# Patient Record
Sex: Male | Born: 1935 | Race: White | Hispanic: No | Marital: Married | Smoking: Former smoker
Health system: Southern US, Community
[De-identification: ages and names within clinical notes are randomized; demographics above are authoritative.]

## PROBLEM LIST (undated history)

## (undated) DIAGNOSIS — I499 Cardiac arrhythmia, unspecified: Secondary | ICD-10-CM

## (undated) DIAGNOSIS — H3530 Unspecified macular degeneration: Secondary | ICD-10-CM

## (undated) DIAGNOSIS — E1140 Type 2 diabetes mellitus with diabetic neuropathy, unspecified: Secondary | ICD-10-CM

## (undated) DIAGNOSIS — G250 Essential tremor: Secondary | ICD-10-CM

## (undated) DIAGNOSIS — M5136 Other intervertebral disc degeneration, lumbar region: Secondary | ICD-10-CM

## (undated) DIAGNOSIS — I639 Cerebral infarction, unspecified: Secondary | ICD-10-CM

## (undated) DIAGNOSIS — K5790 Diverticulosis of intestine, part unspecified, without perforation or abscess without bleeding: Secondary | ICD-10-CM

## (undated) DIAGNOSIS — K5909 Other constipation: Secondary | ICD-10-CM

## (undated) DIAGNOSIS — G464 Cerebellar stroke syndrome: Secondary | ICD-10-CM

## (undated) DIAGNOSIS — E782 Mixed hyperlipidemia: Secondary | ICD-10-CM

## (undated) DIAGNOSIS — E119 Type 2 diabetes mellitus without complications: Secondary | ICD-10-CM

## (undated) DIAGNOSIS — N400 Enlarged prostate without lower urinary tract symptoms: Secondary | ICD-10-CM

## (undated) DIAGNOSIS — I872 Venous insufficiency (chronic) (peripheral): Secondary | ICD-10-CM

## (undated) DIAGNOSIS — I509 Heart failure, unspecified: Secondary | ICD-10-CM

## (undated) DIAGNOSIS — A0472 Enterocolitis due to Clostridium difficile, not specified as recurrent: Secondary | ICD-10-CM

## (undated) DIAGNOSIS — I10 Essential (primary) hypertension: Secondary | ICD-10-CM

## (undated) HISTORY — DX: Other constipation: K59.09

## (undated) HISTORY — DX: Essential (primary) hypertension: I10

## (undated) HISTORY — DX: Venous insufficiency (chronic) (peripheral): I87.2

## (undated) HISTORY — DX: Mixed hyperlipidemia: E78.2

## (undated) HISTORY — DX: Type 2 diabetes mellitus without complications: E11.9

## (undated) HISTORY — DX: Diverticulosis of intestine, part unspecified, without perforation or abscess without bleeding: K57.90

## (undated) HISTORY — PX: APPENDECTOMY: SHX54

## (undated) HISTORY — PX: HEMORRHOID SURGERY: SHX153

## (undated) HISTORY — PX: COLONOSCOPY: SHX174

## (undated) HISTORY — DX: Unspecified macular degeneration: H35.30

## (undated) HISTORY — DX: Enterocolitis due to Clostridium difficile, not specified as recurrent: A04.72

## (undated) HISTORY — DX: Type 2 diabetes mellitus with diabetic neuropathy, unspecified: E11.40

## (undated) HISTORY — DX: Essential tremor: G25.0

## (undated) HISTORY — PX: HERNIA REPAIR: SHX51

## (undated) HISTORY — PX: SIGMOIDOSCOPY: SUR1295

---

## 2002-09-20 ENCOUNTER — Other Ambulatory Visit: Admission: RE | Admit: 2002-09-20 | Discharge: 2002-09-20 | Payer: Self-pay | Admitting: Dermatology

## 2002-12-26 ENCOUNTER — Other Ambulatory Visit: Admission: RE | Admit: 2002-12-26 | Discharge: 2002-12-26 | Payer: Self-pay | Admitting: Dermatology

## 2003-10-02 ENCOUNTER — Other Ambulatory Visit: Admission: RE | Admit: 2003-10-02 | Discharge: 2003-10-02 | Payer: Self-pay | Admitting: Dermatology

## 2005-01-07 ENCOUNTER — Other Ambulatory Visit: Admission: RE | Admit: 2005-01-07 | Discharge: 2005-01-07 | Payer: Self-pay | Admitting: Dermatology

## 2007-10-11 ENCOUNTER — Ambulatory Visit (HOSPITAL_COMMUNITY): Admission: RE | Admit: 2007-10-11 | Discharge: 2007-10-11 | Payer: Self-pay | Admitting: Family Medicine

## 2007-11-21 ENCOUNTER — Ambulatory Visit: Payer: Self-pay | Admitting: Gastroenterology

## 2007-11-23 ENCOUNTER — Ambulatory Visit (HOSPITAL_COMMUNITY): Admission: RE | Admit: 2007-11-23 | Discharge: 2007-11-23 | Payer: Self-pay | Admitting: Gastroenterology

## 2007-12-21 ENCOUNTER — Ambulatory Visit: Payer: Self-pay | Admitting: Gastroenterology

## 2007-12-28 ENCOUNTER — Ambulatory Visit (HOSPITAL_COMMUNITY): Admission: RE | Admit: 2007-12-28 | Discharge: 2007-12-28 | Payer: Self-pay | Admitting: Family Medicine

## 2008-01-19 ENCOUNTER — Ambulatory Visit: Payer: Self-pay | Admitting: Gastroenterology

## 2008-12-16 DIAGNOSIS — A0472 Enterocolitis due to Clostridium difficile, not specified as recurrent: Secondary | ICD-10-CM

## 2008-12-16 HISTORY — DX: Enterocolitis due to Clostridium difficile, not specified as recurrent: A04.72

## 2008-12-26 ENCOUNTER — Ambulatory Visit: Payer: Self-pay | Admitting: Gastroenterology

## 2009-01-02 ENCOUNTER — Ambulatory Visit: Payer: Self-pay | Admitting: Gastroenterology

## 2009-01-02 ENCOUNTER — Encounter: Payer: Self-pay | Admitting: Gastroenterology

## 2009-01-02 ENCOUNTER — Ambulatory Visit (HOSPITAL_COMMUNITY): Admission: RE | Admit: 2009-01-02 | Discharge: 2009-01-02 | Payer: Self-pay | Admitting: Gastroenterology

## 2009-01-03 ENCOUNTER — Encounter: Payer: Self-pay | Admitting: Gastroenterology

## 2009-01-03 ENCOUNTER — Telehealth (INDEPENDENT_AMBULATORY_CARE_PROVIDER_SITE_OTHER): Payer: Self-pay

## 2009-01-17 ENCOUNTER — Encounter: Payer: Self-pay | Admitting: Gastroenterology

## 2009-02-10 ENCOUNTER — Encounter: Payer: Self-pay | Admitting: Gastroenterology

## 2009-03-18 ENCOUNTER — Ambulatory Visit: Payer: Self-pay | Admitting: Gastroenterology

## 2009-08-27 ENCOUNTER — Ambulatory Visit: Payer: Self-pay | Admitting: Gastroenterology

## 2010-01-13 ENCOUNTER — Ambulatory Visit: Payer: Self-pay | Admitting: Gastroenterology

## 2010-01-14 ENCOUNTER — Encounter: Payer: Self-pay | Admitting: Gastroenterology

## 2010-01-21 ENCOUNTER — Encounter (HOSPITAL_COMMUNITY): Admission: RE | Admit: 2010-01-21 | Discharge: 2010-02-20 | Payer: Self-pay | Admitting: Gastroenterology

## 2010-01-27 ENCOUNTER — Ambulatory Visit (HOSPITAL_COMMUNITY): Admission: RE | Admit: 2010-01-27 | Discharge: 2010-01-27 | Payer: Self-pay | Admitting: Gastroenterology

## 2010-01-27 ENCOUNTER — Ambulatory Visit: Payer: Self-pay | Admitting: Gastroenterology

## 2010-02-24 ENCOUNTER — Ambulatory Visit: Payer: Self-pay | Admitting: Gastroenterology

## 2010-02-24 ENCOUNTER — Encounter (INDEPENDENT_AMBULATORY_CARE_PROVIDER_SITE_OTHER): Payer: Self-pay | Admitting: *Deleted

## 2010-03-03 ENCOUNTER — Encounter: Payer: Self-pay | Admitting: Gastroenterology

## 2010-10-26 ENCOUNTER — Encounter: Payer: Self-pay | Admitting: Gastroenterology

## 2010-11-04 ENCOUNTER — Ambulatory Visit: Payer: Self-pay | Admitting: Internal Medicine

## 2010-12-17 NOTE — Assessment & Plan Note (Signed)
Summary: DIFFICULTY PASSING STOOL, RECTAL BLEEDING   Visit Type:  Follow-up Visit Primary Care Provider:  Simone Curia  Chief Complaint:  abd pain and blood in stool.  History of Present Illness: Pt initially seen JAN 2009 for long standing constipation, which was more difficult to manage with Metamucil, prunes, and laxatives. CMP/CBC NL in 2008. CTA/P w/ivc-DIVERTICULOSIS, nl stool volume. TSH NL 2009. Single contrast BE 2009-SIGMOID REDUNDANCY, AND DIVERTICULOSIS. Pt declined TCS. Added Benefiber and miralax, no change. Tried Amitiza 8 two times a day, no change. Increased to 24 two times a day, no change. 2010: Pt agreed to flex sig/? TCS-diverticulosis/?colitis. Biopsies showed colitis. CDIFF pos AFTER BEING ON CEFTIN, routine stool Cx neg. Rx for CDIFF-FLAG x 10 days.   Today pain in belly and strains and has bleeding in stool with BMs. Does feel hemorrhoids coming out. No pain in belly right now. Can have pains intermittently on the left or right. Stool soft but it just won't drop down. Causes him to feel nauseous and to have indigestion. Taking Miralax and benefiber three times a day.  Current Medications (verified): 1)  Polyethylene Glycol 3350  Powd (Polyethylene Glycol 3350) .Marland KitchenMarland KitchenMarland Kitchen 17 Grams By Mouth Tid 2)  Lovastatin 40 Mg Tabs (Lovastatin) .... Once Daily 3)  Allopurinol 300 Mg Tabs (Allopurinol) .... Once Daily 4)  Furosemide 40 Mg Tabs (Furosemide) .... Once Daily 5)  Atenolol 50 Mg Tabs (Atenolol) .... Once Daily 6)  Plavix 75 Mg Tabs (Clopidogrel Bisulfate) .... Once Daily 7)  Etodolac 300 Mg Caps (Etodolac) .... Once Daily 8)  Benefiber  Powd (Wheat Dextrin) .... 2 Tsp Po Tid 9)  Prilosec 20 Mg Cpdr (Omeprazole) .Marland Kitchen.. 1 By Mouth Every Morning 10)  Asa 81 Mg .... Take 1 Tablet By Mouth Once A Day 11)  Terazosin Hcl 5 Mg Caps (Terazosin Hcl) .... Take 1 Tablet By Mouth Once A Day 12)  I-Caps .... Two Tablets Daily  Allergies (verified): No Known Drug Allergies  Past  History:  Past Medical History: Last updated: 03/18/2009 Diverticulosis Hyperlipidemia Gout Constipation  Past Surgical History: Last updated: 08/26/2009 Hernia repair Hemorrhoidectomy Appendectomy  Vital Signs:  Patient profile:   77 year old male Height:      75 inches Weight:      199 pounds Temp:     97.7 degrees F oral Pulse rate:   72 / minute BP sitting:   166 / 78  (left arm) Cuff size:   regular  Vitals Entered By: Cloria Spring LPN (January 14, 6432 4:00 PM)  Physical Exam  General:  Well developed, well nourished, no acute distress. Head:  Normocephalic and atraumatic. Eyes:  PERRLA, no icterus. Mouth:  No deformity or lesions. Neck:  Supple; no masses. Lungs:  Clear throughout to auscultation. Heart:  Regular rate and rhythm; no murmurs, rubs,  or bruits.  Impression & Recommendations:  Problem # 1:  CONSTIPATION (ICD-564.00)  Differential diagnosis includes pelvic floor dysfunction, or rectal prolapse, less likely sigmoid volvulus. Flex SIG MAR 15. WILL CHECK FOR RECTAL PROLAPSE AT THAT TIME. Need SITZ MARKER study and ARM/BET. May need biofeedback. Continue Benefiber and Miralax three times a day. Needs flex sig for rectal bleeding. OPV in 6 weeks.  cc: pcp  Orders: Est. Patient Level V (29518)  Patient Instructions: 1)  Continue Miralax and Benefiber three times a day. 2)  Lower endoscopy at [facility] on MAR 15. 3)  Will obtain Sitz Marker Study at [facility]. 4)  Obtain Anorectal manometry to measure  anal relaxation at [facility]. 5)  Return visit in 6 weeks. 6)  The medication list was reviewed and reconciled.  All changed / newly prescribed medications were explained.  A complete medication list was provided to the patient / caregiver.

## 2010-12-17 NOTE — Assessment & Plan Note (Signed)
Summary: FU/HAVING TROUBLE GOING TO THE BATHROOM/SS   Visit Type:  Follow-up Visit Primary Care Provider:  Lubertha South  CC:  F/U constipaton.  History of Present Illness: [name] presents today in f/u for chronic constipation. He has had a thorough work-up including recent colonoscopy in March 2011, sitz marker study showing normal colonic transit, and anorectal manometry at [facility] with recommendations to pursue biofeedback. Pt is not interested in this at this time. He reports that he occasionally feels bloated, has 4-5 very small BMs daily, about a "spoonful", has to strain. Uses newspaper to relax which helps. He is using fiber and miralax three times/day. He was on Amitiza in the past, but this has not been filled since February. He is unsure why he stopped this. He denies any other issues at this time. Still not happy with BMs.  Current Medications (verified): 1)  Polyethylene Glycol 3350  Powd (Polyethylene Glycol 3350) .Marland KitchenMarland KitchenMarland Kitchen 17 Grams By Mouth Tid 2)  Lovastatin 40 Mg Tabs (Lovastatin) .... Once Daily 3)  Allopurinol 300 Mg Tabs (Allopurinol) .... Once Daily 4)  Furosemide 40 Mg Tabs (Furosemide) .... Once Daily 5)  Atenolol 50 Mg Tabs (Atenolol) .... Once Daily 6)  Plavix 75 Mg Tabs (Clopidogrel Bisulfate) .... Once Daily 7)  Etodolac 300 Mg Caps (Etodolac) .... Twice Daily 8)  Benefiber  Powd (Wheat Dextrin) .... 2 Tsp Po Tid 9)  Asa 81 Mg .... Take 1 Tablet By Mouth Once A Day 10)  Terazosin Hcl 5 Mg Caps (Terazosin Hcl) .... Take 1 Tablet By Mouth Once A Day 11)  I-Caps .... Two Tablets Daily 12)  Amitiza 24 Mcg Caps (Lubiprostone) .... Take 1 Tablet By Mouth Once A Day  Allergies (verified): No Known Drug Allergies  Past History:  Past Medical History: Last updated: 03/18/2009 Diverticulosis Hyperlipidemia Gout Constipation  Past Surgical History: Last updated: 08/26/2009 Hernia repair Hemorrhoidectomy Appendectomy  Review of Systems General:  Denies fever,  chills, and anorexia. Eyes:  Denies blurring, irritation, and discharge. ENT:  Denies sore throat, hoarseness, and difficulty swallowing. CV:  Denies chest pains and syncope. Resp:  Denies dyspnea at rest and wheezing. GI:  Complains of constipation; denies difficulty swallowing, pain on swallowing, nausea, indigestion/heartburn, abdominal pain, change in bowel habits, bloody BM's, and black BMs. GU:  Denies urinary burning and urinary frequency. MS:  Denies joint pain / LOM, joint swelling, and joint stiffness. Derm:  Denies rash, itching, and dry skin. Neuro:  Denies weakness and syncope. Psych:  Denies depression and anxiety.  Vital Signs:  Patient profile:   77 year old male Height:      75 inches Weight:      195 pounds BMI:     24.46 Temp:     98.7 degrees F oral Pulse rate:   68 / minute BP sitting:   170 / 78  (left arm) Cuff size:   large  Vitals Entered By: Cloria Spring LPN (November 04, 2010 1:56 PM)  Physical Exam  General:  Well developed, well nourished, no acute distress. Head:  Normocephalic and atraumatic. Eyes:  sclera without icterus Mouth:  No deformity or lesions, dentition normal. Lungs:  Clear throughout to auscultation. Heart:  Regular rate and rhythm; no murmurs, rubs,  or bruits. Abdomen:  soft, +BS, NT, ND, no HSM Msk:  Symmetrical with no gross deformities. Normal posture. Neurologic:  Alert and  oriented x4;  grossly normal neurologically.  Impression & Recommendations:  Problem # 1:  CONSTIPATION (ICD-72.25)  77 year old pleasant  male with chronic constipation, has undergone thorough work-up including referral to [facility] for anorectal manometry. Recommendations were for biofeedback, which pt is not interested in at this time. Was on Amitiza in past. Willing to try again.    Amitiza by mouth two times a day, instructed to take with food, #14 samples given. Informed caregiver on instructions. Will give progress report in a few weeks. Will  call in rx if this seems to help Return in 3 mos for reassessment.  Orders: Est. Patient Level II ([address])  Appended Document: FU/HAVING TROUBLE GOING TO THE BATHROOM/SS can we have a progress report on pt regarding bowel movements. Gave samples of amitiza twice/day.   Appended Document: FU/HAVING TROUBLE GOING TO THE BATHROOM/SS Called pt's neice, [name]. (Pt is hard of hearing). She is going to check with him and let me kow.  Appended Document: FU/HAVING TROUBLE GOING TO THE BATHROOM/SS Per neice, [name], pt said the Amitiza helped alot. He even had to back off some, but it definitely helped. He would like to get Rx sent to [facility] and have it delivered.   Appended Document: FU/HAVING TROUBLE GOING TO THE BATHROOM/SS    Prescriptions: AMITIZA 8 MCG CAPS (LUBIPROSTONE) take 1 by mouth two times a day with food  #60 x 3   Entered and Authorized by:   Gerrit Halls NP   Signed by:   Gerrit Halls NP on 11/30/2010   Method used:   Faxed to ...       [facility]* (retail)       [address]       [place], [place]  [address]       Ph: [phone]       Fax: [phone]   RxID:   [id number]

## 2010-12-17 NOTE — Assessment & Plan Note (Signed)
Summary: CONSTIPATION,RECTAL BLEEDING./GU   Visit Type:  Follow-up Visit Primary Care Provider:  Lubertha South  Chief Complaint:  constipation/rectal bleeding.  History of Present Illness: Patient is here for f/u. He had anorectal manometry last week at [facility] but we do not have the results yet. He is currently taking Miralax and Benefiber three times a day. He has several small soft BMs per day. He states he continues to have to strain. Denies brbpr. He is still dissatisfied with his bowel function. Denies abd pain, anorexia, heartburn, n/v, melena, diarrhea.  Recent Flex sig in 3/11. No rectal prolapse. Weak anal sphincter. Retained stool. Frequent sigmoid colon and descending colon diverticula seen. Unable to appreciate any masses.  Polyps less than 1 cm may have been missed.  No inflammatory changes. Small-to-moderate internal hemorrhoids.  Otherwise, normal retroflex view of the rectum.  He had normal sitz marker study showing normal colon transit.  Current Medications (verified): 1)  Polyethylene Glycol 3350  Powd (Polyethylene Glycol 3350) .Marland KitchenMarland KitchenMarland Kitchen 17 Grams By Mouth Tid 2)  Lovastatin 40 Mg Tabs (Lovastatin) .... Once Daily 3)  Allopurinol 300 Mg Tabs (Allopurinol) .... Once Daily 4)  Furosemide 40 Mg Tabs (Furosemide) .... Once Daily 5)  Atenolol 50 Mg Tabs (Atenolol) .... Once Daily 6)  Plavix 75 Mg Tabs (Clopidogrel Bisulfate) .... Once Daily 7)  Etodolac 300 Mg Caps (Etodolac) .... Twice Daily 8)  Benefiber  Powd (Wheat Dextrin) .... 2 Tsp Po Tid 9)  Asa 81 Mg .... Take 1 Tablet By Mouth Once A Day 10)  Terazosin Hcl 5 Mg Caps (Terazosin Hcl) .... Take 1 Tablet By Mouth Once A Day 11)  I-Caps .... Two Tablets Daily  Allergies (verified): No Known Drug Allergies  Review of Systems      See HPI  Vital Signs:  Patient profile:   77 year old male Weight:      198 pounds Temp:     97.7 degrees F oral Pulse rate:   64 / minute BP sitting:   122 / 60  Vitals Entered By: Leanna Battles. Nyair Depaulo PA-C (February 24, 2010 11:29 AM)  Physical Exam  General:  Well developed, well nourished, no acute distress. Head:  Normocephalic and atraumatic. Eyes:  Sclera nonicteric. Mouth:  OP moist. Lungs:  Clear throughout to auscultation. Heart:  Regular rate and rhythm; no murmurs, rubs,  or bruits. Abdomen:  Bowel sounds normal.  Abdomen is soft, nontender, nondistended.  No rebound or guarding.  No hepatosplenomegaly, masses or hernias.  No abdominal bruits.  Extremities:  No clubbing, cyanosis, edema or deformities noted. Neurologic:  Alert and  oriented x4;  grossly normal neurologically. Skin:  Intact without significant lesions or rashes. Psych:  Alert and cooperative. Normal mood and affect.  Impression & Recommendations:  Problem # 1:  CONSTIPATION (ICD-564.00)  W/U as outlined above. Await anorectal manometry study results. Continue Benefiber and Miralax three times a day.  OV with Dr. Darrick Penna in three months. Further recommendations to follow after pending results reviewed.  Patient asked that we call his niece, Ander Slade, with results given he is hard of hearing. Phone number listed under patient demographics.  Orders: Est. Patient Level II ([address]38)    Appended Document: CONSTIPATION,RECTAL BLEEDING./GU Spoke with pt and his niece: [phone]. Pt would like to hold off on biofeedback for now. OPV JUL 15. Will readdress BMs at that time.

## 2010-12-17 NOTE — Letter (Signed)
Summary: SITZ MARKER STUDY ORDER  SITZ MARKER STUDY ORDER   Imported By: Ave Filter 01/13/2010 17:00:15  _____________________________________________________________________  External Attachment:    Type:   Image     Comment:   External Document

## 2010-12-17 NOTE — Letter (Signed)
Summary: FLEX SIG ORDER  FLEX SIG ORDER   Imported By: Ave Filter 01/13/2010 16:59:24  _____________________________________________________________________  External Attachment:    Type:   Image     Comment:   External Document

## 2010-12-17 NOTE — Letter (Signed)
Summary: [facility] REFERRAL  [facility] REFERRAL   Imported By: Ave Filter 01/14/2010 10:39:32  _____________________________________________________________________  External Attachment:    Type:   Image     Comment:   External Document

## 2010-12-17 NOTE — Letter (Signed)
Summary: ANORECTAL MANOMETRY-[facility]  ANORECTAL MANOMETRY-[facility]   Imported By: Ave Filter 03/03/2010 14:48:10  _____________________________________________________________________  External Attachment:    Type:   Image     Comment:   External Document

## 2010-12-17 NOTE — Medication Information (Signed)
Summary: POLYETHYLENE GLYCOL  POLYETHYLENE GLYCOL   Imported By: Rexene Alberts 12020/05/1410 09:57:51  _____________________________________________________________________  External Attachment:    Type:   Image     Comment:   External Document  Appended Document: POLYETHYLENE GLYCOL    Prescriptions: POLYETHYLENE GLYCOL 3350  POWD (POLYETHYLENE GLYCOL 3350) 17 grams by mouth tid  #1 month x 1   Entered and Authorized by:   Gerrit Halls NP   Signed by:   Gerrit Halls NP on 12020/05/1410   Method used:   Faxed to ...       [facility] (retail)       [address]       [place], [place]  [address]       Ph: [phone]       Fax: [phone]   RxID:   [id number]    Please have pt follow-up in office. Was supposed to be seen in July. Gave him one refill for now, needs office visit.   Appended Document: POLYETHYLENE GLYCOL Called and LMOM for pt's neice, [name], to call and schedule an appt.

## 2010-12-17 NOTE — Letter (Signed)
Summary: Scheduled Appointment  [facility]  [address]   [place], [place] [address]   Phone: [phone]  Fax: [phone]    February 24, 2010   Dear: Shella Maxim            DOB: [birth year]    I have been instructed to schedule you an appointment in our office.  Your appointment is as follows:   Date:  May 27, 2010   Time:   9:30AM    Please be here 15 minutes early.   Provider:   DR FIELDS    Please contact the office if you need to reschedule this appointment for a more convenient time.   Thank you,    Diana Eves       [facility] Ph: [phone]   Fax: [phone]

## 2011-01-21 ENCOUNTER — Encounter (INDEPENDENT_AMBULATORY_CARE_PROVIDER_SITE_OTHER): Payer: Self-pay | Admitting: *Deleted

## 2011-01-26 NOTE — Letter (Signed)
Summary: Recall Office Visit  [facility]  [address]   [name], [place] [address]   Phone: [phone]  Fax: [phone]      January 21, 2011   Christian Thomas [address] [place], [place]  [address] 10/04/1915   Dear Christian Thomas,   According to our records, it is time for you to schedule a follow-up office visit with [place].   At your convenience, please call [phone] to schedule an office visit. If you have any questions, concerns, or feel that this letter is in error, we would appreciate your call.   Sincerely,    Diana Eves  [facility] Ph: [phone]   Fax: [phone]

## 2011-03-02 LAB — CLOSTRIDIUM DIFFICILE EIA

## 2011-03-02 LAB — STOOL CULTURE

## 2011-03-11 ENCOUNTER — Encounter: Payer: Self-pay | Admitting: Gastroenterology

## 2011-03-11 ENCOUNTER — Ambulatory Visit: Payer: Self-pay | Admitting: Gastroenterology

## 2011-03-11 ENCOUNTER — Ambulatory Visit (INDEPENDENT_AMBULATORY_CARE_PROVIDER_SITE_OTHER): Payer: Medicare Other | Admitting: Gastroenterology

## 2011-03-11 DIAGNOSIS — K5900 Constipation, unspecified: Secondary | ICD-10-CM

## 2011-03-11 MED ORDER — LUBIPROSTONE 8 MCG PO CAPS: 8.0000 ug | ORAL_CAPSULE | Freq: Two times a day (BID) | ORAL | Status: DC

## 2011-03-11 NOTE — Progress Notes (Signed)
  Subjective:    Patient ID: Christian Thomas, male    DOB: 25-May-1915, 77 y.o.   MRN: [id number]  PCP: Gerda Diss  HPI  Bowels been doing fair. Wants to know if he can take it with his coffee. Puts 2 of medicines in his coffee. Amitiza does a better if he takes it with coffee.   Past Medical History  Diagnosis Date  . Diverticulosis   . Hyperlipemia   . Gout   . C. difficile colitis FEB 2010  . Chronic constipation DECLINED BIOFEEDBACK    SITZ MARKER NL;  PF DYSFUNCTION- ARM/BET   Past Surgical History  Procedure Date  . Hernia repair   . Hemorrhoid surgery   . Appendectomy   . Colonoscopy FEB 2010    NL TI, pan-TICS, sml IH, CDIFF COLITIS  . Sigmoidoscopy MAR 2011 BRBPR    IH       Review of Systems APR 2011 198 LBS    Objective:   Physical Exam  Constitutional: He appears well-developed and well-nourished. No distress.  Cardiovascular: Normal rate and regular rhythm.   Pulmonary/Chest: Effort normal and breath sounds normal.  Abdominal: Soft. Bowel sounds are normal. There is no tenderness.          Assessment & Plan:

## 2011-03-17 ENCOUNTER — Encounter: Payer: Self-pay | Admitting: Gastroenterology

## 2011-03-17 NOTE — Assessment & Plan Note (Signed)
IMPROVED.  CONTINUE AMITIZA. OPV IN 6 MOS.

## 2011-03-18 NOTE — Progress Notes (Signed)
Pt is aware of his OV on 10/25 at 0845 with [facility]

## 2011-03-18 NOTE — Progress Notes (Signed)
Cc to PCP 

## 2011-03-19 ENCOUNTER — Other Ambulatory Visit: Payer: Self-pay | Admitting: Gastroenterology

## 2011-03-30 NOTE — Consult Note (Signed)
Christian Thomas, Christian Thomas               ACCOUNT NO.:  [id number]   MEDICAL RECORD NO.:  [id number]         PATIENT TYPE:  PAMB   LOCATION:  DAY                           FACILITY:  [facility]   PHYSICIAN:  Kassie Mends, M.D.           DATE OF BIRTH:   DATE OF CONSULTATION:  12/26/2008  DATE OF DISCHARGE:                                 CONSULTATION   REFERRING Zoe Goonan:  Lorin Picket A. Gerda Diss, MD   PROBLEM LIST:  1. Constipation with rectal urgency causing frequent solid stools and      altered bowel habits.  2. Diverticulosis, severe.  3. Gout.  4. Hyperlipidemia.  5. Tremors.  6. Hernia.   SUBJECTIVE:  Christian Thomas is a 77 year old male who was last seen in March  2009.  He was taking MiraLax and Benefiber twice daily.  He declined a  sigmoidoscopy at that time.  He was seen in followup and still having  difficulty with his bowel movements.  He denies any abdominal pain,  bright red blood per rectum, and his appetite is good.  He has gained 7  pounds since March 2007.   MEDICATIONS:  1. Terazosin.  2. Lovastatin.  3. Allopurinol 300 mg daily.  4. Lasix  5. Atenolol.  6. MiraLax twice a day.  7. Tylenol and Aleve as needed.  8. Plavix 75 mg daily.  9. Etodolac 300 mg daily.  10.Keflex 500 mg twice a day.  11.ICaps twice daily or 2 daily.   OBJECTIVE:  VITAL SIGNS:  Weight 201 pounds, height 6 feet 3 inches, BMI  25.1 (slightly overweight), temperature 97.6, blood pressure 120/78, and  pulse 16.  GENERAL:  He is in no apparent distress, alert, and oriented x4.HEENT:  Atraumatic and normocephalic.  Mouth, no oral lesions.  Posterior  pharynx without erythema or exudate.NECK:  Full range of motion.  No  lymphadenopathy.LUNGS:  Clear to auscultation  bilaterally.CARDIOVASCULAR:  Regular rhythm.  No murmur.  Normal S1 and  S2.ABDOMEN:  Bowel sounds are present.  Soft, nontender, and  nondistended.  No rebound or guarding.NEUROLOGIC:  No gross neurologic  deficits.   ASSESSMENT:  Mr.  Thomas is a 77 year old male who had a barium enema in  January 2009, that showed severe diverticular changes and sigmoid  redundancy with overlap of opacified bowel limiting the evaluation of  the sigmoid colon.  He declined a sigmoidoscopy at that time, but he was  willing to have the procedure done at this time.  Thank you for allowing  me to see Christian Thomas in consultation.  My recommendations follow.   RECOMMENDATIONS:  1. Will schedule a sigmoidoscopy next week with clear liquids      beginning with lunch and Fleet enema prep.  2. Will add Amitiza 24 mcg b.i.d.  3. He was cautioned that the addition of Amitiza could cause loose      stools.  If that occurs he should decrease his MiraLax.  4. He has a followup appointment to see me in 2 months.  5. If adenomatous polyps are seen on Christian Thomas sigmoidoscopy, then  he      will need a complete colonoscopy.  The sigmoidoscopy will be      performed with the pediatric colonoscope.      Kassie Mends, M.D.  Electronically Signed     SM/MEDQ  D:  12/26/2008  T:  12/26/2008  Job:  [id number]   cc:   Lorin Picket A. Gerda Diss, MD  Fax: [phone]

## 2011-03-30 NOTE — Assessment & Plan Note (Signed)
Christian Thomas, Christian Thomas                  CHART#:  [id number]   DATE:  01/19/2008                       DOB:  02-25-1915   REFERRING Savior Himebaugh:  Lorin Picket A. Gerda Diss, M.D.   PROBLEM LIST:  1. Long-standing history of constipation with rectal urgency causing      frequent solid stools.  2. Diverticulosis likely contributing to number one.  3. Gout.  4. Hyperlipidemia.  5. Tremors.  6. Hernia.   SUBJECTIVE:  Christian Thomas is a 77 year old who presents as a return  patient visit.  Christian Thomas is still not moving his bowels right.  Christian Thomas is using  MiraLax twice a day and Benefiber twice a day.  Christian Thomas is still having to  strain to get the stool out several times a day.  Christian Thomas does not have a lot  of stool that comes out each time.  The stools are soft.  Christian Thomas has not  seen any blood.  Christian Thomas has a bowel movement three times a day.  Two to  three more time Christian Thomas will have the urge to go to the bathroom but nothing  happens.  Christian Thomas denies any abdominal pain.  Christian Thomas had a light stroke since I  last saw him.   OBJECTIVE:  VITAL SIGNS:  Weight 194 pounds (down 6 pounds since  February 2009), height 6 feet 3 inches, temperature 97.8, blood pressure  142/80, pulse 90.  GENERAL:  Christian Thomas is in no apparent distress.  Alert and oriented x4.  LUNGS:  Clear to auscultation bilaterally.CARDIOVASCULAR:  Regular  rhythm.ABDOMEN:  Bowel sounds are present.  Soft, nontender,  nondistended.   ASSESSMENT:  Christian Thomas is a 77 year old male who has a long-standing  history of constipation and now has rectal urgency which is likely  secondary to a redundant sigmoid colon and multiple diverticula. Thank  you for allowing me to see Christian Thomas in consultation.  My  recommendations follow.   RECOMMENDATIONS:  1. We will add Amitiza 8 mcg twice a day.  Christian Thomas was given #6 samples and      asked to call me if Christian Thomas feels like the Amitiza is helping with his      symptoms.  2. Christian Thomas should continue the MiraLax and the Benefiber twice a day.  3. I discussed the  benefits versus the risks of flexible sigmoidoscopy      with Christian Thomas to investigate his sigmoid colon.  Christian Thomas has declined      for right now.  4. Christian Thomas has a follow up appointment to see me in 6 months or sooner if      needed.  5. Christian Thomas is given his discharge instructions in writing.       Kassie Mends, M.D.  Electronically Signed     SM/MEDQ  D:  01/22/2008  T:  01/22/2008  Job:  [id number]   cc:   Lorin Picket A. Gerda Diss, MD

## 2011-03-30 NOTE — Op Note (Signed)
NAMEBURNETTE, SAUTTER               ACCOUNT NO.:  [id number]   MEDICAL RECORD NO.:  [id number]          PATIENT TYPE:  AMB   LOCATION:  DAY                           FACILITY:  [facility]   PHYSICIAN:  Kassie Mends, M.D.      DATE OF BIRTH:  1914/11/26   DATE OF PROCEDURE:  01/02/2009  DATE OF DISCHARGE:                               OPERATIVE REPORT   REFERRING PHYSICIAN:  Scott A. Gerda Diss, MD   PROCEDURE:  Ileocolonoscopy with cold forceps biopsies.   INDICATION FOR EXAM:  [name] is a 77 year old male who presented in  January 2009 complaining of constipation.  He was having small caliber  stools despite of taking laxatives and prunes.  He denied any rectal  bleeding.  He complained of increased abdominal discomfort if he is  unable to have a bowel movement.  He declined a colonoscopy, but did  have a double-contrast barium enema which showed redundant sigmoid colon  and no evidence of polyps or mass.  His thyroid was normal.  He was seen  in followup and was complaining of rectal urgency and frequent solid  stools.  He was complaining of some occasional pain around his navel in  the left lower quadrant.  After the addition of MiraLax and Benefiber,  he complained of still having a strain to get his stools out.  He was  denying abdominal pain.  He was placed on Amitiza.  He was seen a year  later and again he was complaining of difficulty having bowel movements.  He denied abdominal pain or bright red blood per rectum.  Amitiza was  added and he was scheduled for a colonoscopy on today to evaluate the  left colon for any evidence of mass, stricture, or obstruction.   FINDINGS:  1. Normal rectum, the first 10 cm of the rectum are normal.  The colon      began to have patchy erythema with frequent ulcerations with      overlying exudate seen beginning 10 cm from the anal verge and      extending to the cecum.  The ulcerations are more pronounced in the      left colon.  The views of  the cecum were somewhat obscured by      retained stool.  He did appear to have some evidence of      pseudomembranes in the cecum.  Biopsies were obtained of these      ulcerated areas to evaluate for C. diff colitis, inflammatory bowel      disease, or infectious gastroenteritis, and the differential      diagnosis also includes ischemic colitis.  2. Frequent diverticula were seen throughout the colon, but most      pronounced and most numerous in the left colon.  3. Small internal hemorrhoids.  Otherwise, normal retroflexed view of      the rectum.  4. No polyps, masses, or AVMs seen in the colon.  5. Normal terminal ileum, approximately 10 cm visualized.   DIAGNOSES:  1. Initially, I thought the findings in the rectum were prep artifact,  but since they extend to the cecum, doubt that is the etiology for      the changes seen on endoscopy.  2. Moderate diverticulosis.  3. Small internal hemorrhoids.  4. No source for chronic abdominal discomfort or small caliber stools      identified.   DIFFERENTIAL DIAGNOSIS:  Nonsteroidal antiinflammatory drug-induced  colitis v. CDIFF colitis.   RECOMMENDATIONS:  1. Will await the stool studies.  Stool sent for routine stool culture      and C. diff.  2. Will call Mr. Grisby with the results of his biopsies.  3. No aspirin, NSAIDs, or anti-inflammatory drugs for 3 weeks.  4. He should follow a high-fiber diet.  He was given a handout on high-      fiber diet, diverticulosis, and hemorrhoids.  5. He already has a follow up appointment to see me in April 2010.  6. He should continue the Amitiza and MiraLax.  7. He was specifically asked to avoid his etodolac and Aleve.   MEDICATIONS:  1. Demerol 50 mg IV.  2. Versed 2 mg IV.   PROCEDURE TECHNIQUE:  Physical exam was performed.  Informed consent was  obtained from the patient after explaining the benefits, risks, and  alternatives to the procedure.  The patient was connected to the  monitor  and placed in the left lateral position.  Continuous oxygen was provided  by nasal cannula and IV medicine administered through an indwelling  cannula.  After administration of sedation and rectal exam, the  patient's rectum was intubated and the scope was advanced under direct  visualization to the distal terminal ileum.  Scope was removed slowly by  carefully examining the color, texture, anatomy, and integrity of the  mucosa on the way out.  The patient was recovered in endoscopy and  discharged home in satisfactory condition.   PATH:  c DIFF COLITIS. ZO:XWRUEA 500 mg tid for 10 days. Restart  etodolac and Aleve in 3 weeks.      Kassie Mends, M.D.  Electronically Signed     SM/MEDQ  D:  01/02/2009  T:  01/03/2009  Job:  [id number]   cc:   Lorin Picket A. Gerda Diss, MD  Fax: [phone]

## 2011-03-30 NOTE — Assessment & Plan Note (Signed)
Christian Thomas, Christian Thomas                  CHART#:  [id number]   DATE:  11/21/2007                       DOB:  01/21/1915   REFERRING PHYSICIAN:  Donna Bernard, M.D.   REASON FOR VISIT:  Constipation.   HISTORY OF PRESENT ILLNESS:  Christian Thomas is a 77 year old male who has  had a longstanding history of constipation.  His constipation used to be  easily managed with Metamucil.  Over the past six months his  constipation has become more significant. He has a frequent urge to have  a bowel movement.  He may have the urge to have a bowel movement 5-6  times a day and perhaps 2 times a day a little amount of small caliber  stool comes out.  He has been taking laxatives and eating prunes and  still having to strain to have a bowel movement.  He also complains of  night sweats.  His night sweats have been worse since last summer.  He  denies any blood in his stool.  He has not had any weight loss.  He has  no cold intolerance or palpitations.  He reports having colonoscopy 15  years ago at [facility] and said he had polyps.  He does has no  rectal bleeding.  He denies any true abdominal pain but can become  uncomfortable in his abdomen if he is unable to have a bowel movement.  He denies any nausea, vomiting or heartburn.  He treats his burning  sensation in his chest, which he describes as indigestion, with Tums  which make sit better.  He has problems swallowing less than 1 time per  week.  Usually if he has problems swallowing, has problems swallowing  solids.  He felt a small bulge in his rectum after bowel movements but  does not feel like his rectum is collapsing on itself when he has a  bowel movement.  He has no history of pelvic injury.  He does not know  that his thyroid has been checked.  He did have a CT of his abdomen and  pelvis performed in 11/08 which revealed multiple diverticular without  evidence of inflammation predominantly in his sigmoid colon.  His  prostate was  also enlarged at 6 cm.   PAST MEDICAL HISTORY:  1. Central tremor.  2. Gout.  3. Venous insufficiency.  4. Diastolic dysfunction.  5. Hypertension.  6. Hyperlipidemia.   PAST SURGICAL HISTORY:  1. Hernia repair.  2. Hemorrhoidectomy.  3. Appendectomy.   ALLERGIES:  No known drug allergies.   MEDICATIONS:  1. Terazosin 5 mg daily.  2. Lovastatin.  3. Allopurinol.  4. Lasix.  5. Enalapril.  6. Atenolol.  7. Polyethylene glycol.  8. Half of Bufferin aspirin daily.   FAMILY HISTORY:  He has no family history of colon cancer or colon  polyps.   SOCIAL HISTORY:  He is married.  His wife is 26 years old.  He is  retired from [employer].  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  He quit smoking 15 years ago and when he did smoke he smoked less than a  half pack per day.  He occasionally drinks alcohol.   REVIEW OF SYSTEMS:  His notes document that he has type 2 diabetes and  diabetic neuropathy but he is not  on any medication.  His review of  systems per the HPI was all symptoms are negative.   PHYSICAL EXAMINATION:  VITALS:  Weight 198 pounds.  Height 6 feet,  3  inches, BMI 24.7 (healthy), temperature 98.3, blood pressure 132/62.  pulse 64.  GENERAL: He is in no apparent distress.  Alert and oriented x4.  HEENT:  Atraumatic, normocephalic, pupils equal and reactive to light.  Mouth normal oral lesions.  Posterior pharynx without erythema or  exudate.  His neck has full range of motion and no lymphadenopathy.  CARDIOVASCULAR:  Regular rhythm, no murmur, normal S1/S2.  ABDOMEN:  Bowel sounds are present, soft, nontender, nondistended, no  rebound or guarding.  No hepatosplenomegaly.  EXTREMITIES:  1+ pitting edema, no cyanosis.  NEURO:  He has no focal neurologic deficits.  RECTAL:  He has no palpable mass, his prostate is nontender and  enlarged.  No palpable nodules.  He has soft stool in his rectal vault  which is heme-negative.   LABORATORY DATA:  White count  8.6, hemoglobin 14.7, platelets 208,  potassium 4.2, BUN 20, creatinine 1.21, glucose 152 (70-99), calcium 90  with an albumin of 3.8 (labs from 09/2007).   ASSESSMENT:  Christian Thomas is a 77 year old male who complains of  constipation but has a CT scan which shows no evidence of acute  intraabdominal pathology. The differential diagnosis for his symptoms  include colonic motility disorder secondary to severe sigmoid  diverticulosis, previously undiagnosed irritable bowel syndrome with  constipation, hyperthyroidism, or stricture secondary to prior  diverticulitis or anti-inflammatory drugs.  He has a low likelihood of  having peritoneal carcinomatosis as an etiology for his symptoms.  Thank  you for allowing me to see Christian Thomas in consultation.  My  recommendations follows.   RECOMMENDATIONS:  1. Will schedule Christian Thomas for a double contrast barium enema.  After      the results are known, then will decide whether or not he needs a      colonoscopy or flexible sigmoidoscopy.  2. Will also check his TSH due to his constipation and his night      sweats to rule out thyroid disturbance.  3. He should continue his MiraLax.  4. He has a follow-up appointment to see me in 6 weeks.       Kassie Mends, M.D.  Electronically Signed     SM/MEDQ  D:  11/21/2007  T:  11/22/2007  Job:  [id number]   cc:   Lorin Picket A. Gerda Diss, MD

## 2011-03-30 NOTE — Assessment & Plan Note (Signed)
NAMERONIE, BARNHART                  CHART#:  [id number]   DATE:  12/21/2007                       DOB:  07-20-1915   PROBLEM LIST:  1. Rectal urgency causing frequent solid stools.  2. Diverticulosis.  3. Gout.  4. Long-standing history of constipation.  5. Hyperlipidemia.  6. Tremors.  7. Hernia.   SUBJECTIVE:  The patient is a 77 year old male who was initially seen in  January of 2009 because he was having to keep going to the bathroom a  lot.  He has the constant feeling of having a bowel movement, and when  he goes only a small amount of formed stool comes out.  His stools are  soft.  He has been taking MiraLax twice a day but did not take the  Benefiber twice a day.  His bowel movements have been about the same.  He does not have to get up in the middle of the night to have a bowel  movement.  He has some occasional pain around his navel in the left  upper quadrant and the left lower quadrant.   OBJECTIVE:  Weight 201 pounds (up 2 pounds since January of 2009),  height 6 feet 3 inches, temperature 97.3, blood pressure 168/78, pulse  56.GENERAL:  He is in no apparent distress, alert and oriented x4.LUNGS:  Clear to auscultation bilaterally.  CARDIOVASCULAR EXAM:  Regular rhythm, no murmur, normal S1 and  S2.ABDOMEN:  Bowel sounds are present, soft, nontender, nondistended.   ASSESSMENT:  The patient is a 77 year old male with rectal urgency, and  he failed to follow my instructions even though I gave them to him in  writing.  Thank you for allowing me to see the patient in consultation.  My recommendations follow.   RECOMMENDATIONS:  1. He is to continue the MiraLax twice a day and start the Benefiber      twice a day.  2. He has a follow up appointment to see me in one month.  If his      symptoms are still not improved, would consider flexible      sigmoidoscopy, although I believe its ability to add to his      management really will not be that significant.  I  believe most of      his problems are from a redundant diseased colon.       Kassie Mends, M.D.  Electronically Signed     SM/MEDQ  D:  12/21/2007  T:  12/22/2007  Job:  [id number]   cc:   Lorin Picket A. Gerda Diss, MD

## 2011-06-11 ENCOUNTER — Encounter: Payer: Self-pay | Admitting: Gastroenterology

## 2011-07-01 ENCOUNTER — Encounter: Payer: Self-pay | Admitting: Gastroenterology

## 2011-07-07 ENCOUNTER — Ambulatory Visit (INDEPENDENT_AMBULATORY_CARE_PROVIDER_SITE_OTHER): Payer: Medicare Other | Admitting: Cardiology

## 2011-07-07 ENCOUNTER — Encounter: Payer: Self-pay | Admitting: Cardiology

## 2011-07-07 VITALS — BP 160/71 | HR 67 | Resp 18 | Ht 73.0 in | Wt 193.4 lb

## 2011-07-07 DIAGNOSIS — R55 Syncope and collapse: Secondary | ICD-10-CM

## 2011-07-07 DIAGNOSIS — R9431 Abnormal electrocardiogram [ECG] [EKG]: Secondary | ICD-10-CM

## 2011-07-07 DIAGNOSIS — I10 Essential (primary) hypertension: Secondary | ICD-10-CM | POA: Insufficient documentation

## 2011-07-07 MED ORDER — ATENOLOL 25 MG PO TABS: 25.0000 mg | ORAL_TABLET | Freq: Every day | ORAL | Status: DC

## 2011-07-07 NOTE — Progress Notes (Signed)
Clinical Summary [name] is a 77 y.o.male referred for cardiology consultation by Dr. Gerda Diss. He is here with his niece. He reports a history of intermittent dizziness resulting in falls, and presumably brief syncope based on description. These have been occurring as far back as 2009 according to the patient's niece, however have been more frequent within the last 6 months.  In describing the events, he states that he suddenly gets "dizzy" as if he needs to steady himself, and will usually fall briefly, fairly quickly aware of his surroundings thereafter. He does not endorse any chest pain or palpitations with these events. Does not describe them occurring in any consistent pattern or with known precipitant.  Faxed copy of ECG from Dr. Fletcher Anon office shows sinus bradycardia at 58 beats per minute with a right bundle branch block and left anterior fascicular block. PR Interval is normal. Followup tracing today is similar.  He reports no new medications, has been on antihypertensives for quite some time.   No Known Allergies  Medication list reviewed.  Past Medical History  Diagnosis Date  . Diverticulosis   . Mixed hyperlipidemia   . Gout   . Clostridium difficile colitis   . Chronic constipation   . Essential hypertension, benign   . Type 2 diabetes mellitus   . Venous insufficiency   . Benign essential tremor   . Macular degeneration   . Diabetic neuropathy     Past Surgical History  Procedure Date  . Hernia repair   . Hemorrhoid surgery   . Appendectomy   . Colonoscopy     NL TI, pan-TICS, sml IH, CDIFF COLITIS  . Sigmoidoscopy     Internal hemorrhoids    Family History  Problem Relation Age of Onset  . Cancer Mother     Breast    Social History [name] reports that he has quit smoking. His smoking use included Cigarettes. He has never used smokeless tobacco. [name] reports that he drinks alcohol.  Review of Systems Hard of hearing, stable appetite, no  orthopnea or PND, no significant lower extremity edema. No reproducible exertional chest pain. Otherwise negative.  Physical Examination Filed Vitals:   07/07/11 1434  BP: 160/71  Pulse: 67  Resp: 18  Elderly male in no acute distress. HEENT: Conjunctiva and lids are normal, hearing aids in place, oropharynx with moist mucosa. Neck: Supple, no elevated JVP or carotid bruits, no thyromegaly. Lungs: Clear to auscultation, nonlabored. Cardiac: Regular rate and rhythm with 2/6 systolic murmur at the base, preserved second heart sound, no rub or gallop. Abdomen: Soft, nontender, bowel sounds present. Skin: Warm and dry. Musculoskeletal: Kyphosis is noted. Neuropsychiatric: Alert and oriented x3, hard of hearing, affect appropriate.   ECG Sinus bradycardia at 59 beats per minute with left anterior fascicular block and right bundle branch block, PR interval 172 ms.   Problem List and Plan

## 2011-07-07 NOTE — Assessment & Plan Note (Signed)
Right bundle branch block and left anterior fascicular block, suggestive of underlying conduction system disease.

## 2011-07-07 NOTE — Assessment & Plan Note (Signed)
Description is not consistent with orthostasis. He is hypertensive today. As noted above, atenolol is being reduced in light of concerns about symptomatic bradycardia.

## 2011-07-07 NOTE — Assessment & Plan Note (Addendum)
I think the most likely explanation for these episodes is underlying conduction system disease with symptomatic bradycardia or pauses. Baseline ECG is abnormal and suggestive of conduction system disease. He is also on a beta blocker with sinus bradycardia at rest. Arrhythmia is suggested based on the sudden and unpredictable nature of the events. We discussed this today, and plan will be to proceed with a CardioNet monitor for further objective evaluation. Echocardiogram will also be obtained to assess cardiac structure and function, particularly with heart murmur. We will also reduce atenolol to 25 mg daily, possibly discontinue it altogether if necessary. Followup is arranged.

## 2011-07-07 NOTE — Patient Instructions (Signed)
**Note De-Identified [name] Obfuscation** Your physician has recommended you make the following change in your medication: decrease Atenolol to 25 mg daily  Your physician has recommended that you wear an event monitor. Event monitors are medical devices that record the heart's electrical activity. Doctors most often [place] these monitors to diagnose arrhythmias. Arrhythmias are problems with the speed or rhythm of the heartbeat. The monitor is a small, portable device. You can wear one while you do your normal daily activities. This is usually used to diagnose what is causing palpitations/syncope (passing out).  Your physician has requested that you have an echocardiogram. Echocardiography is a painless test that uses sound waves to create images of your heart. It provides your doctor with information about the size and shape of your heart and how well your heart's chambers and valves are working. This procedure takes approximately one hour. There are no restrictions for this procedure.  Your physician recommends that you schedule a follow-up appointment in: 1 month

## 2011-07-08 DIAGNOSIS — R55 Syncope and collapse: Secondary | ICD-10-CM

## 2011-07-15 ENCOUNTER — Ambulatory Visit (HOSPITAL_COMMUNITY)
Admission: RE | Admit: 2011-07-15 | Discharge: 2011-07-15 | Disposition: A | Discharge status: Home / Self Care | Payer: Medicare Other | Source: Ambulatory Visit | Attending: Cardiology | Admitting: Cardiology

## 2011-07-15 DIAGNOSIS — R55 Syncope and collapse: Secondary | ICD-10-CM | POA: Insufficient documentation

## 2011-07-15 DIAGNOSIS — I517 Cardiomegaly: Secondary | ICD-10-CM

## 2011-07-15 NOTE — Progress Notes (Signed)
*  PRELIMINARY RESULTS* Echocardiogram 2D Echocardiogram has been performed.  Conrad Parrott 07/15/2011, 8:17 AM

## 2011-08-10 ENCOUNTER — Encounter: Payer: Self-pay | Admitting: Cardiology

## 2011-08-12 ENCOUNTER — Ambulatory Visit: Payer: PRIVATE HEALTH INSURANCE | Admitting: Cardiology

## 2011-08-12 ENCOUNTER — Encounter: Payer: Self-pay | Admitting: Cardiology

## 2011-08-12 ENCOUNTER — Ambulatory Visit (INDEPENDENT_AMBULATORY_CARE_PROVIDER_SITE_OTHER): Payer: Medicare Other | Admitting: Cardiology

## 2011-08-12 DIAGNOSIS — R55 Syncope and collapse: Secondary | ICD-10-CM

## 2011-08-12 DIAGNOSIS — I10 Essential (primary) hypertension: Secondary | ICD-10-CM

## 2011-08-12 NOTE — Assessment & Plan Note (Signed)
No changes to remaining medical therapy.

## 2011-08-12 NOTE — Patient Instructions (Signed)
**Note De-identified Imre Vecchione Obfuscation** Your physician recommends that you continue on your current medications as directed. Please refer to the Current Medication list given to you today.  Your physician recommends that you schedule a follow-up appointment in: 3 months  

## 2011-08-12 NOTE — Assessment & Plan Note (Signed)
No recurrent event since July based on discussion with the patient and family member today. He does have episodes of intermittent dizziness, and I still suspect that conduction system disease is playing a role. Plan is to clarify whether or not he has been taking any atenolol, and if in fact he has been on 25 mg daily, this will be further reduced to 12.5 mg daily. Continue observation for now with office followup arranged.

## 2011-08-12 NOTE — Progress Notes (Signed)
Clinical Summary Christian Thomas is a 77 y.o.male presenting for followup. He was seen back in August. He reports no further episodes of syncope since he was last evaluated, including when he was wearing a CardioNet monitor. Monitoring did not demonstrate any significant pauses or markedly prolonged bradycardia that would require pacemaker placement at this time. He did have both atrial and ventricular ectopy, some change in bundle branch block pattern with apparently prolonged PR interval, the rhythm being sinus. His echocardiogram was also fairly reassuring. We reviewed these results today.  Christian Thomas states that he may not have been taking any atenolol even the last time I saw him, although I cannot absolutely confirm this today in speaking with his family member. Our recommendation was to decrease him from atenolol 50 mg a day down to 25 mg a day at last visit.   No Known Allergies  Medication list reviewed.  Past Medical History  Diagnosis Date  . Diverticulosis   . Mixed hyperlipidemia   . Gout   . Clostridium difficile colitis   . Chronic constipation   . Essential hypertension, benign   . Type 2 diabetes mellitus   . Venous insufficiency   . Benign essential tremor   . Macular degeneration   . Diabetic neuropathy     Past Surgical History  Procedure Date  . Hernia repair   . Hemorrhoid surgery   . Appendectomy   . Colonoscopy     NL TI, pan-TICS, sml IH, CDIFF COLITIS  . Sigmoidoscopy     Internal hemorrhoids    Family History  Problem Relation Age of Onset  . Cancer Mother     Breast    Social History Christian Thomas reports that he has quit smoking. His smoking use included Cigarettes. He has never used smokeless tobacco. Christian Thomas reports that he drinks alcohol.  Review of Systems Hard of hearing, stable appetite. Otherwise negative except as outlined.  Physical Examination Filed Vitals:   08/12/11 1044  BP: 167/77  Pulse: 66  Resp: 18  Elderly male in no  acute distress.  HEENT: Conjunctiva and lids are normal, hearing aids in place, oropharynx with moist mucosa.  Neck: Supple, no elevated JVP or carotid bruits, no thyromegaly.  Lungs: Clear to auscultation, nonlabored.  Cardiac: Regular rate and rhythm with 2/6 systolic murmur at the base, preserved second heart sound, no rub or gallop.  Abdomen: Soft, nontender, bowel sounds present.  Skin: Warm and dry.  Musculoskeletal: Kyphosis is noted.  Neuropsychiatric: Alert and oriented x3, hard of hearing, affect appropriate.    Studies Echocardiogram 07/15/2011: - Left ventricle: The cavity size was normal. There was moderate concentric hypertrophy. Systolic function was vigorous. The estimated ejection fraction was in the range of 65% to 70%. Wall motion was normal; there were no regional wall motion abnormalities. - Aortic valve: Mildly calcified annulus. Trileaflet; mildly thickened leaflets. - Mitral valve: Calcified annulus. - Left atrium: The atrium was mildly dilated. - Right atrium: The atrium was mildly dilated. - Atrial septum: No defect or patent foramen ovale was identified. - Pulmonary arteries: PA peak pressure: 31mm Hg (S).       Problem List and Plan

## 2011-08-24 ENCOUNTER — Encounter: Payer: Self-pay | Admitting: Cardiology

## 2011-08-31 ENCOUNTER — Ambulatory Visit: Payer: PRIVATE HEALTH INSURANCE | Admitting: Gastroenterology

## 2011-08-31 ENCOUNTER — Telehealth: Payer: Self-pay | Admitting: Gastroenterology

## 2011-08-31 NOTE — Telephone Encounter (Signed)
Pt was a no show

## 2011-08-31 NOTE — Telephone Encounter (Signed)
PLEASE CALL PT TO RSC-E:30 SLOT

## 2011-09-08 ENCOUNTER — Encounter: Payer: Self-pay | Admitting: Gastroenterology

## 2011-09-08 NOTE — Telephone Encounter (Signed)
I tried to reach pt by phone, but there is no answer or voice mail.  I mailed appt card to patient for OV on 11/29 @ 2pm E30 spot with SF

## 2011-09-09 ENCOUNTER — Ambulatory Visit: Payer: PRIVATE HEALTH INSURANCE | Admitting: Gastroenterology

## 2011-09-15 ENCOUNTER — Ambulatory Visit: Payer: PRIVATE HEALTH INSURANCE | Admitting: Gastroenterology

## 2011-09-27 ENCOUNTER — Other Ambulatory Visit: Payer: Self-pay | Admitting: Family Medicine

## 2011-09-27 LAB — HEPATIC FUNCTION PANEL
ALT: 9 U/L (ref 0–53)
Bilirubin, Direct: 0.2 mg/dL (ref 0.0–0.3)
Indirect Bilirubin: 0.6 mg/dL (ref 0.0–0.9)
Total Protein: 5.8 g/dL — ABNORMAL LOW (ref 6.0–8.3)

## 2011-09-27 LAB — LIPID PANEL
Cholesterol: 138 mg/dL (ref 0–200)
Triglycerides: 98 mg/dL (ref <150)
VLDL: 20 mg/dL (ref 0–40)

## 2011-10-13 DIAGNOSIS — H543 Unqualified visual loss, both eyes: Secondary | ICD-10-CM | POA: Diagnosis not present

## 2011-10-13 DIAGNOSIS — I10 Essential (primary) hypertension: Secondary | ICD-10-CM | POA: Diagnosis not present

## 2011-10-13 DIAGNOSIS — E1149 Type 2 diabetes mellitus with other diabetic neurological complication: Secondary | ICD-10-CM | POA: Diagnosis not present

## 2011-10-13 DIAGNOSIS — M109 Gout, unspecified: Secondary | ICD-10-CM | POA: Diagnosis not present

## 2011-10-13 DIAGNOSIS — M159 Polyosteoarthritis, unspecified: Secondary | ICD-10-CM | POA: Diagnosis not present

## 2011-10-13 DIAGNOSIS — H3530 Unspecified macular degeneration: Secondary | ICD-10-CM | POA: Diagnosis not present

## 2011-10-13 DIAGNOSIS — I509 Heart failure, unspecified: Secondary | ICD-10-CM | POA: Diagnosis not present

## 2011-10-13 DIAGNOSIS — I872 Venous insufficiency (chronic) (peripheral): Secondary | ICD-10-CM | POA: Diagnosis not present

## 2011-10-13 DIAGNOSIS — E1142 Type 2 diabetes mellitus with diabetic polyneuropathy: Secondary | ICD-10-CM | POA: Diagnosis not present

## 2011-10-13 DIAGNOSIS — R32 Unspecified urinary incontinence: Secondary | ICD-10-CM | POA: Diagnosis not present

## 2011-10-14 ENCOUNTER — Ambulatory Visit: Payer: Medicare Other | Admitting: Gastroenterology

## 2011-10-14 ENCOUNTER — Telehealth: Payer: Self-pay | Admitting: Gastroenterology

## 2011-10-14 DIAGNOSIS — E1142 Type 2 diabetes mellitus with diabetic polyneuropathy: Secondary | ICD-10-CM | POA: Diagnosis not present

## 2011-10-14 DIAGNOSIS — I10 Essential (primary) hypertension: Secondary | ICD-10-CM | POA: Diagnosis not present

## 2011-10-14 DIAGNOSIS — I509 Heart failure, unspecified: Secondary | ICD-10-CM | POA: Diagnosis not present

## 2011-10-14 DIAGNOSIS — E1149 Type 2 diabetes mellitus with other diabetic neurological complication: Secondary | ICD-10-CM | POA: Diagnosis not present

## 2011-10-14 DIAGNOSIS — H3530 Unspecified macular degeneration: Secondary | ICD-10-CM | POA: Diagnosis not present

## 2011-10-14 DIAGNOSIS — M109 Gout, unspecified: Secondary | ICD-10-CM | POA: Diagnosis not present

## 2011-10-14 NOTE — Telephone Encounter (Signed)
Pt was a no show

## 2011-10-14 NOTE — Telephone Encounter (Signed)
CALLED PT. WIFE HAS BEEN ILL. PT WILL CALL us WHEN HE NEEDS AN APPT.

## 2011-10-15 DIAGNOSIS — E1149 Type 2 diabetes mellitus with other diabetic neurological complication: Secondary | ICD-10-CM | POA: Diagnosis not present

## 2011-10-15 DIAGNOSIS — I509 Heart failure, unspecified: Secondary | ICD-10-CM | POA: Diagnosis not present

## 2011-10-15 DIAGNOSIS — I10 Essential (primary) hypertension: Secondary | ICD-10-CM | POA: Diagnosis not present

## 2011-10-15 DIAGNOSIS — H3530 Unspecified macular degeneration: Secondary | ICD-10-CM | POA: Diagnosis not present

## 2011-10-15 DIAGNOSIS — M109 Gout, unspecified: Secondary | ICD-10-CM | POA: Diagnosis not present

## 2011-10-15 DIAGNOSIS — E1142 Type 2 diabetes mellitus with diabetic polyneuropathy: Secondary | ICD-10-CM | POA: Diagnosis not present

## 2011-10-18 DIAGNOSIS — E1142 Type 2 diabetes mellitus with diabetic polyneuropathy: Secondary | ICD-10-CM | POA: Diagnosis not present

## 2011-10-18 DIAGNOSIS — M109 Gout, unspecified: Secondary | ICD-10-CM | POA: Diagnosis not present

## 2011-10-18 DIAGNOSIS — I509 Heart failure, unspecified: Secondary | ICD-10-CM | POA: Diagnosis not present

## 2011-10-18 DIAGNOSIS — H3530 Unspecified macular degeneration: Secondary | ICD-10-CM | POA: Diagnosis not present

## 2011-10-18 DIAGNOSIS — E1149 Type 2 diabetes mellitus with other diabetic neurological complication: Secondary | ICD-10-CM | POA: Diagnosis not present

## 2011-10-18 DIAGNOSIS — I10 Essential (primary) hypertension: Secondary | ICD-10-CM | POA: Diagnosis not present

## 2011-10-20 DIAGNOSIS — E1149 Type 2 diabetes mellitus with other diabetic neurological complication: Secondary | ICD-10-CM | POA: Diagnosis not present

## 2011-10-20 DIAGNOSIS — I10 Essential (primary) hypertension: Secondary | ICD-10-CM | POA: Diagnosis not present

## 2011-10-20 DIAGNOSIS — I509 Heart failure, unspecified: Secondary | ICD-10-CM | POA: Diagnosis not present

## 2011-10-20 DIAGNOSIS — M109 Gout, unspecified: Secondary | ICD-10-CM | POA: Diagnosis not present

## 2011-10-20 DIAGNOSIS — H3530 Unspecified macular degeneration: Secondary | ICD-10-CM | POA: Diagnosis not present

## 2011-10-20 DIAGNOSIS — E1142 Type 2 diabetes mellitus with diabetic polyneuropathy: Secondary | ICD-10-CM | POA: Diagnosis not present

## 2011-10-21 ENCOUNTER — Encounter: Payer: Self-pay | Admitting: Cardiology

## 2011-10-22 DIAGNOSIS — E1142 Type 2 diabetes mellitus with diabetic polyneuropathy: Secondary | ICD-10-CM | POA: Diagnosis not present

## 2011-10-22 DIAGNOSIS — I509 Heart failure, unspecified: Secondary | ICD-10-CM | POA: Diagnosis not present

## 2011-10-22 DIAGNOSIS — M109 Gout, unspecified: Secondary | ICD-10-CM | POA: Diagnosis not present

## 2011-10-22 DIAGNOSIS — H3530 Unspecified macular degeneration: Secondary | ICD-10-CM | POA: Diagnosis not present

## 2011-10-22 DIAGNOSIS — I10 Essential (primary) hypertension: Secondary | ICD-10-CM | POA: Diagnosis not present

## 2011-10-22 DIAGNOSIS — E1149 Type 2 diabetes mellitus with other diabetic neurological complication: Secondary | ICD-10-CM | POA: Diagnosis not present

## 2011-10-25 DIAGNOSIS — I509 Heart failure, unspecified: Secondary | ICD-10-CM | POA: Diagnosis not present

## 2011-10-25 DIAGNOSIS — E1142 Type 2 diabetes mellitus with diabetic polyneuropathy: Secondary | ICD-10-CM | POA: Diagnosis not present

## 2011-10-25 DIAGNOSIS — E1149 Type 2 diabetes mellitus with other diabetic neurological complication: Secondary | ICD-10-CM | POA: Diagnosis not present

## 2011-10-25 DIAGNOSIS — H3530 Unspecified macular degeneration: Secondary | ICD-10-CM | POA: Diagnosis not present

## 2011-10-25 DIAGNOSIS — I10 Essential (primary) hypertension: Secondary | ICD-10-CM | POA: Diagnosis not present

## 2011-10-25 DIAGNOSIS — M109 Gout, unspecified: Secondary | ICD-10-CM | POA: Diagnosis not present

## 2011-10-26 DIAGNOSIS — E1142 Type 2 diabetes mellitus with diabetic polyneuropathy: Secondary | ICD-10-CM | POA: Diagnosis not present

## 2011-10-26 DIAGNOSIS — I10 Essential (primary) hypertension: Secondary | ICD-10-CM | POA: Diagnosis not present

## 2011-10-26 DIAGNOSIS — M109 Gout, unspecified: Secondary | ICD-10-CM | POA: Diagnosis not present

## 2011-10-26 DIAGNOSIS — H3530 Unspecified macular degeneration: Secondary | ICD-10-CM | POA: Diagnosis not present

## 2011-10-26 DIAGNOSIS — I509 Heart failure, unspecified: Secondary | ICD-10-CM | POA: Diagnosis not present

## 2011-10-26 DIAGNOSIS — E1149 Type 2 diabetes mellitus with other diabetic neurological complication: Secondary | ICD-10-CM | POA: Diagnosis not present

## 2011-10-27 DIAGNOSIS — E1149 Type 2 diabetes mellitus with other diabetic neurological complication: Secondary | ICD-10-CM | POA: Diagnosis not present

## 2011-10-27 DIAGNOSIS — I509 Heart failure, unspecified: Secondary | ICD-10-CM | POA: Diagnosis not present

## 2011-10-27 DIAGNOSIS — H3530 Unspecified macular degeneration: Secondary | ICD-10-CM | POA: Diagnosis not present

## 2011-10-27 DIAGNOSIS — I10 Essential (primary) hypertension: Secondary | ICD-10-CM | POA: Diagnosis not present

## 2011-10-27 DIAGNOSIS — E1142 Type 2 diabetes mellitus with diabetic polyneuropathy: Secondary | ICD-10-CM | POA: Diagnosis not present

## 2011-10-27 DIAGNOSIS — M109 Gout, unspecified: Secondary | ICD-10-CM | POA: Diagnosis not present

## 2011-10-28 DIAGNOSIS — I10 Essential (primary) hypertension: Secondary | ICD-10-CM | POA: Diagnosis not present

## 2011-10-28 DIAGNOSIS — E1149 Type 2 diabetes mellitus with other diabetic neurological complication: Secondary | ICD-10-CM | POA: Diagnosis not present

## 2011-10-28 DIAGNOSIS — H3530 Unspecified macular degeneration: Secondary | ICD-10-CM | POA: Diagnosis not present

## 2011-10-28 DIAGNOSIS — M109 Gout, unspecified: Secondary | ICD-10-CM | POA: Diagnosis not present

## 2011-10-28 DIAGNOSIS — E1142 Type 2 diabetes mellitus with diabetic polyneuropathy: Secondary | ICD-10-CM | POA: Diagnosis not present

## 2011-10-28 DIAGNOSIS — I509 Heart failure, unspecified: Secondary | ICD-10-CM | POA: Diagnosis not present

## 2011-11-01 ENCOUNTER — Ambulatory Visit (INDEPENDENT_AMBULATORY_CARE_PROVIDER_SITE_OTHER): Payer: Medicare Other | Admitting: Cardiology

## 2011-11-01 ENCOUNTER — Encounter: Payer: Self-pay | Admitting: Cardiology

## 2011-11-01 DIAGNOSIS — R9431 Abnormal electrocardiogram [ECG] [EKG]: Secondary | ICD-10-CM

## 2011-11-01 DIAGNOSIS — H3530 Unspecified macular degeneration: Secondary | ICD-10-CM | POA: Diagnosis not present

## 2011-11-01 DIAGNOSIS — E1149 Type 2 diabetes mellitus with other diabetic neurological complication: Secondary | ICD-10-CM | POA: Diagnosis not present

## 2011-11-01 DIAGNOSIS — E1142 Type 2 diabetes mellitus with diabetic polyneuropathy: Secondary | ICD-10-CM | POA: Diagnosis not present

## 2011-11-01 DIAGNOSIS — I509 Heart failure, unspecified: Secondary | ICD-10-CM | POA: Diagnosis not present

## 2011-11-01 DIAGNOSIS — M109 Gout, unspecified: Secondary | ICD-10-CM | POA: Diagnosis not present

## 2011-11-01 DIAGNOSIS — I10 Essential (primary) hypertension: Secondary | ICD-10-CM | POA: Diagnosis not present

## 2011-11-01 DIAGNOSIS — R55 Syncope and collapse: Secondary | ICD-10-CM

## 2011-11-01 NOTE — Patient Instructions (Signed)
Your physician recommends that you schedule a follow-up appointment in: 6 months  

## 2011-11-01 NOTE — Progress Notes (Signed)
Clinical Summary [name] is a 77 y.o.male presenting for followup. He was seen in September. He now resides at [facility]. Reports no dizziness or syncope since last visit. He is completely off Atenolol.  Ambulates with a cane. Denies any falls.   No Known Allergies  Medication list reviewed.  Past Medical History  Diagnosis Date  . Diverticulosis   . Mixed hyperlipidemia   . Gout   . Clostridium difficile colitis   . Chronic constipation   . Essential hypertension, benign   . Type 2 diabetes mellitus   . Venous insufficiency   . Benign essential tremor   . Macular degeneration   . Diabetic neuropathy     Social History [name] reports that he has quit smoking. His smoking use included Cigarettes. He has never used smokeless tobacco. [name] reports that he drinks alcohol.  Review of Systems No chest pain. No palpitations. Hard of hearing. Appetite stable. Otherwise negative.  Physical Examination Filed Vitals:   11/01/11 0834  BP: 147/87  Pulse: 65   Elderly male in no acute distress.  HEENT: Conjunctiva and lids are normal, hearing aids in place, oropharynx with moist mucosa.  Neck: Supple, no elevated JVP or carotid bruits, no thyromegaly.  Lungs: Clear to auscultation, nonlabored.  Cardiac: Regular rate and rhythm with 2/6 systolic murmur at the base, preserved second heart sound, no rub or gallop.  Abdomen: Soft, nontender, bowel sounds present.  Skin: Warm and dry.  Musculoskeletal: Kyphosis is noted.  Neuropsychiatric: Alert and oriented x3, hard of hearing, affect appropriate.     Problem List and Plan

## 2011-11-01 NOTE — Assessment & Plan Note (Signed)
Conduction system disease with bradycardia - heart rate better now.

## 2011-11-01 NOTE — Assessment & Plan Note (Signed)
No recurrence. Keep off rate lowering medications and continue observation.

## 2011-11-03 DIAGNOSIS — E1149 Type 2 diabetes mellitus with other diabetic neurological complication: Secondary | ICD-10-CM | POA: Diagnosis not present

## 2011-11-03 DIAGNOSIS — E1142 Type 2 diabetes mellitus with diabetic polyneuropathy: Secondary | ICD-10-CM | POA: Diagnosis not present

## 2011-11-03 DIAGNOSIS — I10 Essential (primary) hypertension: Secondary | ICD-10-CM | POA: Diagnosis not present

## 2011-11-03 DIAGNOSIS — M109 Gout, unspecified: Secondary | ICD-10-CM | POA: Diagnosis not present

## 2011-11-03 DIAGNOSIS — I509 Heart failure, unspecified: Secondary | ICD-10-CM | POA: Diagnosis not present

## 2011-11-03 DIAGNOSIS — H3530 Unspecified macular degeneration: Secondary | ICD-10-CM | POA: Diagnosis not present

## 2011-11-04 DIAGNOSIS — M109 Gout, unspecified: Secondary | ICD-10-CM | POA: Diagnosis not present

## 2011-11-04 DIAGNOSIS — E1142 Type 2 diabetes mellitus with diabetic polyneuropathy: Secondary | ICD-10-CM | POA: Diagnosis not present

## 2011-11-04 DIAGNOSIS — E1149 Type 2 diabetes mellitus with other diabetic neurological complication: Secondary | ICD-10-CM | POA: Diagnosis not present

## 2011-11-04 DIAGNOSIS — I10 Essential (primary) hypertension: Secondary | ICD-10-CM | POA: Diagnosis not present

## 2011-11-04 DIAGNOSIS — I509 Heart failure, unspecified: Secondary | ICD-10-CM | POA: Diagnosis not present

## 2011-11-04 DIAGNOSIS — H3530 Unspecified macular degeneration: Secondary | ICD-10-CM | POA: Diagnosis not present

## 2011-11-08 DIAGNOSIS — H3530 Unspecified macular degeneration: Secondary | ICD-10-CM | POA: Diagnosis not present

## 2011-11-08 DIAGNOSIS — E1142 Type 2 diabetes mellitus with diabetic polyneuropathy: Secondary | ICD-10-CM | POA: Diagnosis not present

## 2011-11-08 DIAGNOSIS — I509 Heart failure, unspecified: Secondary | ICD-10-CM | POA: Diagnosis not present

## 2011-11-08 DIAGNOSIS — M109 Gout, unspecified: Secondary | ICD-10-CM | POA: Diagnosis not present

## 2011-11-08 DIAGNOSIS — E1149 Type 2 diabetes mellitus with other diabetic neurological complication: Secondary | ICD-10-CM | POA: Diagnosis not present

## 2011-11-08 DIAGNOSIS — I10 Essential (primary) hypertension: Secondary | ICD-10-CM | POA: Diagnosis not present

## 2011-11-10 DIAGNOSIS — M109 Gout, unspecified: Secondary | ICD-10-CM | POA: Diagnosis not present

## 2011-11-10 DIAGNOSIS — I10 Essential (primary) hypertension: Secondary | ICD-10-CM | POA: Diagnosis not present

## 2011-11-10 DIAGNOSIS — E1142 Type 2 diabetes mellitus with diabetic polyneuropathy: Secondary | ICD-10-CM | POA: Diagnosis not present

## 2011-11-10 DIAGNOSIS — E1149 Type 2 diabetes mellitus with other diabetic neurological complication: Secondary | ICD-10-CM | POA: Diagnosis not present

## 2011-11-10 DIAGNOSIS — I509 Heart failure, unspecified: Secondary | ICD-10-CM | POA: Diagnosis not present

## 2011-11-10 DIAGNOSIS — H3530 Unspecified macular degeneration: Secondary | ICD-10-CM | POA: Diagnosis not present

## 2011-11-15 DIAGNOSIS — I10 Essential (primary) hypertension: Secondary | ICD-10-CM | POA: Diagnosis not present

## 2011-11-15 DIAGNOSIS — E1149 Type 2 diabetes mellitus with other diabetic neurological complication: Secondary | ICD-10-CM | POA: Diagnosis not present

## 2011-11-15 DIAGNOSIS — M109 Gout, unspecified: Secondary | ICD-10-CM | POA: Diagnosis not present

## 2011-11-15 DIAGNOSIS — I509 Heart failure, unspecified: Secondary | ICD-10-CM | POA: Diagnosis not present

## 2011-11-15 DIAGNOSIS — E1142 Type 2 diabetes mellitus with diabetic polyneuropathy: Secondary | ICD-10-CM | POA: Diagnosis not present

## 2011-11-15 DIAGNOSIS — H3530 Unspecified macular degeneration: Secondary | ICD-10-CM | POA: Diagnosis not present

## 2011-11-17 DIAGNOSIS — E1149 Type 2 diabetes mellitus with other diabetic neurological complication: Secondary | ICD-10-CM | POA: Diagnosis not present

## 2011-11-17 DIAGNOSIS — H3530 Unspecified macular degeneration: Secondary | ICD-10-CM | POA: Diagnosis not present

## 2011-11-17 DIAGNOSIS — M109 Gout, unspecified: Secondary | ICD-10-CM | POA: Diagnosis not present

## 2011-11-17 DIAGNOSIS — E1142 Type 2 diabetes mellitus with diabetic polyneuropathy: Secondary | ICD-10-CM | POA: Diagnosis not present

## 2011-11-17 DIAGNOSIS — I509 Heart failure, unspecified: Secondary | ICD-10-CM | POA: Diagnosis not present

## 2011-11-17 DIAGNOSIS — I10 Essential (primary) hypertension: Secondary | ICD-10-CM | POA: Diagnosis not present

## 2011-11-19 DIAGNOSIS — E1149 Type 2 diabetes mellitus with other diabetic neurological complication: Secondary | ICD-10-CM | POA: Diagnosis not present

## 2011-11-19 DIAGNOSIS — I10 Essential (primary) hypertension: Secondary | ICD-10-CM | POA: Diagnosis not present

## 2011-11-19 DIAGNOSIS — M109 Gout, unspecified: Secondary | ICD-10-CM | POA: Diagnosis not present

## 2011-11-19 DIAGNOSIS — H3530 Unspecified macular degeneration: Secondary | ICD-10-CM | POA: Diagnosis not present

## 2011-11-19 DIAGNOSIS — E1142 Type 2 diabetes mellitus with diabetic polyneuropathy: Secondary | ICD-10-CM | POA: Diagnosis not present

## 2011-11-19 DIAGNOSIS — I509 Heart failure, unspecified: Secondary | ICD-10-CM | POA: Diagnosis not present

## 2011-11-23 DIAGNOSIS — I10 Essential (primary) hypertension: Secondary | ICD-10-CM | POA: Diagnosis not present

## 2011-11-23 DIAGNOSIS — E119 Type 2 diabetes mellitus without complications: Secondary | ICD-10-CM | POA: Diagnosis not present

## 2011-11-23 DIAGNOSIS — J449 Chronic obstructive pulmonary disease, unspecified: Secondary | ICD-10-CM | POA: Diagnosis not present

## 2011-11-29 DIAGNOSIS — R262 Difficulty in walking, not elsewhere classified: Secondary | ICD-10-CM | POA: Diagnosis not present

## 2011-11-29 DIAGNOSIS — M6281 Muscle weakness (generalized): Secondary | ICD-10-CM | POA: Diagnosis not present

## 2011-11-29 DIAGNOSIS — E119 Type 2 diabetes mellitus without complications: Secondary | ICD-10-CM | POA: Diagnosis not present

## 2011-11-29 DIAGNOSIS — M159 Polyosteoarthritis, unspecified: Secondary | ICD-10-CM | POA: Diagnosis not present

## 2011-11-29 DIAGNOSIS — M25569 Pain in unspecified knee: Secondary | ICD-10-CM | POA: Diagnosis not present

## 2011-11-29 DIAGNOSIS — I509 Heart failure, unspecified: Secondary | ICD-10-CM | POA: Diagnosis not present

## 2011-12-01 DIAGNOSIS — I509 Heart failure, unspecified: Secondary | ICD-10-CM | POA: Diagnosis not present

## 2011-12-01 DIAGNOSIS — R262 Difficulty in walking, not elsewhere classified: Secondary | ICD-10-CM | POA: Diagnosis not present

## 2011-12-01 DIAGNOSIS — M25569 Pain in unspecified knee: Secondary | ICD-10-CM | POA: Diagnosis not present

## 2011-12-01 DIAGNOSIS — E119 Type 2 diabetes mellitus without complications: Secondary | ICD-10-CM | POA: Diagnosis not present

## 2011-12-01 DIAGNOSIS — M6281 Muscle weakness (generalized): Secondary | ICD-10-CM | POA: Diagnosis not present

## 2011-12-01 DIAGNOSIS — M159 Polyosteoarthritis, unspecified: Secondary | ICD-10-CM | POA: Diagnosis not present

## 2011-12-02 ENCOUNTER — Encounter: Payer: Self-pay | Admitting: Cardiology

## 2011-12-02 DIAGNOSIS — I509 Heart failure, unspecified: Secondary | ICD-10-CM | POA: Diagnosis not present

## 2011-12-02 DIAGNOSIS — R262 Difficulty in walking, not elsewhere classified: Secondary | ICD-10-CM | POA: Diagnosis not present

## 2011-12-02 DIAGNOSIS — M6281 Muscle weakness (generalized): Secondary | ICD-10-CM | POA: Diagnosis not present

## 2011-12-02 DIAGNOSIS — M159 Polyosteoarthritis, unspecified: Secondary | ICD-10-CM | POA: Diagnosis not present

## 2011-12-02 DIAGNOSIS — M25569 Pain in unspecified knee: Secondary | ICD-10-CM | POA: Diagnosis not present

## 2011-12-02 DIAGNOSIS — E119 Type 2 diabetes mellitus without complications: Secondary | ICD-10-CM | POA: Diagnosis not present

## 2011-12-03 DIAGNOSIS — M6281 Muscle weakness (generalized): Secondary | ICD-10-CM | POA: Diagnosis not present

## 2011-12-03 DIAGNOSIS — M25569 Pain in unspecified knee: Secondary | ICD-10-CM | POA: Diagnosis not present

## 2011-12-03 DIAGNOSIS — M159 Polyosteoarthritis, unspecified: Secondary | ICD-10-CM | POA: Diagnosis not present

## 2011-12-03 DIAGNOSIS — I509 Heart failure, unspecified: Secondary | ICD-10-CM | POA: Diagnosis not present

## 2011-12-03 DIAGNOSIS — E119 Type 2 diabetes mellitus without complications: Secondary | ICD-10-CM | POA: Diagnosis not present

## 2011-12-03 DIAGNOSIS — R262 Difficulty in walking, not elsewhere classified: Secondary | ICD-10-CM | POA: Diagnosis not present

## 2011-12-06 DIAGNOSIS — M6281 Muscle weakness (generalized): Secondary | ICD-10-CM | POA: Diagnosis not present

## 2011-12-06 DIAGNOSIS — I509 Heart failure, unspecified: Secondary | ICD-10-CM | POA: Diagnosis not present

## 2011-12-06 DIAGNOSIS — M25569 Pain in unspecified knee: Secondary | ICD-10-CM | POA: Diagnosis not present

## 2011-12-06 DIAGNOSIS — E119 Type 2 diabetes mellitus without complications: Secondary | ICD-10-CM | POA: Diagnosis not present

## 2011-12-06 DIAGNOSIS — M159 Polyosteoarthritis, unspecified: Secondary | ICD-10-CM | POA: Diagnosis not present

## 2011-12-06 DIAGNOSIS — R262 Difficulty in walking, not elsewhere classified: Secondary | ICD-10-CM | POA: Diagnosis not present

## 2011-12-07 DIAGNOSIS — M25569 Pain in unspecified knee: Secondary | ICD-10-CM | POA: Diagnosis not present

## 2011-12-07 DIAGNOSIS — R262 Difficulty in walking, not elsewhere classified: Secondary | ICD-10-CM | POA: Diagnosis not present

## 2011-12-07 DIAGNOSIS — E119 Type 2 diabetes mellitus without complications: Secondary | ICD-10-CM | POA: Diagnosis not present

## 2011-12-07 DIAGNOSIS — I509 Heart failure, unspecified: Secondary | ICD-10-CM | POA: Diagnosis not present

## 2011-12-07 DIAGNOSIS — M6281 Muscle weakness (generalized): Secondary | ICD-10-CM | POA: Diagnosis not present

## 2011-12-07 DIAGNOSIS — M159 Polyosteoarthritis, unspecified: Secondary | ICD-10-CM | POA: Diagnosis not present

## 2011-12-08 DIAGNOSIS — M79609 Pain in unspecified limb: Secondary | ICD-10-CM | POA: Diagnosis not present

## 2011-12-08 DIAGNOSIS — M159 Polyosteoarthritis, unspecified: Secondary | ICD-10-CM | POA: Diagnosis not present

## 2011-12-08 DIAGNOSIS — M25569 Pain in unspecified knee: Secondary | ICD-10-CM | POA: Diagnosis not present

## 2011-12-08 DIAGNOSIS — E119 Type 2 diabetes mellitus without complications: Secondary | ICD-10-CM | POA: Diagnosis not present

## 2011-12-08 DIAGNOSIS — I509 Heart failure, unspecified: Secondary | ICD-10-CM | POA: Diagnosis not present

## 2011-12-08 DIAGNOSIS — R262 Difficulty in walking, not elsewhere classified: Secondary | ICD-10-CM | POA: Diagnosis not present

## 2011-12-08 DIAGNOSIS — M6281 Muscle weakness (generalized): Secondary | ICD-10-CM | POA: Diagnosis not present

## 2011-12-08 DIAGNOSIS — B351 Tinea unguium: Secondary | ICD-10-CM | POA: Diagnosis not present

## 2011-12-10 DIAGNOSIS — M6281 Muscle weakness (generalized): Secondary | ICD-10-CM | POA: Diagnosis not present

## 2011-12-10 DIAGNOSIS — E119 Type 2 diabetes mellitus without complications: Secondary | ICD-10-CM | POA: Diagnosis not present

## 2011-12-10 DIAGNOSIS — M25569 Pain in unspecified knee: Secondary | ICD-10-CM | POA: Diagnosis not present

## 2011-12-10 DIAGNOSIS — I509 Heart failure, unspecified: Secondary | ICD-10-CM | POA: Diagnosis not present

## 2011-12-10 DIAGNOSIS — M159 Polyosteoarthritis, unspecified: Secondary | ICD-10-CM | POA: Diagnosis not present

## 2011-12-10 DIAGNOSIS — R262 Difficulty in walking, not elsewhere classified: Secondary | ICD-10-CM | POA: Diagnosis not present

## 2011-12-13 DIAGNOSIS — M159 Polyosteoarthritis, unspecified: Secondary | ICD-10-CM | POA: Diagnosis not present

## 2011-12-13 DIAGNOSIS — M25569 Pain in unspecified knee: Secondary | ICD-10-CM | POA: Diagnosis not present

## 2011-12-13 DIAGNOSIS — I509 Heart failure, unspecified: Secondary | ICD-10-CM | POA: Diagnosis not present

## 2011-12-13 DIAGNOSIS — R262 Difficulty in walking, not elsewhere classified: Secondary | ICD-10-CM | POA: Diagnosis not present

## 2011-12-13 DIAGNOSIS — M6281 Muscle weakness (generalized): Secondary | ICD-10-CM | POA: Diagnosis not present

## 2011-12-13 DIAGNOSIS — E119 Type 2 diabetes mellitus without complications: Secondary | ICD-10-CM | POA: Diagnosis not present

## 2011-12-14 DIAGNOSIS — I509 Heart failure, unspecified: Secondary | ICD-10-CM | POA: Diagnosis not present

## 2011-12-14 DIAGNOSIS — E119 Type 2 diabetes mellitus without complications: Secondary | ICD-10-CM | POA: Diagnosis not present

## 2011-12-14 DIAGNOSIS — M159 Polyosteoarthritis, unspecified: Secondary | ICD-10-CM | POA: Diagnosis not present

## 2011-12-14 DIAGNOSIS — M25569 Pain in unspecified knee: Secondary | ICD-10-CM | POA: Diagnosis not present

## 2011-12-14 DIAGNOSIS — M6281 Muscle weakness (generalized): Secondary | ICD-10-CM | POA: Diagnosis not present

## 2011-12-14 DIAGNOSIS — R262 Difficulty in walking, not elsewhere classified: Secondary | ICD-10-CM | POA: Diagnosis not present

## 2011-12-17 DIAGNOSIS — I509 Heart failure, unspecified: Secondary | ICD-10-CM | POA: Diagnosis not present

## 2011-12-17 DIAGNOSIS — R262 Difficulty in walking, not elsewhere classified: Secondary | ICD-10-CM | POA: Diagnosis not present

## 2011-12-17 DIAGNOSIS — M159 Polyosteoarthritis, unspecified: Secondary | ICD-10-CM | POA: Diagnosis not present

## 2011-12-17 DIAGNOSIS — E119 Type 2 diabetes mellitus without complications: Secondary | ICD-10-CM | POA: Diagnosis not present

## 2011-12-17 DIAGNOSIS — M25569 Pain in unspecified knee: Secondary | ICD-10-CM | POA: Diagnosis not present

## 2011-12-17 DIAGNOSIS — M6281 Muscle weakness (generalized): Secondary | ICD-10-CM | POA: Diagnosis not present

## 2011-12-21 DIAGNOSIS — M6281 Muscle weakness (generalized): Secondary | ICD-10-CM | POA: Diagnosis not present

## 2011-12-21 DIAGNOSIS — E119 Type 2 diabetes mellitus without complications: Secondary | ICD-10-CM | POA: Diagnosis not present

## 2011-12-21 DIAGNOSIS — M159 Polyosteoarthritis, unspecified: Secondary | ICD-10-CM | POA: Diagnosis not present

## 2011-12-21 DIAGNOSIS — M25569 Pain in unspecified knee: Secondary | ICD-10-CM | POA: Diagnosis not present

## 2011-12-21 DIAGNOSIS — R262 Difficulty in walking, not elsewhere classified: Secondary | ICD-10-CM | POA: Diagnosis not present

## 2011-12-21 DIAGNOSIS — I509 Heart failure, unspecified: Secondary | ICD-10-CM | POA: Diagnosis not present

## 2011-12-22 DIAGNOSIS — M6281 Muscle weakness (generalized): Secondary | ICD-10-CM | POA: Diagnosis not present

## 2011-12-22 DIAGNOSIS — M159 Polyosteoarthritis, unspecified: Secondary | ICD-10-CM | POA: Diagnosis not present

## 2011-12-22 DIAGNOSIS — M25569 Pain in unspecified knee: Secondary | ICD-10-CM | POA: Diagnosis not present

## 2011-12-22 DIAGNOSIS — I509 Heart failure, unspecified: Secondary | ICD-10-CM | POA: Diagnosis not present

## 2011-12-22 DIAGNOSIS — E119 Type 2 diabetes mellitus without complications: Secondary | ICD-10-CM | POA: Diagnosis not present

## 2011-12-22 DIAGNOSIS — R262 Difficulty in walking, not elsewhere classified: Secondary | ICD-10-CM | POA: Diagnosis not present

## 2011-12-24 DIAGNOSIS — M25569 Pain in unspecified knee: Secondary | ICD-10-CM | POA: Diagnosis not present

## 2011-12-24 DIAGNOSIS — R262 Difficulty in walking, not elsewhere classified: Secondary | ICD-10-CM | POA: Diagnosis not present

## 2011-12-24 DIAGNOSIS — M159 Polyosteoarthritis, unspecified: Secondary | ICD-10-CM | POA: Diagnosis not present

## 2011-12-24 DIAGNOSIS — I509 Heart failure, unspecified: Secondary | ICD-10-CM | POA: Diagnosis not present

## 2011-12-24 DIAGNOSIS — M6281 Muscle weakness (generalized): Secondary | ICD-10-CM | POA: Diagnosis not present

## 2011-12-24 DIAGNOSIS — E119 Type 2 diabetes mellitus without complications: Secondary | ICD-10-CM | POA: Diagnosis not present

## 2011-12-29 DIAGNOSIS — I509 Heart failure, unspecified: Secondary | ICD-10-CM | POA: Diagnosis not present

## 2011-12-29 DIAGNOSIS — M25569 Pain in unspecified knee: Secondary | ICD-10-CM | POA: Diagnosis not present

## 2011-12-29 DIAGNOSIS — R262 Difficulty in walking, not elsewhere classified: Secondary | ICD-10-CM | POA: Diagnosis not present

## 2011-12-29 DIAGNOSIS — M6281 Muscle weakness (generalized): Secondary | ICD-10-CM | POA: Diagnosis not present

## 2011-12-29 DIAGNOSIS — M159 Polyosteoarthritis, unspecified: Secondary | ICD-10-CM | POA: Diagnosis not present

## 2011-12-29 DIAGNOSIS — E119 Type 2 diabetes mellitus without complications: Secondary | ICD-10-CM | POA: Diagnosis not present

## 2011-12-30 DIAGNOSIS — M159 Polyosteoarthritis, unspecified: Secondary | ICD-10-CM | POA: Diagnosis not present

## 2011-12-30 DIAGNOSIS — I509 Heart failure, unspecified: Secondary | ICD-10-CM | POA: Diagnosis not present

## 2011-12-30 DIAGNOSIS — M6281 Muscle weakness (generalized): Secondary | ICD-10-CM | POA: Diagnosis not present

## 2011-12-30 DIAGNOSIS — R262 Difficulty in walking, not elsewhere classified: Secondary | ICD-10-CM | POA: Diagnosis not present

## 2011-12-30 DIAGNOSIS — E119 Type 2 diabetes mellitus without complications: Secondary | ICD-10-CM | POA: Diagnosis not present

## 2011-12-30 DIAGNOSIS — M25569 Pain in unspecified knee: Secondary | ICD-10-CM | POA: Diagnosis not present

## 2011-12-31 DIAGNOSIS — E119 Type 2 diabetes mellitus without complications: Secondary | ICD-10-CM | POA: Diagnosis not present

## 2011-12-31 DIAGNOSIS — I509 Heart failure, unspecified: Secondary | ICD-10-CM | POA: Diagnosis not present

## 2011-12-31 DIAGNOSIS — R262 Difficulty in walking, not elsewhere classified: Secondary | ICD-10-CM | POA: Diagnosis not present

## 2011-12-31 DIAGNOSIS — M159 Polyosteoarthritis, unspecified: Secondary | ICD-10-CM | POA: Diagnosis not present

## 2011-12-31 DIAGNOSIS — M6281 Muscle weakness (generalized): Secondary | ICD-10-CM | POA: Diagnosis not present

## 2011-12-31 DIAGNOSIS — M25569 Pain in unspecified knee: Secondary | ICD-10-CM | POA: Diagnosis not present

## 2012-01-03 DIAGNOSIS — M6281 Muscle weakness (generalized): Secondary | ICD-10-CM | POA: Diagnosis not present

## 2012-01-03 DIAGNOSIS — M159 Polyosteoarthritis, unspecified: Secondary | ICD-10-CM | POA: Diagnosis not present

## 2012-01-03 DIAGNOSIS — R262 Difficulty in walking, not elsewhere classified: Secondary | ICD-10-CM | POA: Diagnosis not present

## 2012-01-03 DIAGNOSIS — E119 Type 2 diabetes mellitus without complications: Secondary | ICD-10-CM | POA: Diagnosis not present

## 2012-01-03 DIAGNOSIS — I509 Heart failure, unspecified: Secondary | ICD-10-CM | POA: Diagnosis not present

## 2012-01-03 DIAGNOSIS — M25569 Pain in unspecified knee: Secondary | ICD-10-CM | POA: Diagnosis not present

## 2012-01-05 DIAGNOSIS — I509 Heart failure, unspecified: Secondary | ICD-10-CM | POA: Diagnosis not present

## 2012-01-05 DIAGNOSIS — E119 Type 2 diabetes mellitus without complications: Secondary | ICD-10-CM | POA: Diagnosis not present

## 2012-01-05 DIAGNOSIS — M25569 Pain in unspecified knee: Secondary | ICD-10-CM | POA: Diagnosis not present

## 2012-01-05 DIAGNOSIS — M159 Polyosteoarthritis, unspecified: Secondary | ICD-10-CM | POA: Diagnosis not present

## 2012-01-05 DIAGNOSIS — R262 Difficulty in walking, not elsewhere classified: Secondary | ICD-10-CM | POA: Diagnosis not present

## 2012-01-05 DIAGNOSIS — M6281 Muscle weakness (generalized): Secondary | ICD-10-CM | POA: Diagnosis not present

## 2012-01-06 DIAGNOSIS — I509 Heart failure, unspecified: Secondary | ICD-10-CM | POA: Diagnosis not present

## 2012-01-06 DIAGNOSIS — E119 Type 2 diabetes mellitus without complications: Secondary | ICD-10-CM | POA: Diagnosis not present

## 2012-01-06 DIAGNOSIS — M6281 Muscle weakness (generalized): Secondary | ICD-10-CM | POA: Diagnosis not present

## 2012-01-06 DIAGNOSIS — M159 Polyosteoarthritis, unspecified: Secondary | ICD-10-CM | POA: Diagnosis not present

## 2012-01-06 DIAGNOSIS — M25569 Pain in unspecified knee: Secondary | ICD-10-CM | POA: Diagnosis not present

## 2012-01-06 DIAGNOSIS — R262 Difficulty in walking, not elsewhere classified: Secondary | ICD-10-CM | POA: Diagnosis not present

## 2012-01-10 DIAGNOSIS — M159 Polyosteoarthritis, unspecified: Secondary | ICD-10-CM | POA: Diagnosis not present

## 2012-01-10 DIAGNOSIS — R262 Difficulty in walking, not elsewhere classified: Secondary | ICD-10-CM | POA: Diagnosis not present

## 2012-01-10 DIAGNOSIS — M25569 Pain in unspecified knee: Secondary | ICD-10-CM | POA: Diagnosis not present

## 2012-01-10 DIAGNOSIS — M6281 Muscle weakness (generalized): Secondary | ICD-10-CM | POA: Diagnosis not present

## 2012-01-10 DIAGNOSIS — I509 Heart failure, unspecified: Secondary | ICD-10-CM | POA: Diagnosis not present

## 2012-01-10 DIAGNOSIS — E119 Type 2 diabetes mellitus without complications: Secondary | ICD-10-CM | POA: Diagnosis not present

## 2012-02-15 DIAGNOSIS — E119 Type 2 diabetes mellitus without complications: Secondary | ICD-10-CM | POA: Diagnosis not present

## 2012-02-15 DIAGNOSIS — G589 Mononeuropathy, unspecified: Secondary | ICD-10-CM | POA: Diagnosis not present

## 2012-02-16 DIAGNOSIS — B351 Tinea unguium: Secondary | ICD-10-CM | POA: Diagnosis not present

## 2012-02-16 DIAGNOSIS — M79609 Pain in unspecified limb: Secondary | ICD-10-CM | POA: Diagnosis not present

## 2012-03-02 DIAGNOSIS — Z85820 Personal history of malignant melanoma of skin: Secondary | ICD-10-CM | POA: Diagnosis not present

## 2012-03-02 DIAGNOSIS — D235 Other benign neoplasm of skin of trunk: Secondary | ICD-10-CM | POA: Diagnosis not present

## 2012-03-02 DIAGNOSIS — L820 Inflamed seborrheic keratosis: Secondary | ICD-10-CM | POA: Diagnosis not present

## 2012-03-07 DIAGNOSIS — I10 Essential (primary) hypertension: Secondary | ICD-10-CM | POA: Diagnosis not present

## 2012-03-07 DIAGNOSIS — Z79899 Other long term (current) drug therapy: Secondary | ICD-10-CM | POA: Diagnosis not present

## 2012-03-31 DIAGNOSIS — R259 Unspecified abnormal involuntary movements: Secondary | ICD-10-CM | POA: Diagnosis not present

## 2012-03-31 DIAGNOSIS — M129 Arthropathy, unspecified: Secondary | ICD-10-CM | POA: Diagnosis not present

## 2012-03-31 DIAGNOSIS — E119 Type 2 diabetes mellitus without complications: Secondary | ICD-10-CM | POA: Diagnosis not present

## 2012-03-31 DIAGNOSIS — R42 Dizziness and giddiness: Secondary | ICD-10-CM | POA: Diagnosis not present

## 2012-03-31 DIAGNOSIS — I10 Essential (primary) hypertension: Secondary | ICD-10-CM | POA: Diagnosis not present

## 2012-04-05 DIAGNOSIS — H6120 Impacted cerumen, unspecified ear: Secondary | ICD-10-CM | POA: Diagnosis not present

## 2012-04-26 DIAGNOSIS — B351 Tinea unguium: Secondary | ICD-10-CM | POA: Diagnosis not present

## 2012-04-26 DIAGNOSIS — M79609 Pain in unspecified limb: Secondary | ICD-10-CM | POA: Diagnosis not present

## 2012-04-27 DIAGNOSIS — L820 Inflamed seborrheic keratosis: Secondary | ICD-10-CM | POA: Diagnosis not present

## 2012-04-27 DIAGNOSIS — L821 Other seborrheic keratosis: Secondary | ICD-10-CM | POA: Diagnosis not present

## 2012-05-08 DIAGNOSIS — L259 Unspecified contact dermatitis, unspecified cause: Secondary | ICD-10-CM | POA: Diagnosis not present

## 2012-05-23 DIAGNOSIS — R259 Unspecified abnormal involuntary movements: Secondary | ICD-10-CM | POA: Diagnosis not present

## 2012-05-23 DIAGNOSIS — G5790 Unspecified mononeuropathy of unspecified lower limb: Secondary | ICD-10-CM | POA: Diagnosis not present

## 2012-05-23 DIAGNOSIS — I10 Essential (primary) hypertension: Secondary | ICD-10-CM | POA: Diagnosis not present

## 2012-05-23 DIAGNOSIS — E119 Type 2 diabetes mellitus without complications: Secondary | ICD-10-CM | POA: Diagnosis not present

## 2012-07-03 DIAGNOSIS — M129 Arthropathy, unspecified: Secondary | ICD-10-CM | POA: Diagnosis not present

## 2012-07-03 DIAGNOSIS — E119 Type 2 diabetes mellitus without complications: Secondary | ICD-10-CM | POA: Diagnosis not present

## 2012-07-03 DIAGNOSIS — I10 Essential (primary) hypertension: Secondary | ICD-10-CM | POA: Diagnosis not present

## 2012-07-03 DIAGNOSIS — I509 Heart failure, unspecified: Secondary | ICD-10-CM | POA: Diagnosis not present

## 2012-07-12 DIAGNOSIS — B351 Tinea unguium: Secondary | ICD-10-CM | POA: Diagnosis not present

## 2012-07-12 DIAGNOSIS — M79609 Pain in unspecified limb: Secondary | ICD-10-CM | POA: Diagnosis not present

## 2012-08-10 DIAGNOSIS — D235 Other benign neoplasm of skin of trunk: Secondary | ICD-10-CM | POA: Diagnosis not present

## 2012-08-10 DIAGNOSIS — Z85820 Personal history of malignant melanoma of skin: Secondary | ICD-10-CM | POA: Diagnosis not present

## 2012-08-10 DIAGNOSIS — L820 Inflamed seborrheic keratosis: Secondary | ICD-10-CM | POA: Diagnosis not present

## 2012-08-22 DIAGNOSIS — E119 Type 2 diabetes mellitus without complications: Secondary | ICD-10-CM | POA: Diagnosis not present

## 2012-08-22 DIAGNOSIS — G589 Mononeuropathy, unspecified: Secondary | ICD-10-CM | POA: Diagnosis not present

## 2012-08-22 DIAGNOSIS — I10 Essential (primary) hypertension: Secondary | ICD-10-CM | POA: Diagnosis not present

## 2012-08-29 DIAGNOSIS — Z23 Encounter for immunization: Secondary | ICD-10-CM | POA: Diagnosis not present

## 2012-09-27 DIAGNOSIS — M79609 Pain in unspecified limb: Secondary | ICD-10-CM | POA: Diagnosis not present

## 2012-09-27 DIAGNOSIS — B351 Tinea unguium: Secondary | ICD-10-CM | POA: Diagnosis not present

## 2012-10-02 DIAGNOSIS — I10 Essential (primary) hypertension: Secondary | ICD-10-CM | POA: Diagnosis not present

## 2012-10-02 DIAGNOSIS — I509 Heart failure, unspecified: Secondary | ICD-10-CM | POA: Diagnosis not present

## 2012-10-02 DIAGNOSIS — M25569 Pain in unspecified knee: Secondary | ICD-10-CM | POA: Diagnosis not present

## 2012-10-02 DIAGNOSIS — E119 Type 2 diabetes mellitus without complications: Secondary | ICD-10-CM | POA: Diagnosis not present

## 2012-11-05 ENCOUNTER — Encounter (HOSPITAL_COMMUNITY): Payer: Self-pay

## 2012-11-05 ENCOUNTER — Emergency Department (HOSPITAL_COMMUNITY): Payer: Medicare Other

## 2012-11-05 ENCOUNTER — Inpatient Hospital Stay (HOSPITAL_COMMUNITY)
Admission: EM | Admit: 2012-11-05 | Discharge: 2012-11-09 | DRG: 470 | Disposition: A | Discharge status: Skilled Nursing Facility | Payer: Medicare Other | Attending: Internal Medicine | Admitting: Internal Medicine

## 2012-11-05 ENCOUNTER — Inpatient Hospital Stay (HOSPITAL_COMMUNITY): Payer: Medicare Other

## 2012-11-05 DIAGNOSIS — S72043A Displaced fracture of base of neck of unspecified femur, initial encounter for closed fracture: Secondary | ICD-10-CM | POA: Diagnosis not present

## 2012-11-05 DIAGNOSIS — R42 Dizziness and giddiness: Secondary | ICD-10-CM | POA: Diagnosis present

## 2012-11-05 DIAGNOSIS — Z8739 Personal history of other diseases of the musculoskeletal system and connective tissue: Secondary | ICD-10-CM | POA: Diagnosis not present

## 2012-11-05 DIAGNOSIS — Z96649 Presence of unspecified artificial hip joint: Secondary | ICD-10-CM | POA: Diagnosis not present

## 2012-11-05 DIAGNOSIS — I498 Other specified cardiac arrhythmias: Secondary | ICD-10-CM | POA: Diagnosis not present

## 2012-11-05 DIAGNOSIS — K219 Gastro-esophageal reflux disease without esophagitis: Secondary | ICD-10-CM | POA: Diagnosis not present

## 2012-11-05 DIAGNOSIS — K5900 Constipation, unspecified: Secondary | ICD-10-CM | POA: Diagnosis not present

## 2012-11-05 DIAGNOSIS — Z471 Aftercare following joint replacement surgery: Secondary | ICD-10-CM | POA: Diagnosis not present

## 2012-11-05 DIAGNOSIS — E1142 Type 2 diabetes mellitus with diabetic polyneuropathy: Secondary | ICD-10-CM | POA: Diagnosis not present

## 2012-11-05 DIAGNOSIS — R9431 Abnormal electrocardiogram [ECG] [EKG]: Secondary | ICD-10-CM

## 2012-11-05 DIAGNOSIS — Z01818 Encounter for other preprocedural examination: Secondary | ICD-10-CM | POA: Diagnosis not present

## 2012-11-05 DIAGNOSIS — E1149 Type 2 diabetes mellitus with other diabetic neurological complication: Secondary | ICD-10-CM | POA: Diagnosis present

## 2012-11-05 DIAGNOSIS — M6281 Muscle weakness (generalized): Secondary | ICD-10-CM | POA: Diagnosis not present

## 2012-11-05 DIAGNOSIS — S298XXA Other specified injuries of thorax, initial encounter: Secondary | ICD-10-CM | POA: Diagnosis not present

## 2012-11-05 DIAGNOSIS — M25569 Pain in unspecified knee: Secondary | ICD-10-CM | POA: Diagnosis not present

## 2012-11-05 DIAGNOSIS — S72009A Fracture of unspecified part of neck of unspecified femur, initial encounter for closed fracture: Secondary | ICD-10-CM | POA: Diagnosis present

## 2012-11-05 DIAGNOSIS — S72033A Displaced midcervical fracture of unspecified femur, initial encounter for closed fracture: Secondary | ICD-10-CM | POA: Diagnosis not present

## 2012-11-05 DIAGNOSIS — E119 Type 2 diabetes mellitus without complications: Secondary | ICD-10-CM | POA: Diagnosis present

## 2012-11-05 DIAGNOSIS — Y9210 Unspecified residential institution as the place of occurrence of the external cause: Secondary | ICD-10-CM | POA: Diagnosis present

## 2012-11-05 DIAGNOSIS — I10 Essential (primary) hypertension: Secondary | ICD-10-CM | POA: Diagnosis not present

## 2012-11-05 DIAGNOSIS — S8990XA Unspecified injury of unspecified lower leg, initial encounter: Secondary | ICD-10-CM | POA: Diagnosis not present

## 2012-11-05 DIAGNOSIS — N400 Enlarged prostate without lower urinary tract symptoms: Secondary | ICD-10-CM | POA: Diagnosis not present

## 2012-11-05 DIAGNOSIS — R52 Pain, unspecified: Secondary | ICD-10-CM | POA: Diagnosis not present

## 2012-11-05 DIAGNOSIS — S7290XA Unspecified fracture of unspecified femur, initial encounter for closed fracture: Secondary | ICD-10-CM | POA: Diagnosis not present

## 2012-11-05 DIAGNOSIS — W010XXA Fall on same level from slipping, tripping and stumbling without subsequent striking against object, initial encounter: Secondary | ICD-10-CM | POA: Diagnosis present

## 2012-11-05 DIAGNOSIS — M79609 Pain in unspecified limb: Secondary | ICD-10-CM | POA: Diagnosis not present

## 2012-11-05 DIAGNOSIS — F411 Generalized anxiety disorder: Secondary | ICD-10-CM | POA: Diagnosis not present

## 2012-11-05 DIAGNOSIS — G909 Disorder of the autonomic nervous system, unspecified: Secondary | ICD-10-CM | POA: Diagnosis not present

## 2012-11-05 HISTORY — DX: Cardiac arrhythmia, unspecified: I49.9

## 2012-11-05 LAB — CBC
HCT: 35.3 % — ABNORMAL LOW (ref 39.0–52.0)
MCHC: 33.7 g/dL (ref 30.0–36.0)
RDW: 13.7 % (ref 11.5–15.5)

## 2012-11-05 LAB — URINALYSIS, ROUTINE W REFLEX MICROSCOPIC
Hgb urine dipstick: NEGATIVE
Protein, ur: NEGATIVE mg/dL
Urobilinogen, UA: 0.2 mg/dL (ref 0.0–1.0)

## 2012-11-05 LAB — DIFFERENTIAL
Basophils Absolute: 0 10*3/uL (ref 0.0–0.1)
Eosinophils Relative: 2 % (ref 0–5)
Lymphocytes Relative: 17 % (ref 12–46)
Lymphs Abs: 1.7 10*3/uL (ref 0.7–4.0)
Monocytes Absolute: 0.7 10*3/uL (ref 0.1–1.0)
Monocytes Relative: 7 % (ref 3–12)

## 2012-11-05 LAB — APTT: aPTT: 31 seconds (ref 24–37)

## 2012-11-05 LAB — GLUCOSE, CAPILLARY: Glucose-Capillary: 126 mg/dL — ABNORMAL HIGH (ref 70–99)

## 2012-11-05 LAB — PROTIME-INR: Prothrombin Time: 13 seconds (ref 11.6–15.2)

## 2012-11-05 MED ORDER — POLYETHYLENE GLYCOL 3350 17 G PO PACK
17.0000 g | PACK | Freq: Three times a day (TID) | ORAL | Status: DC
  Administered 2012-11-06 – 2012-11-09 (×6): 17 g via ORAL
  Filled 2012-11-05 (×13): qty 1

## 2012-11-05 MED ORDER — SIMVASTATIN 20 MG PO TABS
20.0000 mg | ORAL_TABLET | Freq: Every day | ORAL | Status: DC
  Administered 2012-11-06 – 2012-11-08 (×3): 20 mg via ORAL
  Filled 2012-11-05 (×5): qty 1

## 2012-11-05 MED ORDER — SENNA 8.6 MG PO TABS
2.0000 | ORAL_TABLET | Freq: Every day | ORAL | Status: DC
  Administered 2012-11-06 – 2012-11-08 (×3): 17.2 mg via ORAL
  Filled 2012-11-05 (×4): qty 1

## 2012-11-05 MED ORDER — ASPIRIN 81 MG PO CHEW
81.0000 mg | CHEWABLE_TABLET | Freq: Every day | ORAL | Status: DC
  Administered 2012-11-06 – 2012-11-09 (×3): 81 mg via ORAL
  Filled 2012-11-05 (×4): qty 1

## 2012-11-05 MED ORDER — SODIUM CHLORIDE 0.9 % IV SOLN
INTRAVENOUS | Status: AC
  Administered 2012-11-06: 150 mL/h via INTRAVENOUS

## 2012-11-05 MED ORDER — ALLOPURINOL 300 MG PO TABS
300.0000 mg | ORAL_TABLET | Freq: Every day | ORAL | Status: DC
  Administered 2012-11-06 – 2012-11-09 (×4): 300 mg via ORAL
  Filled 2012-11-05 (×4): qty 1

## 2012-11-05 MED ORDER — INSULIN ASPART 100 UNIT/ML ~~LOC~~ SOLN: 0.0000 [IU] | Freq: Three times a day (TID) | SUBCUTANEOUS | Status: DC

## 2012-11-05 MED ORDER — TERAZOSIN HCL 5 MG PO CAPS
5.0000 mg | ORAL_CAPSULE | Freq: Every day | ORAL | Status: DC
  Administered 2012-11-06 – 2012-11-09 (×4): 5 mg via ORAL
  Filled 2012-11-05 (×4): qty 1

## 2012-11-05 MED ORDER — PANTOPRAZOLE SODIUM 40 MG PO TBEC
40.0000 mg | DELAYED_RELEASE_TABLET | Freq: Every day | ORAL | Status: DC
  Administered 2012-11-06 – 2012-11-09 (×4): 40 mg via ORAL
  Filled 2012-11-05 (×4): qty 1

## 2012-11-05 MED ORDER — HYDROCODONE-ACETAMINOPHEN 5-325 MG PO TABS
1.0000 | ORAL_TABLET | Freq: Once | ORAL | Status: AC
  Administered 2012-11-05: 1 via ORAL
  Filled 2012-11-05: qty 1

## 2012-11-05 MED ORDER — HYDROMORPHONE HCL PF 1 MG/ML IJ SOLN
1.0000 mg | INTRAMUSCULAR | Status: AC | PRN
  Administered 2012-11-05 – 2012-11-06 (×3): 1 mg via INTRAVENOUS
  Filled 2012-11-05 (×3): qty 1

## 2012-11-05 MED ORDER — INSULIN ASPART 100 UNIT/ML ~~LOC~~ SOLN: 0.0000 [IU] | Freq: Every day | SUBCUTANEOUS | Status: DC

## 2012-11-05 MED ORDER — ENALAPRIL MALEATE 5 MG PO TABS
5.0000 mg | ORAL_TABLET | Freq: Every day | ORAL | Status: DC
  Administered 2012-11-06: 5 mg via ORAL
  Filled 2012-11-05: qty 1

## 2012-11-05 MED ORDER — LACTULOSE 10 GM/15ML PO SOLN
15.0000 g | Freq: Every day | ORAL | Status: DC | PRN
  Administered 2012-11-07: 15 g via ORAL
  Filled 2012-11-05 (×2): qty 30

## 2012-11-05 MED ORDER — HYDROCODONE-ACETAMINOPHEN 5-325 MG PO TABS
1.0000 | ORAL_TABLET | Freq: Four times a day (QID) | ORAL | Status: DC | PRN
  Administered 2012-11-05 – 2012-11-07 (×7): 1 via ORAL
  Administered 2012-11-08 – 2012-11-09 (×3): 2 via ORAL
  Administered 2012-11-09: 1 via ORAL
  Filled 2012-11-05 (×2): qty 2
  Filled 2012-11-05 (×4): qty 1
  Filled 2012-11-05: qty 2
  Filled 2012-11-05 (×4): qty 1

## 2012-11-05 MED ORDER — MORPHINE SULFATE 2 MG/ML IJ SOLN
0.5000 mg | INTRAMUSCULAR | Status: DC | PRN
  Administered 2012-11-07 – 2012-11-08 (×2): 0.5 mg via INTRAVENOUS
  Filled 2012-11-05 (×2): qty 1

## 2012-11-05 MED ORDER — ALPRAZOLAM 0.25 MG PO TABS
0.2500 mg | ORAL_TABLET | Freq: Two times a day (BID) | ORAL | Status: DC | PRN
  Administered 2012-11-06 – 2012-11-08 (×2): 0.25 mg via ORAL
  Filled 2012-11-05 (×2): qty 1

## 2012-11-05 MED ORDER — MECLIZINE HCL 25 MG PO TABS
25.0000 mg | ORAL_TABLET | Freq: Four times a day (QID) | ORAL | Status: DC | PRN
  Filled 2012-11-05: qty 1

## 2012-11-05 MED ORDER — ENOXAPARIN SODIUM 40 MG/0.4ML ~~LOC~~ SOLN
40.0000 mg | SUBCUTANEOUS | Status: DC
  Administered 2012-11-06 – 2012-11-07 (×2): 40 mg via SUBCUTANEOUS
  Filled 2012-11-05 (×3): qty 0.4

## 2012-11-05 MED ORDER — ONDANSETRON HCL 4 MG/2ML IJ SOLN: 4.0000 mg | INTRAMUSCULAR | Status: AC | PRN

## 2012-11-05 MED ORDER — POLYETHYLENE GLYCOL 3350 17 GM/SCOOP PO POWD
17.0000 g | Freq: Three times a day (TID) | ORAL | Status: DC
  Filled 2012-11-05: qty 255

## 2012-11-05 NOTE — ED Provider Notes (Signed)
History   This chart was scribed for Carleene Cooper III, MD by Leone Payor, ED Scribe. This patient was seen in room APA09/APA09 and the patient's care was started at 1543.   CSN: [id number]  Arrival date & time 11/05/12  1541   First MD Initiated Contact with Patient 11/05/12 1543      Chief Complaint  Patient presents with  . Fall     The history is provided by the patient. No language interpreter was used.    Christian Thomas is a 78 y.o. male brought in by ambulance, who presents to the Emergency Department complaining of a new, unchanged, moderate right knee pain that radiates to the right hip and right foot pain after a fall that happened about 7 hours ago. Pt states he fell this morning, was helped into a wheel chair and taken back to his room at [facility]. Pt reports that the pain in the right leg is aggravated with movement. He reports that he has had pain in his knees before in the past. Pt takes medications at home for DM and HTN daily. Pt denies rib pain, chest pain, neck pain.   Pt has h/o hernia repair, appendectomy, diabetic neuropathy, diverticulosis, venous insufficieny.  Pt is a former smoker and rare alcohol user.  Past Medical History  Diagnosis Date  . Diverticulosis   . Mixed hyperlipidemia   . Gout   . Clostridium difficile colitis   . Chronic constipation   . Essential hypertension, benign   . Type 2 diabetes mellitus   . Venous insufficiency   . Benign essential tremor   . Macular degeneration   . Diabetic neuropathy     Past Surgical History  Procedure Date  . Hernia repair   . Hemorrhoid surgery   . Appendectomy   . Colonoscopy     NL TI, pan-TICS, sml IH, CDIFF COLITIS  . Sigmoidoscopy     Internal hemorrhoids    Family History  Problem Relation Age of Onset  . Cancer Mother     Breast    History  Substance Use Topics  . Smoking status: Former Smoker    Types: Cigarettes  . Smokeless tobacco: Never Used  . Alcohol Use: Yes      Comment: Rare      Review of Systems  Constitutional: Negative.   HENT: Negative.  Negative for neck pain.   Respiratory: Negative.   Cardiovascular: Negative.  Negative for chest pain.  Gastrointestinal: Negative.   Musculoskeletal: Positive for arthralgias (right knee, right foot, right hip. ).       Denies rib pain.   Skin: Negative.  Negative for wound.  Neurological: Negative.   Hematological: Negative.   Psychiatric/Behavioral: Negative.   All other systems reviewed and are negative.    Allergies  Review of patient's allergies indicates no known allergies.  Home Medications   Current Outpatient Rx  Name  Route  Sig  Dispense  Refill  . ALLOPURINOL 300 MG PO TABS   Oral   Take 300 mg by mouth daily.           . ASPIRIN 81 MG PO TABS   Oral   Take 81 mg by mouth daily.           . ATENOLOL 25 MG PO TABS   Oral   Take 1 tablet (25 mg total) by mouth daily.   30 tablet   3     Dose decrease   . CLOPIDOGREL BISULFATE  75 MG PO TABS   Oral   Take 75 mg by mouth daily.           . ENALAPRIL MALEATE 10 MG PO TABS   Oral   Take 10 mg by mouth daily.           . ETODOLAC 300 MG PO CAPS   Oral   Take 300 mg by mouth 2 (two) times daily with a meal.           . FUROSEMIDE 40 MG PO TABS   Oral   Take 40 mg by mouth daily.           Marland Kitchen LACTULOSE 10 GM/15ML PO SOLN   Oral   Take 20 g by mouth at bedtime.           Marland Kitchen LOVASTATIN 40 MG PO TABS   Oral   Take 40 mg by mouth at bedtime.           . LUBIPROSTONE 8 MCG PO CAPS   Oral   Take 1 capsule (8 mcg total) by mouth 2 (two) times daily with a meal.   60 capsule   11   . ICAPS PO CAPS   Oral   Take by mouth. Take 1 tablet twice a day.          Marland Kitchen OMEPRAZOLE 20 MG PO CPDR   Oral   Take 20 mg by mouth daily.           Marland Kitchen POLYETHYLENE GLYCOL 3350 PO PACK   Oral   Take 17 g by mouth daily.           Marland Kitchen TERAZOSIN HCL 5 MG PO CAPS   Oral   Take 5 mg by mouth at bedtime.            . BENEFIBER PO POWD   Oral   Take by mouth.             BP 155/73  Pulse 70  Temp 98.1 F (36.7 C) (Oral)  Resp 18  SpO2 97%  Physical Exam  Nursing note and vitals reviewed. Constitutional: He appears well-developed and well-nourished.       Quite deaf  HENT:  Head: Normocephalic and atraumatic.  Right Ear: External ear normal.  Left Ear: External ear normal.  Nose: Nose normal.  Mouth/Throat: Oropharynx is clear and moist.       Ears and throat are normal.   Eyes: Conjunctivae normal are normal. Pupils are equal, round, and reactive to light.  Neck: Neck supple. No tracheal deviation present. No thyromegaly present.  Cardiovascular: Normal rate, regular rhythm and normal heart sounds.   No murmur heard. Pulmonary/Chest: Effort normal and breath sounds normal.       Lungs are clear.   Abdominal: Soft. Bowel sounds are normal. He exhibits no distension. There is no tenderness.  Musculoskeletal: Normal range of motion. He exhibits no edema and no tenderness.       Leg is externally rotated and has pain with movement. No deformity to the knee or foot.  Neurological: He is alert. Coordination normal.       Sensory and motor function seem intact.   Skin: Skin is warm and dry. No rash noted.  Psychiatric: He has a normal mood and affect.    ED Course  Procedures (including critical care time)  DIAGNOSTIC STUDIES: Oxygen Saturation is 97% on room air, adequate by my interpretation.    COORDINATION OF CARE: 3:53  PM Discussed treatment plan which includes x-rays and pain medication with pt at bedside and pt agreed to plan.  5:07 PM Advised pt of radiology and lab work results. Pt will be transferred to [facility] for the hip surgery.     Labs Reviewed - No data to display Dg Chest 2 View  11/05/2012  *RADIOLOGY REPORT*  Clinical Data: Larey Seat today with right hip and leg pain  CHEST - 2 VIEW  Comparison: None.  Findings: No active infiltrate or effusion is seen.   The heart is mildly enlarged.  No acute bony abnormality is noted with the bones being osteopenic.  IMPRESSION: Cardiomegaly.  No active lung disease.   Original Report Authenticated By: Dwyane Dee, M.D.    Dg Hip Complete Right  11/05/2012  *RADIOLOGY REPORT*  Clinical Data: Larey Seat today with right hip and knee pain  RIGHT HIP - COMPLETE 2+ VIEW  Comparison: None.  Findings: There is an acute right femoral neck fracture with varus deformity.  There are mild degenerative changes of both hips for age.  The pelvic rami are intact.  IMPRESSION: Acute right femoral neck fracture with varus deformity.   Original Report Authenticated By: Dwyane Dee, M.D.    Dg Knee 2 Views Right  11/05/2012  *RADIOLOGY REPORT*  Clinical Data: Larey Seat today with right hip and knee pain  RIGHT KNEE - 1-2 VIEW  Comparison: None.  Findings: There is tricompartmental degenerative joint disease of the right knee with loss of joint space, sclerosis and spurring. No fracture is seen.  No joint effusion is noted.  IMPRESSION: No acute abnormality.  Tricompartmental degenerative joint disease.   Original Report Authenticated By: Dwyane Dee, M.D.    Dg Foot Complete Right  11/05/2012  *RADIOLOGY REPORT*  Clinical Data: Larey Seat today with right hip and knee pain  RIGHT FOOT COMPLETE - 3+ VIEW  Comparison: None.  Findings: Tarsal - metatarsal alignment is normal.  No fracture is seen.  There is what appears to be a erosion in the distal right first metatarsal just proximal to the first MTP joint.  This could represent arthritis, such as gout.  Clinical correlation is recommended.  There is degenerative change at the right first MTP joint as well.  IMPRESSION:  1.  No fracture. 2.  Erosion involving the distal right first metatarsal.  Question gout.   Original Report Authenticated By: Dwyane Dee, M.D.    5:40 PM Called Durene Romans, M.D., who accepted pt for admission in transfer to [facility].  Call Dr. Izola Price, [facility], who accepts pt for admission to a telemetry bed.  1. Femoral neck fracture     I personally performed the services described in this documentation, which was scribed in my presence. The recorded information has been reviewed and is accurate.  Osvaldo Human, MD      Carleene Cooper III, MD 11/05/12 [id number]

## 2012-11-05 NOTE — ED Notes (Signed)
Per ems, pt from [place] and experienced a fall at 0900 today.  Per ems, pt c/o rt knee pain that radiates to rt hip.

## 2012-11-05 NOTE — ED Notes (Signed)
Pt transferred via EMS. Pt stable at time of transfer.

## 2012-11-05 NOTE — H&P (Signed)
[facility] History and Physical  Christian Thomas ZOX:[id number] DOB: January 29, 1915 DOA: 11/05/2012   PCP: Harlow Asa, MD   Chief Complaint: fall, right hip/knee pain  HPI:  78 year old male with a history of diabetes type 2, hyperlipidemia, hypertension, and constipation presented to [facility] ED after a fall.  Workup revealed a right femoral neck fracture. The patient was transferred to [facility] for surgical intervention.  The patient denies any syncope, but complains of some dizziness. He has had dizziness on and off for many years. The patient has had previous workup for syncope in the past including a Holter monitor in 2012 which was negative for any dysrhythmia. However the patient adamantly denies any syncope during this episode. Interestingly, his nursing facility, [facility], recently placed him on Xanax on Saturday. The patient related a history of 24 hours of dizziness. After breakfast today, the patient felt somewhat dizzy and upon walking back to his room, the patient fell onto his right side. He initially complained of right knee pain, but this was chronic. However when the patient tried to go to the bathroom later in the day, he had excruciating pain in his right hip area which prompted transfer to the emergency department. The patient denies any chest discomfort, shortness of breath, palpitations, nausea, vomiting, diarrhea, abdominal pain, dysuria, hematuria, dysarthria, visual changes, focal extremity weakness. The patient has had a history of falls and syncope in the past, , but he has not had an episode for nearly one year. Assessment/Plan: Right femoral neck fracture -Ortho, Dr. Durene Romans, has already been consulted -The patient does not have any labs at the time of my interview -Will order preoperative labs including CBC, CMP, EKG, chest x-ray, urinalysis, INR, PTT -Pain control -PT/OT evaluation after the surgery -N.p.o. after midnight in anticipation  for surgery Dizziness -Has been a chronic issue, but may have recent exacerbation due to Xanax which was just started Saturday for was perceived to be anxiety due to patient's neurotic scratching -Check orthostatics laying and sitting possible Diabetes mellitus type 2 -Discontinue metformin -NovoLog sliding scale Hypertension -Continue enalapril and terazosin Constipation -Continue lactulose, MiraLax -Add Senokot History of syncope -No syncope for the current episode -No syncope since September 2012 -Place on telemetry -No history of dysrhythmia or CAD -Unclear why patient is on both aspirin and Plavix--hold Plavix for surgery       Past Medical History  Diagnosis Date  . Diverticulosis   . Mixed hyperlipidemia   . Gout   . Clostridium difficile colitis   . Chronic constipation   . Essential hypertension, benign   . Type 2 diabetes mellitus   . Venous insufficiency   . Benign essential tremor   . Macular degeneration   . Diabetic neuropathy   . Dysrhythmia    Past Surgical History  Procedure Date  . Hernia repair   . Hemorrhoid surgery   . Appendectomy   . Colonoscopy     NL TI, pan-TICS, sml IH, CDIFF COLITIS  . Sigmoidoscopy     Internal hemorrhoids   Social History:  reports that he has quit smoking. His smoking use included Cigarettes. He has never used smokeless tobacco. He reports that he drinks alcohol. He reports that he does not use illicit drugs.   Family History  Problem Relation Age of Onset  . Cancer Mother     Breast     Allergies  Allergen Reactions  . Morphine And Related   . Other  Opioids       Prior to Admission medications   Medication Sig Start Date End Date Taking? Authorizing Provider  acetaminophen (TYLENOL) 500 MG tablet Take 1,000 mg by mouth 2 (two) times daily.   Yes Historical Provider, MD  allopurinol (ZYLOPRIM) 300 MG tablet Take 300 mg by mouth daily.   Yes Historical Provider, MD  ALPRAZolam (XANAX) 0.25 MG  tablet Take 0.25 mg by mouth 2 (two) times daily.   Yes Historical Provider, MD  clopidogrel (PLAVIX) 75 MG tablet Take 75 mg by mouth daily.   Yes Historical Provider, MD  enalapril (VASOTEC) 5 MG tablet Take 5 mg by mouth daily.   Yes Historical Provider, MD  etodolac (LODINE) 300 MG capsule Take 300 mg by mouth 2 (two) times daily with a meal.   Yes Historical Provider, MD  furosemide (LASIX) 20 MG tablet Take 20 mg by mouth daily.   Yes Historical Provider, MD  lactulose (CHRONULAC) 10 GM/15ML solution Take 15 g by mouth daily as needed. constipation   Yes Historical Provider, MD  lovastatin (MEVACOR) 40 MG tablet Take 40 mg by mouth at bedtime.   Yes Historical Provider, MD  meclizine (ANTIVERT) 25 MG tablet Take 25 mg by mouth every 6 (six) hours as needed. dizziness   Yes Historical Provider, MD  metFORMIN (GLUCOPHAGE) 500 MG tablet Take 500 mg by mouth 2 (two) times daily.   Yes Historical Provider, MD  omeprazole (PRILOSEC) 20 MG capsule Take 20 mg by mouth daily.   Yes Historical Provider, MD  polyethylene glycol powder (GLYCOLAX/MIRALAX) powder Take 17 g by mouth 3 (three) times daily.   Yes Historical Provider, MD  Specialty Vitamins Products (ICAPS LUTEIN-ZEAXANTHIN PO) Take 2 tablets by mouth daily.   Yes Historical Provider, MD  terazosin (HYTRIN) 5 MG capsule Take 5 mg by mouth daily.   Yes Historical Provider, MD  traMADol (ULTRAM) 50 MG tablet Take 50 mg by mouth 2 (two) times daily as needed. pain   Yes Historical Provider, MD    Review of Systems:  Constitutional:  No weight loss, night sweats, Fevers, chills, fatigue.  Head&Eyes: No headache.  No vision loss.  No eye pain or scotoma ENT:  No Difficulty swallowing,Tooth/dental problems,Sore throat,  No ear ache, post nasal drip,  Cardio-vascular:  No chest pain, Orthopnea, PND, swelling in lower extremities,  dizziness, palpitations  GI:  No  abdominal pain, nausea, vomiting, diarrhea, loss of appetite, hematochezia,  melena, heartburn, indigestion, Resp:  No shortness of breath with exertion or at rest. No cough. No coughing up of blood .  Skin:  no rash or lesions.  GU:  no dysuria, change in color of urine, no urgency or frequency. No flank pain.  Musculoskeletal:  No joint pain.  No back pain. Complains of right knee and right hip pain. Psych:  No change in mood or affect.  Neurologic: No headache, no dysesthesia, no focal weakness, no vision loss. No syncope  Physical Exam: Filed Vitals:   11/05/12 1540 11/05/12 1747 11/05/12 2015  BP: 155/73 192/83 163/86  Pulse: 70 92 75  Temp: 98.1 F (36.7 C)  98.6 F (37 C)  TempSrc: Oral  Oral  Resp: 18  18  SpO2: 97% 91% 96%   General:  A&O x 3, NAD, nontoxic, pleasant/cooperative Head/Eye: No conjunctival hemorrhage, no icterus, [place]/AT, No nystagmus ENT:  No icterus,  No thrush,no exudates Neck:  No masses, no lymphadenpathy, no bruits CV:  RRR, no rub, no gallop, no S3  Lung:  CTAB, good air movement, no wheeze, no rhonchi Abdomen: soft/NT, +BS, nondistended, no peritoneal signs Ext: No cyanosis, No rashes, No petechiae, No lymphangitis, 1+ edema bilateral lower extremities Neuro: CNII-XII intact, strength 4/5 in bilateral upper extremities, no dysmetria  Labs on Admission:  Basic Metabolic Panel: No results found for this basename: NA:5,K:2,CL:5,CO2:5,GLUCOSE:5,BUN:5,CREATININE:5,CALCIUM:5,MG:5,PHOS:5 in the last 168 hours Liver Function Tests: No results found for this basename: AST:5,ALT:5,ALKPHOS:5,BILITOT:5,PROT:5,ALBUMIN:5 in the last 168 hours No results found for this basename: LIPASE:5,AMYLASE:5 in the last 168 hours No results found for this basename: AMMONIA:5 in the last 168 hours CBC: No results found for this basename: WBC:5,NEUTROABS:5,HGB:5,HCT:5,MCV:5,PLT:5 in the last 168 hours Cardiac Enzymes: No results found for this basename: CKTOTAL:5,CKMB:5,CKMBINDEX:5,TROPONINI:5 in the last 168 hours BNP: No components found  with this basename: POCBNP:5 CBG:  Lab 11/05/12 1958  GLUCAP 126*    Radiological Exams on Admission: Dg Chest 2 View  11/05/2012  *RADIOLOGY REPORT*  Clinical Data: Larey Seat today with right hip and leg pain  CHEST - 2 VIEW  Comparison: None.  Findings: No active infiltrate or effusion is seen.  The heart is mildly enlarged.  No acute bony abnormality is noted with the bones being osteopenic.  IMPRESSION: Cardiomegaly.  No active lung disease.   Original Report Authenticated By: Dwyane Dee, M.D.    Dg Hip Complete Right  11/05/2012  *RADIOLOGY REPORT*  Clinical Data: Larey Seat today with right hip and knee pain  RIGHT HIP - COMPLETE 2+ VIEW  Comparison: None.  Findings: There is an acute right femoral neck fracture with varus deformity.  There are mild degenerative changes of both hips for age.  The pelvic rami are intact.  IMPRESSION: Acute right femoral neck fracture with varus deformity.   Original Report Authenticated By: Dwyane Dee, M.D.    Dg Knee 2 Views Right  11/05/2012  *RADIOLOGY REPORT*  Clinical Data: Larey Seat today with right hip and knee pain  RIGHT KNEE - 1-2 VIEW  Comparison: None.  Findings: There is tricompartmental degenerative joint disease of the right knee with loss of joint space, sclerosis and spurring. No fracture is seen.  No joint effusion is noted.  IMPRESSION: No acute abnormality.  Tricompartmental degenerative joint disease.   Original Report Authenticated By: Dwyane Dee, M.D.    Dg Foot Complete Right  11/05/2012  *RADIOLOGY REPORT*  Clinical Data: Larey Seat today with right hip and knee pain  RIGHT FOOT COMPLETE - 3+ VIEW  Comparison: None.  Findings: Tarsal - metatarsal alignment is normal.  No fracture is seen.  There is what appears to be a erosion in the distal right first metatarsal just proximal to the first MTP joint.  This could represent arthritis, such as gout.  Clinical correlation is recommended.  There is degenerative change at the right first MTP joint as well.   IMPRESSION:  1.  No fracture. 2.  Erosion involving the distal right first metatarsal.  Question gout.   Original Report Authenticated By: Dwyane Dee, M.D.     EKG: Independently reviewed. pending    Time spent:70 minutes Code Status:   DNR Family Communication:   Family at bedside   Deija Buhrman, DO  [facility] Pager [phone]  If 7PM-7AM, please contact night-coverage [email or web address] Password [facility] 11/05/2012, 9:09 PM

## 2012-11-06 DIAGNOSIS — R9431 Abnormal electrocardiogram [ECG] [EKG]: Secondary | ICD-10-CM

## 2012-11-06 LAB — HEMOGLOBIN A1C
Hgb A1c MFr Bld: 6.4 % — ABNORMAL HIGH (ref <5.7)
Mean Plasma Glucose: 137 mg/dL — ABNORMAL HIGH (ref <117)

## 2012-11-06 LAB — COMPREHENSIVE METABOLIC PANEL
BUN: 27 mg/dL — ABNORMAL HIGH (ref 6–23)
CO2: 25 mEq/L (ref 19–32)
Calcium: 8.6 mg/dL (ref 8.4–10.5)
Creatinine, Ser: 1.1 mg/dL (ref 0.50–1.35)
GFR calc Af Amer: 64 mL/min — ABNORMAL LOW (ref >90)
GFR calc non Af Amer: 55 mL/min — ABNORMAL LOW (ref >90)
Glucose, Bld: 220 mg/dL — ABNORMAL HIGH (ref 70–99)

## 2012-11-06 LAB — GLUCOSE, CAPILLARY
Glucose-Capillary: 103 mg/dL — ABNORMAL HIGH (ref 70–99)
Glucose-Capillary: 99 mg/dL (ref 70–99)
Glucose-Capillary: 105 mg/dL — ABNORMAL HIGH (ref 70–99)
Glucose-Capillary: 194 mg/dL — ABNORMAL HIGH (ref 70–99)

## 2012-11-06 MED ORDER — PHENOL 1.4 % MT LIQD
1.0000 | OROMUCOSAL | Status: DC | PRN
  Administered 2012-11-06: 1 via OROMUCOSAL
  Filled 2012-11-06: qty 177

## 2012-11-06 MED ORDER — ENALAPRIL MALEATE 10 MG PO TABS
10.0000 mg | ORAL_TABLET | Freq: Every day | ORAL | Status: DC
  Administered 2012-11-07 – 2012-11-09 (×3): 10 mg via ORAL
  Filled 2012-11-06 (×4): qty 1

## 2012-11-06 MED ORDER — INSULIN ASPART 100 UNIT/ML ~~LOC~~ SOLN: 0.0000 [IU] | Freq: Three times a day (TID) | SUBCUTANEOUS | Status: DC

## 2012-11-06 NOTE — Progress Notes (Signed)
INITIAL NUTRITION ASSESSMENT  DOCUMENTATION CODES Per approved criteria  -Not Applicable   INTERVENTION: Will follow for diet advancement.   NUTRITION DIAGNOSIS: Inadequate oral food intake related to NPO as evidenced by upcoming sx.   Goal: 1) Pt will maintain current wt of 188# 2) Pt will be advanced to PO diet as medically appropriate 3) Pt will meet >75% of estimated energy and protein needs   Monitor:  Diet advancement, PO intake, wt changes, labs, changes in status.  Reason for Assessment: Hip Fx Protocol  78 y.o. male  Admitting Dx: <principal problem not specified>  ASSESSMENT: Pt is a resident of [address] in [place]. Most of the hx is provided by pt nephew, who is also present, due to pt being severely HOH. He follows a regular diet at [facility]. He reports good appetite PTA. Denies dysphagia. Pt reports he "lost a few pounds, but not many". Wt hx reveals some wt loss, but not significant (-1.6% x 3 months). UBW: 190-195#.  Pt currently NPO. Nephew suspects sx will be performed today. Awaiting orthopedic consult. Pt very anxious for sx.   Height: Ht Readings from Last 1 Encounters:  11/05/12 6\' 2"  (1.88 m)    Weight: Wt Readings from Last 1 Encounters:  11/05/12 188 lb 7.9 oz (85.5 kg)    Ideal Body Weight: 190#  % Ideal Body Weight: 99%  Wt Readings from Last 10 Encounters:  11/05/12 188 lb 7.9 oz (85.5 kg)  11/01/11 194 lb (87.998 kg)  08/12/11 191 lb 1.9 oz (86.691 kg)  07/07/11 193 lb 6.4 oz (87.726 kg)  03/11/11 193 lb 12.8 oz (87.907 kg)  11/04/10 195 lb (88.451 kg)  02/24/10 198 lb (89.812 kg)  01/13/10 199 lb (90.266 kg)  08/27/09 198 lb (89.812 kg)  03/18/09 197 lb (89.359 kg)    Usual Body Weight: 190#  % Usual Body Weight: 99%  BMI:  Body mass index is 24.20 kg/(m^2). Classified as normal weight.   Estimated Nutritional Needs: Kcal: 1500-1600 kcals daily Protein: 68-86 grams protein daily Fluid: 1.5-1.6 L fluid  daily  Skin: Bruising on upper arms and skin tear on rt arm   Diet Order: NPO  EDUCATION NEEDS: -No education needs identified at this time   Intake/Output Summary (Last 24 hours) at 11/06/12 1030 Last data filed at 11/06/12 0547  Gross per 24 hour  Intake      0 ml  Output    900 ml  Net   -900 ml    Last BM: 11/05/12  Labs:   Lab 11/05/12 2325  NA 136  K 4.5  CL 102  CO2 25  BUN 27*  CREATININE 1.10  CALCIUM 8.6  MG --  PHOS --  GLUCOSE 220*    CBG (last 3)   Basename 11/06/12 0801 11/05/12 1958  GLUCAP 103* 126*    Scheduled Meds:   . sodium chloride   Intravenous STAT  . allopurinol  300 mg Oral Daily  . aspirin  81 mg Oral Daily  . enalapril  5 mg Oral Daily  . enoxaparin (LOVENOX) injection  40 mg Subcutaneous Q24H  . insulin aspart  0-5 Units Subcutaneous QHS  . insulin aspart  0-9 Units Subcutaneous TID WC  . pantoprazole  40 mg Oral Daily  . polyethylene glycol  17 g Oral TID  . senna  2 tablet Oral QHS  . simvastatin  20 mg Oral q1800  . terazosin  5 mg Oral Daily    Continuous Infusions:  Past Medical History  Diagnosis Date  . Diverticulosis   . Mixed hyperlipidemia   . Gout   . Clostridium difficile colitis   . Chronic constipation   . Essential hypertension, benign   . Type 2 diabetes mellitus   . Venous insufficiency   . Benign essential tremor   . Macular degeneration   . Diabetic neuropathy   . Dysrhythmia     Past Surgical History  Procedure Date  . Hernia repair   . Hemorrhoid surgery   . Appendectomy   . Colonoscopy     NL TI, pan-TICS, sml IH, CDIFF COLITIS  . Sigmoidoscopy     Internal hemorrhoids    Melody Haver, RD, LDN Pager: [phone] After hours Pager: [phone]

## 2012-11-06 NOTE — Consult Note (Signed)
Reason for Consult: Right hip fracture Referring Physician: Medicine  Christian KLUTTZ is an 78 y.o. male.  HPI:    After breakfast prior to admission, the patient felt somewhat dizzy and upon walking back to his room, the patient fell onto his right side. He initially complained of right knee pain, but this was chronic. However when the patient tried to go to the bathroom later in the day, he had excruciating pain in his right hip area which prompted transfer to the emergency department.  The patient has had a history of falls and syncope in the past, which has been worked up, but he has not had an episode for nearly one year. Risks, benefits and expectations were discussed with the patient and patient's POA. Patient's POA understand the risks, benefits and expectations and wishes to proceed with surgery.    Past Medical History  Diagnosis Date  . Diverticulosis   . Mixed hyperlipidemia   . Gout   . Clostridium difficile colitis   . Chronic constipation   . Essential hypertension, benign   . Type 2 diabetes mellitus   . Venous insufficiency   . Benign essential tremor   . Macular degeneration   . Diabetic neuropathy   . Dysrhythmia     Past Surgical History  Procedure Date  . Hernia repair   . Hemorrhoid surgery   . Appendectomy   . Colonoscopy     NL TI, pan-TICS, sml IH, CDIFF COLITIS  . Sigmoidoscopy     Internal hemorrhoids    Family History  Problem Relation Age of Onset  . Cancer Mother     Breast    Social History:  reports that he has quit smoking. His smoking use included Cigarettes. He has never used smokeless tobacco. He reports that he drinks alcohol. He reports that he does not use illicit drugs.  Allergies:  Allergies  Allergen Reactions  . Morphine And Related   . Other     Opioids      Results for orders placed during the hospital encounter of 11/05/12 (from the past 48 hour(s))  GLUCOSE, CAPILLARY     Status: Abnormal   Collection Time    11/05/12  7:58 PM      Component Value Range Comment   Glucose-Capillary 126 (*) 70 - 99 mg/dL   URINALYSIS, ROUTINE W REFLEX MICROSCOPIC     Status: Abnormal   Collection Time   11/05/12  9:43 PM      Component Value Range Comment   Color, Urine YELLOW  YELLOW    APPearance CLEAR  CLEAR    Specific Gravity, Urine 1.020  1.005 - 1.030    pH 5.5  5.0 - 8.0    Glucose, UA NEGATIVE  NEGATIVE mg/dL    Hgb urine dipstick NEGATIVE  NEGATIVE    Bilirubin Urine SMALL (*) NEGATIVE    Ketones, ur TRACE (*) NEGATIVE mg/dL    Protein, ur NEGATIVE  NEGATIVE mg/dL    Urobilinogen, UA 0.2  0.0 - 1.0 mg/dL    Nitrite NEGATIVE  NEGATIVE    Leukocytes, UA NEGATIVE  NEGATIVE MICROSCOPIC NOT DONE ON URINES WITH NEGATIVE PROTEIN, BLOOD, LEUKOCYTES, NITRITE, OR GLUCOSE <1000 mg/dL.  CBC     Status: Abnormal   Collection Time   11/05/12 11:25 PM      Component Value Range Comment   WBC 10.0  4.0 - 10.5 K/uL    RBC 3.65 (*) 4.22 - 5.81 MIL/uL    Hemoglobin 11.9 (*)  13.0 - 17.0 g/dL    HCT 16.1 (*) 09.6 - 52.0 %    MCV 96.7  78.0 - 100.0 fL    MCH 32.6  26.0 - 34.0 pg    MCHC 33.7  30.0 - 36.0 g/dL    RDW 04.5  40.9 - 81.1 %    Platelets 168  150 - 400 K/uL   PROTIME-INR     Status: Normal   Collection Time   11/05/12 11:25 PM      Component Value Range Comment   Prothrombin Time 13.0  11.6 - 15.2 seconds    INR 0.99  0.00 - 1.49   DIFFERENTIAL     Status: Normal   Collection Time   11/05/12 11:25 PM      Component Value Range Comment   Neutrophils Relative 73  43 - 77 %    Neutro Abs 7.3  1.7 - 7.7 K/uL    Lymphocytes Relative 17  12 - 46 %    Lymphs Abs 1.7  0.7 - 4.0 K/uL    Monocytes Relative 7  3 - 12 %    Monocytes Absolute 0.7  0.1 - 1.0 K/uL    Eosinophils Relative 2  0 - 5 %    Eosinophils Absolute 0.2  0.0 - 0.7 K/uL    Basophils Relative 0  0 - 1 %    Basophils Absolute 0.0  0.0 - 0.1 K/uL   COMPREHENSIVE METABOLIC PANEL     Status: Abnormal   Collection Time   11/05/12 11:25  PM      Component Value Range Comment   Sodium 136  135 - 145 mEq/L    Potassium 4.5  3.5 - 5.1 mEq/L    Chloride 102  96 - 112 mEq/L    CO2 25  19 - 32 mEq/L    Glucose, Bld 220 (*) 70 - 99 mg/dL    BUN 27 (*) 6 - 23 mg/dL    Creatinine, Ser 9.14  0.50 - 1.35 mg/dL    Calcium 8.6  8.4 - 78.2 mg/dL    Total Protein 5.7 (*) 6.0 - 8.3 g/dL    Albumin 3.0 (*) 3.5 - 5.2 g/dL    AST 15  0 - 37 U/L    ALT 15  0 - 53 U/L    Alkaline Phosphatase 57  39 - 117 U/L    Total Bilirubin 0.3  0.3 - 1.2 mg/dL    GFR calc non Af Amer 55 (*) >90 mL/min    GFR calc Af Amer 64 (*) >90 mL/min   APTT     Status: Normal   Collection Time   11/05/12 11:25 PM      Component Value Range Comment   aPTT 31  24 - 37 seconds   GLUCOSE, CAPILLARY     Status: Abnormal   Collection Time   11/06/12  8:01 AM      Component Value Range Comment   Glucose-Capillary 103 (*) 70 - 99 mg/dL   GLUCOSE, CAPILLARY     Status: Normal   Collection Time   11/06/12 11:51 AM      Component Value Range Comment   Glucose-Capillary 99  70 - 99 mg/dL     Dg Chest 2 View  95/62/1308  *RADIOLOGY REPORT*  Clinical Data: Larey Seat today with right hip and leg pain  CHEST - 2 VIEW  Comparison: None.  Findings: No active infiltrate or effusion is seen.  The heart is mildly  enlarged.  No acute bony abnormality is noted with the bones being osteopenic.  IMPRESSION: Cardiomegaly.  No active lung disease.   Original Report Authenticated By: Dwyane Dee, M.D.    Dg Hip Complete Right  11/05/2012  *RADIOLOGY REPORT*  Clinical Data: Larey Seat today with right hip and knee pain  RIGHT HIP - COMPLETE 2+ VIEW  Comparison: None.  Findings: There is an acute right femoral neck fracture with varus deformity.  There are mild degenerative changes of both hips for age.  The pelvic rami are intact.  IMPRESSION: Acute right femoral neck fracture with varus deformity.   Original Report Authenticated By: Dwyane Dee, M.D.    Dg Knee 2 Views Right  11/05/2012   *RADIOLOGY REPORT*  Clinical Data: Larey Seat today with right hip and knee pain  RIGHT KNEE - 1-2 VIEW  Comparison: None.  Findings: There is tricompartmental degenerative joint disease of the right knee with loss of joint space, sclerosis and spurring. No fracture is seen.  No joint effusion is noted.  IMPRESSION: No acute abnormality.  Tricompartmental degenerative joint disease.   Original Report Authenticated By: Dwyane Dee, M.D.    Dg Chest Portable 1 View  11/05/2012  *RADIOLOGY REPORT*  Clinical Data: Preoperative films for patient with hip fracture.  PORTABLE CHEST - 1 VIEW  Comparison: Plain film chest 11/05/2012 at 16:03 hours.  Findings: Cardiomegaly is again seen.  Mild basilar atelectasis noted. No pneumothorax or pleural effusion.  IMPRESSION: No acute finding.   Original Report Authenticated By: Holley Dexter, M.D.    Dg Foot Complete Right  11/05/2012  *RADIOLOGY REPORT*  Clinical Data: Larey Seat today with right hip and knee pain  RIGHT FOOT COMPLETE - 3+ VIEW  Comparison: None.  Findings: Tarsal - metatarsal alignment is normal.  No fracture is seen.  There is what appears to be a erosion in the distal right first metatarsal just proximal to the first MTP joint.  This could represent arthritis, such as gout.  Clinical correlation is recommended.  There is degenerative change at the right first MTP joint as well.  IMPRESSION:  1.  No fracture. 2.  Erosion involving the distal right first metatarsal.  Question gout.   Original Report Authenticated By: Dwyane Dee, M.D.     Review of Systems  Constitutional: Negative.   HENT: Positive for hearing loss.   Eyes: Negative.   Respiratory: Negative.   Cardiovascular: Negative.   Gastrointestinal: Negative.   Genitourinary: Negative.   Musculoskeletal: Positive for falls.  Skin: Negative.   Neurological: Positive for dizziness and sensory change (Known neuropathy from DM).  Endo/Heme/Allergies: Negative.   Psychiatric/Behavioral: Negative.     Blood pressure 179/76, pulse 63, temperature 98.3 F (36.8 C), temperature source Oral, resp. rate 18, height 6\' 2"  (1.88 m), weight 85.5 kg (188 lb 7.9 oz), SpO2 96.00%. Physical Exam  Constitutional: He appears well-developed and well-nourished.  HENT:  Head: Normocephalic and atraumatic.  Mouth/Throat: Oropharynx is clear and moist.  Neck: Neck supple. No JVD present. No tracheal deviation present. No thyromegaly present.  Cardiovascular: Normal rate, regular rhythm and intact distal pulses.   Respiratory: Effort normal and breath sounds normal. No respiratory distress. He has no wheezes.  GI: Soft. There is no tenderness. There is no guarding.  Musculoskeletal:       Right hip: He exhibits decreased range of motion, decreased strength, tenderness and bony tenderness. He exhibits no swelling, no deformity and no laceration.  Neurological: He is alert.  Skin: Skin is warm  and dry.    Assessment/Plan: Right hip fracture  NPO now Plan is for a right hip hemi arthroplasty today Have discussed with patient and POA. Patient's POA understands the risks, benefits and expectations and wishes to proceed with surgery.   Gerrit Halls 11/06/2012, 1:48 PM

## 2012-11-06 NOTE — Progress Notes (Addendum)
Patient ID: Christian Thomas, male   DOB: 15-Sep-1915, 78 y.o.   MRN: [id number]  [facility] PROGRESS NOTE  PRESCOTT TRUEX IEP:[id number] DOB: 29-Aug-1915 DOA: 11/05/2012 PCP: Harlow Asa, MD  Brief narrative: 78 year old male with a history of diabetes type 2, hyperlipidemia, hypertension, and constipation presented to [facility] ED after a fall. Workup revealed a right femoral neck fracture. The patient was transferred to [facility] for surgical intervention. The patient denied any syncope, but complained of some dizziness, explained that it is mostly chronic in nature. The patient has had previous workup for syncope in the past including a Holter monitor in 2012 which was negative for any dysrhythmia. However the patient adamantly denied any syncope during this episode. Interestingly, his nursing facility, [facility], recently placed him on Xanax on Saturday. After breakfast the day of the admission, the patient felt somewhat dizzy and upon walking back to his room, the patient fell onto his right side.   Assessment/Plan:  Principal Problem:  *Femoral neck fracture - in the setting of what appears to be likely a mechanical fall - ortho following and plan for ORIF today - will continue supportive care and monitor on telemetry - will provide analgesia as needed for symptom control Active Problems:  DM2 (diabetes mellitus, type 2) - continue Insulin for now and readjust the regimen as indicated  - will check A1C  Essential hypertension, benign - accelerated - will continue Lisinopril as per home medication regimen but will increase the dose to 10 mg PO QD - will readjust the regimen as needed  Constipation - start bowel regimen, scheduled and as needed   Consultants:  Ortho  Procedures/Studies: Dg Chest 2 View 11/05/2012  --> Cardiomegaly.  No active lung disease.   Dg Hip Complete Right 11/05/2012  --> Acute right femoral neck fracture with varus deformity.   Dg  Knee 2 Views Right 11/05/2012 --> No acute abnormality.  Tricompartmental degenerative joint disease.   Dg Chest Portable 1 View 11/05/2012 --> No acute finding.  Dg Foot Complete Right 11/05/2012  --> No fracture. Erosion involving the distal right first metatarsal.  Question gout.    Antibiotics:  None  Code Status: DNR Family Communication: Pt and son at bedside Disposition Plan: Plan for ORIF today   HPI/Subjective: No events overnight.   Objective: Filed Vitals:   11/06/12 0540 11/06/12 0546 11/06/12 0800 11/06/12 1200  BP: 191/80 179/76    Pulse: 66 63    Temp: 98.3 F (36.8 C)     TempSrc: Oral     Resp: 20  18 18   Height:      Weight:      SpO2: 99%  96%     Intake/Output Summary (Last 24 hours) at 11/06/12 1348 Last data filed at 11/06/12 1300  Gross per 24 hour  Intake      0 ml  Output    900 ml  Net   -900 ml    Exam:   General:  Pt is alert but confused this AM, follows commands appropriately, not in acute distress  Cardiovascular: Regular rate and rhythm, S1/S2, no murmurs, no rubs, no gallops  Respiratory: Clear to auscultation bilaterally, no wheezing, no crackles, no rhonchi  Abdomen: Soft, non tender, non distended, bowel sounds present, no guarding  Extremities: No edema, pulses DP and PT palpable bilaterally  Neuro: Grossly nonfocal  Data Reviewed: Basic Metabolic Panel:  Lab 11/05/12 6606  NA 136  K 4.5  CL 102  CO2 25  GLUCOSE 220*  BUN 27*  CREATININE 1.10  CALCIUM 8.6  MG --  PHOS --   Liver Function Tests:  Lab 11/05/12 2325  AST 15  ALT 15  ALKPHOS 57  BILITOT 0.3  PROT 5.7*  ALBUMIN 3.0*   CBC:  Lab 11/05/12 2325  WBC 10.0  NEUTROABS 7.3  HGB 11.9*  HCT 35.3*  MCV 96.7  PLT 168   CBG:  Lab 11/06/12 1151 11/06/12 0801 11/05/12 1958  GLUCAP 99 103* 126*    Scheduled Meds:   . allopurinol  300 mg Oral Daily  . aspirin  81 mg Oral Daily  . enalapril  5 mg Oral Daily  . enoxaparin  injection  40 mg  Subcutaneous Q24H  . insulin aspart  0-5 Units Subcutaneous QHS  . insulin aspart  0-9 Units Subcutaneous TID WC  . pantoprazole  40 mg Oral Daily  . polyethylene glycol  17 g Oral TID  . senna  2 tablet Oral QHS  . simvastatin  20 mg Oral q1800  . terazosin  5 mg Oral Daily   Continuous Infusions:    Debbora Presto, MD  [facility] Pager [phone]  If 7PM-7AM, please contact night-coverage [email or web address] Password [email or web address] 11/06/2012, 1:48 PM   LOS: 1 day

## 2012-11-06 NOTE — Progress Notes (Signed)
   CARE MANAGEMENT NOTE 11/06/2012  Patient:  Christian Thomas, Christian Thomas   Account Number:  [id number]  Date Initiated:  11/06/2012  Documentation initiated by:  Jiles Crocker  Subjective/Objective Assessment:   ADMITTED WITH HIP FRACTURE     Action/Plan:   PATIENT RESIDES IN AN ASSISTED LIVING FACILITY; SOC WORKER REFERRAL PLACED/ POSSIBLY NEED SNF AT DISCHARGE   Anticipated DC Date:  11/13/2012   Anticipated DC Plan:  SKILLED NURSING FACILITY  In-house referral  Clinical Social Worker      DC Planning Services  CM consult          Status of service:  In process, will continue to follow Medicare Important Message given?  NA - LOS <3 / Initial given by admissions (If response is "NO", the following Medicare IM given date fields will be blank)  Per UR Regulation:  Reviewed for med. necessity/level of care/duration of stay  Comments:  11/06/2012- B Neleh Muldoon RN,BSN,MHA

## 2012-11-07 ENCOUNTER — Encounter (HOSPITAL_COMMUNITY): Payer: Self-pay | Admitting: Anesthesiology

## 2012-11-07 ENCOUNTER — Encounter (HOSPITAL_COMMUNITY)
Admission: EM | Disposition: A | Discharge status: Skilled Nursing Facility | Payer: Self-pay | Source: Home / Self Care | Attending: Internal Medicine

## 2012-11-07 ENCOUNTER — Inpatient Hospital Stay (HOSPITAL_COMMUNITY): Payer: Medicare Other

## 2012-11-07 ENCOUNTER — Inpatient Hospital Stay (HOSPITAL_COMMUNITY): Payer: Medicare Other | Admitting: Anesthesiology

## 2012-11-07 HISTORY — PX: HIP ARTHROPLASTY: SHX981

## 2012-11-07 LAB — GLUCOSE, CAPILLARY
Glucose-Capillary: 116 mg/dL — ABNORMAL HIGH (ref 70–99)
Glucose-Capillary: 109 mg/dL — ABNORMAL HIGH (ref 70–99)
Glucose-Capillary: 116 mg/dL — ABNORMAL HIGH (ref 70–99)
Glucose-Capillary: 131 mg/dL — ABNORMAL HIGH (ref 70–99)
Glucose-Capillary: 178 mg/dL — ABNORMAL HIGH (ref 70–99)

## 2012-11-07 LAB — BASIC METABOLIC PANEL
Chloride: 109 mEq/L (ref 96–112)
Creatinine, Ser: 0.86 mg/dL (ref 0.50–1.35)
GFR calc Af Amer: 83 mL/min — ABNORMAL LOW (ref >90)
Potassium: 3.9 mEq/L (ref 3.5–5.1)
Sodium: 139 mEq/L (ref 135–145)

## 2012-11-07 LAB — CBC
Platelets: 173 10*3/uL (ref 150–400)
RDW: 13.7 % (ref 11.5–15.5)
WBC: 7.6 10*3/uL (ref 4.0–10.5)

## 2012-11-07 LAB — TYPE AND SCREEN

## 2012-11-07 LAB — ABO/RH: ABO/RH(D): A NEG

## 2012-11-07 SURGERY — HEMIARTHROPLASTY, HIP, DIRECT ANTERIOR APPROACH, FOR FRACTURE
Anesthesia: General | Site: Hip | Laterality: Right | Wound class: Clean

## 2012-11-07 MED ORDER — 0.9 % SODIUM CHLORIDE (POUR BTL) OPTIME
TOPICAL | Status: DC | PRN
  Administered 2012-11-07: 1000 mL

## 2012-11-07 MED ORDER — ONDANSETRON HCL 4 MG/2ML IJ SOLN: 4.0000 mg | Freq: Four times a day (QID) | INTRAMUSCULAR | Status: DC | PRN

## 2012-11-07 MED ORDER — HYDRALAZINE HCL 20 MG/ML IJ SOLN
5.0000 mg | Freq: Once | INTRAMUSCULAR | Status: AC
  Administered 2012-11-07: 5 mg via INTRAVENOUS
  Filled 2012-11-07: qty 1

## 2012-11-07 MED ORDER — CEFAZOLIN SODIUM-DEXTROSE 2-3 GM-% IV SOLR
2.0000 g | Freq: Four times a day (QID) | INTRAVENOUS | Status: AC
  Administered 2012-11-07 – 2012-11-08 (×2): 2 g via INTRAVENOUS
  Filled 2012-11-07 (×2): qty 50

## 2012-11-07 MED ORDER — LACTATED RINGERS IV SOLN
INTRAVENOUS | Status: DC | PRN
  Administered 2012-11-07: 14:00:00 via INTRAVENOUS
  Administered 2012-11-07: 16:00:00

## 2012-11-07 MED ORDER — GLYCOPYRROLATE 0.2 MG/ML IJ SOLN
INTRAMUSCULAR | Status: DC | PRN
  Administered 2012-11-07: .6 mg via INTRAVENOUS

## 2012-11-07 MED ORDER — HYDRALAZINE HCL 20 MG/ML IJ SOLN
10.0000 mg | Freq: Once | INTRAMUSCULAR | Status: AC
  Administered 2012-11-07: 10 mg via INTRAVENOUS
  Filled 2012-11-07: qty 1

## 2012-11-07 MED ORDER — PHENYLEPHRINE HCL 10 MG/ML IJ SOLN
INTRAMUSCULAR | Status: DC | PRN
  Administered 2012-11-07 (×3): 40 ug via INTRAVENOUS

## 2012-11-07 MED ORDER — ACETAMINOPHEN 10 MG/ML IV SOLN
INTRAVENOUS | Status: DC | PRN
  Administered 2012-11-07: 1000 mg via INTRAVENOUS

## 2012-11-07 MED ORDER — SUCCINYLCHOLINE CHLORIDE 20 MG/ML IJ SOLN
INTRAMUSCULAR | Status: DC | PRN
  Administered 2012-11-07: 100 mg via INTRAVENOUS
  Administered 2012-11-07: 50 mg via INTRAVENOUS

## 2012-11-07 MED ORDER — METOCLOPRAMIDE HCL 5 MG PO TABS
5.0000 mg | ORAL_TABLET | Freq: Three times a day (TID) | ORAL | Status: DC | PRN
  Filled 2012-11-07: qty 2

## 2012-11-07 MED ORDER — ONDANSETRON HCL 4 MG PO TABS
4.0000 mg | ORAL_TABLET | Freq: Four times a day (QID) | ORAL | Status: DC | PRN
  Filled 2012-11-07: qty 1

## 2012-11-07 MED ORDER — MENTHOL 3 MG MT LOZG: 1.0000 | LOZENGE | OROMUCOSAL | Status: DC | PRN

## 2012-11-07 MED ORDER — CEFAZOLIN SODIUM-DEXTROSE 2-3 GM-% IV SOLR
2.0000 g | INTRAVENOUS | Status: AC
  Administered 2012-11-07: 2 g via INTRAVENOUS

## 2012-11-07 MED ORDER — FENTANYL CITRATE 0.05 MG/ML IJ SOLN
INTRAMUSCULAR | Status: DC | PRN
  Administered 2012-11-07: 100 ug via INTRAVENOUS

## 2012-11-07 MED ORDER — METOCLOPRAMIDE HCL 5 MG/ML IJ SOLN: 5.0000 mg | Freq: Three times a day (TID) | INTRAMUSCULAR | Status: DC | PRN

## 2012-11-07 MED ORDER — FENTANYL CITRATE 0.05 MG/ML IJ SOLN
25.0000 ug | INTRAMUSCULAR | Status: DC | PRN
  Administered 2012-11-07 (×2): 50 ug via INTRAVENOUS

## 2012-11-07 MED ORDER — ONDANSETRON HCL 4 MG/2ML IJ SOLN
INTRAMUSCULAR | Status: DC | PRN
  Administered 2012-11-07: 4 mg via INTRAVENOUS

## 2012-11-07 MED ORDER — NEOSTIGMINE METHYLSULFATE 1 MG/ML IJ SOLN
INTRAMUSCULAR | Status: DC | PRN
  Administered 2012-11-07: 4 mg via INTRAVENOUS

## 2012-11-07 MED ORDER — ROCURONIUM BROMIDE 100 MG/10ML IV SOLN
INTRAVENOUS | Status: DC | PRN
  Administered 2012-11-07: 20 mg via INTRAVENOUS

## 2012-11-07 MED ORDER — PROMETHAZINE HCL 25 MG/ML IJ SOLN: 6.2500 mg | INTRAMUSCULAR | Status: DC | PRN

## 2012-11-07 MED ORDER — ETOMIDATE 2 MG/ML IV SOLN
INTRAVENOUS | Status: DC | PRN
  Administered 2012-11-07: 10 mg via INTRAVENOUS

## 2012-11-07 MED ORDER — FERROUS SULFATE 325 (65 FE) MG PO TABS
325.0000 mg | ORAL_TABLET | Freq: Three times a day (TID) | ORAL | Status: DC
  Administered 2012-11-07 – 2012-11-09 (×6): 325 mg via ORAL
  Filled 2012-11-07 (×10): qty 1

## 2012-11-07 MED ORDER — ENOXAPARIN SODIUM 40 MG/0.4ML ~~LOC~~ SOLN
40.0000 mg | SUBCUTANEOUS | Status: DC
  Administered 2012-11-08 – 2012-11-09 (×2): 40 mg via SUBCUTANEOUS
  Filled 2012-11-07 (×3): qty 0.4

## 2012-11-07 MED ORDER — HYDRALAZINE HCL 20 MG/ML IJ SOLN
10.0000 mg | Freq: Four times a day (QID) | INTRAMUSCULAR | Status: DC | PRN
  Administered 2012-11-07: 10 mg via INTRAVENOUS
  Filled 2012-11-07: qty 1

## 2012-11-07 MED ORDER — HYDROMORPHONE HCL PF 1 MG/ML IJ SOLN
INTRAMUSCULAR | Status: AC
  Administered 2012-11-07: 1 mg
  Filled 2012-11-07: qty 1

## 2012-11-07 SURGICAL SUPPLY — 41 items
ADH SKN CLS APL DERMABOND .7 (GAUZE/BANDAGES/DRESSINGS) ×1
BAG SPEC THK2 15X12 ZIP CLS (MISCELLANEOUS) ×1
BAG ZIPLOCK 12X15 (MISCELLANEOUS) ×2 IMPLANT
BLADE SAW SGTL 18X1.27X75 (BLADE) ×2 IMPLANT
CLOTH BEACON ORANGE TIMEOUT ST (SAFETY) ×2 IMPLANT
DERMABOND ADVANCED (GAUZE/BANDAGES/DRESSINGS) ×1
DERMABOND ADVANCED .7 DNX12 (GAUZE/BANDAGES/DRESSINGS) IMPLANT
DRAPE INCISE IOBAN 85X60 (DRAPES) ×2 IMPLANT
DRAPE ORTHO SPLIT 77X108 STRL (DRAPES) ×4
DRAPE POUCH INSTRU U-SHP 10X18 (DRAPES) ×2 IMPLANT
DRAPE SURG 17X11 SM STRL (DRAPES) ×2 IMPLANT
DRAPE SURG ORHT 6 SPLT 77X108 (DRAPES) ×2 IMPLANT
DRAPE U-SHAPE 47X51 STRL (DRAPES) ×2 IMPLANT
DRSG AQUACEL AG ADV 3.5X10 (GAUZE/BANDAGES/DRESSINGS) ×1 IMPLANT
DRSG TEGADERM 4X4.75 (GAUZE/BANDAGES/DRESSINGS) ×1 IMPLANT
DURAPREP 26ML APPLICATOR (WOUND CARE) ×2 IMPLANT
ELECT BLADE TIP CTD 4 INCH (ELECTRODE) ×2 IMPLANT
ELECT REM PT RETURN 9FT ADLT (ELECTROSURGICAL) ×2
ELECTRODE REM PT RTRN 9FT ADLT (ELECTROSURGICAL) ×1 IMPLANT
EVACUATOR 1/8 PVC DRAIN (DRAIN) ×2 IMPLANT
FACESHIELD LNG OPTICON STERILE (SAFETY) ×7 IMPLANT
GAUZE SPONGE 2X2 8PLY STRL LF (GAUZE/BANDAGES/DRESSINGS) IMPLANT
GLOVE BIOGEL PI IND STRL 7.5 (GLOVE) ×1 IMPLANT
GLOVE BIOGEL PI IND STRL 8 (GLOVE) ×1 IMPLANT
GLOVE BIOGEL PI INDICATOR 7.5 (GLOVE) ×1
GLOVE BIOGEL PI INDICATOR 8 (GLOVE) ×1
GLOVE ECLIPSE 8.0 STRL XLNG CF (GLOVE) ×1 IMPLANT
GLOVE ORTHO TXT STRL SZ7.5 (GLOVE) ×4 IMPLANT
GOWN BRE IMP PREV XXLGXLNG (GOWN DISPOSABLE) ×4 IMPLANT
GOWN STRL NON-REIN LRG LVL3 (GOWN DISPOSABLE) ×2 IMPLANT
KIT BASIN OR (CUSTOM PROCEDURE TRAY) ×2 IMPLANT
MANIFOLD NEPTUNE II (INSTRUMENTS) ×2 IMPLANT
PACK TOTAL JOINT (CUSTOM PROCEDURE TRAY) ×2 IMPLANT
POSITIONER SURGICAL ARM (MISCELLANEOUS) ×2 IMPLANT
SPONGE GAUZE 2X2 STER 10/PKG (GAUZE/BANDAGES/DRESSINGS) ×1
SUT MNCRL AB 4-0 PS2 18 (SUTURE) ×2 IMPLANT
SUT VIC AB 1 CT1 36 (SUTURE) ×3 IMPLANT
SUT VIC AB 2-0 CT1 27 (SUTURE) ×4
SUT VIC AB 2-0 CT1 TAPERPNT 27 (SUTURE) ×2 IMPLANT
SUT VLOC 180 0 24IN GS25 (SUTURE) ×1 IMPLANT
TOWEL OR 17X26 10 PK STRL BLUE (TOWEL DISPOSABLE) ×4 IMPLANT

## 2012-11-07 NOTE — Anesthesia Postprocedure Evaluation (Signed)
  Anesthesia Post-op Note  Patient: Christian Thomas  Procedure(s) Performed: Procedure(s) (LRB): ARTHROPLASTY BIPOLAR HIP (Right)  Patient Location: PACU  Anesthesia Type: General  Level of Consciousness: awake and alert   Airway and Oxygen Therapy: Patient Spontanous Breathing  Post-op Pain: mild  Post-op Assessment: Post-op Vital signs reviewed, Patient's Cardiovascular Status Stable, Respiratory Function Stable, Patent Airway and No signs of Nausea or vomiting  Last Vitals:  Filed Vitals:   11/07/12 1600  BP: 153/57  Pulse: 59  Temp: 36.8 C  Resp: 14    Post-op Vital Signs: stable   Complications: No apparent anesthesia complications

## 2012-11-07 NOTE — Anesthesia Preprocedure Evaluation (Addendum)
Anesthesia Evaluation  Patient identified by MRN, date of birth, ID band Patient awake    Reviewed: Allergy & Precautions, H&P , NPO status , Patient's Chart, lab work & pertinent test results  Airway Mallampati: II TM Distance: >3 FB Neck ROM: Full    Dental No notable dental hx.    Pulmonary neg pulmonary ROS,  CXR: no acute disease breath sounds clear to auscultation  Pulmonary exam normal       Cardiovascular hypertension, Pt. on medications + Peripheral Vascular Disease negative cardio ROS  + dysrhythmias Rhythm:Regular Rate:Normal  ECG: reviewed. Trifascicular block.   Neuro/Psych negative neurological ROS  negative psych ROS   GI/Hepatic negative GI ROS, Neg liver ROS,   Endo/Other  diabetes, Type 2, Oral Hypoglycemic Agents  Renal/GU negative Renal ROS  negative genitourinary   Musculoskeletal negative musculoskeletal ROS (+)   Abdominal   Peds negative pediatric ROS (+)  Hematology negative hematology ROS (+)   Anesthesia Other Findings   Reproductive/Obstetrics negative OB ROS                         Anesthesia Physical Anesthesia Plan  ASA: III  Anesthesia Plan: General   Post-op Pain Management:    Induction: Intravenous  Airway Management Planned: Oral ETT  Additional Equipment:   Intra-op Plan:   Post-operative Plan: Extubation in OR  Informed Consent: I have reviewed the patients History and Physical, chart, labs and discussed the procedure including the risks, benefits and alternatives for the proposed anesthesia with the patient or authorized representative who has indicated his/her understanding and acceptance.   Dental advisory given  Plan Discussed with: CRNA  Anesthesia Plan Comments: (Plavix 11/05/12)        Anesthesia Quick Evaluation

## 2012-11-07 NOTE — Preoperative (Signed)
Beta Blockers   Reason not to administer Beta Blockers:Not Applicable 

## 2012-11-07 NOTE — Progress Notes (Signed)
Patient ID: Christian Thomas, male   DOB: Jan 26, 1915, 78 y.o.   MRN: [id number] Subjective:   Right femoral neck fracture pending OR today  Patient reports pain as moderate with movement  Objective:   VITALS:   Filed Vitals:   11/07/12 0943  BP: 181/75  Pulse:   Temp:   Resp:     Exam: deferred due to pain and radiographic findings  LABS  Basename 11/07/12 0455 11/05/12 2325  HGB 12.1* 11.9*  HCT 36.2* 35.3*  WBC 7.6 10.0  PLT 173 168     Basename 11/07/12 0455 11/05/12 2325  NA 139 136  K 3.9 4.5  BUN 15 27*  CREATININE 0.86 1.10  GLUCOSE 94 220*     Basename 11/05/12 2325  LABPT --  INR 0.99     Assessment/Plan:   Right femoral neck fracture   {Plan: To OR today for right hemiarthroplasty NPO Consent on chart

## 2012-11-07 NOTE — Progress Notes (Signed)
Patient ID: Christian Thomas, male   DOB: Jul 31, 1915, 78 y.o.   MRN: [id number]  [facility] PROGRESS NOTE  Christian Thomas:[id number] DOB: Dec 10, 1914 DOA: 11/05/2012 PCP: Harlow Asa, MD  Brief narrative: 78 year old male with a history of diabetes type 2, hyperlipidemia, hypertension, and constipation presented to [facility] ED after a fall. Workup revealed a right femoral neck fracture. The patient was transferred to [facility] for surgical intervention. The patient denied any syncope, but complained of some dizziness, explained that it is mostly chronic in nature. The patient has had previous workup for syncope in the past including a Holter monitor in 2012 which was negative for any dysrhythmia. However the patient adamantly denied any syncope during this episode. Interestingly, his nursing facility, [facility], recently placed him on Xanax on Saturday. After breakfast the day of the admission, the patient felt somewhat dizzy and upon walking back to his room, the patient fell onto his right side.   Assessment/Plan:  Principal Problem:  *Femoral neck fracture - in the setting of what appears to be likely a mechanical fall - ortho following and plan for ORIF today - will continue supportive care and monitor on telemetry - will provide analgesia as needed for symptom control Active Problems:  DM2 (diabetes mellitus, type 2) - continue Insulin for now and readjust the regimen as indicated  -  check A1C 6.4  Essential hypertension, benign - accelerated - will continue Lisinopril as per home medication regimen but will increase the dose to 10 mg PO QD - will readjust the regimen as needed.  Constipation - start bowel regimen, scheduled and as needed   Consultants:  Ortho  Procedures/Studies: Dg Chest 2 View 11/05/2012  --> Cardiomegaly.  No active lung disease.   Dg Hip Complete Right 11/05/2012  --> Acute right femoral neck fracture with varus deformity.   Dg  Knee 2 Views Right 11/05/2012 --> No acute abnormality.  Tricompartmental degenerative joint disease.   Dg Chest Portable 1 View 11/05/2012 --> No acute finding.  Dg Foot Complete Right 11/05/2012  --> No fracture. Erosion involving the distal right first metatarsal.  Question gout.    Antibiotics:  None  Code Status: DNR Family Communication: Pt and son at bedside Disposition Plan: Plan for ORIF today   HPI/Subjective: No events overnight.   Objective: Filed Vitals:   11/07/12 0400 11/07/12 0514 11/07/12 0518 11/07/12 0643  BP:  197/75 196/86 190/86  Pulse:  73    Temp:  98 F (36.7 C)    TempSrc:  Oral    Resp: 18 18    Height:      Weight:      SpO2: 98% 98%      Intake/Output Summary (Last 24 hours) at 11/07/12 0756 Last data filed at 11/07/12 0719  Gross per 24 hour  Intake    790 ml  Output   1600 ml  Net   -810 ml    Exam:   General:  Pt is alert but confused this AM, follows commands appropriately, not in acute distress  Cardiovascular: Regular rate and rhythm, S1/S2, no murmurs, no rubs, no gallops  Respiratory: Clear to auscultation bilaterally, no wheezing, no crackles, no rhonchi  Abdomen: Soft, non tender, non distended, bowel sounds present, no guarding  Extremities: No edema, pulses DP and PT palpable bilaterally  Neuro: Grossly nonfocal  Data Reviewed: Basic Metabolic Panel:  Lab 11/07/12 9562 11/05/12 2325  NA 139 136  K 3.9 4.5  CL  109 102  CO2 22 25  GLUCOSE 94 220*  BUN 15 27*  CREATININE 0.86 1.10  CALCIUM 8.5 8.6  MG -- --  PHOS -- --   Liver Function Tests:  Lab 11/05/12 2325  AST 15  ALT 15  ALKPHOS 57  BILITOT 0.3  PROT 5.7*  ALBUMIN 3.0*   CBC:  Lab 11/07/12 0455 11/05/12 2325  WBC 7.6 10.0  NEUTROABS -- 7.3  HGB 12.1* 11.9*  HCT 36.2* 35.3*  MCV 96.5 96.7  PLT 173 168   CBG:  Lab 11/07/12 0742 11/06/12 2057 11/06/12 1654 11/06/12 1151 11/06/12 0801  GLUCAP 116* 194* 105* 99 103*    Scheduled Meds:    . allopurinol  300 mg Oral Daily  . aspirin  81 mg Oral Daily  . enalapril  5 mg Oral Daily  . enoxaparin  injection  40 mg Subcutaneous Q24H  . insulin aspart  0-5 Units Subcutaneous QHS  . insulin aspart  0-9 Units Subcutaneous TID WC  . pantoprazole  40 mg Oral Daily  . polyethylene glycol  17 g Oral TID  . senna  2 tablet Oral QHS  . simvastatin  20 mg Oral q1800  . terazosin  5 mg Oral Daily   Continuous Infusions:    Dhairya Corales, MD  [facility] Pager [phone]  If 7PM-7AM, please contact night-coverage [email or web address] Password [email or web address] 11/07/2012, 7:56 AM   LOS: 2 days

## 2012-11-07 NOTE — Brief Op Note (Signed)
11/05/2012 - 11/07/2012  3:10 PM  PATIENT:  Christian Thomas  78 y.o. male  PRE-OPERATIVE DIAGNOSIS:  Displaced right femoral neck, fracture hip  POST-OPERATIVE DIAGNOSIS: Displaced right femoral neck, fracture hip  PROCEDURE:  Procedure(s) (LRB) with comments: ARTHROPLASTY BIPOLAR HIP (Right)  SURGEON:  Surgeon(s) and Role:    * Shelda Pal, MD - Primary  PHYSICIAN ASSISTANT: Lanney Gins, PA-C  ANESTHESIA:   general  EBL:  Total I/O In: 40 [Other:40] Out: 1100 [Urine:1000; Blood:100]  BLOOD ADMINISTERED:none  DRAINS: (1 medium) Hemovact drain(s) in the right hip with  Suction Open   LOCAL MEDICATIONS USED:  NONE  SPECIMEN:  No Specimen  DISPOSITION OF SPECIMEN:  N/A  COUNTS:  YES  TOURNIQUET:  * No tourniquets in log *  DICTATION: .Other Dictation: Dictation Number [id number]  PLAN OF CARE: Admit to inpatient   PATIENT DISPOSITION:  PACU - hemodynamically stable.   Delay start of Pharmacological VTE agent (>24hrs) due to surgical blood loss or risk of bleeding: no

## 2012-11-07 NOTE — Transfer of Care (Signed)
Immediate Anesthesia Transfer of Care Note  Patient: Christian Thomas  Procedure(s) Performed: Procedure(s) (LRB) with comments: ARTHROPLASTY BIPOLAR HIP (Right)  Patient Location: PACU  Anesthesia Type:General  Level of Consciousness: awake, alert , oriented and patient cooperative  Airway & Oxygen Therapy: Patient Spontanous Breathing and Patient connected to face mask oxygen  Post-op Assessment: Report given to PACU RN, Post -op Vital signs reviewed and stable and Patient moving all extremities  Post vital signs: Reviewed and stable  Complications: No apparent anesthesia complications

## 2012-11-08 LAB — CBC
HCT: 31.1 % — ABNORMAL LOW (ref 39.0–52.0)
Platelets: 171 10*3/uL (ref 150–400)
RBC: 3.21 MIL/uL — ABNORMAL LOW (ref 4.22–5.81)
RDW: 13.8 % (ref 11.5–15.5)
WBC: 7.7 10*3/uL (ref 4.0–10.5)

## 2012-11-08 LAB — GLUCOSE, CAPILLARY: Glucose-Capillary: 170 mg/dL — ABNORMAL HIGH (ref 70–99)

## 2012-11-08 LAB — BASIC METABOLIC PANEL
CO2: 23 mEq/L (ref 19–32)
Chloride: 103 mEq/L (ref 96–112)
GFR calc Af Amer: 64 mL/min — ABNORMAL LOW (ref >90)
Potassium: 4.2 mEq/L (ref 3.5–5.1)

## 2012-11-08 NOTE — Op Note (Signed)
NAMENISHANT, SCHRECENGOST NO.:  [id number]  MEDICAL RECORD NO.:  [id number]  LOCATION:  1425                         FACILITY:  [facility]  PHYSICIAN:  Madlyn Frankel. Charlann Boxer, M.D.  DATE OF BIRTH:  1915-01-10  DATE OF PROCEDURE:  11/07/2012 DATE OF DISCHARGE:                              OPERATIVE REPORT   PREOPERATIVE DIAGNOSIS:  Displaced right femoral neck fracture, mid cervical region.  POSTOPERATIVE DIAGNOSIS:  Displaced right femoral neck fracture, mid cervical region.  PROCEDURE:  Right hip hemiarthroplasty utilizing a size 6 standard press- fit Summit basic stem with a 54 unipolar ball and 0 adapter.  SURGEON:  Madlyn Frankel. Charlann Boxer, MD  ASSISTANT:  Lanney Gins, PA-C.  NOTE:  Dr. Carmon Sails was present for the entire case and utilized for preoperative position, perioperative management of the operative extremity, general facilitation of the case, and primary wound closure.  ANESTHESIA:  General.  SPECIMENS:  None.  DRAINS:  One medium Hemovac.  COMPLICATIONS:  None.  BLOOD LOSS:  About 150 mL.  INDICATIONS FOR PROCEDURE:  Ms. Kadrmas is a 78 year old gentleman who otherwise lives independently, had unfortunate ground level fall.  He was brought to the emergency room.  Radiographs confirmed concerns for femoral neck fracture.  The patient was initially seen and evaluated at an outside facility and transferred down for definitive orthopedic care. Risks and benefits were reviewed with the patient and family, particularly his daughter power of attorney.  Consent was obtained for benefit of fracture management, risks of infection, DVT, dislocation were discussed and reviewed as possibilities, but of little clinical concern.  PROCEDURE IN DETAIL:  The patient was brought to the operative theater. Once adequate anesthesia, preoperative antibiotics, Ancef administered, patient was positioned in the left lateral decubitus position, the right side up.  Right lower  extremity was then prepped and draped in a sterile fashion.  Time-out was performed identifying the patient, planned procedure, and extremity.  A lateral incision was made over the proximal trochanter sharp dissection down to the iliotibial band and gluteal fascia which were then incised for posterior approach to the hip.  The short external rotators and posterior capsule taken down as a single L capsulotomy exposing the hip joint.  The hip was internally rotated and flexed and a neck osteotomy made based on the mid cervical nature of the fracture fragment.  The ring of cervical bone was removed and the femoral head was then removed and measured on the back table to be 54 mm in diameter.  At this point, the proximal femur was exposed with retractors.  The proximal femur was then opened with a starting drill, hand reamed once and then irrigated to try to prevent fat emboli.  I began broach with a size 1 broach setting anteversion at about 20-25 degrees for this posterior approach to the hip.  I broached all the way up to a size 6 with good metaphyseal fit in the medially and laterally.  I did a trial reduction at this point, with the 6 broach in place, a standard neck and a 54 unipolar ball with a 0 adapter.  At this point, the hip was reduced and we checked range of motion,  found the hip to be very stable.  At this point, the trial components were removed.  The final 6 standard press-fit Summit basic stem was selected.  After irrigating the canal, the final stem was impacted to the level of the prepared neck cut.  Based on the trial reduction, based on the location of the neck cut, I selected the 0 adapter which was impacted into the 54 unipolar ball and the 2 were then impacted on a clean and dry trunnion and the hip was reduced.  We irrigated the hip again at this point, placed a medium Hemovac drain deep.  I reapproximated the posterior capsule and short external rotators to the  superior leaflet.  The iliotibial band and gluteal fascia were then reapproximated using #1 Vicryl in a 0 V-Loc.  The remaining wound was closed with 2-0 Vicryl and running 4-0 Monocryl.  The hip was cleaned, dried, and dressed sterilely using a Dermabond and Aquacel dressing.  Drain site dressed separately. The patient was then brought to recovery room, extubated in stable condition.  I would have him partial weightbearing for 4-6 weeks to allow for bony on growth onto the stem, results reviewed with family.     Madlyn Frankel Charlann Boxer, M.D.     MDO/MEDQ  D:  11/07/2012  T:  11/08/2012  Job:  [id number]

## 2012-11-08 NOTE — Progress Notes (Signed)
Physical Therapy Treatment Patient Details Name: Christian Thomas MRN: [id number] DOB: 1915-10-03 Today's Date: 11/08/2012 Time: 7829-5621 PT Time Calculation (min): 31 min  PT Assessment / Plan / Recommendation Comments on Treatment Session  Increased time all activities    Follow Up Recommendations  SNF     Does the patient have the potential to tolerate intense rehabilitation     Barriers to Discharge        Equipment Recommendations  None recommended by PT    Recommendations for Other Services OT consult  Frequency Min 3X/week   Plan Discharge plan remains appropriate    Precautions / Restrictions Precautions Precautions: Posterior Hip Precaution Comments: pt requires cues for recall of all THP and PWB Restrictions Weight Bearing Restrictions: Yes RLE Weight Bearing: Partial weight bearing RLE Partial Weight Bearing Percentage or Pounds: 50   Pertinent Vitals/Pain     Mobility  Bed Mobility Bed Mobility: Supine to Sit;Sit to Supine Supine to Sit: 3: Mod assist Sit to Supine: 1: +2 Total assist Sit to Supine: Patient Percentage: 40% Details for Bed Mobility Assistance: cues for sequence, THP and use of L LE to self assist Transfers Transfers: Sit to Stand;Stand to Sit Sit to Stand: 1: +2 Total assist;From bed;From chair/3-in-1;With armrests;With upper extremity assist Sit to Stand: Patient Percentage: 50% Stand to Sit: 1: +2 Total assist;To bed;To chair/3-in-1;With armrests;With upper extremity assist Stand to Sit: Patient Percentage: 40% Stand Pivot Transfers: 1: +2 Total assist Stand Pivot Transfers: Patient Percentage: 50% Details for Transfer Assistance: Stand pvt with RW bed<>BSC with +2 assist and constant cues for sequence, posture, position from RW and THP Ambulation/Gait Ambulation/Gait Assistance:  (Stand pvt only)    Exercises Total Joint Exercises Ankle Circles/Pumps: 15 reps;Supine;Both Quad Sets: AROM;10 reps;Both;Supine Heel Slides: AAROM;15  reps;Supine;Right Hip ABduction/ADduction: AAROM;10 reps;Right;Supine   PT Diagnosis: Difficulty walking  PT Problem List: Decreased strength;Decreased range of motion;Decreased activity tolerance;Decreased balance;Decreased mobility;Decreased knowledge of use of DME;Decreased knowledge of precautions;Pain PT Treatment Interventions: DME instruction;Gait training;Stair training;Functional mobility training;Therapeutic activities;Therapeutic exercise;Patient/family education   PT Goals Acute Rehab PT Goals PT Goal Formulation: With patient Time For Goal Achievement: 11/14/12 Potential to Achieve Goals: Fair Pt will go Supine/Side to Sit: with min assist PT Goal: Supine/Side to Sit - Progress: Goal set today Pt will go Sit to Supine/Side: with mod assist PT Goal: Sit to Supine/Side - Progress: Goal set today Pt will go Sit to Stand: with mod assist PT Goal: Sit to Stand - Progress: Goal set today Pt will go Stand to Sit: with mod assist PT Goal: Stand to Sit - Progress: Goal set today Pt will Ambulate: 16 - 50 feet;with +2 total assist PT Goal: Ambulate - Progress: Goal set today  Visit Information  Last PT Received On: 11/08/12 Assistance Needed: +2    Subjective Data  Subjective: I have to use the bathroom Patient Stated Goal: Back to previous living arrangement if possible   Cognition  Overall Cognitive Status: Appears within functional limits for tasks assessed/performed Arousal/Alertness: Awake/alert Orientation Level: Appears intact for tasks assessed Behavior During Session: [facility] for tasks performed    Balance     End of Session PT - End of Session Equipment Utilized During Treatment: Gait belt Activity Tolerance: Patient tolerated treatment well Patient left: in bed;with call bell/phone within reach Nurse Communication: Mobility status   GP     Christian Thomas 11/08/2012, 4:43 PM

## 2012-11-08 NOTE — Progress Notes (Signed)
Patient ID: Christian Thomas, male   DOB: May 23, 1915, 78 y.o.   MRN: [id number]  [facility] PROGRESS NOTE  Christian Thomas DOB: Nov 09, 1915 DOA: 11/05/2012 PCP: Harlow Asa, MD  Brief narrative: 78 year old male with a history of diabetes type 2, hyperlipidemia, hypertension, and constipation presented to [facility] ED after a fall. Workup revealed a right femoral neck fracture. The patient was transferred to [facility] for surgical intervention. The patient denied any syncope, but complained of some dizziness, explained that it is mostly chronic in nature. The patient has had previous workup for syncope in the past including a Holter monitor in 2012 which was negative for any dysrhythmia. However the patient adamantly denied any syncope during this episode. Interestingly, his nursing facility, [facility], recently placed him on Xanax on Saturday. After breakfast the day of the admission, the patient felt somewhat dizzy and upon walking back to his room, the patient fell onto his right side.   Assessment/Plan:  Principal Problem:  *Femoral neck fracture - in the setting of what appears to be likely a mechanical fall - ortho following and s/p ORIF on 12/24.  - will continue supportive care and monitor on telemetry - will provide analgesia as needed for symptom control. - weight bearing as tolerated.  Active Problems:  DM2 (diabetes mellitus, type 2) - continue Insulin for now and readjust the regimen as indicated  -  check A1C 6.4 CBG (last 3)   Basename 11/08/12 0743 11/07/12 2107 11/07/12 1649  GLUCAP 152* 178* 131*      Essential hypertension, benign - accelerated - will continue Lisinopril as per home medication regimen but will increase the dose to 10 mg PO QD - will readjust the regimen as needed.  Constipation - start bowel regimen, scheduled and as needed   Consultants:  Ortho  Procedures/Studies: Dg Chest 2 View 11/05/2012  -->  Cardiomegaly.  No active lung disease.   Dg Hip Complete Right 11/05/2012  --> Acute right femoral neck fracture with varus deformity.   Dg Knee 2 Views Right 11/05/2012 --> No acute abnormality.  Tricompartmental degenerative joint disease.   Dg Chest Portable 1 View 11/05/2012 --> No acute finding.  Dg Foot Complete Right 11/05/2012  --> No fracture. Erosion involving the distal right first metatarsal.  Question gout.    Antibiotics:  None  Code Status: DNR Family Communication: Pt and son at bedside Disposition Plan: Plan for ORIF today   HPI/Subjective: No events overnight.   Objective: Filed Vitals:   11/08/12 0000 11/08/12 0400 11/08/12 0540 11/08/12 0800  BP:   126/35   Pulse:   75   Temp:   97.7 F (36.5 C)   TempSrc:   Oral   Resp: 20 18 18 18   Height:      Weight:      SpO2: 95% 97% 98%     Intake/Output Summary (Last 24 hours) at 11/08/12 0910 Last data filed at 11/08/12 0600  Gross per 24 hour  Intake   3475 ml  Output   1898 ml  Net   1577 ml    Exam:   General:  Pt is alert but confused this AM, follows commands appropriately, not in acute distress  Cardiovascular: Regular rate and rhythm, S1/S2, no murmurs, no rubs, no gallops  Respiratory: Clear to auscultation bilaterally, no wheezing, no crackles, no rhonchi  Abdomen: Soft, non tender, non distended, bowel sounds present, no guarding  Extremities: No edema, pulses DP and PT palpable  bilaterally  Neuro: Grossly nonfocal  Data Reviewed: Basic Metabolic Panel:  Lab 11/08/12 4098 11/07/12 0455 11/05/12 2325  NA 135 139 136  K 4.2 3.9 4.5  CL 103 109 102  CO2 23 22 25   GLUCOSE 188* 94 220*  BUN 20 15 27*  CREATININE 1.09 0.86 1.10  CALCIUM 8.1* 8.5 8.6  MG -- -- --  PHOS -- -- --   Liver Function Tests:  Lab 11/05/12 2325  AST 15  ALT 15  ALKPHOS 57  BILITOT 0.3  PROT 5.7*  ALBUMIN 3.0*   CBC:  Lab 11/08/12 0456 11/07/12 0455 11/05/12 2325  WBC 7.7 7.6 10.0  NEUTROABS --  -- 7.3  HGB 10.6* 12.1* 11.9*  HCT 31.1* 36.2* 35.3*  MCV 96.9 96.5 96.7  PLT 171 173 168   CBG:  Lab 11/08/12 0743 11/07/12 2107 11/07/12 1649 11/07/12 1538 11/07/12 1137  GLUCAP 152* 178* 131* 116* 109*    Scheduled Meds:   . allopurinol  300 mg Oral Daily  . aspirin  81 mg Oral Daily  . enalapril  5 mg Oral Daily  . enoxaparin  injection  40 mg Subcutaneous Q24H  . insulin aspart  0-5 Units Subcutaneous QHS  . insulin aspart  0-9 Units Subcutaneous TID WC  . pantoprazole  40 mg Oral Daily  . polyethylene glycol  17 g Oral TID  . senna  2 tablet Oral QHS  . simvastatin  20 mg Oral q1800  . terazosin  5 mg Oral Daily   Continuous Infusions:    Christian Sensabaugh, MD  [facility] Pager [phone]  If 7PM-7AM, please contact night-coverage [email or web address] Password [email or web address] 11/08/2012, 9:10 AM   LOS: 3 days

## 2012-11-08 NOTE — Progress Notes (Signed)
   Subjective: 1 Day Post-Op Procedure(s) (LRB): ARTHROPLASTY BIPOLAR HIP (Right)   Patient reports pain as mild, states that the pain is well controlled. No events throughout the night.   Objective:   VITALS:   Filed Vitals:   11/08/12  BP: 126/35  Pulse: 75  Temp: 97.7 F (36.5 C)   Resp: 18    Dorsiflexion/Plantar flexion intact Incision: dressing C/D/I No cellulitis present Compartment soft  LABS  Basename 11/08/12 0456 11/07/12 0455 11/05/12 2325  HGB 10.6* 12.1* 11.9*  HCT 31.1* 36.2* 35.3*  WBC 7.7 7.6 10.0  PLT 171 173 168     Basename 11/08/12 0456 11/07/12 0455 11/05/12 2325  NA 135 139 136  K 4.2 3.9 4.5  BUN 20 15 27*  CREATININE 1.09 0.86 1.10  GLUCOSE 188* 94 220*     Assessment/Plan: 1 Day Post-Op Procedure(s) (LRB): ARTHROPLASTY BIPOLAR HIP (Right)  HV drain d/c'ed 50 % Weight bearing on the right leg Up with therapy Discharge eventually when medically ready   Anastasio Auerbach. Alric Geise   PAC  11/08/2012, 11:03 AM

## 2012-11-08 NOTE — Evaluation (Signed)
Physical Therapy Evaluation Patient Details Name: Christian Thomas MRN: [id number] DOB: [birth year] Today's Date: 11/08/2012 Time: 2952-8413 PT Time Calculation (min): 33 min  PT Assessment / Plan / Recommendation Clinical Impression  Pt s/p R hip fx with hemiarthroplasty repair presents with decreased R LE stength/ROM, THP (post) , PWB on R LE and post op pain limiting funtional mobility    PT Assessment  Patient needs continued PT services    Follow Up Recommendations  SNF    Does the patient have the potential to tolerate intense rehabilitation      Barriers to Discharge        Equipment Recommendations  None recommended by PT    Recommendations for Other Services OT consult   Frequency Min 3X/week    Precautions / Restrictions Precautions Precautions: Posterior Hip Precaution Comments: sign hung in room Restrictions Weight Bearing Restrictions: Yes RLE Weight Bearing: Partial weight bearing RLE Partial Weight Bearing Percentage or Pounds: 50   Pertinent Vitals/Pain It doesn't hurt much      Mobility  Bed Mobility Bed Mobility: Supine to Sit;Sit to Supine Supine to Sit: 3: Mod assist Sit to Supine: 1: +2 Total assist Sit to Supine: Patient Percentage: 40% Details for Bed Mobility Assistance: cues for sequence, THP and use of L LE to self assist Transfers Transfers: Sit to Stand;Stand to Sit Sit to Stand: 1: +2 Total assist;From bed;From chair/3-in-1;With armrests;With upper extremity assist Sit to Stand: Patient Percentage: 50% Stand to Sit: 1: +2 Total assist;To bed;To chair/3-in-1;With armrests;With upper extremity assist Stand to Sit: Patient Percentage: 40% Details for Transfer Assistance: cues for LE management and use of UEs to self assist with bed mobility.      Shoulder Instructions     Exercises Total Joint Exercises Ankle Circles/Pumps: 15 reps;Supine;Both Quad Sets: AROM;10 reps;Both;Supine Heel Slides: AAROM;15 reps;Supine;Right Hip  ABduction/ADduction: AAROM;10 reps;Right;Supine   PT Diagnosis: Difficulty walking  PT Problem List: Decreased strength;Decreased range of motion;Decreased activity tolerance;Decreased balance;Decreased mobility;Decreased knowledge of use of DME;Decreased knowledge of precautions;Pain PT Treatment Interventions: DME instruction;Gait training;Stair training;Functional mobility training;Therapeutic activities;Therapeutic exercise;Patient/family education   PT Goals Acute Rehab PT Goals PT Goal Formulation: With patient Time For Goal Achievement: 11/14/12 Potential to Achieve Goals: Fair Pt will go Supine/Side to Sit: with min assist PT Goal: Supine/Side to Sit - Progress: Goal set today Pt will go Sit to Supine/Side: with mod assist PT Goal: Sit to Supine/Side - Progress: Goal set today Pt will go Sit to Stand: with mod assist PT Goal: Sit to Stand - Progress: Goal set today Pt will go Stand to Sit: with mod assist PT Goal: Stand to Sit - Progress: Goal set today Pt will Ambulate: 16 - 50 feet;with +2 total assist PT Goal: Ambulate - Progress: Goal set today  Visit Information  Last PT Received On: 11/08/12 Assistance Needed: +2    Subjective Data  Subjective: I've been in bed since Sunday Patient Stated Goal: Back to previous living arrangement if possible   Prior Functioning  Home Living Lives With: Other (Comment) (Independent living facility) Available Help at Discharge: Skilled Nursing Facility Home Adaptive Equipment: Walker - rolling Prior Function Level of Independence: Independent with assistive device(s) Vocation: Retired Musician: H[employer]    Cognition  Overall Cognitive Status: Appears within functional limits for tasks assessed/performed Arousal/Alertness: Awake/alert Orientation Level: Appears intact for tasks assessed Behavior During Session: [facility] for tasks performed    Extremity/Trunk Assessment Right Upper Extremity Assessment RUE  ROM/Strength/Tone: [facility] for tasks assessed Left  Upper Extremity Assessment LUE ROM/Strength/Tone: WFL for tasks assessed Right Lower Extremity Assessment RLE ROM/Strength/Tone: Deficits RLE ROM/Strength/Tone Deficits: Hip strength 2-/5 with AAROM at hip to 90 flex and 20 abd Left Lower Extremity Assessment LLE ROM/Strength/Tone: WFL for tasks assessed   Balance    End of Session PT - End of Session Activity Tolerance: Patient tolerated treatment well Patient left: in bed;with call bell/phone within reach Nurse Communication: Mobility status  GP     Ladarian Bonczek 11/08/2012, 4:36 PM

## 2012-11-09 ENCOUNTER — Encounter (HOSPITAL_COMMUNITY): Payer: Self-pay | Admitting: Orthopedic Surgery

## 2012-11-09 DIAGNOSIS — S7290XA Unspecified fracture of unspecified femur, initial encounter for closed fracture: Secondary | ICD-10-CM | POA: Diagnosis not present

## 2012-11-09 DIAGNOSIS — I10 Essential (primary) hypertension: Secondary | ICD-10-CM | POA: Diagnosis not present

## 2012-11-09 DIAGNOSIS — Z8739 Personal history of other diseases of the musculoskeletal system and connective tissue: Secondary | ICD-10-CM | POA: Diagnosis not present

## 2012-11-09 DIAGNOSIS — K219 Gastro-esophageal reflux disease without esophagitis: Secondary | ICD-10-CM | POA: Diagnosis not present

## 2012-11-09 DIAGNOSIS — M6281 Muscle weakness (generalized): Secondary | ICD-10-CM | POA: Diagnosis not present

## 2012-11-09 DIAGNOSIS — R262 Difficulty in walking, not elsewhere classified: Secondary | ICD-10-CM | POA: Diagnosis not present

## 2012-11-09 DIAGNOSIS — K5900 Constipation, unspecified: Secondary | ICD-10-CM | POA: Diagnosis not present

## 2012-11-09 DIAGNOSIS — G909 Disorder of the autonomic nervous system, unspecified: Secondary | ICD-10-CM | POA: Diagnosis not present

## 2012-11-09 DIAGNOSIS — E119 Type 2 diabetes mellitus without complications: Secondary | ICD-10-CM | POA: Diagnosis not present

## 2012-11-09 DIAGNOSIS — I951 Orthostatic hypotension: Secondary | ICD-10-CM | POA: Diagnosis not present

## 2012-11-09 DIAGNOSIS — S72009A Fracture of unspecified part of neck of unspecified femur, initial encounter for closed fracture: Secondary | ICD-10-CM | POA: Diagnosis not present

## 2012-11-09 DIAGNOSIS — N400 Enlarged prostate without lower urinary tract symptoms: Secondary | ICD-10-CM | POA: Diagnosis not present

## 2012-11-09 DIAGNOSIS — M79609 Pain in unspecified limb: Secondary | ICD-10-CM | POA: Diagnosis not present

## 2012-11-09 DIAGNOSIS — F411 Generalized anxiety disorder: Secondary | ICD-10-CM | POA: Diagnosis not present

## 2012-11-09 DIAGNOSIS — I498 Other specified cardiac arrhythmias: Secondary | ICD-10-CM | POA: Diagnosis not present

## 2012-11-09 DIAGNOSIS — E46 Unspecified protein-calorie malnutrition: Secondary | ICD-10-CM | POA: Diagnosis not present

## 2012-11-09 LAB — BASIC METABOLIC PANEL
BUN: 26 mg/dL — ABNORMAL HIGH (ref 6–23)
CO2: 24 mEq/L (ref 19–32)
Calcium: 8.3 mg/dL — ABNORMAL LOW (ref 8.4–10.5)
GFR calc non Af Amer: 48 mL/min — ABNORMAL LOW (ref >90)
Glucose, Bld: 137 mg/dL — ABNORMAL HIGH (ref 70–99)
Potassium: 4.1 mEq/L (ref 3.5–5.1)
Sodium: 136 mEq/L (ref 135–145)

## 2012-11-09 LAB — CBC
HCT: 28.6 % — ABNORMAL LOW (ref 39.0–52.0)
Hemoglobin: 9.5 g/dL — ABNORMAL LOW (ref 13.0–17.0)
MCH: 32.3 pg (ref 26.0–34.0)
MCHC: 33.2 g/dL (ref 30.0–36.0)
RBC: 2.94 MIL/uL — ABNORMAL LOW (ref 4.22–5.81)

## 2012-11-09 LAB — GLUCOSE, CAPILLARY: Glucose-Capillary: 113 mg/dL — ABNORMAL HIGH (ref 70–99)

## 2012-11-09 MED ORDER — ALPRAZOLAM 0.25 MG PO TABS: 0.2500 mg | ORAL_TABLET | Freq: Two times a day (BID) | ORAL | Status: DC

## 2012-11-09 MED ORDER — INSULIN ASPART 100 UNIT/ML ~~LOC~~ SOLN: 0.0000 [IU] | Freq: Three times a day (TID) | SUBCUTANEOUS | Status: DC

## 2012-11-09 MED ORDER — FERROUS SULFATE 325 (65 FE) MG PO TABS: 325.0000 mg | ORAL_TABLET | Freq: Three times a day (TID) | ORAL | Status: DC

## 2012-11-09 MED ORDER — ENALAPRIL MALEATE 5 MG PO TABS: 5.0000 mg | ORAL_TABLET | Freq: Every day | ORAL | Status: DC

## 2012-11-09 MED ORDER — SENNA 8.6 MG PO TABS: 2.0000 | ORAL_TABLET | Freq: Every day | ORAL | Status: DC

## 2012-11-09 MED ORDER — HYDROCODONE-ACETAMINOPHEN 5-325 MG PO TABS: 1.0000 | ORAL_TABLET | Freq: Four times a day (QID) | ORAL | Status: DC | PRN

## 2012-11-09 MED ORDER — ASPIRIN 81 MG PO CHEW: 81.0000 mg | CHEWABLE_TABLET | Freq: Every day | ORAL | Status: DC

## 2012-11-09 NOTE — Progress Notes (Signed)
Patient is agreeable with plan for SNF, expressed interest in [facility]. CSW confirmed with Debbie @ SNF that they would be able to take him when stable for discharge. Anticipating discharge today.   Clinical Social Work Department CLINICAL SOCIAL WORK PLACEMENT NOTE 11/09/2012  Patient:  Christian Thomas, Christian Thomas  Account Number:  [id number] Admit date:  11/05/2012  Clinical Social Worker:  Orpah Greek  Date/time:  11/09/2012 11:01 AM  Clinical Social Work is seeking post-discharge placement for this patient at the following level of care:   SKILLED NURSING   (*CSW will update this form in Epic as items are completed)   11/09/2012  Patient/family provided with [facility] Department of Clinical Social Work's list of facilities offering this level of care within the geographic area requested by the patient (or if unable, by the patient's family).  11/09/2012  Patient/family informed of their freedom to choose among providers that offer the needed level of care, that participate in Medicare, Medicaid or managed care program needed by the patient, have an available bed and are willing to accept the patient.  11/09/2012  Patient/family informed of [facility]' ownership interest in [facility], as well as of the fact that they are under no obligation to receive care at this facility.  PASARR submitted to EDS on 11/09/2012 PASARR number received from EDS on 11/09/2012  FL2 transmitted to all facilities in geographic area requested by pt/family on  11/09/2012 FL2 transmitted to all facilities within larger geographic area on   Patient informed that his/her managed care company has contracts with or will negotiate with  certain facilities, including the following:     Patient/family informed of bed offers received:  11/09/2012 Patient chooses bed at [facility] [place] Physician recommends and patient chooses bed at    Patient to be transferred to [facility] [place]  on   Patient to be transferred to facility by   The following physician request were entered in Epic:   Additional Comments:  Unice Bailey, LCSW [facility] Clinical Social Worker cell #: [phone]

## 2012-11-09 NOTE — Progress Notes (Signed)
   Subjective: 2 Days Post-Op Procedure(s) (LRB): ARTHROPLASTY BIPOLAR HIP (Right)   Patient reports pain as mild, minimal pain in the left hip. 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 Patient and family understand that the plan per medicine is to discharge to SNF today.  They are all agreeable to this plan.  Objective:   VITALS:   Filed Vitals:   11/09/12  BP: 146/52  Pulse: 68  Temp: 98 F (36.7 C)   Resp: 16    Dorsiflexion/Plantar flexion intact  Incision: dressing C/D/I  No cellulitis present  Compartment soft   LABS  Basename 11/09/12 0410 11/08/12 0456 11/07/12 0455  HGB 9.5* 10.6* 12.1*  HCT 28.6* 31.1* 36.2*  WBC 6.9 7.7 7.6  PLT 168 171 173     Basename 11/09/12 0410 11/08/12 0456 11/07/12 0455  NA 136 135 139  K 4.1 4.2 3.9  BUN 26* 20 15  CREATININE 1.24 1.09 0.86  GLUCOSE 137* 188* 94     Assessment/Plan: 2 Days Post-Op Procedure(s) (LRB): ARTHROPLASTY BIPOLAR HIP (Right)  Up with therapy  Discharge eventually when medically ready Orthopaedically stable 50 % Weight bearing on the right leg  Aquacel dressing may stay in place for 12-14 days. Norco Rx on chart   Anastasio Auerbach. Macky Galik   PAC  11/09/2012, 11:29 AM

## 2012-11-09 NOTE — Progress Notes (Signed)
Physical Therapy Treatment Patient Details Name: Christian Thomas MRN: [id number] DOB: 1915-05-15 Today's Date: 11/09/2012 Time: 1211-1240 PT Time Calculation (min): 29 min  PT Assessment / Plan / Recommendation Comments on Treatment Session  Increased time all activities    Follow Up Recommendations  SNF     Does the patient have the potential to tolerate intense rehabilitation     Barriers to Discharge        Equipment Recommendations  None recommended by PT    Recommendations for Other Services OT consult  Frequency Min 3X/week   Plan Discharge plan remains appropriate    Precautions / Restrictions Precautions Precautions: Posterior Hip Precaution Comments: pt requires cues for recall of all THP and PWB Restrictions Weight Bearing Restrictions: Yes RLE Weight Bearing: Partial weight bearing RLE Partial Weight Bearing Percentage or Pounds: 50   Pertinent Vitals/Pain Min c/o pain; premedicated    Mobility  Bed Mobility Bed Mobility: Supine to Sit Supine to Sit: 3: Mod assist Details for Bed Mobility Assistance: cues for sequence, THP and use of L LE to self assist Transfers Transfers: Sit to Stand;Stand to Sit Sit to Stand: 1: +2 Total assist;From bed;With upper extremity assist Sit to Stand: Patient Percentage: 60% Stand to Sit: 1: +2 Total assist;To chair/3-in-1;With upper extremity assist Stand to Sit: Patient Percentage: 60% Details for Transfer Assistance: cues for LE management and use of UEs to self assist Ambulation/Gait Ambulation/Gait Assistance: 1: +2 Total assist Ambulation/Gait: Patient Percentage: 60% Ambulation Distance (Feet): 4 Feet Assistive device: Rolling walker Ambulation/Gait Assistance Details: cues for posture, sequence, position from RW and increased UE WB Gait Pattern: Step-to pattern;Trunk flexed General Gait Details: ltd by c/o lightheadedness    Exercises     PT Diagnosis:    PT Problem List:   PT Treatment Interventions:      PT Goals Acute Rehab PT Goals PT Goal Formulation: With patient Time For Goal Achievement: 11/14/12 Potential to Achieve Goals: Fair Pt will go Supine/Side to Sit: with min assist PT Goal: Supine/Side to Sit - Progress: Progressing toward goal Pt will go Sit to Supine/Side: with mod assist PT Goal: Sit to Supine/Side - Progress: Progressing toward goal Pt will go Sit to Stand: with mod assist PT Goal: Sit to Stand - Progress: Progressing toward goal Pt will go Stand to Sit: with mod assist PT Goal: Stand to Sit - Progress: Progressing toward goal Pt will Ambulate: 16 - 50 feet;with +2 total assist PT Goal: Ambulate - Progress: Progressing toward goal  Visit Information  Last PT Received On: 11/09/12 Assistance Needed: +2    Subjective Data  Subjective: I wish I could stay here with you people Patient Stated Goal: Back to previous living arrangement if possible   Cognition  Overall Cognitive Status: Appears within functional limits for tasks assessed/performed Arousal/Alertness: Awake/alert Orientation Level: Appears intact for tasks assessed Behavior During Session: [facility] for tasks performed    Balance     End of Session PT - End of Session Equipment Utilized During Treatment: Gait belt Activity Tolerance: Patient tolerated treatment well;Patient limited by fatigue Nurse Communication: Mobility status   GP     Taiyana Kissler 11/09/2012, 1:02 PM

## 2012-11-09 NOTE — Progress Notes (Signed)
Clinical Social Work Department BRIEF PSYCHOSOCIAL ASSESSMENT 11/09/2012  Patient:  Christian Thomas, Christian Thomas     Account Number:  [id number]     Admit date:  11/05/2012  Clinical Social Worker:  Orpah Greek  Date/Time:  11/09/2012 10:52 AM  Referred by:  Physician  Date Referred:  11/09/2012 Referred for  SNF Placement   Other Referral:   Interview type:  Patient Other interview type:   and nephew    PSYCHOSOCIAL DATA Living Status:  FACILITY Admitted from facility:   [facility] Level of care:  Assisted Living Primary support name:  Celene Kras (niece) h#: [phone] c#: [phone] Primary support relationship to patient:  FAMILY Degree of support available:   good    CURRENT CONCERNS Current Concerns  Post-Acute Placement   Other Concerns:    SOCIAL WORK ASSESSMENT / PLAN CSW spoke with patient re: discharge planning. Patient was admitted from [facility]F but per PT/MD recommendation needing SNF at discharge.   Assessment/plan status:  Information/Referral to [facility] Other assessment/ plan:   Information/referral to community resources:   CSW completed FL2 and faxed information out to [facility].    PATIENT'S/FAMILY'S RESPONSE TO PLAN OF CARE: Patient is agreeable with plan for SNF, expressed interest in [facility]. CSW confirmed with Debbie @ SNF that they would be able to take him when stable for discharge.        Unice Bailey, LCSW [facility] Clinical Social Worker cell #: [phone]

## 2012-11-09 NOTE — Discharge Summary (Signed)
Physician Discharge Summary  VERNA HAMON VHQ:[id number] DOB: [birth year] DOA: 11/05/2012  PCP: Harlow Asa, MD  Admit date: 11/05/2012 Discharge date: 11/09/2012    Recommendations for Outpatient Follow-up:  1. Follow up with orthopedics as recommended 2. Follow up with a cbc tomorrow to check hemoglobin 3. Stopped his enalapril for concern for orthostatic hypotension, continue to hold it for 2 weeks but check BP every day for 2 weeks ,. If BP is good, enlapril can be started at a lower dose of 2.5mg  daily.   Discharge Diagnoses:  Principal Problem:  *Femoral neck fracture Active Problems:  Essential hypertension, benign  DM2 (diabetes mellitus, type 2)  Constipation   Discharge Condition: stable  Diet recommendation: carb modified diet.  Filed Weights   11/05/12 2200 11/09/12 0536  Weight: 85.5 kg (188 lb 7.9 oz) 83.5 kg (184 lb 1.4 oz)    History of present illness:  78 year old male with a history of diabetes type 2, hyperlipidemia, hypertension, and constipation presented to [facility] ED after a fall. Workup revealed a right femoral neck fracture. The patient was transferred to [facility] for surgical intervention. The patient denied any syncope, but complained of some dizziness, explained that it is mostly chronic in nature. The patient has had previous workup for syncope in the past including a Holter monitor in 2012 which was negative for any dysrhythmia. However the patient adamantly denied any syncope during this episode. Interestingly, his nursing facility, [facility], recently placed him on Xanax on Saturday. After breakfast the day of the admission, the patient felt somewhat dizzy and upon walking back to his room, the patient fell onto his right side.    Hospital Course:  Femoral neck fracture  - in the setting of what appears to be likely a mechanical fall  Orthopedic consult called and he underwent  ORIF on 12/24.  - weight bearing as recommended  by orthopedics. Continue with aspirin and plavix.   Active Problems:  DM2 (diabetes mellitus, type 2)  - continue Insulin for now and readjust the regimen as indicated  - check A1C 6.4  CBG (last 3)   Basename  11/08/12 0743  11/07/12 2107  11/07/12 1649   GLUCAP  152*  178*  131*     Essential hypertension, benign  - better controlled. He is on enalapril, which was held for possibly orthostasis. Can be restarted at a later time by PCP. Constipation  - start bowel regimen, scheduled and as needed    Procedures:  ORIF  Consultations:  Orthopedics.  Discharge Exam: Filed Vitals:   11/09/12 0536 11/09/12 0800 11/09/12 1122 11/09/12 1312  BP: 146/52   127/49  Pulse: 68   66  Temp: 98 F (36.7 C)   97.6 F (36.4 C)  TempSrc: Oral   Oral  Resp: 18 14 16 18   Height:      Weight: 83.5 kg (184 lb 1.4 oz)     SpO2: 97%   96%  General: Pt is alert but confused this AM, follows commands appropriately, not in acute distress  Cardiovascular: Regular rate and rhythm, S1/S2, no murmurs, no rubs, no gallops  Respiratory: Clear to auscultation bilaterally, no wheezing, no crackles, no rhonchi  Abdomen: Soft, non tender, non distended, bowel sounds present, no guarding  Extremities: No edema, pulses DP and PT palpable bilaterally, drain removed. aquacel dressing for 2 weeks.  Neuro: Grossly nonfocal     Discharge Instructions      Discharge Orders    Future  Orders Please Complete By Expires   Diet - low sodium heart healthy      Diet - low sodium heart healthy      Call MD / Call 911      Comments:   If you experience chest pain or shortness of breath, CALL 911 and be transported to the hospital emergency room.  If you develope a fever above 101 F, pus (white drainage) or increased drainage or redness at the wound, or calf pain, call your surgeon's office.   Discharge instructions      Comments:   Maintain surgical dressing for 10-14 days, then replace with gauze and tape.  Keep the area dry and clean until follow up. Follow up in 2 weeks at [facility]. Call with any questions or concerns.   Constipation Prevention      Comments:   Drink plenty of fluids.  Prune juice may be helpful.  You may use a stool softener, such as Colace (over the counter) 100 mg twice a day.  Use MiraLax (over the counter) for constipation as needed.   Partial weight bearing      Comments:   50% weight bearing on the right leg   Discharge instructions      Comments:   Follow up with ortho as recommended.  Check CBC tomorrow.       Medication List     As of 11/09/2012  2:04 PM    STOP taking these medications         furosemide 20 MG tablet   Commonly known as: LASIX      TAKE these medications         acetaminophen 500 MG tablet   Commonly known as: TYLENOL   Take 1,000 mg by mouth 2 (two) times daily.      allopurinol 300 MG tablet   Commonly known as: ZYLOPRIM   Take 300 mg by mouth daily.      ALPRAZolam 0.25 MG tablet   Commonly known as: XANAX   Take 1 tablet (0.25 mg total) by mouth 2 (two) times daily.      aspirin 81 MG chewable tablet   Chew 1 tablet (81 mg total) by mouth daily.      clopidogrel 75 MG tablet   Commonly known as: PLAVIX   Take 75 mg by mouth daily.      enalapril 5 MG tablet   Commonly known as: VASOTEC   Take 1 tablet (5 mg total) by mouth daily.      etodolac 300 MG capsule   Commonly known as: LODINE   Take 300 mg by mouth 2 (two) times daily with a meal.      ferrous sulfate 325 (65 FE) MG tablet   Take 1 tablet (325 mg total) by mouth 3 (three) times daily after meals.      HYDROcodone-acetaminophen 5-325 MG per tablet   Commonly known as: NORCO/VICODIN   Take 1-2 tablets by mouth every 6 (six) hours as needed for pain.      ICAPS LUTEIN-ZEAXANTHIN PO   Take 2 tablets by mouth daily.      insulin aspart 100 UNIT/ML injection   Commonly known as: novoLOG   Inject 0-5 Units into the skin 3 (three) times  daily with meals.      lactulose 10 GM/15ML solution   Commonly known as: CHRONULAC   Take 15 g by mouth daily as needed. constipation      lovastatin 40 MG tablet  Commonly known as: MEVACOR   Take 40 mg by mouth at bedtime.      meclizine 25 MG tablet   Commonly known as: ANTIVERT   Take 25 mg by mouth every 6 (six) hours as needed. dizziness      metFORMIN 500 MG tablet   Commonly known as: GLUCOPHAGE   Take 500 mg by mouth 2 (two) times daily.      omeprazole 20 MG capsule   Commonly known as: PRILOSEC   Take 20 mg by mouth daily.      polyethylene glycol powder powder   Commonly known as: GLYCOLAX/MIRALAX   Take 17 g by mouth 3 (three) times daily.      senna 8.6 MG Tabs   Commonly known as: SENOKOT   Take 2 tablets (17.2 mg total) by mouth at bedtime.      terazosin 5 MG capsule   Commonly known as: HYTRIN   Take 5 mg by mouth daily.      traMADol 50 MG tablet   Commonly known as: ULTRAM   Take 50 mg by mouth 2 (two) times daily as needed. pain        Follow-up Information    Follow up with Shelda Pal, MD. Schedule an appointment as soon as possible for a visit in 2 weeks.   Contact information:   [address] [place] [address] [phone]       Schedule an appointment as soon as possible for a visit with Harlow Asa, MD.   Contact information:   [address] [place] [address] [phone]           The results of significant diagnostics from this hospitalization (including imaging, microbiology, ancillary and laboratory) are listed below for reference.    Significant Diagnostic Studies: Dg Chest 2 View  11/05/2012  *RADIOLOGY REPORT*  Clinical Data: Larey Seat today with right hip and leg pain  CHEST - 2 VIEW  Comparison: None.  Findings: No active infiltrate or effusion is seen.  The heart is mildly enlarged.  No acute bony abnormality is noted with the bones being osteopenic.  IMPRESSION: Cardiomegaly.  No  active lung disease.   Original Report Authenticated By: Dwyane Dee, M.D.    Dg Hip Complete Right  11/05/2012  *RADIOLOGY REPORT*  Clinical Data: Larey Seat today with right hip and knee pain  RIGHT HIP - COMPLETE 2+ VIEW  Comparison: None.  Findings: There is an acute right femoral neck fracture with varus deformity.  There are mild degenerative changes of both hips for age.  The pelvic rami are intact.  IMPRESSION: Acute right femoral neck fracture with varus deformity.   Original Report Authenticated By: Dwyane Dee, M.D.    Dg Knee 2 Views Right  11/05/2012  *RADIOLOGY REPORT*  Clinical Data: Larey Seat today with right hip and knee pain  RIGHT KNEE - 1-2 VIEW  Comparison: None.  Findings: There is tricompartmental degenerative joint disease of the right knee with loss of joint space, sclerosis and spurring. No fracture is seen.  No joint effusion is noted.  IMPRESSION: No acute abnormality.  Tricompartmental degenerative joint disease.   Original Report Authenticated By: Dwyane Dee, M.D.    Dg Pelvis Portable  11/07/2012  *RADIOLOGY REPORT*  Clinical Data: 78 year old male status post hip surgery.  PORTABLE PELVIS  Comparison: Preoperative study 11/05/2012.  Findings: AP portable pelvis at 1552 hours.  Sequelae of right femoral arthroplasty.  Femoral head component appears normally aligned with the  acetabulum on this single view.  Postoperative drain in place.  No unexpected osseous changes.  Stable left hip and pelvis.  IMPRESSION: Right proximal femur arthroplasty with no adverse features identified.   Original Report Authenticated By: Erskine Speed, M.D.    Dg Chest Portable 1 View  11/05/2012  *RADIOLOGY REPORT*  Clinical Data: Preoperative films for patient with hip fracture.  PORTABLE CHEST - 1 VIEW  Comparison: Plain film chest 11/05/2012 at 16:03 hours.  Findings: Cardiomegaly is again seen.  Mild basilar atelectasis noted. No pneumothorax or pleural effusion.  IMPRESSION: No acute finding.   Original  Report Authenticated By: Holley Dexter, M.D.    Dg Foot Complete Right  11/05/2012  *RADIOLOGY REPORT*  Clinical Data: Larey Seat today with right hip and knee pain  RIGHT FOOT COMPLETE - 3+ VIEW  Comparison: None.  Findings: Tarsal - metatarsal alignment is normal.  No fracture is seen.  There is what appears to be a erosion in the distal right first metatarsal just proximal to the first MTP joint.  This could represent arthritis, such as gout.  Clinical correlation is recommended.  There is degenerative change at the right first MTP joint as well.  IMPRESSION:  1.  No fracture. 2.  Erosion involving the distal right first metatarsal.  Question gout.   Original Report Authenticated By: Dwyane Dee, M.D.     Microbiology: Recent Results (from the past 240 hour(s))  SURGICAL PCR SCREEN     Status: Normal   Collection Time   11/07/12 12:20 PM      Component Value Range Status Comment   MRSA, PCR NEGATIVE  NEGATIVE Final    Staphylococcus aureus NEGATIVE  NEGATIVE Final      Labs: Basic Metabolic Panel:  Lab 11/09/12 1610 11/08/12 0456 11/07/12 0455 11/05/12 2325  NA 136 135 139 136  K 4.1 4.2 3.9 4.5  CL 105 103 109 102  CO2 24 23 22 25   GLUCOSE 137* 188* 94 220*  BUN 26* 20 15 27*  CREATININE 1.24 1.09 0.86 1.10  CALCIUM 8.3* 8.1* 8.5 8.6  MG -- -- -- --  PHOS -- -- -- --   Liver Function Tests:  Lab 11/05/12 2325  AST 15  ALT 15  ALKPHOS 57  BILITOT 0.3  PROT 5.7*  ALBUMIN 3.0*   No results found for this basename: LIPASE:5,AMYLASE:5 in the last 168 hours No results found for this basename: AMMONIA:5 in the last 168 hours CBC:  Lab 11/09/12 0410 11/08/12 0456 11/07/12 0455 11/05/12 2325  WBC 6.9 7.7 7.6 10.0  NEUTROABS -- -- -- 7.3  HGB 9.5* 10.6* 12.1* 11.9*  HCT 28.6* 31.1* 36.2* 35.3*  MCV 97.3 96.9 96.5 96.7  PLT 168 171 173 168   Cardiac Enzymes: No results found for this basename: CKTOTAL:5,CKMB:5,CKMBINDEX:5,TROPONINI:5 in the last 168 hours BNP: BNP (last  3 results) No results found for this basename: PROBNP:3 in the last 8760 hours CBG:  Lab 11/09/12 1146 11/09/12 0745 11/08/12 2132 11/08/12 1641 11/08/12 1136  GLUCAP 137* 113* 170* 191* 154*       Signed:  Dynesha Woolen  [facility] 11/09/2012, 2:04 PM

## 2012-11-09 NOTE — Evaluation (Signed)
Occupational Therapy Evaluation Patient Details Name: Christian Thomas MRN: [id number] DOB: Apr 17, 1915 Today's Date: 11/09/2012 Time: 8295-6213 OT Time Calculation (min): 28 min  OT Assessment / Plan / Recommendation Clinical Impression  this 78 year old man was admitted for R femoral neck fx.  He is s/p hemiarthroplasty and has posterior THPs as well as PWB.  Pt has a h/o dizziness, HOH and macular degeneration.  He is appropriate for skilled OT to increase safety and independence with adls with mod A level goals in acute.    OT Assessment  Patient needs continued OT Services    Follow Up Recommendations  SNF    Barriers to Discharge      Equipment Recommendations  3 in 1 bedside comode    Recommendations for Other Services    Frequency  Min 2X/week    Precautions / Restrictions Precautions Precautions: Posterior Hip Precaution Comments: pt requires cues for recall of all THP and PWB Restrictions Weight Bearing Restrictions: Yes RLE Weight Bearing: Partial weight bearing RLE Partial Weight Bearing Percentage or Pounds: 50   Pertinent Vitals/Pain No pain initially.      ADL  Grooming: Simulated;Set up Where Assessed - Grooming: Unsupported sitting Upper Body Bathing: Simulated;Supervision/safety Where Assessed - Upper Body Bathing: Unsupported sitting Lower Body Bathing: Simulated;+2 Total assistance Lower Body Bathing: Patient Percentage: 50% Where Assessed - Lower Body Bathing: Supported sit to stand Upper Body Dressing: Simulated;Supervision/safety Where Assessed - Upper Body Dressing: Unsupported sitting Lower Body Dressing: Simulated;+2 Total assistance Lower Body Dressing: Patient Percentage: 20% Where Assessed - Lower Body Dressing: Supported sit to stand Toilet Transfer: Simulated;+2 Total assistance Toilet Transfer: Patient Percentage: 60% Statistician Method: Sit to Barista:  (bed ambulated a few steps to recliner) Toileting -  Architect and Hygiene: Simulated;+2 Total assistance Toileting - Architect and Hygiene: Patient Percentage: 60% Where Assessed - Glass blower/designer Manipulation and Hygiene: Standing Equipment Used: Gait belt;Rolling walker Transfers/Ambulation Related to ADLs: pt c/o being a little lightheaded.  See BPs above ADL Comments: Educated on THPS and AE.  Demonstrated sock aid and showed sponge, reacher,but pt did not use any today.  Pt recalled 1/3 thps plus PWB    OT Diagnosis: Generalized weakness  OT Problem List: Decreased strength;Decreased activity tolerance;Decreased knowledge of use of DME or AE;Decreased knowledge of precautions;Pain;Impaired vision/perception OT Treatment Interventions: Self-care/ADL training;DME and/or AE instruction;Patient/family education;Balance training;Therapeutic activities   OT Goals Acute Rehab OT Goals OT Goal Formulation: With patient Time For Goal Achievement: 11/16/12 Potential to Achieve Goals: Good ADL Goals Pt Will Perform Lower Body Bathing: Sit to stand from chair;with adaptive equipment;with min assist ADL Goal: Lower Body Bathing - Progress: Goal set today Pt Will Perform Lower Body Dressing: with mod assist;Sit to stand from chair;with adaptive equipment ADL Goal: Lower Body Dressing - Progress: Goal set today Pt Will Transfer to Toilet: with mod assist;Stand pivot transfer;3-in-1 ADL Goal: Toilet Transfer - Progress: Goal set today Pt Will Perform Toileting - Hygiene: with min assist;Standing at 3-in-1/toilet ADL Goal: Toileting - Hygiene - Progress: Goal set today Miscellaneous OT Goals Miscellaneous OT Goal #1: Pt will recall 3/3 thps OT Goal: Miscellaneous Goal #1 - Progress: Goal set today  Visit Information  Last OT Received On: 11/09/12 Assistance Needed: +2 PT/OT Co-Evaluation/Treatment: Yes    Subjective Data  Subjective: I'm sorry, I can't hear you.   Patient Stated Goal: None stated.  Agreeable to  OT/PT   Prior Functioning     Home Living  Additional Comments: has been at ALF for 6 months since wife died Prior Function Level of Independence: Independent with assistive device(s) Communication Communication: HOH (R ear better) Dominant Hand: Right         Vision/Perception     Cognition  Overall Cognitive Status: Appears within functional limits for tasks assessed/performed Arousal/Alertness: Awake/alert Orientation Level: Appears intact for tasks assessed Behavior During Session: [facility] for tasks performed    Extremity/Trunk Assessment Right Upper Extremity Assessment RUE ROM/Strength/Tone: [facility] for tasks assessed Left Upper Extremity Assessment LUE ROM/Strength/Tone: WFL for tasks assessed     Mobility Bed Mobility Bed Mobility: Supine to Sit Supine to Sit: 3: Mod assist Details for Bed Mobility Assistance: cues for sequence, THP and use of L LE to self assist Transfers Sit to Stand: 1: +2 Total assist;From bed;With upper extremity assist Sit to Stand: Patient Percentage: 60% Stand to Sit: 1: +2 Total assist;To chair/3-in-1;With upper extremity assist Stand to Sit: Patient Percentage: 60% Details for Transfer Assistance: cues for LE management and use of UEs to self assist     Shoulder Instructions     Exercise     Balance     End of Session OT - End of Session Activity Tolerance:  (limited by lightheadedness) Patient left: in chair;with call bell/phone within reach;with chair alarm set  GO     [name] 11/09/2012, 1:37 PM Marica Otter, OTR/L [phone] 11/09/2012

## 2012-11-12 DIAGNOSIS — S72009A Fracture of unspecified part of neck of unspecified femur, initial encounter for closed fracture: Secondary | ICD-10-CM | POA: Diagnosis not present

## 2012-11-12 DIAGNOSIS — I951 Orthostatic hypotension: Secondary | ICD-10-CM | POA: Diagnosis not present

## 2012-11-12 DIAGNOSIS — E46 Unspecified protein-calorie malnutrition: Secondary | ICD-10-CM | POA: Diagnosis not present

## 2012-11-12 DIAGNOSIS — E119 Type 2 diabetes mellitus without complications: Secondary | ICD-10-CM | POA: Diagnosis not present

## 2012-11-27 DIAGNOSIS — R279 Unspecified lack of coordination: Secondary | ICD-10-CM | POA: Diagnosis not present

## 2012-11-27 DIAGNOSIS — Z96649 Presence of unspecified artificial hip joint: Secondary | ICD-10-CM | POA: Diagnosis not present

## 2012-11-27 DIAGNOSIS — Z471 Aftercare following joint replacement surgery: Secondary | ICD-10-CM | POA: Diagnosis not present

## 2012-11-27 DIAGNOSIS — E119 Type 2 diabetes mellitus without complications: Secondary | ICD-10-CM | POA: Diagnosis not present

## 2012-11-27 DIAGNOSIS — M1990 Unspecified osteoarthritis, unspecified site: Secondary | ICD-10-CM | POA: Diagnosis not present

## 2012-11-28 DIAGNOSIS — Z471 Aftercare following joint replacement surgery: Secondary | ICD-10-CM | POA: Diagnosis not present

## 2012-11-28 DIAGNOSIS — Z96649 Presence of unspecified artificial hip joint: Secondary | ICD-10-CM | POA: Diagnosis not present

## 2012-11-28 DIAGNOSIS — E119 Type 2 diabetes mellitus without complications: Secondary | ICD-10-CM | POA: Diagnosis not present

## 2012-11-28 DIAGNOSIS — M1990 Unspecified osteoarthritis, unspecified site: Secondary | ICD-10-CM | POA: Diagnosis not present

## 2012-11-28 DIAGNOSIS — R279 Unspecified lack of coordination: Secondary | ICD-10-CM | POA: Diagnosis not present

## 2012-11-29 DIAGNOSIS — E119 Type 2 diabetes mellitus without complications: Secondary | ICD-10-CM | POA: Diagnosis not present

## 2012-11-29 DIAGNOSIS — Z96649 Presence of unspecified artificial hip joint: Secondary | ICD-10-CM | POA: Diagnosis not present

## 2012-11-29 DIAGNOSIS — M1990 Unspecified osteoarthritis, unspecified site: Secondary | ICD-10-CM | POA: Diagnosis not present

## 2012-11-29 DIAGNOSIS — Z471 Aftercare following joint replacement surgery: Secondary | ICD-10-CM | POA: Diagnosis not present

## 2012-11-29 DIAGNOSIS — R279 Unspecified lack of coordination: Secondary | ICD-10-CM | POA: Diagnosis not present

## 2012-11-30 DIAGNOSIS — R279 Unspecified lack of coordination: Secondary | ICD-10-CM | POA: Diagnosis not present

## 2012-11-30 DIAGNOSIS — M1990 Unspecified osteoarthritis, unspecified site: Secondary | ICD-10-CM | POA: Diagnosis not present

## 2012-11-30 DIAGNOSIS — Z96649 Presence of unspecified artificial hip joint: Secondary | ICD-10-CM | POA: Diagnosis not present

## 2012-11-30 DIAGNOSIS — Z471 Aftercare following joint replacement surgery: Secondary | ICD-10-CM | POA: Diagnosis not present

## 2012-11-30 DIAGNOSIS — E119 Type 2 diabetes mellitus without complications: Secondary | ICD-10-CM | POA: Diagnosis not present

## 2012-12-01 DIAGNOSIS — Z471 Aftercare following joint replacement surgery: Secondary | ICD-10-CM | POA: Diagnosis not present

## 2012-12-01 DIAGNOSIS — R279 Unspecified lack of coordination: Secondary | ICD-10-CM | POA: Diagnosis not present

## 2012-12-01 DIAGNOSIS — E119 Type 2 diabetes mellitus without complications: Secondary | ICD-10-CM | POA: Diagnosis not present

## 2012-12-01 DIAGNOSIS — M1990 Unspecified osteoarthritis, unspecified site: Secondary | ICD-10-CM | POA: Diagnosis not present

## 2012-12-01 DIAGNOSIS — Z96649 Presence of unspecified artificial hip joint: Secondary | ICD-10-CM | POA: Diagnosis not present

## 2012-12-04 DIAGNOSIS — R279 Unspecified lack of coordination: Secondary | ICD-10-CM | POA: Diagnosis not present

## 2012-12-04 DIAGNOSIS — Z96649 Presence of unspecified artificial hip joint: Secondary | ICD-10-CM | POA: Diagnosis not present

## 2012-12-04 DIAGNOSIS — H6120 Impacted cerumen, unspecified ear: Secondary | ICD-10-CM | POA: Diagnosis not present

## 2012-12-04 DIAGNOSIS — Z471 Aftercare following joint replacement surgery: Secondary | ICD-10-CM | POA: Diagnosis not present

## 2012-12-04 DIAGNOSIS — M1990 Unspecified osteoarthritis, unspecified site: Secondary | ICD-10-CM | POA: Diagnosis not present

## 2012-12-04 DIAGNOSIS — E119 Type 2 diabetes mellitus without complications: Secondary | ICD-10-CM | POA: Diagnosis not present

## 2012-12-05 DIAGNOSIS — E119 Type 2 diabetes mellitus without complications: Secondary | ICD-10-CM | POA: Diagnosis not present

## 2012-12-05 DIAGNOSIS — Z471 Aftercare following joint replacement surgery: Secondary | ICD-10-CM | POA: Diagnosis not present

## 2012-12-05 DIAGNOSIS — Z96649 Presence of unspecified artificial hip joint: Secondary | ICD-10-CM | POA: Diagnosis not present

## 2012-12-05 DIAGNOSIS — R279 Unspecified lack of coordination: Secondary | ICD-10-CM | POA: Diagnosis not present

## 2012-12-05 DIAGNOSIS — M1990 Unspecified osteoarthritis, unspecified site: Secondary | ICD-10-CM | POA: Diagnosis not present

## 2012-12-07 DIAGNOSIS — E119 Type 2 diabetes mellitus without complications: Secondary | ICD-10-CM | POA: Diagnosis not present

## 2012-12-07 DIAGNOSIS — R279 Unspecified lack of coordination: Secondary | ICD-10-CM | POA: Diagnosis not present

## 2012-12-07 DIAGNOSIS — Z96649 Presence of unspecified artificial hip joint: Secondary | ICD-10-CM | POA: Diagnosis not present

## 2012-12-07 DIAGNOSIS — M1990 Unspecified osteoarthritis, unspecified site: Secondary | ICD-10-CM | POA: Diagnosis not present

## 2012-12-07 DIAGNOSIS — Z471 Aftercare following joint replacement surgery: Secondary | ICD-10-CM | POA: Diagnosis not present

## 2012-12-08 DIAGNOSIS — M1990 Unspecified osteoarthritis, unspecified site: Secondary | ICD-10-CM | POA: Diagnosis not present

## 2012-12-08 DIAGNOSIS — R279 Unspecified lack of coordination: Secondary | ICD-10-CM | POA: Diagnosis not present

## 2012-12-08 DIAGNOSIS — Z96649 Presence of unspecified artificial hip joint: Secondary | ICD-10-CM | POA: Diagnosis not present

## 2012-12-08 DIAGNOSIS — E119 Type 2 diabetes mellitus without complications: Secondary | ICD-10-CM | POA: Diagnosis not present

## 2012-12-08 DIAGNOSIS — Z471 Aftercare following joint replacement surgery: Secondary | ICD-10-CM | POA: Diagnosis not present

## 2012-12-11 DIAGNOSIS — Z96649 Presence of unspecified artificial hip joint: Secondary | ICD-10-CM | POA: Diagnosis not present

## 2012-12-11 DIAGNOSIS — M1990 Unspecified osteoarthritis, unspecified site: Secondary | ICD-10-CM | POA: Diagnosis not present

## 2012-12-11 DIAGNOSIS — E119 Type 2 diabetes mellitus without complications: Secondary | ICD-10-CM | POA: Diagnosis not present

## 2012-12-11 DIAGNOSIS — Z471 Aftercare following joint replacement surgery: Secondary | ICD-10-CM | POA: Diagnosis not present

## 2012-12-11 DIAGNOSIS — R279 Unspecified lack of coordination: Secondary | ICD-10-CM | POA: Diagnosis not present

## 2012-12-14 DIAGNOSIS — E119 Type 2 diabetes mellitus without complications: Secondary | ICD-10-CM | POA: Diagnosis not present

## 2012-12-14 DIAGNOSIS — R279 Unspecified lack of coordination: Secondary | ICD-10-CM | POA: Diagnosis not present

## 2012-12-14 DIAGNOSIS — Z96649 Presence of unspecified artificial hip joint: Secondary | ICD-10-CM | POA: Diagnosis not present

## 2012-12-14 DIAGNOSIS — M1990 Unspecified osteoarthritis, unspecified site: Secondary | ICD-10-CM | POA: Diagnosis not present

## 2012-12-14 DIAGNOSIS — Z471 Aftercare following joint replacement surgery: Secondary | ICD-10-CM | POA: Diagnosis not present

## 2012-12-16 DIAGNOSIS — Z471 Aftercare following joint replacement surgery: Secondary | ICD-10-CM | POA: Diagnosis not present

## 2012-12-16 DIAGNOSIS — Z96649 Presence of unspecified artificial hip joint: Secondary | ICD-10-CM | POA: Diagnosis not present

## 2012-12-16 DIAGNOSIS — E119 Type 2 diabetes mellitus without complications: Secondary | ICD-10-CM | POA: Diagnosis not present

## 2012-12-16 DIAGNOSIS — R279 Unspecified lack of coordination: Secondary | ICD-10-CM | POA: Diagnosis not present

## 2012-12-16 DIAGNOSIS — M1990 Unspecified osteoarthritis, unspecified site: Secondary | ICD-10-CM | POA: Diagnosis not present

## 2012-12-19 DIAGNOSIS — J019 Acute sinusitis, unspecified: Secondary | ICD-10-CM | POA: Diagnosis not present

## 2012-12-19 DIAGNOSIS — J42 Unspecified chronic bronchitis: Secondary | ICD-10-CM | POA: Diagnosis not present

## 2012-12-20 DIAGNOSIS — Z471 Aftercare following joint replacement surgery: Secondary | ICD-10-CM | POA: Diagnosis not present

## 2012-12-20 DIAGNOSIS — B351 Tinea unguium: Secondary | ICD-10-CM | POA: Diagnosis not present

## 2012-12-20 DIAGNOSIS — Z96649 Presence of unspecified artificial hip joint: Secondary | ICD-10-CM | POA: Diagnosis not present

## 2012-12-20 DIAGNOSIS — M1990 Unspecified osteoarthritis, unspecified site: Secondary | ICD-10-CM | POA: Diagnosis not present

## 2012-12-20 DIAGNOSIS — R279 Unspecified lack of coordination: Secondary | ICD-10-CM | POA: Diagnosis not present

## 2012-12-20 DIAGNOSIS — E119 Type 2 diabetes mellitus without complications: Secondary | ICD-10-CM | POA: Diagnosis not present

## 2012-12-20 DIAGNOSIS — M79609 Pain in unspecified limb: Secondary | ICD-10-CM | POA: Diagnosis not present

## 2012-12-21 DIAGNOSIS — Z96649 Presence of unspecified artificial hip joint: Secondary | ICD-10-CM | POA: Diagnosis not present

## 2012-12-21 DIAGNOSIS — Z471 Aftercare following joint replacement surgery: Secondary | ICD-10-CM | POA: Diagnosis not present

## 2012-12-21 DIAGNOSIS — R279 Unspecified lack of coordination: Secondary | ICD-10-CM | POA: Diagnosis not present

## 2012-12-21 DIAGNOSIS — M1990 Unspecified osteoarthritis, unspecified site: Secondary | ICD-10-CM | POA: Diagnosis not present

## 2012-12-21 DIAGNOSIS — E119 Type 2 diabetes mellitus without complications: Secondary | ICD-10-CM | POA: Diagnosis not present

## 2012-12-22 DIAGNOSIS — E119 Type 2 diabetes mellitus without complications: Secondary | ICD-10-CM | POA: Diagnosis not present

## 2012-12-22 DIAGNOSIS — Z96649 Presence of unspecified artificial hip joint: Secondary | ICD-10-CM | POA: Diagnosis not present

## 2012-12-22 DIAGNOSIS — R279 Unspecified lack of coordination: Secondary | ICD-10-CM | POA: Diagnosis not present

## 2012-12-22 DIAGNOSIS — Z471 Aftercare following joint replacement surgery: Secondary | ICD-10-CM | POA: Diagnosis not present

## 2012-12-22 DIAGNOSIS — M1990 Unspecified osteoarthritis, unspecified site: Secondary | ICD-10-CM | POA: Diagnosis not present

## 2012-12-26 DIAGNOSIS — M1990 Unspecified osteoarthritis, unspecified site: Secondary | ICD-10-CM | POA: Diagnosis not present

## 2012-12-26 DIAGNOSIS — R279 Unspecified lack of coordination: Secondary | ICD-10-CM | POA: Diagnosis not present

## 2012-12-26 DIAGNOSIS — E119 Type 2 diabetes mellitus without complications: Secondary | ICD-10-CM | POA: Diagnosis not present

## 2012-12-26 DIAGNOSIS — Z471 Aftercare following joint replacement surgery: Secondary | ICD-10-CM | POA: Diagnosis not present

## 2012-12-26 DIAGNOSIS — Z96649 Presence of unspecified artificial hip joint: Secondary | ICD-10-CM | POA: Diagnosis not present

## 2012-12-29 DIAGNOSIS — R279 Unspecified lack of coordination: Secondary | ICD-10-CM | POA: Diagnosis not present

## 2012-12-29 DIAGNOSIS — Z471 Aftercare following joint replacement surgery: Secondary | ICD-10-CM | POA: Diagnosis not present

## 2012-12-29 DIAGNOSIS — Z96649 Presence of unspecified artificial hip joint: Secondary | ICD-10-CM | POA: Diagnosis not present

## 2012-12-29 DIAGNOSIS — E119 Type 2 diabetes mellitus without complications: Secondary | ICD-10-CM | POA: Diagnosis not present

## 2012-12-29 DIAGNOSIS — M1990 Unspecified osteoarthritis, unspecified site: Secondary | ICD-10-CM | POA: Diagnosis not present

## 2013-01-02 DIAGNOSIS — M129 Arthropathy, unspecified: Secondary | ICD-10-CM | POA: Diagnosis not present

## 2013-01-02 DIAGNOSIS — M1990 Unspecified osteoarthritis, unspecified site: Secondary | ICD-10-CM | POA: Diagnosis not present

## 2013-01-02 DIAGNOSIS — Z471 Aftercare following joint replacement surgery: Secondary | ICD-10-CM | POA: Diagnosis not present

## 2013-01-02 DIAGNOSIS — M2550 Pain in unspecified joint: Secondary | ICD-10-CM | POA: Diagnosis not present

## 2013-01-02 DIAGNOSIS — J45909 Unspecified asthma, uncomplicated: Secondary | ICD-10-CM | POA: Diagnosis not present

## 2013-01-02 DIAGNOSIS — Z96649 Presence of unspecified artificial hip joint: Secondary | ICD-10-CM | POA: Diagnosis not present

## 2013-01-02 DIAGNOSIS — I10 Essential (primary) hypertension: Secondary | ICD-10-CM | POA: Diagnosis not present

## 2013-01-02 DIAGNOSIS — R279 Unspecified lack of coordination: Secondary | ICD-10-CM | POA: Diagnosis not present

## 2013-01-02 DIAGNOSIS — N401 Enlarged prostate with lower urinary tract symptoms: Secondary | ICD-10-CM | POA: Diagnosis not present

## 2013-01-02 DIAGNOSIS — E119 Type 2 diabetes mellitus without complications: Secondary | ICD-10-CM | POA: Diagnosis not present

## 2013-01-03 DIAGNOSIS — Z96649 Presence of unspecified artificial hip joint: Secondary | ICD-10-CM | POA: Diagnosis not present

## 2013-01-10 DIAGNOSIS — R269 Unspecified abnormalities of gait and mobility: Secondary | ICD-10-CM | POA: Diagnosis not present

## 2013-01-11 DIAGNOSIS — M6281 Muscle weakness (generalized): Secondary | ICD-10-CM | POA: Diagnosis not present

## 2013-01-11 DIAGNOSIS — R269 Unspecified abnormalities of gait and mobility: Secondary | ICD-10-CM | POA: Diagnosis not present

## 2013-01-11 DIAGNOSIS — M545 Low back pain: Secondary | ICD-10-CM | POA: Diagnosis not present

## 2013-01-15 DIAGNOSIS — R269 Unspecified abnormalities of gait and mobility: Secondary | ICD-10-CM | POA: Diagnosis not present

## 2013-01-15 DIAGNOSIS — M545 Low back pain, unspecified: Secondary | ICD-10-CM | POA: Diagnosis not present

## 2013-01-15 DIAGNOSIS — M6281 Muscle weakness (generalized): Secondary | ICD-10-CM | POA: Diagnosis not present

## 2013-01-15 DIAGNOSIS — S72009D Fracture of unspecified part of neck of unspecified femur, subsequent encounter for closed fracture with routine healing: Secondary | ICD-10-CM | POA: Diagnosis not present

## 2013-02-06 DIAGNOSIS — M79609 Pain in unspecified limb: Secondary | ICD-10-CM | POA: Diagnosis not present

## 2013-02-06 DIAGNOSIS — S92919A Unspecified fracture of unspecified toe(s), initial encounter for closed fracture: Secondary | ICD-10-CM | POA: Diagnosis not present

## 2013-02-06 DIAGNOSIS — L600 Ingrowing nail: Secondary | ICD-10-CM | POA: Diagnosis not present

## 2013-02-07 ENCOUNTER — Telehealth: Payer: Self-pay | Admitting: Family Medicine

## 2013-02-07 MED ORDER — OMEPRAZOLE 20 MG PO CPDR: 20.0000 mg | DELAYED_RELEASE_CAPSULE | Freq: Every day | ORAL | Status: DC

## 2013-02-07 NOTE — Telephone Encounter (Signed)
prilosec sent in, await xanax rf

## 2013-02-07 NOTE — Telephone Encounter (Signed)
Hard fax found, no mssg needed

## 2013-02-08 ENCOUNTER — Other Ambulatory Visit: Payer: Self-pay | Admitting: *Deleted

## 2013-02-08 MED ORDER — CLOPIDOGREL BISULFATE 75 MG PO TABS: 75.0000 mg | ORAL_TABLET | Freq: Every day | ORAL | Status: DC

## 2013-02-14 DIAGNOSIS — M545 Low back pain: Secondary | ICD-10-CM | POA: Diagnosis not present

## 2013-02-14 DIAGNOSIS — Z4789 Encounter for other orthopedic aftercare: Secondary | ICD-10-CM | POA: Diagnosis not present

## 2013-02-14 DIAGNOSIS — S72009D Fracture of unspecified part of neck of unspecified femur, subsequent encounter for closed fracture with routine healing: Secondary | ICD-10-CM | POA: Diagnosis not present

## 2013-02-14 DIAGNOSIS — Z96649 Presence of unspecified artificial hip joint: Secondary | ICD-10-CM | POA: Diagnosis not present

## 2013-02-14 DIAGNOSIS — M1710 Unilateral primary osteoarthritis, unspecified knee: Secondary | ICD-10-CM | POA: Diagnosis not present

## 2013-02-14 DIAGNOSIS — M6281 Muscle weakness (generalized): Secondary | ICD-10-CM | POA: Diagnosis not present

## 2013-02-14 DIAGNOSIS — R269 Unspecified abnormalities of gait and mobility: Secondary | ICD-10-CM | POA: Diagnosis not present

## 2013-02-15 DIAGNOSIS — M545 Low back pain: Secondary | ICD-10-CM | POA: Diagnosis not present

## 2013-02-15 DIAGNOSIS — S72009D Fracture of unspecified part of neck of unspecified femur, subsequent encounter for closed fracture with routine healing: Secondary | ICD-10-CM | POA: Diagnosis not present

## 2013-02-15 DIAGNOSIS — M6281 Muscle weakness (generalized): Secondary | ICD-10-CM | POA: Diagnosis not present

## 2013-02-15 DIAGNOSIS — R269 Unspecified abnormalities of gait and mobility: Secondary | ICD-10-CM | POA: Diagnosis not present

## 2013-02-16 DIAGNOSIS — M545 Low back pain: Secondary | ICD-10-CM | POA: Diagnosis not present

## 2013-02-16 DIAGNOSIS — R269 Unspecified abnormalities of gait and mobility: Secondary | ICD-10-CM | POA: Diagnosis not present

## 2013-02-16 DIAGNOSIS — M6281 Muscle weakness (generalized): Secondary | ICD-10-CM | POA: Diagnosis not present

## 2013-02-16 DIAGNOSIS — S72009D Fracture of unspecified part of neck of unspecified femur, subsequent encounter for closed fracture with routine healing: Secondary | ICD-10-CM | POA: Diagnosis not present

## 2013-02-19 DIAGNOSIS — S72009D Fracture of unspecified part of neck of unspecified femur, subsequent encounter for closed fracture with routine healing: Secondary | ICD-10-CM | POA: Diagnosis not present

## 2013-02-19 DIAGNOSIS — M545 Low back pain: Secondary | ICD-10-CM | POA: Diagnosis not present

## 2013-02-19 DIAGNOSIS — R269 Unspecified abnormalities of gait and mobility: Secondary | ICD-10-CM | POA: Diagnosis not present

## 2013-02-19 DIAGNOSIS — M6281 Muscle weakness (generalized): Secondary | ICD-10-CM | POA: Diagnosis not present

## 2013-02-20 DIAGNOSIS — M6281 Muscle weakness (generalized): Secondary | ICD-10-CM | POA: Diagnosis not present

## 2013-02-20 DIAGNOSIS — R269 Unspecified abnormalities of gait and mobility: Secondary | ICD-10-CM | POA: Diagnosis not present

## 2013-02-20 DIAGNOSIS — S72009D Fracture of unspecified part of neck of unspecified femur, subsequent encounter for closed fracture with routine healing: Secondary | ICD-10-CM | POA: Diagnosis not present

## 2013-02-20 DIAGNOSIS — M545 Low back pain: Secondary | ICD-10-CM | POA: Diagnosis not present

## 2013-02-21 DIAGNOSIS — R269 Unspecified abnormalities of gait and mobility: Secondary | ICD-10-CM | POA: Diagnosis not present

## 2013-02-21 DIAGNOSIS — M6281 Muscle weakness (generalized): Secondary | ICD-10-CM | POA: Diagnosis not present

## 2013-02-21 DIAGNOSIS — S72009D Fracture of unspecified part of neck of unspecified femur, subsequent encounter for closed fracture with routine healing: Secondary | ICD-10-CM | POA: Diagnosis not present

## 2013-02-21 DIAGNOSIS — M545 Low back pain: Secondary | ICD-10-CM | POA: Diagnosis not present

## 2013-02-22 DIAGNOSIS — R269 Unspecified abnormalities of gait and mobility: Secondary | ICD-10-CM | POA: Diagnosis not present

## 2013-02-22 DIAGNOSIS — M545 Low back pain: Secondary | ICD-10-CM | POA: Diagnosis not present

## 2013-02-22 DIAGNOSIS — M6281 Muscle weakness (generalized): Secondary | ICD-10-CM | POA: Diagnosis not present

## 2013-02-22 DIAGNOSIS — S72009D Fracture of unspecified part of neck of unspecified femur, subsequent encounter for closed fracture with routine healing: Secondary | ICD-10-CM | POA: Diagnosis not present

## 2013-02-23 ENCOUNTER — Other Ambulatory Visit: Payer: Self-pay | Admitting: Family Medicine

## 2013-02-23 DIAGNOSIS — R269 Unspecified abnormalities of gait and mobility: Secondary | ICD-10-CM | POA: Diagnosis not present

## 2013-02-23 DIAGNOSIS — M545 Low back pain: Secondary | ICD-10-CM | POA: Diagnosis not present

## 2013-02-23 DIAGNOSIS — M6281 Muscle weakness (generalized): Secondary | ICD-10-CM | POA: Diagnosis not present

## 2013-02-23 DIAGNOSIS — S72009D Fracture of unspecified part of neck of unspecified femur, subsequent encounter for closed fracture with routine healing: Secondary | ICD-10-CM | POA: Diagnosis not present

## 2013-02-26 DIAGNOSIS — M545 Low back pain: Secondary | ICD-10-CM | POA: Diagnosis not present

## 2013-02-26 DIAGNOSIS — S72009D Fracture of unspecified part of neck of unspecified femur, subsequent encounter for closed fracture with routine healing: Secondary | ICD-10-CM | POA: Diagnosis not present

## 2013-02-26 DIAGNOSIS — R269 Unspecified abnormalities of gait and mobility: Secondary | ICD-10-CM | POA: Diagnosis not present

## 2013-02-26 DIAGNOSIS — M6281 Muscle weakness (generalized): Secondary | ICD-10-CM | POA: Diagnosis not present

## 2013-02-27 DIAGNOSIS — M545 Low back pain: Secondary | ICD-10-CM | POA: Diagnosis not present

## 2013-02-27 DIAGNOSIS — M6281 Muscle weakness (generalized): Secondary | ICD-10-CM | POA: Diagnosis not present

## 2013-02-27 DIAGNOSIS — R269 Unspecified abnormalities of gait and mobility: Secondary | ICD-10-CM | POA: Diagnosis not present

## 2013-02-27 DIAGNOSIS — S72009D Fracture of unspecified part of neck of unspecified femur, subsequent encounter for closed fracture with routine healing: Secondary | ICD-10-CM | POA: Diagnosis not present

## 2013-02-28 DIAGNOSIS — M6281 Muscle weakness (generalized): Secondary | ICD-10-CM | POA: Diagnosis not present

## 2013-02-28 DIAGNOSIS — S72009D Fracture of unspecified part of neck of unspecified femur, subsequent encounter for closed fracture with routine healing: Secondary | ICD-10-CM | POA: Diagnosis not present

## 2013-02-28 DIAGNOSIS — M545 Low back pain: Secondary | ICD-10-CM | POA: Diagnosis not present

## 2013-02-28 DIAGNOSIS — R269 Unspecified abnormalities of gait and mobility: Secondary | ICD-10-CM | POA: Diagnosis not present

## 2013-03-01 DIAGNOSIS — R269 Unspecified abnormalities of gait and mobility: Secondary | ICD-10-CM | POA: Diagnosis not present

## 2013-03-01 DIAGNOSIS — M6281 Muscle weakness (generalized): Secondary | ICD-10-CM | POA: Diagnosis not present

## 2013-03-01 DIAGNOSIS — M545 Low back pain: Secondary | ICD-10-CM | POA: Diagnosis not present

## 2013-03-01 DIAGNOSIS — S72009D Fracture of unspecified part of neck of unspecified femur, subsequent encounter for closed fracture with routine healing: Secondary | ICD-10-CM | POA: Diagnosis not present

## 2013-03-02 DIAGNOSIS — M6281 Muscle weakness (generalized): Secondary | ICD-10-CM | POA: Diagnosis not present

## 2013-03-02 DIAGNOSIS — S72009D Fracture of unspecified part of neck of unspecified femur, subsequent encounter for closed fracture with routine healing: Secondary | ICD-10-CM | POA: Diagnosis not present

## 2013-03-02 DIAGNOSIS — R269 Unspecified abnormalities of gait and mobility: Secondary | ICD-10-CM | POA: Diagnosis not present

## 2013-03-02 DIAGNOSIS — M545 Low back pain: Secondary | ICD-10-CM | POA: Diagnosis not present

## 2013-03-05 DIAGNOSIS — M6281 Muscle weakness (generalized): Secondary | ICD-10-CM | POA: Diagnosis not present

## 2013-03-05 DIAGNOSIS — S72009D Fracture of unspecified part of neck of unspecified femur, subsequent encounter for closed fracture with routine healing: Secondary | ICD-10-CM | POA: Diagnosis not present

## 2013-03-05 DIAGNOSIS — R269 Unspecified abnormalities of gait and mobility: Secondary | ICD-10-CM | POA: Diagnosis not present

## 2013-03-05 DIAGNOSIS — M545 Low back pain: Secondary | ICD-10-CM | POA: Diagnosis not present

## 2013-03-06 DIAGNOSIS — M6281 Muscle weakness (generalized): Secondary | ICD-10-CM | POA: Diagnosis not present

## 2013-03-06 DIAGNOSIS — S72009D Fracture of unspecified part of neck of unspecified femur, subsequent encounter for closed fracture with routine healing: Secondary | ICD-10-CM | POA: Diagnosis not present

## 2013-03-06 DIAGNOSIS — M545 Low back pain: Secondary | ICD-10-CM | POA: Diagnosis not present

## 2013-03-06 DIAGNOSIS — R269 Unspecified abnormalities of gait and mobility: Secondary | ICD-10-CM | POA: Diagnosis not present

## 2013-03-07 DIAGNOSIS — M545 Low back pain: Secondary | ICD-10-CM | POA: Diagnosis not present

## 2013-03-07 DIAGNOSIS — S72009D Fracture of unspecified part of neck of unspecified femur, subsequent encounter for closed fracture with routine healing: Secondary | ICD-10-CM | POA: Diagnosis not present

## 2013-03-07 DIAGNOSIS — R269 Unspecified abnormalities of gait and mobility: Secondary | ICD-10-CM | POA: Diagnosis not present

## 2013-03-07 DIAGNOSIS — M6281 Muscle weakness (generalized): Secondary | ICD-10-CM | POA: Diagnosis not present

## 2013-03-08 DIAGNOSIS — M6281 Muscle weakness (generalized): Secondary | ICD-10-CM | POA: Diagnosis not present

## 2013-03-08 DIAGNOSIS — S72009D Fracture of unspecified part of neck of unspecified femur, subsequent encounter for closed fracture with routine healing: Secondary | ICD-10-CM | POA: Diagnosis not present

## 2013-03-08 DIAGNOSIS — M545 Low back pain: Secondary | ICD-10-CM | POA: Diagnosis not present

## 2013-03-08 DIAGNOSIS — R269 Unspecified abnormalities of gait and mobility: Secondary | ICD-10-CM | POA: Diagnosis not present

## 2013-03-09 DIAGNOSIS — M545 Low back pain: Secondary | ICD-10-CM | POA: Diagnosis not present

## 2013-03-09 DIAGNOSIS — S72009D Fracture of unspecified part of neck of unspecified femur, subsequent encounter for closed fracture with routine healing: Secondary | ICD-10-CM | POA: Diagnosis not present

## 2013-03-09 DIAGNOSIS — R269 Unspecified abnormalities of gait and mobility: Secondary | ICD-10-CM | POA: Diagnosis not present

## 2013-03-09 DIAGNOSIS — M6281 Muscle weakness (generalized): Secondary | ICD-10-CM | POA: Diagnosis not present

## 2013-03-12 DIAGNOSIS — M6281 Muscle weakness (generalized): Secondary | ICD-10-CM | POA: Diagnosis not present

## 2013-03-12 DIAGNOSIS — R269 Unspecified abnormalities of gait and mobility: Secondary | ICD-10-CM | POA: Diagnosis not present

## 2013-03-12 DIAGNOSIS — M545 Low back pain: Secondary | ICD-10-CM | POA: Diagnosis not present

## 2013-03-12 DIAGNOSIS — S72009D Fracture of unspecified part of neck of unspecified femur, subsequent encounter for closed fracture with routine healing: Secondary | ICD-10-CM | POA: Diagnosis not present

## 2013-03-14 DIAGNOSIS — R269 Unspecified abnormalities of gait and mobility: Secondary | ICD-10-CM | POA: Diagnosis not present

## 2013-03-14 DIAGNOSIS — M6281 Muscle weakness (generalized): Secondary | ICD-10-CM | POA: Diagnosis not present

## 2013-03-14 DIAGNOSIS — S72009D Fracture of unspecified part of neck of unspecified femur, subsequent encounter for closed fracture with routine healing: Secondary | ICD-10-CM | POA: Diagnosis not present

## 2013-03-14 DIAGNOSIS — M545 Low back pain: Secondary | ICD-10-CM | POA: Diagnosis not present

## 2013-03-16 ENCOUNTER — Encounter: Payer: Self-pay | Admitting: Family Medicine

## 2013-03-16 ENCOUNTER — Ambulatory Visit (INDEPENDENT_AMBULATORY_CARE_PROVIDER_SITE_OTHER): Payer: Medicare Other | Admitting: Family Medicine

## 2013-03-16 VITALS — BP 110/62 | HR 70 | Wt 185.6 lb

## 2013-03-16 DIAGNOSIS — E119 Type 2 diabetes mellitus without complications: Secondary | ICD-10-CM | POA: Diagnosis not present

## 2013-03-16 DIAGNOSIS — Z79899 Other long term (current) drug therapy: Secondary | ICD-10-CM | POA: Diagnosis not present

## 2013-03-16 DIAGNOSIS — M170 Bilateral primary osteoarthritis of knee: Secondary | ICD-10-CM

## 2013-03-16 DIAGNOSIS — S72009D Fracture of unspecified part of neck of unspecified femur, subsequent encounter for closed fracture with routine healing: Secondary | ICD-10-CM | POA: Diagnosis not present

## 2013-03-16 DIAGNOSIS — M6281 Muscle weakness (generalized): Secondary | ICD-10-CM | POA: Diagnosis not present

## 2013-03-16 DIAGNOSIS — R269 Unspecified abnormalities of gait and mobility: Secondary | ICD-10-CM | POA: Diagnosis not present

## 2013-03-16 DIAGNOSIS — E782 Mixed hyperlipidemia: Secondary | ICD-10-CM

## 2013-03-16 DIAGNOSIS — M545 Low back pain, unspecified: Secondary | ICD-10-CM | POA: Diagnosis not present

## 2013-03-16 LAB — POCT GLYCOSYLATED HEMOGLOBIN (HGB A1C): Hemoglobin A1C: 5.9

## 2013-03-16 MED ORDER — DOXYCYCLINE HYCLATE 100 MG PO TABS: 100.0000 mg | ORAL_TABLET | Freq: Two times a day (BID) | ORAL | Status: DC

## 2013-03-16 NOTE — Progress Notes (Signed)
  Subjective:    Patient ID: Christian Thomas, male    DOB: [birth year], 79 y.o.   MRN: [id number]  HPI Patient arrives office for evaluation. Overall doing reasonably well. Compliant with his medications. No headache or chest pain. Decent appetite. Reports his sugars are doing well. It up some ooze around at the Center. Significant knee pain. Claims compliance with all of his many medications.  Review of Systems Baseline known feet, no TIA symptoms no seizure symptoms. ROS otherwise negative    Objective:   Physical Exam Alert vital signs stable. HEENT normal. Lungs clear. Heart regular rate and rhythm. Feet sensation nearly absent pulses good no lesions.  Results for orders placed in visit on 03/16/13  POCT GLYCOSYLATED HEMOGLOBIN (HGB A1C)      Result Value Range   Hemoglobin A1C 5.9     Procedure note patient was draped prepped and anesthetized both knees were injected with 1 cc Depo-Medrol 2 cc of Xylocaine.     Assessment & Plan:  Impression 1 peripheral neuropathy. Clinically stable #2 type 2 diabetes good control 5.9. #3 hypertension good control. #4 constipation ongoing. #5 prostate hypertrophy stable. #6 CHF stable. #7 arthritis of knees injected today. Plan as per orders. Diet exercise discussed. Recheck her persists. Followup office visit in 3 months. WSL

## 2013-03-21 DIAGNOSIS — M170 Bilateral primary osteoarthritis of knee: Secondary | ICD-10-CM | POA: Insufficient documentation

## 2013-03-30 ENCOUNTER — Other Ambulatory Visit: Payer: Self-pay | Admitting: Family Medicine

## 2013-04-10 ENCOUNTER — Other Ambulatory Visit: Payer: Self-pay | Admitting: Family Medicine

## 2013-04-11 ENCOUNTER — Other Ambulatory Visit: Payer: Self-pay | Admitting: Family Medicine

## 2013-04-11 NOTE — Telephone Encounter (Signed)
Ok, may refill prn

## 2013-04-16 DIAGNOSIS — S72009D Fracture of unspecified part of neck of unspecified femur, subsequent encounter for closed fracture with routine healing: Secondary | ICD-10-CM | POA: Diagnosis not present

## 2013-04-16 DIAGNOSIS — M545 Low back pain: Secondary | ICD-10-CM | POA: Diagnosis not present

## 2013-04-16 DIAGNOSIS — R269 Unspecified abnormalities of gait and mobility: Secondary | ICD-10-CM | POA: Diagnosis not present

## 2013-04-16 DIAGNOSIS — M6281 Muscle weakness (generalized): Secondary | ICD-10-CM | POA: Diagnosis not present

## 2013-04-18 DIAGNOSIS — M545 Low back pain: Secondary | ICD-10-CM | POA: Diagnosis not present

## 2013-04-18 DIAGNOSIS — S72009D Fracture of unspecified part of neck of unspecified femur, subsequent encounter for closed fracture with routine healing: Secondary | ICD-10-CM | POA: Diagnosis not present

## 2013-04-18 DIAGNOSIS — R269 Unspecified abnormalities of gait and mobility: Secondary | ICD-10-CM | POA: Diagnosis not present

## 2013-04-18 DIAGNOSIS — M6281 Muscle weakness (generalized): Secondary | ICD-10-CM | POA: Diagnosis not present

## 2013-04-23 ENCOUNTER — Other Ambulatory Visit: Payer: Self-pay | Admitting: Family Medicine

## 2013-05-07 ENCOUNTER — Other Ambulatory Visit: Payer: Self-pay | Admitting: Family Medicine

## 2013-05-11 ENCOUNTER — Other Ambulatory Visit: Payer: Self-pay | Admitting: Family Medicine

## 2013-05-14 ENCOUNTER — Other Ambulatory Visit: Payer: Self-pay | Admitting: Family Medicine

## 2013-05-15 NOTE — Telephone Encounter (Signed)
Ok with five ref 

## 2013-05-15 NOTE — Telephone Encounter (Signed)
Last office visit 03/16/13. Tramadol last filled on 04/11/13 #30.

## 2013-05-16 ENCOUNTER — Other Ambulatory Visit: Payer: Self-pay | Admitting: Family Medicine

## 2013-05-23 DIAGNOSIS — B351 Tinea unguium: Secondary | ICD-10-CM | POA: Diagnosis not present

## 2013-05-23 DIAGNOSIS — M79609 Pain in unspecified limb: Secondary | ICD-10-CM | POA: Diagnosis not present

## 2013-06-04 ENCOUNTER — Other Ambulatory Visit: Payer: Self-pay | Admitting: Family Medicine

## 2013-06-07 ENCOUNTER — Other Ambulatory Visit: Payer: Self-pay | Admitting: Family Medicine

## 2013-06-11 ENCOUNTER — Other Ambulatory Visit: Payer: Self-pay | Admitting: Family Medicine

## 2013-06-12 DIAGNOSIS — E119 Type 2 diabetes mellitus without complications: Secondary | ICD-10-CM | POA: Diagnosis not present

## 2013-06-12 DIAGNOSIS — E782 Mixed hyperlipidemia: Secondary | ICD-10-CM | POA: Diagnosis not present

## 2013-06-12 DIAGNOSIS — Z79899 Other long term (current) drug therapy: Secondary | ICD-10-CM | POA: Diagnosis not present

## 2013-06-12 LAB — BASIC METABOLIC PANEL
BUN: 24 mg/dL — ABNORMAL HIGH (ref 6–23)
CO2: 28 mEq/L (ref 19–32)
Glucose, Bld: 126 mg/dL — ABNORMAL HIGH (ref 70–99)
Potassium: 4.7 mEq/L (ref 3.5–5.3)

## 2013-06-12 LAB — HEPATIC FUNCTION PANEL
ALT: 9 U/L (ref 0–53)
AST: 12 U/L (ref 0–37)
Bilirubin, Direct: 0.2 mg/dL (ref 0.0–0.3)
Indirect Bilirubin: 0.5 mg/dL (ref 0.0–0.9)
Total Bilirubin: 0.7 mg/dL (ref 0.3–1.2)

## 2013-06-12 LAB — LIPID PANEL: Cholesterol: 132 mg/dL (ref 0–200)

## 2013-06-13 LAB — MICROALBUMIN, URINE: Microalb, Ur: 0.58 mg/dL (ref 0.00–1.89)

## 2013-06-15 ENCOUNTER — Ambulatory Visit (INDEPENDENT_AMBULATORY_CARE_PROVIDER_SITE_OTHER): Payer: Medicare Other | Admitting: Family Medicine

## 2013-06-15 ENCOUNTER — Encounter: Payer: Self-pay | Admitting: Family Medicine

## 2013-06-15 VITALS — BP 106/58 | Wt 186.4 lb

## 2013-06-15 DIAGNOSIS — E119 Type 2 diabetes mellitus without complications: Secondary | ICD-10-CM | POA: Diagnosis not present

## 2013-06-15 DIAGNOSIS — G609 Hereditary and idiopathic neuropathy, unspecified: Secondary | ICD-10-CM | POA: Diagnosis not present

## 2013-06-15 DIAGNOSIS — I10 Essential (primary) hypertension: Secondary | ICD-10-CM | POA: Diagnosis not present

## 2013-06-15 DIAGNOSIS — M170 Bilateral primary osteoarthritis of knee: Secondary | ICD-10-CM

## 2013-06-15 DIAGNOSIS — N400 Enlarged prostate without lower urinary tract symptoms: Secondary | ICD-10-CM | POA: Diagnosis not present

## 2013-06-15 NOTE — Progress Notes (Signed)
  Subjective:    Patient ID: Christian Thomas, male    DOB: December 31, 1914, 79 y.o.   MRN: [id number]  HPI Results for orders placed in visit on 03/16/13  LIPID PANEL      Result Value Range   Cholesterol 132  0 - 200 mg/dL   Triglycerides 409  <811 mg/dL   HDL 49  >91 mg/dL   Total CHOL/HDL Ratio 2.7     VLDL 24  0 - 40 mg/dL   LDL Cholesterol 59  0 - 99 mg/dL  HEPATIC FUNCTION PANEL      Result Value Range   Total Bilirubin 0.7  0.3 - 1.2 mg/dL   Bilirubin, Direct 0.2  0.0 - 0.3 mg/dL   Indirect Bilirubin 0.5  0.0 - 0.9 mg/dL   Alkaline Phosphatase 63  39 - 117 U/L   AST 12  0 - 37 U/L   ALT 9  0 - 53 U/L   Total Protein 5.9 (*) 6.0 - 8.3 g/dL   Albumin 3.9  3.5 - 5.2 g/dL  BASIC METABOLIC PANEL      Result Value Range   Sodium 137  135 - 145 mEq/L   Potassium 4.7  3.5 - 5.3 mEq/L   Chloride 103  96 - 112 mEq/L   CO2 28  19 - 32 mEq/L   Glucose, Bld 126 (*) 70 - 99 mg/dL   BUN 24 (*) 6 - 23 mg/dL   Creat 4.78  2.95 - 6.21 mg/dL   Calcium 9.0  8.4 - 30.8 mg/dL  MICROALBUMIN, URINE      Result Value Range   Microalb, Ur 0.58  0.00 - 1.89 mg/dL  POCT GLYCOSYLATED HEMOGLOBIN (HGB A1C)      Result Value Range   Hemoglobin A1C 5.9     Patient arrives for a protracted discussion regarding his many many concerns. Continues to experience numbness in his feet. This in turn leads to unsteadiness. Though he is trying to walk regularly.  No chest pain or significant shortness of breath. Compliant with his medications.  Slight lightheadedness when standing quickly and results quickly.  Urinating twice per night. Compliant with both Hytrin and post car. Feels it it seems to be likely helping.  Trying to watch his sugar control. Numbers are overall in a good level. Watching his diet in this regard.   Review of Systems No chest pain no abdominal pain no shortness of breath ROS otherwise negative    Objective:   Physical Exam  Alert HEENT normal. Lungs clear. Heart regular in  rhythm. Abdomen soft. Feet no sensation. Arterial pulses decent trace edema. Knees major crepitations evident.  Procedure note patient was draped and prepped. Both knees were anesthetized. Both knees were injected with steroids. 1 cc Depo-Medrol and 2 cc plain Xylocaine    Assessment & Plan:  Impression 1 hypertension good control. #2 hyperlipidemia good control. #3 type 2 diabetes control good. #4 neuropathy discussed. #5 prostate hypertrophy ongoing discussed. #6 progressive arthritis of the knees. Plan maintain all meds. Both knees injected. Local measures discussed. 40 minutes spent most in discussion. WSL

## 2013-06-18 ENCOUNTER — Other Ambulatory Visit: Payer: Self-pay | Admitting: Family Medicine

## 2013-06-19 DIAGNOSIS — N390 Urinary tract infection, site not specified: Secondary | ICD-10-CM | POA: Diagnosis not present

## 2013-06-20 ENCOUNTER — Other Ambulatory Visit: Payer: Self-pay

## 2013-06-25 ENCOUNTER — Other Ambulatory Visit: Payer: Self-pay | Admitting: Family Medicine

## 2013-07-09 ENCOUNTER — Other Ambulatory Visit: Payer: Self-pay | Admitting: Family Medicine

## 2013-07-12 ENCOUNTER — Other Ambulatory Visit: Payer: Self-pay | Admitting: Family Medicine

## 2013-07-13 ENCOUNTER — Other Ambulatory Visit: Payer: Self-pay | Admitting: Family Medicine

## 2013-07-26 DIAGNOSIS — Z23 Encounter for immunization: Secondary | ICD-10-CM | POA: Diagnosis not present

## 2013-07-30 ENCOUNTER — Ambulatory Visit (INDEPENDENT_AMBULATORY_CARE_PROVIDER_SITE_OTHER): Payer: Medicare Other | Admitting: Family Medicine

## 2013-07-30 ENCOUNTER — Encounter: Payer: Self-pay | Admitting: Family Medicine

## 2013-07-30 VITALS — BP 126/60 | Temp 98.4°F | Ht 74.0 in | Wt 183.4 lb

## 2013-07-30 DIAGNOSIS — N410 Acute prostatitis: Secondary | ICD-10-CM

## 2013-07-30 DIAGNOSIS — R32 Unspecified urinary incontinence: Secondary | ICD-10-CM

## 2013-07-30 LAB — POCT URINALYSIS DIPSTICK: Spec Grav, UA: 1.015

## 2013-07-30 MED ORDER — CIPROFLOXACIN HCL 500 MG PO TABS: 500.0000 mg | ORAL_TABLET | Freq: Two times a day (BID) | ORAL | Status: AC

## 2013-07-30 NOTE — Progress Notes (Signed)
  Subjective:    Patient ID: Christian Thomas, male    DOB: [birth year], 79 y.o.   MRN: [id number]  HPI Patient states that he is here today because he has been experiencing urinary incontinence for about 2-3 weeks now  . He states he has no other concerns. Patient has a past history of prostate issues also.  No pain with urinating, smaler stream, odor with it, smells strong  No fever  Review of Systems  in and in no chills no rash no vomiting fair appetite    Objective:   Physical Exam  Alert mild malaise. Vital stable. Lungs clear. Heart regular rate and rhythm. No CVA tenderness. Prostate boggy and tender      Assessment & Plan:  Impression 1 acute prostatitis discussed plan Cipro 500 twice a day 21 days. Symptomatic care discussed. WSL

## 2013-08-08 ENCOUNTER — Other Ambulatory Visit: Payer: Self-pay | Admitting: Family Medicine

## 2013-08-08 NOTE — Telephone Encounter (Signed)
Ok with 5 ref

## 2013-08-08 NOTE — Telephone Encounter (Signed)
Last office visit 07-30-13

## 2013-08-16 ENCOUNTER — Other Ambulatory Visit: Payer: Self-pay | Admitting: Family Medicine

## 2013-08-19 ENCOUNTER — Emergency Department (HOSPITAL_COMMUNITY): Payer: Medicare Other

## 2013-08-19 ENCOUNTER — Emergency Department (HOSPITAL_COMMUNITY)
Admission: EM | Admit: 2013-08-19 | Discharge: 2013-08-19 | Disposition: A | Discharge status: Home / Self Care | Payer: Medicare Other | Attending: Emergency Medicine | Admitting: Emergency Medicine

## 2013-08-19 ENCOUNTER — Encounter (HOSPITAL_COMMUNITY): Payer: Self-pay | Admitting: Emergency Medicine

## 2013-08-19 DIAGNOSIS — Z79899 Other long term (current) drug therapy: Secondary | ICD-10-CM | POA: Diagnosis not present

## 2013-08-19 DIAGNOSIS — Z7902 Long term (current) use of antithrombotics/antiplatelets: Secondary | ICD-10-CM | POA: Diagnosis not present

## 2013-08-19 DIAGNOSIS — Z7982 Long term (current) use of aspirin: Secondary | ICD-10-CM | POA: Insufficient documentation

## 2013-08-19 DIAGNOSIS — Z8619 Personal history of other infectious and parasitic diseases: Secondary | ICD-10-CM | POA: Insufficient documentation

## 2013-08-19 DIAGNOSIS — Z8669 Personal history of other diseases of the nervous system and sense organs: Secondary | ICD-10-CM | POA: Insufficient documentation

## 2013-08-19 DIAGNOSIS — E869 Volume depletion, unspecified: Secondary | ICD-10-CM | POA: Insufficient documentation

## 2013-08-19 DIAGNOSIS — M109 Gout, unspecified: Secondary | ICD-10-CM | POA: Insufficient documentation

## 2013-08-19 DIAGNOSIS — R0602 Shortness of breath: Secondary | ICD-10-CM | POA: Diagnosis not present

## 2013-08-19 DIAGNOSIS — I10 Essential (primary) hypertension: Secondary | ICD-10-CM | POA: Insufficient documentation

## 2013-08-19 DIAGNOSIS — E782 Mixed hyperlipidemia: Secondary | ICD-10-CM | POA: Diagnosis not present

## 2013-08-19 DIAGNOSIS — Z87891 Personal history of nicotine dependence: Secondary | ICD-10-CM | POA: Diagnosis not present

## 2013-08-19 DIAGNOSIS — E1149 Type 2 diabetes mellitus with other diabetic neurological complication: Secondary | ICD-10-CM | POA: Insufficient documentation

## 2013-08-19 DIAGNOSIS — E1142 Type 2 diabetes mellitus with diabetic polyneuropathy: Secondary | ICD-10-CM | POA: Insufficient documentation

## 2013-08-19 DIAGNOSIS — R42 Dizziness and giddiness: Secondary | ICD-10-CM | POA: Diagnosis not present

## 2013-08-19 DIAGNOSIS — R531 Weakness: Secondary | ICD-10-CM

## 2013-08-19 DIAGNOSIS — R404 Transient alteration of awareness: Secondary | ICD-10-CM | POA: Diagnosis not present

## 2013-08-19 DIAGNOSIS — R5381 Other malaise: Secondary | ICD-10-CM | POA: Diagnosis not present

## 2013-08-19 LAB — CBC WITH DIFFERENTIAL/PLATELET
Basophils Absolute: 0 10*3/uL (ref 0.0–0.1)
Basophils Relative: 1 % (ref 0–1)
Eosinophils Absolute: 0.3 10*3/uL (ref 0.0–0.7)
HCT: 39.9 % (ref 39.0–52.0)
MCH: 32.9 pg (ref 26.0–34.0)
MCHC: 33.6 g/dL (ref 30.0–36.0)
Monocytes Absolute: 0.4 10*3/uL (ref 0.1–1.0)
Neutro Abs: 3.9 10*3/uL (ref 1.7–7.7)
RDW: 13.8 % (ref 11.5–15.5)

## 2013-08-19 LAB — URINALYSIS, ROUTINE W REFLEX MICROSCOPIC
Glucose, UA: NEGATIVE mg/dL
Leukocytes, UA: NEGATIVE
Nitrite: NEGATIVE
Specific Gravity, Urine: 1.02 (ref 1.005–1.030)
pH: 6 (ref 5.0–8.0)

## 2013-08-19 LAB — COMPREHENSIVE METABOLIC PANEL
ALT: 10 U/L (ref 0–53)
AST: 17 U/L (ref 0–37)
Albumin: 3.6 g/dL (ref 3.5–5.2)
BUN: 29 mg/dL — ABNORMAL HIGH (ref 6–23)
Calcium: 9.7 mg/dL (ref 8.4–10.5)
Chloride: 100 mEq/L (ref 96–112)
Creatinine, Ser: 1.3 mg/dL (ref 0.50–1.35)
Sodium: 139 mEq/L (ref 135–145)
Total Protein: 6.4 g/dL (ref 6.0–8.3)

## 2013-08-19 LAB — TROPONIN I: Troponin I: <0.3 ng/mL (ref <0.30)

## 2013-08-19 MED ORDER — SODIUM CHLORIDE 0.9 % IV BOLUS (SEPSIS)
500.0000 mL | Freq: Once | INTRAVENOUS | Status: AC
  Administered 2013-08-19: 1000 mL via INTRAVENOUS

## 2013-08-19 MED ORDER — SODIUM CHLORIDE 0.9 % IV BOLUS (SEPSIS)
500.0000 mL | Freq: Once | INTRAVENOUS | Status: AC
  Administered 2013-08-19: 500 mL via INTRAVENOUS

## 2013-08-19 NOTE — ED Provider Notes (Signed)
CSN: [id number]     Arrival date & time 08/19/13  4098 History  This chart was scribed for Lyanne Co, MD by Dorothey Baseman, ED Scribe. This patient was seen in room APA18/AP[facility]18 and the patient's care was started at 9:01 AM.    Chief Complaint  Patient presents with  . Fatigue  . Dizziness   The history is provided by the patient and a relative (Neice). No language interpreter was used.   HPI Comments: Christian Thomas is a 79 y.o. Male brought in by ambulance  who presents to the Emergency Department complaining of fatigue and dizziness with associated shortness of breath, chills, and  diaphoresis onset this morning that lasted approximately an hour and he reports is currently improving. EMS reports that his nurse reported some mild speech difficulty at the scene which has since resolved. His niece reports that he has been ambulatory and otherwise normal before today. He denies any syncope, chest tightness, palpitations, abdominal pain, nausea, emesis, or diarrhea. Patient was started on a course of Ciprofloxacin on 07/30/2013 for 3 weeks for prostatitis.   PCP- Dr. Gerda Diss  Past Medical History  Diagnosis Date  . Diverticulosis   . Mixed hyperlipidemia   . Gout   . Clostridium difficile colitis   . Chronic constipation   . Essential hypertension, benign   . Type 2 diabetes mellitus   . Venous insufficiency   . Benign essential tremor   . Macular degeneration   . Diabetic neuropathy   . Dysrhythmia    Past Surgical History  Procedure Laterality Date  . Hernia repair    . Hemorrhoid surgery    . Appendectomy    . Colonoscopy      NL TI, pan-TICS, sml IH, CDIFF COLITIS  . Sigmoidoscopy      Internal hemorrhoids  . Hip arthroplasty  11/07/2012    Procedure: ARTHROPLASTY BIPOLAR HIP;  Surgeon: Shelda Pal, MD;  Location: [facility];  Service: Orthopedics;  Laterality: Right;   Family History  Problem Relation Age of Onset  . Cancer Mother     Breast   History  Substance Use  Topics  . Smoking status: Former Smoker -- 0.50 packs/day for 54 years    Types: Cigarettes    Quit date: 11/19/1992  . Smokeless tobacco: Never Used  . Alcohol Use: Yes     Comment: Rare    Review of Systems  A complete 10 system review of systems was obtained and all systems are negative except as noted in the HPI and PMH.   Allergies  Morphine and related and Other  Home Medications   Current Outpatient Rx  Name  Route  Sig  Dispense  Refill  . allopurinol (ZYLOPRIM) 300 MG tablet      TAKE 1 TABLET BY MOUTH ONCE DAILY FOR GOUT.   30 tablet   3   . ALPRAZolam (XANAX) 0.25 MG tablet      TAKE 1 TABLET BY MOUTH AT BEDTIME.   30 tablet   5   . aspirin 81 MG chewable tablet   Oral   Chew 1 tablet (81 mg total) by mouth daily.         . clopidogrel (PLAVIX) 75 MG tablet      TAKE 1 TABLET BY MOUTH EVERY MORNING.   30 tablet   3   . diphenhydrAMINE (BENADRYL) 25 MG tablet   Oral   Take 25 mg by mouth every 6 (six) hours as needed for itching.         Marland Kitchen  enalapril (VASOTEC) 2.5 MG tablet      TAKE (1) TABLET BY MOUTH EACH MORNING.   30 tablet   5   . etodolac (LODINE) 300 MG capsule   Oral   Take 300 mg by mouth 2 (two) times daily with a meal.         . ferrous sulfate 325 (65 FE) MG tablet      TAKE 1 TABLET BY MOUTH 3 TIMES DAILY.   90 tablet   1   . finasteride (PROSCAR) 5 MG tablet   Oral   Take 5 mg by mouth daily.         . furosemide (LASIX) 20 MG tablet      TAKE (1) TABLET BY MOUTH EACH MORNING.   30 tablet   5   . glucosamine-chondroitin 500-400 MG tablet      TAKE 2 CAPSULES BY MOUTH DAILY.   60 tablet   1   . lactulose (CHRONULAC) 10 GM/15ML solution   Oral   Take 15 g by mouth daily as needed. constipation         . lovastatin (MEVACOR) 40 MG tablet      TAKE (1) TABLET BY MOUTH AT BEDTIME FOR CHOLESTEROL.   30 tablet   5   . meclizine (ANTIVERT) 25 MG tablet   Oral   Take 25 mg by mouth every 6 (six) hours  as needed. dizziness         . metFORMIN (GLUCOPHAGE) 500 MG tablet      TAKE 1 TABLET BY MOUTH TWICE DAILY.   60 tablet   5   . Multiple Vitamins-Minerals (ICAPS) CAPS      TAKE (2) TABLETS BY MOUTH ONCE DAILY   60 capsule   2   . omeprazole (PRILOSEC) 20 MG capsule      TAKE ONE CAPSULE ORALLY EVERY MORNING.   30 capsule   5   . polyethylene glycol powder (GLYCOLAX/MIRALAX) powder      MIX 1 CAPFUL (17G) IN 8 OUNCES OF JUICE/WATER AND DRINK 3 TIMES DAILY.   1581 g   1   . Q-PAP 500 MG tablet      TAKE 2 TABLETS BY MOUTH TWICE DAILY.   120 tablet   5   . SALINE NASAL SPRAY NA   Nasal   Place into the nose.         . senna (SENOKOT) 8.6 MG TABS   Oral   Take 2 tablets (17.2 mg total) by mouth at bedtime.   120 each      . Specialty Vitamins Products (ICAPS LUTEIN-ZEAXANTHIN PO)   Oral   Take 2 tablets by mouth daily.         Marland Kitchen terazosin (HYTRIN) 5 MG capsule      TAKE (1) CAPSULE BY MOUTH ONCE DAILY.   30 capsule   5   . traMADol (ULTRAM) 50 MG tablet      TAKE 1 TABLET BY MOUTH TWICE DAILY AS NEEDED FOR PAIN.   30 tablet   5    Triage Vitals: BP 149/69  Pulse 72  Temp(Src) 97.6 F (36.4 C) (Oral)  Resp 18  Ht 6\' 2"  (1.88 m)  Wt 180 lb (81.647 kg)  BMI 23.1 kg/m2  SpO2 98%  Physical Exam  Nursing note and vitals reviewed. Constitutional: He is oriented to person, place, and time. He appears well-developed and well-nourished.  HENT:  Head: Normocephalic and atraumatic.  Eyes: Pupils are equal,  round, and reactive to light.  Cardiovascular: Normal rate, regular rhythm and normal heart sounds.   Pulmonary/Chest: Effort normal. No respiratory distress.  Rhonchi at the left lower base. Otherwise normal, no wheezes.   Abdominal: Soft. There is no tenderness.  Musculoskeletal: Normal range of motion.  Full range of motion bilateral hips.   Neurological: He is alert and oriented to person, place, and time.  5/5 strength in major muscle  groups of  bilateral upper and lower extremities. Speech normal. No facial asymetry.     ED Course  Procedures   ECG interpretation  Date: 08/19/2013  Rate: 67  Rhythm: normal sinus rhythm  QRS Axis: normal  Intervals: normal  ST/T Wave abnormalities: nonspecific ST and T wave changes  Conduction Disutrbances: Right bundle branch block  Narrative Interpretation:   Old EKG Reviewed: Nonspecific ST changes as compared to prior EKG dated December 2013   Medications  sodium chloride 0.9 % bolus 500 mL (not administered)  sodium chloride 0.9 % bolus 500 mL (1,000 mLs Intravenous New Bag/Given 08/19/13 0914)    DIAGNOSTIC STUDIES: Oxygen Saturation is 98% on room air, normal by my interpretation.    COORDINATION OF CARE: 9:06AM- Will order a chest x-ray, CT of the head, blood labs, and UA. Discussed treatment plan with patient at bedside and patient verbalized agreement.   11:40AM- Upon recheck, patient reports feeling better. Discussed that lab results were relatively normal, but that the patient may be slightly dehydrated. Discussed that x-ray results were normal. Advised patient to follow up with his PCP in a few days and to return to the ED if there are any new or worsening symptoms. Discussed treatment plan with patient at bedside and patient verbalized agreement.   Labs Review Labs Reviewed  CBC WITH DIFFERENTIAL - Abnormal; Notable for the following:    RBC 4.07 (*)    All other components within normal limits  COMPREHENSIVE METABOLIC PANEL - Abnormal; Notable for the following:    Glucose, Bld 180 (*)    BUN 29 (*)    GFR calc non Af Amer 45 (*)    GFR calc Af Amer 52 (*)    All other components within normal limits  URINALYSIS, ROUTINE W REFLEX MICROSCOPIC - Abnormal; Notable for the following:    Bilirubin Urine MODERATE (*)    All other components within normal limits  TROPONIN I   Imaging Review Dg Chest 2 View  08/19/2013   CLINICAL DATA:  Fatigue and dizziness  with weakness.  EXAM: CHEST  2 VIEW  COMPARISON:  11/05/2012  FINDINGS: Patient slightly rotated to the right. Lungs are adequately inflated without focal consolidation or effusion. Cardiomediastinal silhouette and remainder of the exam is unchanged.  IMPRESSION: No active cardiopulmonary disease.   Electronically Signed   By: Elberta Fortis M.D.   On: 08/19/2013 09:48   Ct Head Wo Contrast  08/19/2013   CLINICAL DATA:  Fatigue and dizziness with transient word-finding difficulties. Weakness.  EXAM: CT HEAD WITHOUT CONTRAST  TECHNIQUE: Contiguous axial images were obtained from the base of the skull through the vertex without intravenous contrast.  COMPARISON:  August 09, 202009  FINDINGS: Ventricles, cisterns and other CSF spaces are within normal. There is a small old lacunar infarct over the right basal ganglia. There is evidence of old bilateral small cerebellar infarcts. There is no mass, mass effect, shift of midline structures or acute hemorrhage. There is no evidence to suggest acute infarction. Remainder of the exam is unremarkable.  IMPRESSION: No  acute intracranial findings.  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 Small old right basal ganglia lacunar infarct.   Electronically Signed   By: Elberta Fortis M.D.   On: 08/19/2013 09:41   I personally reviewed the imaging tests through PACS system I reviewed available ER/hospitalization records through the EMR   MDM   1. Weakness   2. Volume depletion    11:47 AM Patient feels much better after IV fluids.  I suspect this represents mild volume depletion.  His symptoms this morning were transient.  He MB later emerged from about a difficulty.  Labs urine and chest and CT head and without significant abnormality.  Discharge home in good condition.  Return to the ER for new or worsening symptoms.  Close PCP followup.  All questions answered.  I think it is transient issue with words was likely related to brain hypoperfusion which quickly resolved with  lying flat.   I personally performed the services described in this documentation, which was scribed in my presence. The recorded information has been reviewed and is accurate.    Lyanne Co, MD 08/19/13 [phone]

## 2013-08-19 NOTE — ED Notes (Signed)
Patient assisted from the restroom to his room. Tolerated well. Patient states he is feeling a lot better at this time.

## 2013-08-19 NOTE — ED Notes (Signed)
Dr. Patria Mane at bedside speaking with pt and famiy,

## 2013-08-19 NOTE — ED Notes (Signed)
Patient brought in via EMS from [facility]. Alert and oriented. Airway patent. Per EMS patient c/o dizziness, generalized weakness that started this morning while walking to get breakfast. Patient states "felt like my knees were going to give out on me." Per EMS staff at [facility] stated that patient "got clammy, sweaty, and had difficulty finding his words." Denies any chest pain. Patient speech WNL at this time. Per EMS patient blood sugar 236.

## 2013-08-19 NOTE — ED Notes (Signed)
Pt ambulatory to room, tolerated well, denies any complaints,

## 2013-08-19 NOTE — ED Notes (Signed)
Dr Campos at bedside,  

## 2013-08-19 NOTE — ED Notes (Addendum)
Pt discharged home with family, report called to [facility], report given to [place], Charity fundraiser

## 2013-08-20 ENCOUNTER — Encounter: Payer: Self-pay | Admitting: Family Medicine

## 2013-08-20 ENCOUNTER — Ambulatory Visit (INDEPENDENT_AMBULATORY_CARE_PROVIDER_SITE_OTHER): Payer: Medicare Other | Admitting: Family Medicine

## 2013-08-20 VITALS — BP 142/80 | Temp 98.4°F | Ht 74.0 in | Wt 184.0 lb

## 2013-08-20 DIAGNOSIS — G464 Cerebellar stroke syndrome: Secondary | ICD-10-CM

## 2013-08-20 DIAGNOSIS — I10 Essential (primary) hypertension: Secondary | ICD-10-CM | POA: Diagnosis not present

## 2013-08-20 DIAGNOSIS — E119 Type 2 diabetes mellitus without complications: Secondary | ICD-10-CM | POA: Diagnosis not present

## 2013-08-20 DIAGNOSIS — I6789 Other cerebrovascular disease: Secondary | ICD-10-CM

## 2013-08-20 DIAGNOSIS — R42 Dizziness and giddiness: Secondary | ICD-10-CM | POA: Insufficient documentation

## 2013-08-20 NOTE — Progress Notes (Signed)
  Subjective:    Patient ID: Christian Thomas, male    DOB: Feb 23, 1915, 79 y.o.   MRN: [id number]  [facility]IFollow up from Hospital. Pt felt cold and dizzy. Thought pt was dehydrted. Not eating the best. Stays sleepy a lot, Went for dizziness, blurred vision, and slurred speech.   Felt dizzy again, and sweaty this morning  Felt groggy.  Patient went to the emergency room. Had a pretty folate workup at that time. Family was advised they thought he might be slightly dehydrated. However his urine was not particularly concentrated. And he had neural history diminished appetite. Also the symptoms came on relatively quickly. Of note glucose was decent happen. Blood pressure was reportedly normal.  Patient has been expressing low blood pressures intermittently. He is compliant with medications. Next  Claims eating and drinking fairly well.  Workup of note revealed old cerebellar infarction. Scan from 09 revealed the same.  Patient reports feeling blurred vision at times. However he does have macular denser degeneration along with baseline poor vision.    Review of Systems    no chest pain no headache no shortness of breath no abdominal pain no change in bowel habits Objective:   Physical Exam  Alert no apparent distress HEENT normal blood pressure 120/80 lungs clear. Heart regular in rhythm. Abdomen benign. Neuro intact.      Assessment & Plan:  Impression dizzy spells etiology unclear. #2 borderline low blood pressure will maintain same for now. #3 no evidence of hypoglycemia discussed #4 history of stroke discussed #5 entire ER notes reviewed length. Plan 35 minutes spent most in review material's in discussion with patient and family. Recheck in 4 weeks. No change in medicines. Check daily blood pressures and sugars and record. WSL

## 2013-08-23 ENCOUNTER — Other Ambulatory Visit: Payer: Self-pay | Admitting: Family Medicine

## 2013-08-28 ENCOUNTER — Telehealth: Payer: Self-pay | Admitting: Family Medicine

## 2013-08-28 NOTE — Telephone Encounter (Signed)
Always do exactly what they did--ck sugar, blood pressure, and o2sat. Ov next wk to discuss again. Record frequency and what he's doing at the time

## 2013-08-28 NOTE — Telephone Encounter (Signed)
Notified Marcelino Duster at [facility] always do exactly what they did--ck sugar, blood pressure, and o2sat. Ov next wk to discuss again. Record frequency and what he's doing at the time. Marcelino Duster verbalized understanding.

## 2013-08-28 NOTE — Telephone Encounter (Signed)
Marcelino Duster states Mr. Alegria had another spell of being hot, sweaty and dizzy.  States he had a similar episode 2 weeks ago he went to the ER.  02 was 91% BP was 122/58 Glucose was 183  Give [id number] and speak with [place].

## 2013-08-29 ENCOUNTER — Telehealth: Payer: Self-pay | Admitting: Family Medicine

## 2013-08-29 NOTE — Telephone Encounter (Signed)
Transferred [name] to front desk to schedule appointment for tomorrow.

## 2013-08-29 NOTE — Telephone Encounter (Signed)
Ov tom 

## 2013-08-29 NOTE — Telephone Encounter (Signed)
Patient has an appointment for Monday for followup on the spells he has had(in 08/28/13 not)-[name] says that patients BP has gone up consistently though-this morning it was 190/90. Should patient come in sooner that Monday?

## 2013-08-30 ENCOUNTER — Encounter: Payer: Self-pay | Admitting: Family Medicine

## 2013-08-30 ENCOUNTER — Ambulatory Visit (INDEPENDENT_AMBULATORY_CARE_PROVIDER_SITE_OTHER): Payer: Medicare Other | Admitting: Family Medicine

## 2013-08-30 VITALS — BP 110/50 | Ht 74.0 in | Wt 184.0 lb

## 2013-08-30 DIAGNOSIS — I6789 Other cerebrovascular disease: Secondary | ICD-10-CM | POA: Diagnosis not present

## 2013-08-30 DIAGNOSIS — M79609 Pain in unspecified limb: Secondary | ICD-10-CM | POA: Diagnosis not present

## 2013-08-30 DIAGNOSIS — E119 Type 2 diabetes mellitus without complications: Secondary | ICD-10-CM

## 2013-08-30 DIAGNOSIS — R55 Syncope and collapse: Secondary | ICD-10-CM

## 2013-08-30 DIAGNOSIS — B351 Tinea unguium: Secondary | ICD-10-CM | POA: Diagnosis not present

## 2013-08-30 DIAGNOSIS — R079 Chest pain, unspecified: Secondary | ICD-10-CM | POA: Diagnosis not present

## 2013-08-30 DIAGNOSIS — I10 Essential (primary) hypertension: Secondary | ICD-10-CM

## 2013-08-30 DIAGNOSIS — G464 Cerebellar stroke syndrome: Secondary | ICD-10-CM

## 2013-08-30 NOTE — Progress Notes (Signed)
  Subjective:    Patient ID: Christian Thomas, male    DOB: 1915/09/19, 79 y.o.   MRN: [id number]  HPI Patient is here today b/c he was having high BP's.patient returns once again with "spells". See prior notes. Has had to go to the emergency room. Negative cardiac enzymes were negative telemetry negative EKG there. We have monitor glucoses during the spells. Of note the patient becomes cool and clammy, and does experience a sense of shortness of breath.  Review of sugars during this time shows a glucoses have been normal. At times her blood pressure has been up, which is concerned that the nursing Center along with the patient's relatives. However I should note that just last week the concern upon presentation was that the blood pressures are running too low.  On further history the last couple spells have occurred during the spell and immediately after walking in the nursing facility. Also of note patient has a history of severe neuropathy and diabetes, which increases his risk of silent ischemia  Patient also has history of congestive heart failure  He is also feeling fatigue    Review of Systems No chest pain no back pain no abdominal pain states feeling okay today    Objective:   Physical Exam  Alert no acute distress. Lungs clear heart regular in rhythm H&T normal blood pressure repeat 128/78      Assessment & Plan:  Impression "spells" need to be concern for the potential of atypical cardiac ischemia. This is discussed with the family. Plan cardiology consultation. Maintain same blood pressure medicine. 25 minutes spent with family and patient mostly in discussion. WSL

## 2013-09-03 ENCOUNTER — Ambulatory Visit: Payer: Medicare Other | Admitting: Family Medicine

## 2013-09-03 ENCOUNTER — Ambulatory Visit (INDEPENDENT_AMBULATORY_CARE_PROVIDER_SITE_OTHER): Payer: Medicare Other | Admitting: Adult Health

## 2013-09-03 ENCOUNTER — Encounter: Payer: Self-pay | Admitting: Adult Health

## 2013-09-03 VITALS — BP 143/69 | HR 79 | Ht 74.0 in | Wt 184.0 lb

## 2013-09-03 DIAGNOSIS — R42 Dizziness and giddiness: Secondary | ICD-10-CM | POA: Diagnosis not present

## 2013-09-03 DIAGNOSIS — I10 Essential (primary) hypertension: Secondary | ICD-10-CM

## 2013-09-03 DIAGNOSIS — G464 Cerebellar stroke syndrome: Secondary | ICD-10-CM

## 2013-09-03 DIAGNOSIS — I6789 Other cerebrovascular disease: Secondary | ICD-10-CM

## 2013-09-03 MED ORDER — ENALAPRIL MALEATE 5 MG PO TABS: ORAL_TABLET | ORAL | Status: DC

## 2013-09-03 NOTE — Assessment & Plan Note (Signed)
Blood pressure is elevated in the lying position, but with standing dropping 156/77 from 195 and 91. I will increase his Vasotec from 2.5 mg to 5 mg daily. He will return to the office in a couple weeks for evaluation of his blood pressure. His blood pressure can also be taken at [facility] skilled nursing facility. I am going to repeat his echocardiogram for LV function. This will help [place] to manage his medications.  I do not believe his symptoms are related to cardiac etiology at this time. I discussed with the patient the need to from seat with any further testing to include a stress test etc. He and his niece and decided to have medical management only. I am in agreement with this.

## 2013-09-03 NOTE — Patient Instructions (Signed)
Your physician recommends that you schedule a follow-up appointment in: 2 weeks with Dr Diona Browner  Your physician has recommended you make the following change in your medication:  1. STOP LASIX 2. STOP HYTRIN 3. Increase Enalapril to 5 mg daily.   Your physician has requested that you have an echocardiogram. Echocardiography is a painless test that uses sound waves to create images of your heart. It provides your doctor with information about the size and shape of your heart and how well your heart's chambers and valves are working. This procedure takes approximately one hour. There are no restrictions for this procedure.

## 2013-09-03 NOTE — Assessment & Plan Note (Signed)
Plan check orthostatics in the office to evaluate his response to position change. He is definitely orthostatic with a lying blood pressure of 195/91, sitting blood pressure 160/80, and standing 156/77. Heart rate remained stable at 74-72 beats per minute. He did have some complaints of dizziness and lightheadedness with position change.  I reviewed his medications, and have decided to discontinue Lasix, and Hytrin. These deciliter and cause orthostasis and possible symptoms of dizziness that he has been experiencing.

## 2013-09-03 NOTE — Progress Notes (Deleted)
Name: Christian Thomas    DOB: 1915-03-28  Age: 79 y.o.  MR#: [id number]       PCP:  Harlow Asa, MD      Insurance: Payor: MEDICARE / Plan: MEDICARE PART A AND B / Product Type: *No Product type* /   CC:   No chief complaint on file.   VS Filed Vitals:   09/03/13 1535  BP: 143/69  Pulse: 79  Height: 6\' 2"  (1.88 m)  Weight: 184 lb (83.462 kg)    Weights Current Weight  09/03/13 184 lb (83.462 kg)  08/30/13 184 lb (83.462 kg)  08/20/13 184 lb (83.462 kg)    Blood Pressure  BP Readings from Last 3 Encounters:  09/03/13 143/69  08/30/13 110/50  08/20/13 142/80     Admit date:  (Not on file) Last encounter with RMR:  Visit date not found   Allergy Morphine and related and Other  Current Outpatient Prescriptions  Medication Sig Dispense Refill  . allopurinol (ZYLOPRIM) 300 MG tablet TAKE 1 TABLET BY MOUTH ONCE DAILY FOR GOUT.  30 tablet  3  . ALPRAZolam (XANAX) 0.25 MG tablet TAKE 1 TABLET BY MOUTH AT BEDTIME.  30 tablet  5  . aspirin 81 MG chewable tablet Chew 1 tablet (81 mg total) by mouth daily.      . clopidogrel (PLAVIX) 75 MG tablet TAKE 1 TABLET BY MOUTH EVERY MORNING.  30 tablet  3  . diphenhydrAMINE (BENADRYL) 25 MG tablet Take 25 mg by mouth every 6 (six) hours as needed for itching.      . enalapril (VASOTEC) 2.5 MG tablet TAKE (1) TABLET BY MOUTH EACH MORNING.  30 tablet  5  . etodolac (LODINE) 300 MG capsule Take 300 mg by mouth 2 (two) times daily with a meal.      . ferrous sulfate 325 (65 FE) MG tablet TAKE 1 TABLET BY MOUTH 3 TIMES DAILY.  90 tablet  1  . finasteride (PROSCAR) 5 MG tablet Take 5 mg by mouth daily.      . furosemide (LASIX) 20 MG tablet TAKE (1) TABLET BY MOUTH EACH MORNING.  30 tablet  5  . lactulose (CHRONULAC) 10 GM/15ML solution Take 15 g by mouth daily as needed. constipation      . lovastatin (MEVACOR) 40 MG tablet TAKE (1) TABLET BY MOUTH AT BEDTIME FOR CHOLESTEROL.  30 tablet  5  . meclizine (ANTIVERT) 25 MG tablet Take 25 mg by mouth  every 6 (six) hours as needed. dizziness      . metFORMIN (GLUCOPHAGE) 500 MG tablet TAKE 1 TABLET BY MOUTH TWICE DAILY.  60 tablet  5  . Multiple Vitamins-Minerals (ICAPS) CAPS TAKE (2) TABLETS BY MOUTH ONCE DAILY  60 capsule  2  . omeprazole (PRILOSEC) 20 MG capsule TAKE ONE CAPSULE ORALLY EVERY MORNING.  30 capsule  5  . polyethylene glycol powder (GLYCOLAX/MIRALAX) powder MIX 1 CAPFUL (17G) IN 8 OUNCES OF JUICE/WATER AND DRINK 3 TIMES DAILY.  1581 g  1  . Q-PAP 500 MG tablet TAKE 2 TABLETS BY MOUTH TWICE DAILY.  120 tablet  5  . SALINE NASAL SPRAY NA Place into the nose.      . senna (SENOKOT) 8.6 MG TABS Take 2 tablets (17.2 mg total) by mouth at bedtime.  120 each    . Specialty Vitamins Products (ICAPS LUTEIN-ZEAXANTHIN PO) Take 2 tablets by mouth daily.      Marland Kitchen terazosin (HYTRIN) 5 MG capsule TAKE (1) CAPSULE BY MOUTH  ONCE DAILY.  30 capsule  5  . traMADol (ULTRAM) 50 MG tablet TAKE 1 TABLET BY MOUTH TWICE DAILY AS NEEDED FOR PAIN.  30 tablet  5   No current facility-administered medications for this visit.    Discontinued Meds:    Medications Discontinued During This Encounter  Medication Reason  . enalapril (VASOTEC) 2.5 MG tablet Error  . etodolac (LODINE) 300 MG capsule Error  . finasteride (PROSCAR) 5 MG tablet Error  . glucosamine-chondroitin 500-400 MG tablet Error    Patient Active Problem List   Diagnosis Date Noted  . Dizziness and giddiness 08/20/2013  . Cerebellar stroke syndrome 08/20/2013  . Unspecified hereditary and idiopathic peripheral neuropathy 06/15/2013  . Prostate hypertrophy 06/15/2013  . Arthritis of both knees 03/21/2013  . DM2 (diabetes mellitus, type 2) 11/05/2012  . Femoral neck fracture 11/05/2012  . Constipation 11/05/2012  . Syncope 07/07/2011  . Abnormal ECG 07/07/2011  . Essential hypertension, benign 07/07/2011    LABS    Component Value Date/Time   NA 139 08/19/2013 0900   NA 137 06/12/2013 0720   NA 136 11/09/2012 0410   K 4.8  08/19/2013 0900   K 4.7 06/12/2013 0720   K 4.1 11/09/2012 0410   CL 100 08/19/2013 0900   CL 103 06/12/2013 0720   CL 105 11/09/2012 0410   CO2 26 08/19/2013 0900   CO2 28 06/12/2013 0720   CO2 24 11/09/2012 0410   GLUCOSE 180* 08/19/2013 0900   GLUCOSE 126* 06/12/2013 0720   GLUCOSE 137* 11/09/2012 0410   BUN 29* 08/19/2013 0900   BUN 24* 06/12/2013 0720   BUN 26* 11/09/2012 0410   CREATININE 1.30 08/19/2013 0900   CREATININE 1.32 06/12/2013 0720   CREATININE 1.24 11/09/2012 0410   CREATININE 1.09 11/08/2012 0456   CALCIUM 9.7 08/19/2013 0900   CALCIUM 9.0 06/12/2013 0720   CALCIUM 8.3* 11/09/2012 0410   GFRNONAA 45* 08/19/2013 0900   GFRNONAA 48* 11/09/2012 0410   GFRNONAA 56* 11/08/2012 0456   GFRAA 52* 08/19/2013 0900   GFRAA 55* 11/09/2012 0410   GFRAA 64* 11/08/2012 0456   CMP     Component Value Date/Time   NA 139 08/19/2013 0900   K 4.8 08/19/2013 0900   CL 100 08/19/2013 0900   CO2 26 08/19/2013 0900   GLUCOSE 180* 08/19/2013 0900   BUN 29* 08/19/2013 0900   CREATININE 1.30 08/19/2013 0900   CREATININE 1.32 06/12/2013 0720   CALCIUM 9.7 08/19/2013 0900   PROT 6.4 08/19/2013 0900   ALBUMIN 3.6 08/19/2013 0900   AST 17 08/19/2013 0900   ALT 10 08/19/2013 0900   ALKPHOS 68 08/19/2013 0900   BILITOT 0.5 08/19/2013 0900   GFRNONAA 45* 08/19/2013 0900   GFRAA 52* 08/19/2013 0900       Component Value Date/Time   WBC 7.0 08/19/2013 0900   WBC 6.9 11/09/2012 0410   WBC 7.7 11/08/2012 0456   HGB 13.4 08/19/2013 0900   HGB 9.5* 11/09/2012 0410   HGB 10.6* 11/08/2012 0456   HCT 39.9 08/19/2013 0900   HCT 28.6* 11/09/2012 0410   HCT 31.1* 11/08/2012 0456   MCV 98.0 08/19/2013 0900   MCV 97.3 11/09/2012 0410   MCV 96.9 11/08/2012 0456    Lipid Panel     Component Value Date/Time   CHOL 132 06/12/2013 0720   TRIG 120 06/12/2013 0720   HDL 49 06/12/2013 0720   CHOLHDL 2.7 06/12/2013 0720   VLDL 24 06/12/2013 0720   LDLCALC 59  06/12/2013 0720    ABG No results found for this basename:  phart, pco2, pco2art, po2, po2art, hco3, tco2, acidbasedef, o2sat     No results found for this basename: TSH   BNP (last 3 results) No results found for this basename: PROBNP,  in the last 8760 hours Cardiac Panel (last 3 results) No results found for this basename: CKTOTAL, CKMB, TROPONINI, RELINDX,  in the last 72 hours  Iron/TIBC/Ferritin No results found for this basename: iron, tibc, ferritin     EKG Orders placed during the hospital encounter of 08/19/13  . EKG 12-LEAD  . EKG 12-LEAD  . EKG 12-LEAD  . EKG 12-LEAD  . EKG     Prior Assessment and Plan Problem List as of 09/03/2013     Cardiovascular and Mediastinum   Syncope   Last Assessment & Plan   11/01/2011 Office Visit Written 11/01/2011  8:48 AM by Jonelle Sidle, MD     No recurrence. Keep off rate lowering medications and continue observation.    Essential hypertension, benign   Last Assessment & Plan   08/12/2011 Office Visit Written 08/12/2011 11:47 AM by Jonelle Sidle, MD     No changes to remaining medical therapy.    Cerebellar stroke syndrome     Digestive   Constipation     Endocrine   DM2 (diabetes mellitus, type 2)     Nervous and Auditory   Unspecified hereditary and idiopathic peripheral neuropathy     Musculoskeletal and Integument   Femoral neck fracture   Arthritis of both knees     Genitourinary   Prostate hypertrophy     Other   Abnormal ECG   Last Assessment & Plan   11/01/2011 Office Visit Written 11/01/2011  8:49 AM by Jonelle Sidle, MD     Conduction system disease with bradycardia - heart rate better now.    Dizziness and giddiness       Imaging: Dg Chest 2 View  08/19/2013   CLINICAL DATA:  Fatigue and dizziness with weakness.  EXAM: CHEST  2 VIEW  COMPARISON:  11/05/2012  FINDINGS: Patient slightly rotated to the right. Lungs are adequately inflated without focal consolidation or effusion. Cardiomediastinal silhouette and remainder of the exam is  unchanged.  IMPRESSION: No active cardiopulmonary disease.   Electronically Signed   By: Elberta Fortis M.D.   On: 08/19/2013 09:48   Ct Head Wo Contrast  08/19/2013   CLINICAL DATA:  Fatigue and dizziness with transient word-finding difficulties. Weakness.  EXAM: CT HEAD WITHOUT CONTRAST  TECHNIQUE: Contiguous axial images were obtained from the base of the skull through the vertex without intravenous contrast.  COMPARISON:  12/28/2007  FINDINGS: Ventricles, cisterns and other CSF spaces are within normal. There is a small old lacunar infarct over the right basal ganglia. There is evidence of old bilateral small cerebellar infarcts. There is no mass, mass effect, shift of midline structures or acute hemorrhage. There is no evidence to suggest acute infarction. Remainder of the exam is unremarkable.  IMPRESSION: No acute intracranial findings.  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 Small old right basal ganglia lacunar infarct.   Electronically Signed   By: Elberta Fortis M.D.   On: 08/19/2013 09:41

## 2013-09-03 NOTE — Assessment & Plan Note (Signed)
He remains on Plavix 75 mg daily. Benefit of this will need to be discussed with the patient by Dr. Diona Browner on followup visit.

## 2013-09-03 NOTE — Progress Notes (Signed)
HPI: Christian Thomas is a delightful 79 y/o patient of Dr. Diona Browner we are seeing at the request of Dr. Gerda Diss due to patients complaint of dizziness and near syncope.  He has fallen as well, one year ago fx his left hip. He is experiencing "spells" where he has high BP and blurred vision with dizziness and weakness. He states that these symptoms occur randomly, and are sometimes associated with position change, but not always. He was recently seen in the emergency room one week ago, with elevated blood pressure and blurred vision. He was advised he was mildly dehydrated although his blood pressure was elevated. He followed up with Dr. Gerda Diss, who felt that his symptoms may be cardiogenic. The patient denies chest pain, or dyspnea on exertion. He is much younger than his stated age concerning his activity. In telemetry he broke his hip, he was walking a minimum of a mile a day. He is now not walking is much to do to his hip and bilateral knee arthritis. He remains active as he can be at a skilled nursing facility, [facility].  Allergies  Allergen Reactions  . Morphine And Related   . Other     Opioids     Current Outpatient Prescriptions  Medication Sig Dispense Refill  . allopurinol (ZYLOPRIM) 300 MG tablet TAKE 1 TABLET BY MOUTH ONCE DAILY FOR GOUT.  30 tablet  3  . ALPRAZolam (XANAX) 0.25 MG tablet TAKE 1 TABLET BY MOUTH AT BEDTIME.  30 tablet  5  . aspirin 81 MG chewable tablet Chew 1 tablet (81 mg total) by mouth daily.      . clopidogrel (PLAVIX) 75 MG tablet TAKE 1 TABLET BY MOUTH EVERY MORNING.  30 tablet  3  . diphenhydrAMINE (BENADRYL) 25 MG tablet Take 25 mg by mouth every 6 (six) hours as needed for itching.      . enalapril (VASOTEC) 2.5 MG tablet TAKE (1) TABLET BY MOUTH EACH MORNING.  30 tablet  5  . etodolac (LODINE) 300 MG capsule Take 300 mg by mouth 2 (two) times daily with a meal.      . ferrous sulfate 325 (65 FE) MG tablet TAKE 1 TABLET BY MOUTH 3 TIMES DAILY.  90 tablet  1    . finasteride (PROSCAR) 5 MG tablet Take 5 mg by mouth daily.      . furosemide (LASIX) 20 MG tablet TAKE (1) TABLET BY MOUTH EACH MORNING.  30 tablet  5  . lactulose (CHRONULAC) 10 GM/15ML solution Take 15 g by mouth daily as needed. constipation      . lovastatin (MEVACOR) 40 MG tablet TAKE (1) TABLET BY MOUTH AT BEDTIME FOR CHOLESTEROL.  30 tablet  5  . meclizine (ANTIVERT) 25 MG tablet Take 25 mg by mouth every 6 (six) hours as needed. dizziness      . metFORMIN (GLUCOPHAGE) 500 MG tablet TAKE 1 TABLET BY MOUTH TWICE DAILY.  60 tablet  5  . Multiple Vitamins-Minerals (ICAPS) CAPS TAKE (2) TABLETS BY MOUTH ONCE DAILY  60 capsule  2  . omeprazole (PRILOSEC) 20 MG capsule TAKE ONE CAPSULE ORALLY EVERY MORNING.  30 capsule  5  . polyethylene glycol powder (GLYCOLAX/MIRALAX) powder MIX 1 CAPFUL (17G) IN 8 OUNCES OF JUICE/WATER AND DRINK 3 TIMES DAILY.  1581 g  1  . Q-PAP 500 MG tablet TAKE 2 TABLETS BY MOUTH TWICE DAILY.  120 tablet  5  . SALINE NASAL SPRAY NA Place into the nose.      Marland Kitchen  senna (SENOKOT) 8.6 MG TABS Take 2 tablets (17.2 mg total) by mouth at bedtime.  120 each    . Specialty Vitamins Products (ICAPS LUTEIN-ZEAXANTHIN PO) Take 2 tablets by mouth daily.      Marland Kitchen terazosin (HYTRIN) 5 MG capsule TAKE (1) CAPSULE BY MOUTH ONCE DAILY.  30 capsule  5  . traMADol (ULTRAM) 50 MG tablet TAKE 1 TABLET BY MOUTH TWICE DAILY AS NEEDED FOR PAIN.  30 tablet  5   No current facility-administered medications for this visit.    Past Medical History  Diagnosis Date  . Diverticulosis   . Mixed hyperlipidemia   . Gout   . Clostridium difficile colitis   . Chronic constipation   . Essential hypertension, benign   . Type 2 diabetes mellitus   . Venous insufficiency   . Benign essential tremor   . Macular degeneration   . Diabetic neuropathy   . Dysrhythmia     Past Surgical History  Procedure Laterality Date  . Hernia repair    . Hemorrhoid surgery    . Appendectomy    . Colonoscopy       NL TI, pan-TICS, sml IH, CDIFF COLITIS  . Sigmoidoscopy      Internal hemorrhoids  . Hip arthroplasty  11/07/2012    Procedure: ARTHROPLASTY BIPOLAR HIP;  Surgeon: Shelda Pal, MD;  Location: [facility];  Service: Orthopedics;  Laterality: Right;    ZOX:WRUEAV of systems complete and found to be negative unless listed above  PHYSICAL EXAM BP 143/69  Pulse 79  Ht 6\' 2"  (1.88 m)  Wt 184 lb (83.462 kg)  BMI 23.61 kg/m2  General: Well developed, well nourished, in no acute distress Head: Eyes PERRLA, No xanthomas.   Normal cephalic and atramatic  Lungs: Clear bilaterally to auscultation and percussion. Heart: HRRR S1 S2, with soft systolic murmur, pulses are 2+ & equal.            No carotid bruit. No JVD.  No abdominal bruits. No femoral bruits. Abdomen: Bowel sounds are positive, abdomen soft and non-tender without masses or                  Hernia's noted. Msk:  Back normal, normal gait. Normal strength and tone for age. Extremities: No clubbing, cyanosis or edema.  DP +1 Neuro: Alert and oriented X 3. Psych:  Good affect, responds appropriately    ASSESSMENT AND PLAN

## 2013-09-11 ENCOUNTER — Ambulatory Visit (HOSPITAL_COMMUNITY)
Admission: RE | Admit: 2013-09-11 | Discharge: 2013-09-11 | Disposition: A | Discharge status: Home / Self Care | Payer: Medicare Other | Source: Ambulatory Visit | Attending: Cardiology | Admitting: Cardiology

## 2013-09-11 DIAGNOSIS — I369 Nonrheumatic tricuspid valve disorder, unspecified: Secondary | ICD-10-CM | POA: Diagnosis not present

## 2013-09-11 DIAGNOSIS — E785 Hyperlipidemia, unspecified: Secondary | ICD-10-CM | POA: Diagnosis not present

## 2013-09-11 DIAGNOSIS — R42 Dizziness and giddiness: Secondary | ICD-10-CM | POA: Diagnosis not present

## 2013-09-11 DIAGNOSIS — I10 Essential (primary) hypertension: Secondary | ICD-10-CM | POA: Diagnosis not present

## 2013-09-11 DIAGNOSIS — E119 Type 2 diabetes mellitus without complications: Secondary | ICD-10-CM | POA: Insufficient documentation

## 2013-09-11 DIAGNOSIS — Z8673 Personal history of transient ischemic attack (TIA), and cerebral infarction without residual deficits: Secondary | ICD-10-CM | POA: Diagnosis not present

## 2013-09-11 NOTE — Progress Notes (Signed)
*  PRELIMINARY RESULTS* Echocardiogram 2D Echocardiogram has been performed.  Yvan Dority 09/11/2013, 12:55 PM

## 2013-09-15 ENCOUNTER — Encounter: Payer: Self-pay | Admitting: *Deleted

## 2013-09-17 ENCOUNTER — Ambulatory Visit (INDEPENDENT_AMBULATORY_CARE_PROVIDER_SITE_OTHER): Payer: Medicare Other | Admitting: Family Medicine

## 2013-09-17 ENCOUNTER — Encounter: Payer: Self-pay | Admitting: Family Medicine

## 2013-09-17 VITALS — BP 158/90 | Ht 74.0 in | Wt 182.4 lb

## 2013-09-17 DIAGNOSIS — N400 Enlarged prostate without lower urinary tract symptoms: Secondary | ICD-10-CM | POA: Diagnosis not present

## 2013-09-17 DIAGNOSIS — E119 Type 2 diabetes mellitus without complications: Secondary | ICD-10-CM

## 2013-09-17 DIAGNOSIS — I10 Essential (primary) hypertension: Secondary | ICD-10-CM | POA: Diagnosis not present

## 2013-09-17 DIAGNOSIS — M170 Bilateral primary osteoarthritis of knee: Secondary | ICD-10-CM

## 2013-09-17 LAB — POCT GLYCOSYLATED HEMOGLOBIN (HGB A1C): Hemoglobin A1C: 5.5

## 2013-09-17 NOTE — Progress Notes (Signed)
  Subjective:    Patient ID: Christian Thomas, male    DOB: 1915/01/05, 79 y.o.   MRN: [id number]  HPI Patient is here today for 3 month check up.   Blood sugars have been stable. Trying to watch sugars in the diet. Walking some. Sugars good numbers overall near 100. No low sugar spells  Saw a heart doctor. They adjusted his medication. Lasix was stopped. Hydration was back down. Vasotec was increased. Thus far no further lightheaded spells.  Patient reports up about the same times per night. No change in urination despite change in Lasix and Hytrin.  Ongoing severe knee pain. Time for injections of knees.  Still having high BP's.  Results for orders placed in visit on 09/17/13  POCT GLYCOSYLATED HEMOGLOBIN (HGB A1C)      Result Value Range   Hemoglobin A1C 5.5        Review of Systems No headache no chest pain no abdominal pain no change in bowel habits no blood in stools no weight loss weight gain ROS otherwise negative    Objective:   Physical Exam  Alert HEENT normal. Lungs clear. Heart regular rate and rhythm. Blood pressure good on repeat 136/82  Procedure note patient was prepped draped anesthetized both knees were injected with 1 cc of steroids specifically Depo-Medrol and 2 cc of regular Xylocaine in sterile fashion.    Assessment & Plan:  Impression 1 type 2 diabetes good control. #2 hypertension controlled improving #3 dizzy spells so far 9 #4 tremor ongoing. #5 severe knee arthritis with injections today both knees plan followup 3 month checkup. Followup with heart doctor as scheduled. Continue same blood pressure meds. Continue same diabetes meds. WSL

## 2013-09-19 ENCOUNTER — Other Ambulatory Visit: Payer: Self-pay | Admitting: Family Medicine

## 2013-09-20 ENCOUNTER — Other Ambulatory Visit: Payer: Self-pay

## 2013-09-25 ENCOUNTER — Other Ambulatory Visit: Payer: Self-pay | Admitting: Family Medicine

## 2013-09-26 ENCOUNTER — Encounter: Payer: Self-pay | Admitting: Cardiology

## 2013-09-26 ENCOUNTER — Ambulatory Visit (INDEPENDENT_AMBULATORY_CARE_PROVIDER_SITE_OTHER): Payer: Medicare Other | Admitting: Cardiology

## 2013-09-26 VITALS — BP 140/88 | HR 78 | Ht 74.0 in | Wt 181.0 lb

## 2013-09-26 DIAGNOSIS — R42 Dizziness and giddiness: Secondary | ICD-10-CM

## 2013-09-26 DIAGNOSIS — R9431 Abnormal electrocardiogram [ECG] [EKG]: Secondary | ICD-10-CM

## 2013-09-26 DIAGNOSIS — I10 Essential (primary) hypertension: Secondary | ICD-10-CM | POA: Diagnosis not present

## 2013-09-26 MED ORDER — ENALAPRIL MALEATE 5 MG PO TABS: 5.0000 mg | ORAL_TABLET | Freq: Two times a day (BID) | ORAL | Status: DC

## 2013-09-26 NOTE — Assessment & Plan Note (Signed)
Increase Vasotec to 5 mg twice daily for now. Continue blood pressure checks at nursing facility.

## 2013-09-26 NOTE — Assessment & Plan Note (Signed)
Patient with known conduction system disease based on ECG. We are avoiding AV nodal blocking drugs.

## 2013-09-26 NOTE — Addendum Note (Signed)
Addended by: Marlyn Corporal A on: 09/26/2013 11:16 AM   Modules accepted: Orders

## 2013-09-26 NOTE — Progress Notes (Signed)
Clinical Summary Christian Thomas is a 79 y.o.male was recently seen in the office by Ms. Lawrence NP in late October. He was seen at that time with intermittent dizziness and near-syncope, had been evaluated for this in the past. He has not evidence of conduction system disease by ECG, had been taken off beta blocker previously. At the most recent visit, he did have some orthostatic drop in blood pressure, although was never hypotensive and had no significant heart rate increase. Medications were adjusted including discontinuation of Lasix and Hytrin, concurrent increase in Vasotec.  Echocardiogram done recently showed moderate LVH with LVEF 55-60%, grade 1 diastolic dysfunction with increased filling pressures, moderate left atrial enlargement, mildly thickened aortic valve with trivial aortic regurgitation, MAC with trivial mitral regurgitation, mild tricuspid regurgitation with PASD 40 mm mercury, mild right atrial enlargement.  He is here with family member today. I reviewed his nursing home blood pressure checks, all hypertensive, some significant in 200/100 range. He states that his dizziness has improved. We reviewed his echocardiogram results and medications.   Allergies  Allergen Reactions  . Morphine And Related   . Other     Opioids     Current Outpatient Prescriptions  Medication Sig Dispense Refill  . allopurinol (ZYLOPRIM) 300 MG tablet TAKE 1 TABLET BY MOUTH ONCE DAILY FOR GOUT.  30 tablet  3  . aspirin 81 MG chewable tablet Chew 1 tablet (81 mg total) by mouth daily.      . clopidogrel (PLAVIX) 75 MG tablet TAKE 1 TABLET BY MOUTH EVERY MORNING.  30 tablet  3  . diphenhydrAMINE (BENADRYL) 25 MG tablet Take 25 mg by mouth every 6 (six) hours as needed for itching.      . enalapril (VASOTEC) 5 MG tablet TAKE (1) TABLET BY MOUTH EACH MORNING.  30 tablet  5  . etodolac (LODINE) 300 MG capsule Take 300 mg by mouth 2 (two) times daily with a meal.      . ferrous sulfate 325 (65 FE)  MG tablet TAKE 1 TABLET BY MOUTH 3 TIMES DAILY.  90 tablet  5  . finasteride (PROSCAR) 5 MG tablet Take 5 mg by mouth daily.      Marland Kitchen lactulose (CHRONULAC) 10 GM/15ML solution Take 15 g by mouth daily as needed. constipation      . loratadine (CLARITIN) 10 MG tablet TAKE 1 TABLET BY MOUTH ONCE DAILY FOR ALLERGIC RHINITIS.  30 tablet  5  . lovastatin (MEVACOR) 40 MG tablet TAKE (1) TABLET BY MOUTH AT BEDTIME FOR CHOLESTEROL.  30 tablet  5  . meclizine (ANTIVERT) 25 MG tablet Take 25 mg by mouth every 6 (six) hours as needed. dizziness      . metFORMIN (GLUCOPHAGE) 500 MG tablet TAKE 1 TABLET BY MOUTH TWICE DAILY.  60 tablet  2  . Multiple Vitamins-Minerals (ICAPS) CAPS TAKE (2) TABLETS BY MOUTH ONCE DAILY  60 capsule  2  . omeprazole (PRILOSEC) 20 MG capsule TAKE ONE CAPSULE ORALLY EVERY MORNING.  30 capsule  5  . polyethylene glycol powder (GLYCOLAX/MIRALAX) powder MIX 1 CAPFUL (17G) IN 8 OUNCES OF JUICE/WATER AND DRINK 3 TIMES DAILY.  1581 g  1  . Q-PAP 500 MG tablet TAKE 2 TABLETS BY MOUTH TWICE DAILY.  120 tablet  5  . SALINE NASAL SPRAY NA Place into the nose.      . senna (SENOKOT) 8.6 MG TABS Take 2 tablets (17.2 mg total) by mouth at bedtime.  120 each    .  Specialty Vitamins Products (ICAPS LUTEIN-ZEAXANTHIN PO) Take 2 tablets by mouth daily.      . traMADol (ULTRAM) 50 MG tablet TAKE 1 TABLET BY MOUTH TWICE DAILY AS NEEDED FOR PAIN.  30 tablet  5   No current facility-administered medications for this visit.    Past Medical History  Diagnosis Date  . Diverticulosis   . Mixed hyperlipidemia   . Gout   . Clostridium difficile colitis   . Chronic constipation   . Essential hypertension, benign   . Type 2 diabetes mellitus   . Venous insufficiency   . Benign essential tremor   . Macular degeneration   . Diabetic neuropathy   . Dysrhythmia     Social History Christian Thomas reports that he quit smoking about 20 years ago. His smoking use included Cigarettes. He has a 27 pack-year  smoking history. He has never used smokeless tobacco. Christian Thomas reports that he drinks alcohol.  Review of Systems No sudden syncope, no palpitations, occasional chest pain symptoms, sporadic overall. Good appetite. Otherwise negative.  Physical Examination Filed Vitals:   09/26/13 1056  BP: 140/88  Pulse: 78   Filed Weights   09/26/13 1056  Weight: 181 lb (82.101 kg)    Elderly male in no acute distress.  HEENT: Conjunctiva and lids are normal, hearing aids in place, oropharynx with moist mucosa.  Neck: Supple, no elevated JVP or carotid bruits, no thyromegaly.  Lungs: Clear to auscultation, nonlabored.  Cardiac: Regular rate and rhythm with 2/6 systolic murmur at the base, preserved second heart sound, no rub or gallop.  Abdomen: Soft, nontender, bowel sounds present.  Skin: Warm and dry.  Musculoskeletal: Kyphosis is noted.  Neuropsychiatric: Alert and oriented x3, hard of hearing, affect appropriate.   Problem List and Plan   Dizziness and giddiness Improved.  Essential hypertension, benign Increase Vasotec to 5 mg twice daily for now. Continue blood pressure checks at nursing facility.  Abnormal ECG Patient with known conduction system disease based on ECG. We are avoiding AV nodal blocking drugs.    Jonelle Sidle, M.D., F.A.C.C.

## 2013-09-26 NOTE — Assessment & Plan Note (Signed)
Improved

## 2013-09-26 NOTE — Patient Instructions (Signed)
Your physician has recommended you make the following change in your medication:    INCREASE Vasotec to 5 mg twice a day   Your physician recommends that you schedule a follow-up appointment in: 6 months

## 2013-10-01 ENCOUNTER — Other Ambulatory Visit: Payer: Self-pay | Admitting: Family Medicine

## 2013-10-04 ENCOUNTER — Other Ambulatory Visit: Payer: Self-pay | Admitting: Family Medicine

## 2013-10-08 ENCOUNTER — Telehealth: Payer: Self-pay | Admitting: Family Medicine

## 2013-10-08 MED ORDER — ALPRAZOLAM 0.25 MG PO TABS: 0.2500 mg | ORAL_TABLET | Freq: Two times a day (BID) | ORAL | Status: DC | PRN

## 2013-10-08 NOTE — Telephone Encounter (Signed)
Rx printed and faxed to [facility] for Xanax .25 BID PRN

## 2013-10-08 NOTE — Telephone Encounter (Signed)
Ok start back xanax prn at prioor dose

## 2013-10-08 NOTE — Telephone Encounter (Signed)
Patients xanax has been discontinued for some reason at [facility] and patients daughter is requesting we send an Rx for this out to [facility]

## 2013-10-14 DIAGNOSIS — N390 Urinary tract infection, site not specified: Secondary | ICD-10-CM | POA: Diagnosis not present

## 2013-10-22 ENCOUNTER — Telehealth: Payer: Self-pay | Admitting: *Deleted

## 2013-10-22 NOTE — Telephone Encounter (Signed)
[facility], and they stated Mr. Girardin no longer has a sore throat.

## 2013-10-31 ENCOUNTER — Other Ambulatory Visit: Payer: Self-pay | Admitting: Family Medicine

## 2013-11-02 ENCOUNTER — Other Ambulatory Visit: Payer: Self-pay | Admitting: Family Medicine

## 2013-11-05 ENCOUNTER — Encounter: Payer: Self-pay | Admitting: Gastroenterology

## 2013-11-05 ENCOUNTER — Other Ambulatory Visit: Payer: Self-pay | Admitting: Family Medicine

## 2013-11-07 ENCOUNTER — Other Ambulatory Visit: Payer: Self-pay | Admitting: Family Medicine

## 2013-11-09 ENCOUNTER — Other Ambulatory Visit: Payer: Self-pay | Admitting: Family Medicine

## 2013-11-12 ENCOUNTER — Other Ambulatory Visit: Payer: Self-pay | Admitting: Family Medicine

## 2013-11-16 ENCOUNTER — Other Ambulatory Visit: Payer: Self-pay | Admitting: Family Medicine

## 2013-11-19 ENCOUNTER — Other Ambulatory Visit: Payer: Self-pay | Admitting: *Deleted

## 2013-11-19 MED ORDER — AMOXICILLIN 500 MG PO CAPS: 500.0000 mg | ORAL_CAPSULE | Freq: Three times a day (TID) | ORAL | Status: DC

## 2013-11-22 ENCOUNTER — Encounter: Payer: Self-pay | Admitting: Family Medicine

## 2013-11-22 ENCOUNTER — Other Ambulatory Visit: Payer: Self-pay | Admitting: Family Medicine

## 2013-11-22 ENCOUNTER — Ambulatory Visit (INDEPENDENT_AMBULATORY_CARE_PROVIDER_SITE_OTHER): Payer: Medicare Other | Admitting: Family Medicine

## 2013-11-22 VITALS — BP 102/70 | Ht 74.0 in | Wt 180.5 lb

## 2013-11-22 DIAGNOSIS — G609 Hereditary and idiopathic neuropathy, unspecified: Secondary | ICD-10-CM | POA: Diagnosis not present

## 2013-11-22 NOTE — Progress Notes (Signed)
   Subjective:    Patient ID: Christian Thomas, male    DOB: 1915-08-21, 80 y.o.   MRN: [id number]  HPI Patient is here today because he states that he feet keeps burning and hurting. This has been present for 2-3 weeks now. It is affecting him sleeping at night also.   Worse in the bottom part of his feet.  The tramadol twice a day as ordered patient is rarely taking. The burning is now superimposed upon his chronic numbness. The burning Review of Systems    no chest pain or back pain no abdominal pain no change in bowel habits ROS otherwise negative Objective:   Physical Exam Alert no apparent distress. Lungs clear. Heart regular rate rhythm. Feet no significant edema pulses diminished but present. Absence of sensation below the ankle.       Assessment & Plan:  Impression painful neuropathy discussed plan add tramadol tablet every evening. Ceasing cream twice a day to affected area. Rationale discussed. followup as scheduled.

## 2013-11-28 ENCOUNTER — Other Ambulatory Visit: Payer: Self-pay | Admitting: Family Medicine

## 2013-12-14 ENCOUNTER — Emergency Department (HOSPITAL_COMMUNITY): Payer: Medicare Other

## 2013-12-14 ENCOUNTER — Ambulatory Visit (INDEPENDENT_AMBULATORY_CARE_PROVIDER_SITE_OTHER): Payer: Medicare Other | Admitting: Family Medicine

## 2013-12-14 ENCOUNTER — Encounter (HOSPITAL_COMMUNITY): Payer: Self-pay | Admitting: Emergency Medicine

## 2013-12-14 ENCOUNTER — Inpatient Hospital Stay (HOSPITAL_COMMUNITY)
Admission: EM | Admit: 2013-12-14 | Discharge: 2013-12-17 | DRG: 378 | Disposition: A | Discharge status: Nursing Home (Includes ICF) | Payer: Medicare Other | Attending: Internal Medicine | Admitting: Internal Medicine

## 2013-12-14 ENCOUNTER — Encounter: Payer: Self-pay | Admitting: Family Medicine

## 2013-12-14 VITALS — BP 168/70 | Temp 98.4°F | Ht 74.0 in | Wt 184.0 lb

## 2013-12-14 DIAGNOSIS — E860 Dehydration: Secondary | ICD-10-CM | POA: Diagnosis present

## 2013-12-14 DIAGNOSIS — E119 Type 2 diabetes mellitus without complications: Secondary | ICD-10-CM

## 2013-12-14 DIAGNOSIS — Z96649 Presence of unspecified artificial hip joint: Secondary | ICD-10-CM | POA: Diagnosis not present

## 2013-12-14 DIAGNOSIS — H9190 Unspecified hearing loss, unspecified ear: Secondary | ICD-10-CM | POA: Diagnosis present

## 2013-12-14 DIAGNOSIS — I509 Heart failure, unspecified: Secondary | ICD-10-CM | POA: Diagnosis present

## 2013-12-14 DIAGNOSIS — K921 Melena: Secondary | ICD-10-CM | POA: Diagnosis not present

## 2013-12-14 DIAGNOSIS — H3530 Unspecified macular degeneration: Secondary | ICD-10-CM | POA: Diagnosis present

## 2013-12-14 DIAGNOSIS — Z7982 Long term (current) use of aspirin: Secondary | ICD-10-CM

## 2013-12-14 DIAGNOSIS — K449 Diaphragmatic hernia without obstruction or gangrene: Secondary | ICD-10-CM | POA: Diagnosis present

## 2013-12-14 DIAGNOSIS — K644 Residual hemorrhoidal skin tags: Secondary | ICD-10-CM | POA: Diagnosis present

## 2013-12-14 DIAGNOSIS — Z87891 Personal history of nicotine dependence: Secondary | ICD-10-CM

## 2013-12-14 DIAGNOSIS — G464 Cerebellar stroke syndrome: Secondary | ICD-10-CM | POA: Diagnosis present

## 2013-12-14 DIAGNOSIS — G608 Other hereditary and idiopathic neuropathies: Secondary | ICD-10-CM | POA: Diagnosis present

## 2013-12-14 DIAGNOSIS — M5137 Other intervertebral disc degeneration, lumbosacral region: Secondary | ICD-10-CM | POA: Diagnosis present

## 2013-12-14 DIAGNOSIS — K625 Hemorrhage of anus and rectum: Secondary | ICD-10-CM

## 2013-12-14 DIAGNOSIS — Z79899 Other long term (current) drug therapy: Secondary | ICD-10-CM

## 2013-12-14 DIAGNOSIS — K5731 Diverticulosis of large intestine without perforation or abscess with bleeding: Principal | ICD-10-CM | POA: Diagnosis present

## 2013-12-14 DIAGNOSIS — I10 Essential (primary) hypertension: Secondary | ICD-10-CM

## 2013-12-14 DIAGNOSIS — G609 Hereditary and idiopathic neuropathy, unspecified: Secondary | ICD-10-CM | POA: Diagnosis present

## 2013-12-14 DIAGNOSIS — IMO0002 Reserved for concepts with insufficient information to code with codable children: Secondary | ICD-10-CM

## 2013-12-14 DIAGNOSIS — M109 Gout, unspecified: Secondary | ICD-10-CM | POA: Diagnosis present

## 2013-12-14 DIAGNOSIS — D62 Acute posthemorrhagic anemia: Secondary | ICD-10-CM | POA: Diagnosis not present

## 2013-12-14 DIAGNOSIS — M1710 Unilateral primary osteoarthritis, unspecified knee: Secondary | ICD-10-CM | POA: Diagnosis present

## 2013-12-14 DIAGNOSIS — Z8673 Personal history of transient ischemic attack (TIA), and cerebral infarction without residual deficits: Secondary | ICD-10-CM

## 2013-12-14 DIAGNOSIS — Z7902 Long term (current) use of antithrombotics/antiplatelets: Secondary | ICD-10-CM

## 2013-12-14 DIAGNOSIS — K922 Gastrointestinal hemorrhage, unspecified: Secondary | ICD-10-CM

## 2013-12-14 DIAGNOSIS — T4595XA Adverse effect of unspecified primarily systemic and hematological agent, initial encounter: Secondary | ICD-10-CM | POA: Diagnosis present

## 2013-12-14 DIAGNOSIS — N400 Enlarged prostate without lower urinary tract symptoms: Secondary | ICD-10-CM | POA: Diagnosis present

## 2013-12-14 DIAGNOSIS — I872 Venous insufficiency (chronic) (peripheral): Secondary | ICD-10-CM | POA: Diagnosis present

## 2013-12-14 DIAGNOSIS — E1149 Type 2 diabetes mellitus with other diabetic neurological complication: Secondary | ICD-10-CM | POA: Diagnosis present

## 2013-12-14 DIAGNOSIS — K5730 Diverticulosis of large intestine without perforation or abscess without bleeding: Secondary | ICD-10-CM | POA: Diagnosis present

## 2013-12-14 DIAGNOSIS — E782 Mixed hyperlipidemia: Secondary | ICD-10-CM | POA: Diagnosis present

## 2013-12-14 DIAGNOSIS — E1142 Type 2 diabetes mellitus with diabetic polyneuropathy: Secondary | ICD-10-CM | POA: Diagnosis present

## 2013-12-14 DIAGNOSIS — R42 Dizziness and giddiness: Secondary | ICD-10-CM

## 2013-12-14 DIAGNOSIS — Z66 Do not resuscitate: Secondary | ICD-10-CM | POA: Diagnosis present

## 2013-12-14 DIAGNOSIS — Z791 Long term (current) use of non-steroidal anti-inflammatories (NSAID): Secondary | ICD-10-CM

## 2013-12-14 DIAGNOSIS — M170 Bilateral primary osteoarthritis of knee: Secondary | ICD-10-CM | POA: Diagnosis present

## 2013-12-14 DIAGNOSIS — M51379 Other intervertebral disc degeneration, lumbosacral region without mention of lumbar back pain or lower extremity pain: Secondary | ICD-10-CM | POA: Diagnosis present

## 2013-12-14 DIAGNOSIS — K2960 Other gastritis without bleeding: Secondary | ICD-10-CM | POA: Diagnosis present

## 2013-12-14 DIAGNOSIS — R7989 Other specified abnormal findings of blood chemistry: Secondary | ICD-10-CM | POA: Diagnosis present

## 2013-12-14 HISTORY — DX: Benign prostatic hyperplasia without lower urinary tract symptoms: N40.0

## 2013-12-14 HISTORY — DX: Cerebellar stroke syndrome: G46.4

## 2013-12-14 HISTORY — DX: Heart failure, unspecified: I50.9

## 2013-12-14 HISTORY — DX: Other intervertebral disc degeneration, lumbar region: M51.36

## 2013-12-14 HISTORY — DX: Cerebral infarction, unspecified: I63.9

## 2013-12-14 LAB — CBC
HCT: 30.7 % — ABNORMAL LOW (ref 39.0–52.0)
HEMOGLOBIN: 10.5 g/dL — AB (ref 13.0–17.0)
MCH: 33.7 pg (ref 26.0–34.0)
MCHC: 34.2 g/dL (ref 30.0–36.0)
MCV: 98.4 fL (ref 78.0–100.0)
Platelets: 209 10*3/uL (ref 150–400)
RBC: 3.12 MIL/uL — AB (ref 4.22–5.81)
RDW: 13.8 % (ref 11.5–15.5)
WBC: 6.6 10*3/uL (ref 4.0–10.5)

## 2013-12-14 LAB — CBC WITH DIFFERENTIAL/PLATELET
BASOS ABS: 0 10*3/uL (ref 0.0–0.1)
BASOS PCT: 1 % (ref 0–1)
EOS PCT: 3 % (ref 0–5)
Eosinophils Absolute: 0.2 10*3/uL (ref 0.0–0.7)
HCT: 33.3 % — ABNORMAL LOW (ref 39.0–52.0)
Hemoglobin: 11.2 g/dL — ABNORMAL LOW (ref 13.0–17.0)
Lymphocytes Relative: 40 % (ref 12–46)
Lymphs Abs: 3.2 10*3/uL (ref 0.7–4.0)
MCH: 33.4 pg (ref 26.0–34.0)
MCHC: 33.6 g/dL (ref 30.0–36.0)
MCV: 99.4 fL (ref 78.0–100.0)
MONO ABS: 0.6 10*3/uL (ref 0.1–1.0)
Monocytes Relative: 7 % (ref 3–12)
NEUTROS ABS: 4.1 10*3/uL (ref 1.7–7.7)
Neutrophils Relative %: 50 % (ref 43–77)
Platelets: 246 10*3/uL (ref 150–400)
RBC: 3.35 MIL/uL — ABNORMAL LOW (ref 4.22–5.81)
RDW: 13.9 % (ref 11.5–15.5)
WBC: 8.1 10*3/uL (ref 4.0–10.5)

## 2013-12-14 LAB — URINALYSIS W MICROSCOPIC + REFLEX CULTURE
BILIRUBIN URINE: NEGATIVE
GLUCOSE, UA: NEGATIVE mg/dL
Hgb urine dipstick: NEGATIVE
Ketones, ur: NEGATIVE mg/dL
Leukocytes, UA: NEGATIVE
Nitrite: NEGATIVE
PH: 6 (ref 5.0–8.0)
Protein, ur: NEGATIVE mg/dL
Specific Gravity, Urine: 1.025 (ref 1.005–1.030)
Urobilinogen, UA: 0.2 mg/dL (ref 0.0–1.0)

## 2013-12-14 LAB — APTT: aPTT: 26 seconds (ref 24–37)

## 2013-12-14 LAB — PROTIME-INR
INR: 0.96 (ref 0.00–1.49)
Prothrombin Time: 12.6 seconds (ref 11.6–15.2)

## 2013-12-14 LAB — BASIC METABOLIC PANEL
BUN: 34 mg/dL — AB (ref 6–23)
CALCIUM: 9.2 mg/dL (ref 8.4–10.5)
CO2: 27 mEq/L (ref 19–32)
CREATININE: 1.18 mg/dL (ref 0.50–1.35)
Chloride: 103 mEq/L (ref 96–112)
GFR calc non Af Amer: 50 mL/min — ABNORMAL LOW (ref >90)
GFR, EST AFRICAN AMERICAN: 58 mL/min — AB (ref >90)
Glucose, Bld: 127 mg/dL — ABNORMAL HIGH (ref 70–99)
Potassium: 5.3 mEq/L (ref 3.7–5.3)
Sodium: 140 mEq/L (ref 137–147)

## 2013-12-14 LAB — GLUCOSE, CAPILLARY: GLUCOSE-CAPILLARY: 106 mg/dL — AB (ref 70–99)

## 2013-12-14 LAB — TROPONIN I: Troponin I: <0.3 ng/mL (ref <0.30)

## 2013-12-14 LAB — POCT HEMOGLOBIN: HEMOGLOBIN: 10.5 g/dL — AB (ref 14.1–18.1)

## 2013-12-14 LAB — SAMPLE TO BLOOD BANK

## 2013-12-14 LAB — LACTIC ACID, PLASMA: Lactic Acid, Venous: 1 mmol/L (ref 0.5–2.2)

## 2013-12-14 LAB — OCCULT BLOOD, POC DEVICE: Fecal Occult Bld: POSITIVE — AB

## 2013-12-14 MED ORDER — SODIUM CHLORIDE 0.9 % IJ SOLN
3.0000 mL | Freq: Two times a day (BID) | INTRAMUSCULAR | Status: DC
  Administered 2013-12-14: 3 mL via INTRAVENOUS

## 2013-12-14 MED ORDER — ONDANSETRON HCL 4 MG PO TABS: 4.0000 mg | ORAL_TABLET | Freq: Four times a day (QID) | ORAL | Status: DC | PRN

## 2013-12-14 MED ORDER — ALBUTEROL SULFATE (2.5 MG/3ML) 0.083% IN NEBU: 2.5000 mg | INHALATION_SOLUTION | RESPIRATORY_TRACT | Status: DC | PRN

## 2013-12-14 MED ORDER — ACETAMINOPHEN 650 MG RE SUPP: 650.0000 mg | Freq: Four times a day (QID) | RECTAL | Status: DC | PRN

## 2013-12-14 MED ORDER — HYDRALAZINE HCL 20 MG/ML IJ SOLN
5.0000 mg | Freq: Four times a day (QID) | INTRAMUSCULAR | Status: DC | PRN
  Filled 2013-12-14: qty 1

## 2013-12-14 MED ORDER — IOHEXOL 300 MG/ML  SOLN
50.0000 mL | Freq: Once | INTRAMUSCULAR | Status: AC | PRN
  Administered 2013-12-14: 50 mL via ORAL

## 2013-12-14 MED ORDER — SODIUM CHLORIDE 0.9 % IV SOLN
INTRAVENOUS | Status: DC
Start: 2013-12-14 — End: 2013-12-17

## 2013-12-14 MED ORDER — IOHEXOL 300 MG/ML  SOLN
100.0000 mL | Freq: Once | INTRAMUSCULAR | Status: AC | PRN
  Administered 2013-12-14: 100 mL via INTRAVENOUS

## 2013-12-14 MED ORDER — ENALAPRIL MALEATE 5 MG PO TABS
5.0000 mg | ORAL_TABLET | Freq: Two times a day (BID) | ORAL | Status: DC
  Administered 2013-12-14 – 2013-12-17 (×6): 5 mg via ORAL
  Filled 2013-12-14 (×7): qty 1

## 2013-12-14 MED ORDER — FINASTERIDE 5 MG PO TABS
5.0000 mg | ORAL_TABLET | Freq: Every day | ORAL | Status: DC
  Administered 2013-12-15 – 2013-12-17 (×3): 5 mg via ORAL
  Filled 2013-12-14 (×5): qty 1

## 2013-12-14 MED ORDER — ALPRAZOLAM 0.25 MG PO TABS
0.2500 mg | ORAL_TABLET | Freq: Two times a day (BID) | ORAL | Status: DC | PRN
  Administered 2013-12-15 – 2013-12-16 (×2): 0.25 mg via ORAL
  Filled 2013-12-14 (×2): qty 1

## 2013-12-14 MED ORDER — SODIUM CHLORIDE 0.9 % IV SOLN
INTRAVENOUS | Status: DC
  Administered 2013-12-14: 18:00:00 via INTRAVENOUS

## 2013-12-14 MED ORDER — INSULIN ASPART 100 UNIT/ML ~~LOC~~ SOLN
0.0000 [IU] | SUBCUTANEOUS | Status: DC
  Administered 2013-12-15: 3 [IU] via SUBCUTANEOUS
  Administered 2013-12-16: 2 [IU] via SUBCUTANEOUS

## 2013-12-14 MED ORDER — ALLOPURINOL 300 MG PO TABS
300.0000 mg | ORAL_TABLET | Freq: Every day | ORAL | Status: DC
  Administered 2013-12-15 – 2013-12-17 (×3): 300 mg via ORAL
  Filled 2013-12-14 (×3): qty 1

## 2013-12-14 MED ORDER — ACETAMINOPHEN 325 MG PO TABS: 650.0000 mg | ORAL_TABLET | Freq: Four times a day (QID) | ORAL | Status: DC | PRN

## 2013-12-14 MED ORDER — ONDANSETRON HCL 4 MG/2ML IJ SOLN: 4.0000 mg | Freq: Four times a day (QID) | INTRAMUSCULAR | Status: DC | PRN

## 2013-12-14 MED ORDER — PANTOPRAZOLE SODIUM 40 MG IV SOLR
40.0000 mg | Freq: Two times a day (BID) | INTRAVENOUS | Status: DC
  Administered 2013-12-14 – 2013-12-15 (×2): 40 mg via INTRAVENOUS
  Filled 2013-12-14 (×2): qty 40

## 2013-12-14 MED ORDER — MECLIZINE HCL 12.5 MG PO TABS: 12.5000 mg | ORAL_TABLET | Freq: Three times a day (TID) | ORAL | Status: DC | PRN

## 2013-12-14 NOTE — ED Notes (Signed)
When standing pt had blood spot on bed and under pants

## 2013-12-14 NOTE — ED Notes (Signed)
Finished contrast, family at the bedside, gave warm blanket.

## 2013-12-14 NOTE — Progress Notes (Signed)
   Subjective:    Patient ID: Christian Thomas, male    DOB: Apr 04, 1915, 80 y.o.   MRN: [id number]  Rectal Bleeding  The current episode started 3 to 5 days ago. The problem occurs frequently. The problem has been unchanged. Associated symptoms include hemorrhoids. He has been eating and drinking normally.   Patient has started to notice blood in his stools the last 2 days. Increase amount of blood at times. Mild abdominal discomfort.  No fever or chills.  No history of major chest pain or back pain no change in urinary or bowel habits.  Known diverticulosis.  Some unsteadiness and dizziness but not different from baseline. Patient very worried about seen gross amount of blood in his stool.   Review of Systems  Gastrointestinal: Positive for hematochezia and hemorrhoids.   No chest pain no back pain no abdominal pain chronic constipation. Known diverticulosis    Objective:   Physical Exam  Alert mild distress tearful. HEENT normal. Pressure elevated 152/70 on repeat lungs clear. Heart regular in rhythm. Abdomen no discrete tenderness no rebound no guarding. Rectal exam grossly bloody stool.  I review the patient's prior hospital records.  I called and spoke with the hospitalist to recommend we called the emergency room.  I called and spoke to the ER Dr.  I also spoke with patient and family after this.  Hemoglobin 10.5    Assessment & Plan:  Impression gross hematochezia. Hemoglobin now 10-1/2 13-1/2 to several months ago. Still has had substantial blood loss. Several possible etiologies diverticulosis is at the top otherwise. Easily 35+ minutes spent with patient and family multiple doctors etc. discussing approach to this situation plan to the ER for further workup. Will require hospitalization. Probable endoscopy potential intervention.

## 2013-12-14 NOTE — ED Notes (Signed)
Patient has rectal bleeding (dark blood) x3-4 days ago. Patient went to Dr Lance Sell office. HGB 10-was 13 in October. Large amount of blood per Dr Charlann Boxer sent here.

## 2013-12-14 NOTE — ED Provider Notes (Signed)
CSN: [id number]     Arrival date & time 12/14/13  1705 History   First MD Initiated Contact with Patient 12/14/13 1707     Chief Complaint  Patient presents with  . Rectal Bleeding    HPI Pt was seen at 1715.  Per pt and his family, c/o gradual onset and persistence of constant rectal bleeding for the past 3 to 4 days. Has been associated with diarrhea, "dark" stools, and "red" blood per rectum. Pt was evaluated by his PMD PTA, and sent to the ED for further evaluation and admission. Pt denies abd pain, no rectal pain, no N/V, no CP/SOB, no fevers, no back pain.    Past Medical History  Diagnosis Date  . Diverticulosis   . Mixed hyperlipidemia   . Gout   . Clostridium difficile colitis 12/2008  . Chronic constipation   . Essential hypertension, benign   . Type 2 diabetes mellitus   . Venous insufficiency   . Benign essential tremor   . Macular degeneration   . Diabetic neuropathy   . Dysrhythmia   . CHF (congestive heart failure)   . Stroke   . Cerebellar stroke syndrome   . DDD (degenerative disc disease), lumbar   . BPH (benign prostatic hypertrophy)    Past Surgical History  Procedure Laterality Date  . Hernia repair    . Hemorrhoid surgery    . Appendectomy    . Colonoscopy      NL TI, pan-TICS, sml IH, CDIFF COLITIS  . Sigmoidoscopy      Internal hemorrhoids  . Hip arthroplasty  11/07/2012    Procedure: ARTHROPLASTY BIPOLAR HIP;  Surgeon: Mauri Pole, MD;  Location: [facility];  Service: Orthopedics;  Laterality: Right;   Family History  Problem Relation Age of Onset  . Cancer Mother     Breast   History  Substance Use Topics  . Smoking status: Former Smoker -- 0.50 packs/day for 54 years    Types: Cigarettes    Quit date: 11/19/1992  . Smokeless tobacco: Never Used  . Alcohol Use: Yes     Comment: Rare    Review of Systems ROS: Statement: All systems negative except as marked or noted in the HPI; Constitutional: Negative for fever and chills. ; ; Eyes:  Negative for eye pain, redness and discharge. ; ; ENMT: Negative for ear pain, hoarseness, nasal congestion, sinus pressure and sore throat. ; ; Cardiovascular: Negative for chest pain, palpitations, diaphoresis, dyspnea and peripheral edema. ; ; Respiratory: Negative for cough, wheezing and stridor. ; ; Gastrointestinal: Negative for nausea, vomiting, abdominal pain, +diarrhea, black stool, blood in stool, rectal bleeding. . ; ; Genitourinary: Negative for dysuria, flank pain and hematuria. ; ; Musculoskeletal: Negative for back pain and neck pain. Negative for swelling and trauma.; ; Skin: Negative for pruritus, rash, abrasions, blisters, bruising and skin lesion.; ; Neuro: Negative for headache, lightheadedness and neck stiffness. Negative for weakness, altered level of consciousness , altered mental status, extremity weakness, paresthesias, involuntary movement, seizure and syncope.     Allergies  Morphine and related and Other  Home Medications   Current Outpatient Rx  Name  Route  Sig  Dispense  Refill  . allopurinol (ZYLOPRIM) 300 MG tablet      TAKE 1 TABLET BY MOUTH ONCE DAILY FOR GOUT.   30 tablet   2   . aspirin EC 81 MG tablet   Oral   Take 81 mg by mouth daily.         Marland Kitchen  clopidogrel (PLAVIX) 75 MG tablet      TAKE 1 TABLET BY MOUTH EVERY MORNING.   30 tablet   3   . enalapril (VASOTEC) 5 MG tablet   Oral   Take 1 tablet (5 mg total) by mouth 2 (two) times daily. TAKE (1) TABLET BY MOUTH EACH MORNING.   60 tablet   5   . etodolac (LODINE) 300 MG capsule      TAKE (1) CAPSULE BY MOUTH TWICE DAILY WITH FOOD.   60 capsule   5   . ferrous sulfate 325 (65 FE) MG tablet      TAKE 1 TABLET BY MOUTH 3 TIMES DAILY.   90 tablet   2   . finasteride (PROSCAR) 5 MG tablet      TAKE 1 TABLET BY MOUTH ONCE DAILY.   30 tablet   5   . loratadine (CLARITIN) 10 MG tablet      TAKE 1 TABLET BY MOUTH ONCE DAILY FOR ALLERGIC RHINITIS.   30 tablet   5   . lovastatin  (MEVACOR) 40 MG tablet      TAKE (1) TABLET BY MOUTH AT BEDTIME FOR CHOLESTEROL.   30 tablet   5   . metFORMIN (GLUCOPHAGE) 500 MG tablet      TAKE 1 TABLET BY MOUTH TWICE DAILY.   60 tablet   2   . Multiple Vitamins-Minerals (ICAPS) CAPS      TAKE (2) TABLETS BY MOUTH ONCE DAILY.   60 capsule   5   . omeprazole (PRILOSEC) 20 MG capsule      TAKE ONE CAPSULE ORALLY EVERY MORNING.   30 capsule   5   . polyethylene glycol powder (GLYCOLAX/MIRALAX) powder      MIX 1 CAPFUL (17G) IN 8 OUNCES OF JUICE/WATER AND DRINK 3 TIMES DAILY.   1581 g   5   . Q-PAP 500 MG tablet      TAKE 2 TABLETS BY MOUTH TWICE DAILY.   120 tablet   5   . senna (SENOKOT) 8.6 MG TABS   Oral   Take 2 tablets (17.2 mg total) by mouth at bedtime.   120 each      . Specialty Vitamins Products (ICAPS LUTEIN-ZEAXANTHIN PO)   Oral   Take 2 tablets by mouth daily.         . traMADol (ULTRAM) 50 MG tablet   Oral   Take 50 mg by mouth daily with supper.         . ALPRAZolam (XANAX) 0.25 MG tablet   Oral   Take 1 tablet (0.25 mg total) by mouth 2 (two) times daily as needed for anxiety.   60 tablet   0   . diphenhydrAMINE (BENADRYL) 25 MG tablet   Oral   Take 25 mg by mouth every 6 (six) hours as needed for itching.         . INSTA-GLUCOSE 77.4 % GEL      GIVE 1 TUBE BY MOUTH IF B/S <60; WAIT 15 MINUTES THEN RECHECK.   93 g   2   . lactulose (CHRONULAC) 10 GM/15ML solution   Oral   Take 15 g by mouth daily as needed. constipation         . meclizine (ANTIVERT) 25 MG tablet      TAKE 1 TABLET BY MOUTH EVERY 6 HOURS AS NEEDED FOR DIZZINESS.   30 tablet   3   . SORE THROAT LIQD  USE 5 SPRAYS IN THROAT EVERY 2 HOURS AS NEEDED FOR SORE THROAT.   177 mL   3    BP 193/78  Pulse 74 Filed Vitals:   12/14/13 1758 12/14/13 1800 12/14/13 1809  BP: 227/111 232/93 193/78  Pulse: 65 68 74   Physical Exam 1720: Physical examination:  Nursing notes reviewed; Vital signs  and O2 SAT reviewed;  Constitutional: Well developed, Well nourished, Well hydrated, In no acute distress; Head:  Normocephalic, atraumatic; Eyes: EOMI, PERRL, No scleral icterus; ENMT: Mouth and pharynx normal, Mucous membranes moist; Neck: Supple, Full range of motion, No lymphadenopathy; Cardiovascular: Regular rate and rhythm, No gallop; Respiratory: Breath sounds clear & equal bilaterally, No rales, rhonchi, wheezes.  Speaking full sentences with ease, Normal respiratory effort/excursion; Chest: Nontender, Movement normal; Abdomen: Soft, Nontender, Nondistended, Normal bowel sounds. Rectal exam performed w/permission of pt and ED RN chaperone present.  Anal tone normal.  Non-tender, soft maroon stool in rectal vault, heme positive.  No fissures, no external hemorrhoids, no palp masses.; Genitourinary: No CVA tenderness; Extremities: Pulses normal, No tenderness, No edema, No calf edema or asymmetry.; Neuro: AA&Ox3, +HOH, otherwise major CN grossly intact.  Speech clear. No gross focal motor or sensory deficits in extremities. Climbs on and off stretcher easily by himself.; Skin: Color normal, Warm, Dry.   ED Course  Procedures   EKG Interpretation    Date/Time:  Friday December 14 2013 17:18:56 EST Ventricular Rate:  73 PR Interval:  200 QRS Duration: 142 QT Interval:  424 QTC Calculation: 467 R Axis:   -52 Text Interpretation:  Normal sinus rhythm Left axis deviation Right bundle branch block Left anterior fascicular block  Bifascicular block  Moderate voltage criteria for LVH, may be normal variant Abnormal ECG When compared with ECG of 19-Aug-2013 08:44, No significant change was found Confirmed by [facility]  MD, Nunzio Cory [phone]) on 12/14/2013 6:34:11 PM            MDM  MDM Reviewed: previous chart, nursing note and vitals Reviewed previous: labs and ECG Interpretation: labs, ECG, x-ray and CT scan     Results for orders placed during the hospital encounter of 12/14/13  CBC WITH  DIFFERENTIAL      Result Value Range   WBC 8.1  4.0 - 10.5 K/uL   RBC 3.35 (*) 4.22 - 5.81 MIL/uL   Hemoglobin 11.2 (*) 13.0 - 17.0 g/dL   HCT 33.3 (*) 39.0 - 52.0 %   MCV 99.4  78.0 - 100.0 fL   MCH 33.4  26.0 - 34.0 pg   MCHC 33.6  30.0 - 36.0 g/dL   RDW 13.9  11.5 - 15.5 %   Platelets 246  150 - 400 K/uL   Neutrophils Relative % 50  43 - 77 %   Neutro Abs 4.1  1.7 - 7.7 K/uL   Lymphocytes Relative 40  12 - 46 %   Lymphs Abs 3.2  0.7 - 4.0 K/uL   Monocytes Relative 7  3 - 12 %   Monocytes Absolute 0.6  0.1 - 1.0 K/uL   Eosinophils Relative 3  0 - 5 %   Eosinophils Absolute 0.2  0.0 - 0.7 K/uL   Basophils Relative 1  0 - 1 %   Basophils Absolute 0.0  0.0 - 0.1 K/uL  BASIC METABOLIC PANEL      Result Value Range   Sodium 140  137 - 147 mEq/L   Potassium 5.3  3.7 - 5.3 mEq/L   Chloride 103  96 - 112 mEq/L   CO2 27  19 - 32 mEq/L   Glucose, Bld 127 (*) 70 - 99 mg/dL   BUN 34 (*) 6 - 23 mg/dL   Creatinine, Ser 1.18  0.50 - 1.35 mg/dL   Calcium 9.2  8.4 - 10.5 mg/dL   GFR calc non Af Amer 50 (*) >90 mL/min   GFR calc Af Amer 58 (*) >90 mL/min  URINALYSIS W MICROSCOPIC + REFLEX CULTURE      Result Value Range   Color, Urine YELLOW  YELLOW   APPearance CLEAR  CLEAR   Specific Gravity, Urine 1.025  1.005 - 1.030   pH 6.0  5.0 - 8.0   Glucose, UA NEGATIVE  NEGATIVE mg/dL   Hgb urine dipstick NEGATIVE  NEGATIVE   Bilirubin Urine NEGATIVE  NEGATIVE   Ketones, ur NEGATIVE  NEGATIVE mg/dL   Protein, ur NEGATIVE  NEGATIVE mg/dL   Urobilinogen, UA 0.2  0.0 - 1.0 mg/dL   Nitrite NEGATIVE  NEGATIVE   Leukocytes, UA NEGATIVE  NEGATIVE  TROPONIN I      Result Value Range   Troponin I <0.30  <0.30 ng/mL  LACTIC ACID, PLASMA      Result Value Range   Lactic Acid, Venous 1.0  0.5 - 2.2 mmol/L  OCCULT BLOOD, POC DEVICE      Result Value Range   Fecal Occult Bld POSITIVE (*) NEGATIVE  SAMPLE TO BLOOD BANK      Result Value Range   Blood Bank Specimen [id number]     Sample Expiration  12/15/2013     Ct Abdomen Pelvis W Contrast 12/14/2013   CLINICAL DATA:  Rectal bleeding, dark blood per rectum for 3-4 days, history of C difficile colitis, diverticulosis, hypertension, diabetes, CHF, stroke  EXAM: CT ABDOMEN AND PELVIS WITH CONTRAST  TECHNIQUE: Multidetector CT imaging of the abdomen and pelvis was performed using the standard protocol following bolus administration of intravenous contrast. Sagittal and coronal MPR images reconstructed from axial data set.  CONTRAST:  99mL OMNIPAQUE IOHEXOL 300 MG/ML SOLN, 131mL OMNIPAQUE IOHEXOL 300 MG/ML SOLN  COMPARISON:  10/11/2007  FINDINGS: Minimal peripheral fibrosis at right lung base.  Atherosclerotic calcifications aorta, iliac arteries and branch vessels.  Few tiny pancreatic calcifications question representing chronic calcific pancreatitis.  Liver, spleen, pancreas, kidneys, and adrenal glands otherwise normal appearance.  Small splenules adjacent to spleen.  Beam hardening artifacts from right hip prosthesis obscure portions of pelvis.  Minimal prostatic enlargement with unremarkable bladder and visualized ureters.  Stomach and small bowel loops grossly unremarkable.  Diverticulosis of distal descending and sigmoid colon without evidence of acute diverticulitis.  Appendix surgically absent by history.  Scattered motion artifacts.  No mass, adenopathy, free fluid, or inflammatory process.  Bones diffusely demineralized.  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  Distal colonic diverticulosis without evidence of diverticulitis.  No definite acute intra-abdominal or intrapelvic process otherwise identified.   Electronically Signed   By: Lavonia Dana M.D.   On: 12/14/2013 19:29   Results for ERYC, KEITZ (MRN [id number]) as of 12/14/2013 19:39  Ref. Range 11/08/2012 04:56 11/09/2012 04:10 08/19/2013 09:00 12/14/2013 16:05 12/14/2013 17:17  Hemoglobin Latest Range: 13.0-17.0 g/dL  10.6 (L) 9.5 (L) 13.4 10.5 (A) 11.2 (L)  HCT Latest Range: 39.0-52.0 % 31.1 (L) 28.6 (L) 39.9  33.3 (L)    1940:  H/H per baseline. No acute findings on CT scan. +rectal bleeding while in the ED. Dx and testing  d/w pt and family.  Questions answered.  Verb understanding, agreeable to admit.  T/C to [facility] [place] Pink, case discussed, including:  HPI, pertinent PM/SHx, VS/PE, dx testing, ED course and treatment:  Agreeable to admit, requests to write temporary orders, obtain inpatient tele bed to team 1.   Alfonzo Feller, DO 12/17/13 [phone]

## 2013-12-14 NOTE — H&P (Signed)
[facility] History and Physical  Christian Thomas:[id number] DOB: 11-14-1915 DOA: 12/14/2013   PCP: Rubbie Battiest, MD  Specialists: Patient is followed by cardiology. He was last seen by gastroenterology in 2012.  Chief Complaint: Blood per rectum  HPI: Christian Thomas is a 80 y.o. male who lives in an assisted-living facility and is hard of hearing with a past medical history of hypertension, diabetes, neuropathy and who was in his usual state of health about 3-4 days ago, when he started noticing black stools, and small amounts of blood in the stool. He was having frequent bowel movements. And, then the intensity of bleeding got worse over the last couple days. He was seeing more blood in his stool. Patient denies any abdominal discomfort, however, did have some cramping whenever he would have a bowel movement. He has lost a few pounds in the last 2-3 weeks. However, is unable to quantify. He tells me that he loses weight and then gains it back. No nausea, vomiting. He does get heartburn occasionally, but none recently. No fever. No chills. It appears that he was treated with amoxicillin for sinusitis earlier this month and subsequently started having diarrhea about 3-4 times a day, but at that time he did not see any blood in the stool. It appears based on previous records that he had a flexible sigmoidoscopy in March of 2011 for evaluation of constipation, which showed diverticulosis. No masses were noted. Small to moderate internal hemorrhoids were seen. Subsequently, he was sent to [facility] for anorectal manometric. Patient does have a history of chronic constipation. Based on subsequent notes it appears that Manometry showed normal colonic transit and anorectal manometric. Biofeedback was recommended but the patient was not interested. Patient denies any dizziness, lightheadedness. No syncopal episodes.  Home Medications: Prior to Admission medications   Medication Sig Start Date  End Date Taking? Authorizing Provider  allopurinol (ZYLOPRIM) 300 MG tablet TAKE 1 TABLET BY MOUTH ONCE DAILY FOR GOUT. 11/07/13  Yes Mikey Kirschner, MD  aspirin EC 81 MG tablet Take 81 mg by mouth daily.   Yes Historical Provider, MD  clopidogrel (PLAVIX) 75 MG tablet TAKE 1 TABLET BY MOUTH EVERY MORNING. 11/05/13  Yes Mikey Kirschner, MD  enalapril (VASOTEC) 5 MG tablet Take 1 tablet (5 mg total) by mouth 2 (two) times daily. TAKE (1) TABLET BY MOUTH EACH MORNING. 09/26/13  Yes Satira Sark, MD  etodolac (LODINE) 300 MG capsule TAKE (1) CAPSULE BY MOUTH TWICE DAILY WITH FOOD. 10/31/13  Yes Mikey Kirschner, MD  ferrous sulfate 325 (65 FE) MG tablet TAKE 1 TABLET BY MOUTH 3 TIMES DAILY. 11/02/13  Yes Mikey Kirschner, MD  finasteride (PROSCAR) 5 MG tablet TAKE 1 TABLET BY MOUTH ONCE DAILY. 11/22/13  Yes Mikey Kirschner, MD  loratadine (CLARITIN) 10 MG tablet TAKE 1 TABLET BY MOUTH ONCE DAILY FOR ALLERGIC RHINITIS. 09/19/13  Yes Mikey Kirschner, MD  lovastatin (MEVACOR) 40 MG tablet TAKE (1) TABLET BY MOUTH AT BEDTIME FOR CHOLESTEROL. 11/22/13  Yes Mikey Kirschner, MD  metFORMIN (GLUCOPHAGE) 500 MG tablet TAKE 1 TABLET BY MOUTH TWICE DAILY. 09/19/13  Yes Mikey Kirschner, MD  Multiple Vitamins-Minerals (ICAPS) CAPS TAKE (2) TABLETS BY MOUTH ONCE DAILY. 11/28/13  Yes Mikey Kirschner, MD  omeprazole (PRILOSEC) 20 MG capsule TAKE ONE CAPSULE ORALLY EVERY MORNING. 10/01/13  Yes Mikey Kirschner, MD  polyethylene glycol powder (GLYCOLAX/MIRALAX) powder MIX 1 CAPFUL (17G) IN 8 OUNCES OF JUICE/WATER AND  DRINK 3 TIMES DAILY. 10/04/13  Yes Mikey Kirschner, MD  Q-PAP 500 MG tablet TAKE 2 TABLETS BY MOUTH TWICE DAILY. 06/11/13  Yes Mikey Kirschner, MD  senna (SENOKOT) 8.6 MG TABS Take 2 tablets (17.2 mg total) by mouth at bedtime. 11/09/12  Yes Hosie Poisson, MD  Specialty Vitamins Products (ICAPS LUTEIN-ZEAXANTHIN PO) Take 2 tablets by mouth daily.   Yes Historical Provider, MD  traMADol (ULTRAM) 50 MG  tablet Take 50 mg by mouth daily with supper.   Yes Historical Provider, MD  ALPRAZolam (XANAX) 0.25 MG tablet Take 1 tablet (0.25 mg total) by mouth 2 (two) times daily as needed for anxiety. 10/08/13 10/08/14  Mikey Kirschner, MD  diphenhydrAMINE (BENADRYL) 25 MG tablet Take 25 mg by mouth every 6 (six) hours as needed for itching.    Historical Provider, MD  INSTA-GLUCOSE 77.4 % GEL GIVE 1 TUBE BY MOUTH IF B/S <60; WAIT 15 MINUTES THEN RECHECK. 11/09/13   Mikey Kirschner, MD  lactulose (CHRONULAC) 10 GM/15ML solution Take 15 g by mouth daily as needed. constipation    Historical Provider, MD  meclizine (ANTIVERT) 25 MG tablet TAKE 1 TABLET BY MOUTH EVERY 6 HOURS AS NEEDED FOR DIZZINESS. 11/05/13   Mikey Kirschner, MD  SORE THROAT LIQD USE 5 SPRAYS IN THROAT EVERY 2 HOURS AS NEEDED FOR SORE THROAT. 11/16/13   Kathyrn Drown, MD    Allergies:  Allergies  Allergen Reactions  . Morphine And Related   . Other     Opioids     Past Medical History: Past Medical History  Diagnosis Date  . Diverticulosis   . Mixed hyperlipidemia   . Gout   . Clostridium difficile colitis 12/2008  . Chronic constipation   . Essential hypertension, benign   . Type 2 diabetes mellitus   . Venous insufficiency   . Benign essential tremor   . Macular degeneration   . Diabetic neuropathy   . Dysrhythmia   . CHF (congestive heart failure)   . Stroke   . Cerebellar stroke syndrome   . DDD (degenerative disc disease), lumbar   . BPH (benign prostatic hypertrophy)     Past Surgical History  Procedure Laterality Date  . Hernia repair    . Hemorrhoid surgery    . Appendectomy    . Colonoscopy      NL TI, pan-TICS, sml IH, CDIFF COLITIS  . Sigmoidoscopy      Internal hemorrhoids  . Hip arthroplasty  11/07/2012    Procedure: ARTHROPLASTY BIPOLAR HIP;  Surgeon: Mauri Pole, MD;  Location: [facility];  Service: Orthopedics;  Laterality: Right;    Social History: He lives in an assisted living facility.  Quit smoking about 25-30 years ago. No alcohol use. Uses a cane or walker to ambulate.  Family History:  Family History  Problem Relation Age of Onset  . Cancer Mother     Breast     Review of Systems - History obtained from the patient General ROS: negative Psychological ROS: negative Ophthalmic ROS: negative ENT ROS: positive for - hard of hearing Allergy and Immunology ROS: negative Hematological and Lymphatic ROS: negative Endocrine ROS: negative Respiratory ROS: no cough, shortness of breath, or wheezing Cardiovascular ROS: no chest pain or dyspnea on exertion Gastrointestinal ROS: as in hpi Genito-Urinary ROS: no dysuria, trouble voiding, or hematuria Musculoskeletal ROS: negative Neurological ROS: no TIA or stroke symptoms Dermatological ROS: negative  Physical Examination  Filed Vitals:   12/14/13 1758 12/14/13  1800 12/14/13 1809 12/14/13 2018  BP: 227/111 232/93 193/78 175/81  Pulse: 65 68 74 74  Resp:    20  SpO2:    97%    General appearance: alert, cooperative, appears stated age and no distress Head: Normocephalic, without obvious abnormality, atraumatic Eyes: conjunctivae/corneas clear. PERRL, EOM's intact.  Throat: lips, mucosa, and tongue normal; teeth and gums normal Neck: no adenopathy, no carotid bruit, no JVD, supple, symmetrical, trachea midline and thyroid not enlarged, symmetric, no tenderness/mass/nodules Resp: clear to auscultation bilaterally Cardio: regular rate and rhythm, S1, S2 normal, no murmur, click, rub or gallop GI: soft, non-tender; bowel sounds normal; no masses,  no organomegaly Extremities: 1+ pitting edema is noted. Pulses: 2+ and symmetric Skin: Skin color, texture, turgor normal. No rashes or lesions Lymph nodes: Cervical, supraclavicular, and axillary nodes normal. Neurologic: Oriented x3. Hard of hearing. No focal neurological deficits.  Laboratory Data: Results for orders placed during the hospital encounter of 12/14/13  (from the past 48 hour(s))  CBC WITH DIFFERENTIAL     Status: Abnormal   Collection Time    12/14/13  5:17 PM      Result Value Range   WBC 8.1  4.0 - 10.5 K/uL   RBC 3.35 (*) 4.22 - 5.81 MIL/uL   Hemoglobin 11.2 (*) 13.0 - 17.0 g/dL   HCT 33.3 (*) 39.0 - 52.0 %   MCV 99.4  78.0 - 100.0 fL   MCH 33.4  26.0 - 34.0 pg   MCHC 33.6  30.0 - 36.0 g/dL   RDW 13.9  11.5 - 15.5 %   Platelets 246  150 - 400 K/uL   Neutrophils Relative % 50  43 - 77 %   Neutro Abs 4.1  1.7 - 7.7 K/uL   Lymphocytes Relative 40  12 - 46 %   Lymphs Abs 3.2  0.7 - 4.0 K/uL   Monocytes Relative 7  3 - 12 %   Monocytes Absolute 0.6  0.1 - 1.0 K/uL   Eosinophils Relative 3  0 - 5 %   Eosinophils Absolute 0.2  0.0 - 0.7 K/uL   Basophils Relative 1  0 - 1 %   Basophils Absolute 0.0  0.0 - 0.1 K/uL  BASIC METABOLIC PANEL     Status: Abnormal   Collection Time    12/14/13  5:17 PM      Result Value Range   Sodium 140  137 - 147 mEq/L   Potassium 5.3  3.7 - 5.3 mEq/L   Chloride 103  96 - 112 mEq/L   CO2 27  19 - 32 mEq/L   Glucose, Bld 127 (*) 70 - 99 mg/dL   Thomas 34 (*) 6 - 23 mg/dL   Creatinine, Ser 1.18  0.50 - 1.35 mg/dL   Calcium 9.2  8.4 - 10.5 mg/dL   GFR calc non Af Amer 50 (*) >90 mL/min   GFR calc Af Amer 58 (*) >90 mL/min   Comment: (NOTE)     The eGFR has been calculated using the CKD EPI equation.     This calculation has not been validated in all clinical situations.     eGFR's persistently <90 mL/min signify possible Chronic Kidney     Disease.  TROPONIN I     Status: None   Collection Time    12/14/13  5:17 PM      Result Value Range   Troponin I <0.30  <0.30 ng/mL   Comment:  Due to the release kinetics of cTnI,     a negative result within the first hours     of the onset of symptoms does not rule out     myocardial infarction with certainty.     If myocardial infarction is still suspected,     repeat the test at appropriate intervals.  LACTIC ACID, PLASMA     Status: None     Collection Time    12/14/13  5:17 PM      Result Value Range   Lactic Acid, Venous 1.0  0.5 - 2.2 mmol/L  SAMPLE TO BLOOD BANK     Status: None   Collection Time    12/14/13  5:18 PM      Result Value Range   Blood Bank Specimen BBHLD     Sample Expiration 12/15/2013    OCCULT BLOOD, POC DEVICE     Status: Abnormal   Collection Time    12/14/13  5:53 PM      Result Value Range   Fecal Occult Bld POSITIVE (*) NEGATIVE  URINALYSIS W MICROSCOPIC + REFLEX CULTURE     Status: None   Collection Time    12/14/13  6:21 PM      Result Value Range   Color, Urine YELLOW  YELLOW   APPearance CLEAR  CLEAR   Specific Gravity, Urine 1.025  1.005 - 1.030   pH 6.0  5.0 - 8.0   Glucose, UA NEGATIVE  NEGATIVE mg/dL   Hgb urine dipstick NEGATIVE  NEGATIVE   Bilirubin Urine NEGATIVE  NEGATIVE   Ketones, ur NEGATIVE  NEGATIVE mg/dL   Protein, ur NEGATIVE  NEGATIVE mg/dL   Urobilinogen, UA 0.2  0.0 - 1.0 mg/dL   Nitrite NEGATIVE  NEGATIVE   Leukocytes, UA NEGATIVE  NEGATIVE    Radiology Reports: Ct Abdomen Pelvis W Contrast  12/14/2013   CLINICAL DATA:  Rectal bleeding, dark blood per rectum for 3-4 days, history of C difficile colitis, diverticulosis, hypertension, diabetes, CHF, stroke  EXAM: CT ABDOMEN AND PELVIS WITH CONTRAST  TECHNIQUE: Multidetector CT imaging of the abdomen and pelvis was performed using the standard protocol following bolus administration of intravenous contrast. Sagittal and coronal MPR images reconstructed from axial data set.  CONTRAST:  5m OMNIPAQUE IOHEXOL 300 MG/ML SOLN, 1070mOMNIPAQUE IOHEXOL 300 MG/ML SOLN  COMPARISON:  10/11/2007  FINDINGS: Minimal peripheral fibrosis at right lung base.  Atherosclerotic calcifications aorta, iliac arteries and branch vessels.  Few tiny pancreatic calcifications question representing chronic calcific pancreatitis.  Liver, spleen, pancreas, kidneys, and adrenal glands otherwise normal appearance.  Small splenules adjacent to spleen.   Beam hardening artifacts from right hip prosthesis obscure portions of pelvis.  Minimal prostatic enlargement with unremarkable bladder and visualized ureters.  Stomach and small bowel loops grossly unremarkable.  Diverticulosis of distal descending and sigmoid colon without evidence of acute diverticulitis.  Appendix surgically absent by history.  Scattered motion artifacts.  No mass, adenopathy, free fluid, or inflammatory process.  Bones diffusely demineralized.  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  Distal colonic diverticulosis without evidence of diverticulitis.  No definite acute intra-abdominal or intrapelvic process otherwise identified.   Electronically Signed   By: MaLavonia Dana.D.   On: 12/14/2013 19:29    Electrocardiogram: Sinus rhythm at 73 beats per minute. Left axis deviation. Right bundle branch block is noted. No Q waves. T inversions noted in V1, V2. No concerning ST changes. These changes appear to  be old compared to previous EKG.  Problem List  Principal Problem:   GI bleeding Active Problems:   DM2 (diabetes mellitus, type 2)   Arthritis of both knees   Unspecified hereditary and idiopathic peripheral neuropathy   Cerebellar stroke syndrome   Accelerated hypertension   Assessment: This is a 80 year old, Caucasian male, who presents with rectal bleeding. There is also reports of black stool. This is most likely diverticular. CT scan does not show any other concerning findings. However, patient was on amoxicillin a few weeks ago, for sinusitis and so C. difficile is a possibility as well.  Plan: #1 GI bleeding: This is most likely lower GI bleed. Patient does report some black stool. We will consult gastroenterology. Monitor him closely monitor his hemoglobin closely. Give PPI. Keep him n.p.o. for now. Hold his antiplatelet agents for now  #2 recent diarrhea while on antibiotics: Check stool for C.  difficile. CT scan does not show any evidence for colitis.  #3 anemia likely secondary to acute blood loss: Continue to monitor hemoglobin closely. Transfuse as needed.  #4 history of cerebellar stroke syndrome: He is on aspirin and Plavix. Due to GI bleeding we will hold these agents.  #5 accelerated hypertension: Hydralazine as needed. Blood pressure has been elevated recently, but he was also noted to be orthostatic at his cardiologist's office a few months ago. And, at that time his Hytrin and Lasix was discontinued. Do not treat aggressively at this time. Would resume his oral agents. He does have pitting pedal edema in bilateral lower extremity, but no evidence for pulmonary edema. This is probably due to discontinuation of diuretics a few months ago. This will need to be readdressed in near future  #6 history of diabetes mellitus, type II: Initiate sliding scale coverage. Hold his oral agents for now.   DVT Prophylaxis: SCDs Code Status: CODE STATUS was discussed with the patient in the presence of his niece and nephew. He would like to be DO NOT RESUSCITATE/DNI. Family Communication: Discussed with the patient. His knees and nephew  Disposition Plan: Admit to telemetry   Further management decisions will depend on results of further testing and patient's response to treatment.  [facility]  [facility] Pager [phone]  If 7PM-7AM, please contact night-coverage [email or web address] Password [email or web address]  12/14/2013, 8:22 PM

## 2013-12-15 ENCOUNTER — Encounter (HOSPITAL_COMMUNITY)
Admission: EM | Disposition: A | Discharge status: Nursing Home (Includes ICF) | Payer: Self-pay | Source: Home / Self Care | Attending: Internal Medicine

## 2013-12-15 ENCOUNTER — Encounter (HOSPITAL_COMMUNITY): Payer: Self-pay | Admitting: *Deleted

## 2013-12-15 DIAGNOSIS — I10 Essential (primary) hypertension: Secondary | ICD-10-CM | POA: Diagnosis not present

## 2013-12-15 DIAGNOSIS — D62 Acute posthemorrhagic anemia: Secondary | ICD-10-CM | POA: Diagnosis not present

## 2013-12-15 DIAGNOSIS — K922 Gastrointestinal hemorrhage, unspecified: Secondary | ICD-10-CM

## 2013-12-15 DIAGNOSIS — E119 Type 2 diabetes mellitus without complications: Secondary | ICD-10-CM | POA: Diagnosis not present

## 2013-12-15 HISTORY — PX: COLONOSCOPY: SHX5424

## 2013-12-15 LAB — CBC
HCT: 29.5 % — ABNORMAL LOW (ref 39.0–52.0)
HCT: 31.3 % — ABNORMAL LOW (ref 39.0–52.0)
HEMATOCRIT: 28.6 % — AB (ref 39.0–52.0)
Hemoglobin: 9.9 g/dL — ABNORMAL LOW (ref 13.0–17.0)
Hemoglobin: 9.9 g/dL — ABNORMAL LOW (ref 13.0–17.0)
Hemoglobin: 10.5 g/dL — ABNORMAL LOW (ref 13.0–17.0)
MCH: 33 pg (ref 26.0–34.0)
MCH: 34.1 pg — ABNORMAL HIGH (ref 26.0–34.0)
MCH: 33 pg (ref 26.0–34.0)
MCHC: 33.6 g/dL (ref 30.0–36.0)
MCHC: 34.6 g/dL (ref 30.0–36.0)
MCHC: 33.5 g/dL (ref 30.0–36.0)
MCV: 98.3 fL (ref 78.0–100.0)
MCV: 98.6 fL (ref 78.0–100.0)
MCV: 98.4 fL (ref 78.0–100.0)
PLATELETS: 230 10*3/uL (ref 150–400)
Platelets: 209 10*3/uL (ref 150–400)
Platelets: 191 10*3/uL (ref 150–400)
RBC: 3 MIL/uL — ABNORMAL LOW (ref 4.22–5.81)
RBC: 2.9 MIL/uL — ABNORMAL LOW (ref 4.22–5.81)
RBC: 3.18 MIL/uL — ABNORMAL LOW (ref 4.22–5.81)
RDW: 14 % (ref 11.5–15.5)
RDW: 13.9 % (ref 11.5–15.5)
RDW: 13.7 % (ref 11.5–15.5)
WBC: 5.6 10*3/uL (ref 4.0–10.5)
WBC: 5.2 10*3/uL (ref 4.0–10.5)
WBC: 8 10*3/uL (ref 4.0–10.5)

## 2013-12-15 LAB — GLUCOSE, CAPILLARY
GLUCOSE-CAPILLARY: 110 mg/dL — AB (ref 70–99)
GLUCOSE-CAPILLARY: 107 mg/dL — AB (ref 70–99)
GLUCOSE-CAPILLARY: 114 mg/dL — AB (ref 70–99)
Glucose-Capillary: 108 mg/dL — ABNORMAL HIGH (ref 70–99)
Glucose-Capillary: 207 mg/dL — ABNORMAL HIGH (ref 70–99)

## 2013-12-15 LAB — CLOSTRIDIUM DIFFICILE BY PCR: CDIFFPCR: NEGATIVE

## 2013-12-15 LAB — COMPREHENSIVE METABOLIC PANEL
ALBUMIN: 3.1 g/dL — AB (ref 3.5–5.2)
ALT: 11 U/L (ref 0–53)
AST: 15 U/L (ref 0–37)
Alkaline Phosphatase: 63 U/L (ref 39–117)
BUN: 25 mg/dL — AB (ref 6–23)
CALCIUM: 8.7 mg/dL (ref 8.4–10.5)
CHLORIDE: 105 meq/L (ref 96–112)
CO2: 27 meq/L (ref 19–32)
CREATININE: 1.01 mg/dL (ref 0.50–1.35)
GFR calc Af Amer: 70 mL/min — ABNORMAL LOW (ref >90)
GFR, EST NON AFRICAN AMERICAN: 61 mL/min — AB (ref >90)
Glucose, Bld: 115 mg/dL — ABNORMAL HIGH (ref 70–99)
Potassium: 4.4 mEq/L (ref 3.7–5.3)
Sodium: 141 mEq/L (ref 137–147)
Total Bilirubin: 0.3 mg/dL (ref 0.3–1.2)
Total Protein: 5.7 g/dL — ABNORMAL LOW (ref 6.0–8.3)

## 2013-12-15 LAB — HEMOGLOBIN A1C
Hgb A1c MFr Bld: 6.1 % — ABNORMAL HIGH (ref <5.7)
Mean Plasma Glucose: 128 mg/dL — ABNORMAL HIGH (ref <117)

## 2013-12-15 SURGERY — COLONOSCOPY
Anesthesia: Moderate Sedation

## 2013-12-15 MED ORDER — PEG 3350-KCL-NABCB-NACL-NASULF 236 G PO SOLR
4000.0000 mL | Freq: Once | ORAL | Status: AC
  Administered 2013-12-15: 4000 mL via ORAL
  Filled 2013-12-15: qty 4000

## 2013-12-15 MED ORDER — PANTOPRAZOLE SODIUM 40 MG PO TBEC
40.0000 mg | DELAYED_RELEASE_TABLET | Freq: Every day | ORAL | Status: DC
  Administered 2013-12-16 – 2013-12-17 (×2): 40 mg via ORAL
  Filled 2013-12-15 (×2): qty 1

## 2013-12-15 MED ORDER — MEPERIDINE HCL 50 MG/ML IJ SOLN
INTRAMUSCULAR | Status: AC
  Filled 2013-12-15: qty 1

## 2013-12-15 MED ORDER — HYDRALAZINE HCL 20 MG/ML IJ SOLN
INTRAMUSCULAR | Status: DC | PRN
  Administered 2013-12-15: 5 mg via INTRAVENOUS

## 2013-12-15 MED ORDER — BUTAMBEN-TETRACAINE-BENZOCAINE 2-2-14 % EX AERO
INHALATION_SPRAY | CUTANEOUS | Status: DC | PRN
  Administered 2013-12-15: 2 via TOPICAL

## 2013-12-15 MED ORDER — MIDAZOLAM HCL 5 MG/5ML IJ SOLN
INTRAMUSCULAR | Status: DC | PRN
  Administered 2013-12-15 (×5): 1 mg via INTRAVENOUS

## 2013-12-15 MED ORDER — HYDRALAZINE HCL 20 MG/ML IJ SOLN
INTRAMUSCULAR | Status: AC
  Filled 2013-12-15: qty 1

## 2013-12-15 MED ORDER — SIMETHICONE 40 MG/0.6ML PO SUSP
ORAL | Status: DC | PRN
  Administered 2013-12-15: 15:00:00

## 2013-12-15 MED ORDER — MEPERIDINE HCL 50 MG/ML IJ SOLN
INTRAMUSCULAR | Status: DC | PRN
  Administered 2013-12-15 (×2): 15 mg via INTRAVENOUS

## 2013-12-15 MED ORDER — MIDAZOLAM HCL 5 MG/5ML IJ SOLN
INTRAMUSCULAR | Status: AC
  Filled 2013-12-15: qty 5

## 2013-12-15 NOTE — Op Note (Signed)
COLONOSCOPY AND EGD  PROCEDURE REPORT  PATIENT:  Christian Thomas  MR#:  [id number] Birthdate:  01-Aug-1915, 80 y.o., male Endoscopist:  Dr. Rogene Houston, MD Referred By:  Dr. Rayne Du ref. provider found Procedure Date: 12/15/2013  Procedure: Colonoscopy and EGD.  Indications:  Patient is 80 year old Caucasian male who presents with acute GI bleed and significant drop in his H&H. Hemoglobin 3 months ago was 13.4. It was 11.2 on admission and 9.9 this morning. Patient is on Plavix low-dose aspirin and etodolac.            Informed Consent:  The risks, benefits, alternatives & imponderables which include, but are not limited to, bleeding, infection, perforation, drug reaction and potential missed lesion have been reviewed.  The potential for biopsy, lesion removal, esophageal dilation, etc. have also been discussed.  Questions have been answered.  All parties agreeable.  Please see history & physical in medical record for more information.  Medications:  Demerol 30 mg IV Versed 5 mg IV Cetacaine spray topically for oropharyngeal anesthesia    COLONOSCOPY Description of procedure:  After a digital rectal exam was performed, that colonoscope was advanced from the anus through the rectum and colon to the area of the cecum, ileocecal valve and appendiceal orifice. The cecum was deeply intubated. These structures were well-seen and photographed for the record. From the level of the cecum and ileocecal valve, the scope was slowly and cautiously withdrawn. The mucosal surfaces were carefully surveyed utilizing scope tip to flexion to facilitate fold flattening as needed. The scope was pulled down into the rectum where a thorough exam including retroflexion was performed.  Findings:  Excellent prep. Diffuse mucosal pigmentation. Multiple diverticula and sigmoid colon and few more proximally but without stigmata of bleed. Normal rectal mucosa and small hemorrhoids below the dentate  line.   Therapeutic/Diagnostic Maneuvers Performed:  None  Complications:  None  Cecal Withdrawal Time:  7 minutes  EGD  Description of procedure:  The endoscope was introduced through the mouth and advanced to the second portion of the duodenum without difficulty or limitations. The mucosal surfaces were surveyed very carefully during advancement of the scope and upon withdrawal.  Findings:  Esophagus:  Mucosa of the esophagus was normal. GE junction was unremarkable. GEJ:  44 cm Hiatus:  46 cm Stomach:  Stomach was empty and distended very well with insufflation. Folds in the proximal stomach were normal. Examination of mucosa at gastric body was normal. There was prepyloric erythema with erosion and friable mucosa at  pyloric channel with erosion but no ulceration. Pyloric channel however was patent. Angularis fundus and cardia were unremarkable. Duodenum:  Normal bulbar and post bulbar mucosa.  Therapeutic/Diagnostic Maneuvers Performed:  None  Impression:  Colonoscopy findings; Diffuse changes of melanosis coli. Pancolonic diverticulosis but most of the diverticula are at sigmoid colon but none with stigmata of bleed. External hemorrhoids.  EGD findings; Small sliding hiatal hernia. Erosive antral gastritis with pyloric channel inflammation and erosion but no ulceration.  Comment; Suspect colonic diverticular bleed. If bleeding recurs will consider GI bleeding scan.  Recommendations:  Advance diet. Will monitor for another 24-36 hours. Hold antiplatelet agents for at least 5 days. No NSAIDs. H. pylori serology.  REHMAN,NAJEEB U  12/15/2013 4:01 PM  CC: Dr. Rubbie Battiest, MD & Dr. Rayne Du ref. provider found

## 2013-12-15 NOTE — Consult Note (Signed)
Referring Provider: No ref. provider found Primary Care Physician:  Rubbie Battiest, MD Primary Gastroenterologist:  Dr. Barney Drain, MD  Reason for Consultation:   Rectal bleeding.  HPI:  Patient is 80 year old Caucasian male who began to notice blood with his bowel movements about 4 days ago. He describes it to be small to moderate amount of blood. He has had problems with constipation he believes he's been doing better having bowel movement daily or every other day. Yesterday he passed at least half a cup of blood following which she was seen in Dr. Lendon Ka office and sent over to the ER for evaluation. Patient's hemoglobin on admission was 11.2 g and this morning is down to 9.9 g. He has not passed any more blood since last night. He denies nausea vomiting heartburn or abdominal pain. He has had good appetite. Review of the systems is positive for tremors to both hands which get more pronounced when he is nervous and he also complains of burning in both feet. He was seen by Dr. Wolfgang Phoenix earlier this month and felt to have neuropathy and begun on tramadol. He also complains of bilateral knee pain. He uses cane or walker to ambulate since he had surgery for right hip fracture in December 2013. Plavix aspirin and etodolic are on hold.  Patient underwent colonoscopy by Dr. Oneida Alar in [place] 20/10 for constipation and in decreasing caliber of his stools. He was found to have C. difficile colitis and responded to Flagyl. He also had a flexible sigmoidoscopy in March 2011 for hematochezia felt to be secondary to internal hemorrhoids. He also had anorectal manometry at [facility] in April 2011 and was within normal limits. [facility] has [facility]. He is retired from [employer] he worked for many years. He quit cigarette smoking over 30 years ago and does not drink alcohol. He lost his wife 2 years ago. He does not have any children. His niece Ms. [name]  Ward and her husband are involved in his care.   Past Medical History  Diagnosis Date  . Diverticulosis   . Mixed hyperlipidemia   . Gout   . Clostridium difficile colitis 12/2008  . Chronic constipation   . Essential hypertension, benign   . Type 2 diabetes mellitus   . Venous insufficiency   . Benign essential tremor   . Macular degeneration   . Diabetic neuropathy   . Dysrhythmia   . CHF (congestive heart failure)   . Stroke   . Cerebellar stroke syndrome   . DDD (degenerative disc disease), lumbar   . BPH (benign prostatic hypertrophy)     Past Surgical History  Procedure Laterality Date  . Hernia repair    . Hemorrhoid surgery    . Appendectomy    . Colonoscopy      NL TI, pan-TICS, sml IH, CDIFF COLITIS  . Sigmoidoscopy      Internal hemorrhoids  . Hip arthroplasty  11/07/2012    Procedure: ARTHROPLASTY BIPOLAR HIP;  Surgeon: Mauri Pole, MD;  Location: [facility];  Service: Orthopedics;  Laterality: Right;    Prior to Admission medications   Medication Sig Start Date End Date Taking? Authorizing Provider  acetaminophen (TYLENOL) 500 MG tablet Take 1,000 mg by mouth 2 (two) times daily.   Yes Historical Provider, MD  allopurinol (ZYLOPRIM) 300 MG tablet TAKE 1 TABLET BY MOUTH ONCE DAILY FOR GOUT. 11/07/13  Yes Mikey Kirschner, MD  aspirin EC 81 MG  tablet Take 81 mg by mouth daily.   Yes Historical Provider, MD  clopidogrel (PLAVIX) 75 MG tablet Take 75 mg by mouth daily.   Yes Historical Provider, MD  enalapril (VASOTEC) 5 MG tablet Take 1 tablet (5 mg total) by mouth 2 (two) times daily. TAKE (1) TABLET BY MOUTH EACH MORNING. 09/26/13  Yes Satira Sark, MD  etodolac (LODINE) 300 MG capsule Take 300 mg by mouth 2 (two) times daily.   Yes Historical Provider, MD  ferrous sulfate 325 (65 FE) MG tablet Take 325 mg by mouth 3 (three) times daily.   Yes Historical Provider, MD  finasteride (PROSCAR) 5 MG tablet Take 5 mg by mouth daily.   Yes Historical Provider, MD   loratadine (CLARITIN) 10 MG tablet Take 10 mg by mouth daily.   Yes Historical Provider, MD  lovastatin (MEVACOR) 40 MG tablet Take 40 mg by mouth at bedtime.   Yes Historical Provider, MD  meclizine (ANTIVERT) 25 MG tablet Take 25 mg by mouth every 6 (six) hours as needed for dizziness.   Yes Historical Provider, MD  metFORMIN (GLUCOPHAGE) 500 MG tablet Take by mouth 2 (two) times daily.   Yes Historical Provider, MD  omeprazole (PRILOSEC) 20 MG capsule Take 20 mg by mouth daily.   Yes Historical Provider, MD  polyethylene glycol (MIRALAX / GLYCOLAX) packet Take 17 g by mouth 3 (three) times daily.   Yes Historical Provider, MD  senna (SENOKOT) 8.6 MG TABS tablet Take 2 tablets by mouth at bedtime.   Yes Historical Provider, MD  Specialty Vitamins Products (ICAPS LUTEIN-ZEAXANTHIN PO) Take 2 tablets by mouth daily.   Yes Historical Provider, MD  traMADol (ULTRAM) 50 MG tablet Take 50 mg by mouth daily with supper.   Yes Historical Provider, MD  ALPRAZolam (XANAX) 0.25 MG tablet Take 1 tablet (0.25 mg total) by mouth 2 (two) times daily as needed for anxiety. 10/08/13 10/08/14  Mikey Kirschner, MD  diphenhydrAMINE (BENADRYL) 25 MG tablet Take 25 mg by mouth every 6 (six) hours as needed for itching.    Historical Provider, MD  INSTA-GLUCOSE 77.4 % GEL GIVE 1 TUBE BY MOUTH IF B/S <60; WAIT 15 MINUTES THEN RECHECK. 11/09/13   Mikey Kirschner, MD  lactulose (CHRONULAC) 10 GM/15ML solution Take 15 g by mouth daily as needed. constipation    Historical Provider, MD  SORE THROAT LIQD USE 5 SPRAYS IN THROAT EVERY 2 HOURS AS NEEDED FOR SORE THROAT. 11/16/13   Kathyrn Drown, MD    Current Facility-Administered Medications  Medication Dose Route Frequency Provider Last Rate Last Dose  . 0.9 %  sodium chloride infusion   Intravenous Continuous Bonnielee Haff, MD 50 mL/hr at 12/14/13 2229    . acetaminophen (TYLENOL) tablet 650 mg  650 mg Oral Q6H PRN Bonnielee Haff, MD       Or  . acetaminophen  (TYLENOL) suppository 650 mg  650 mg Rectal Q6H PRN Bonnielee Haff, MD      . albuterol (PROVENTIL) (2.5 MG/3ML) 0.083% nebulizer solution 2.5 mg  2.5 mg Nebulization Q2H PRN Bonnielee Haff, MD      . allopurinol (ZYLOPRIM) tablet 300 mg  300 mg Oral Daily Bonnielee Haff, MD   300 mg at 12/15/13 0900  . ALPRAZolam Duanne Moron) tablet 0.25 mg  0.25 mg Oral BID PRN Bonnielee Haff, MD      . enalapril (VASOTEC) tablet 5 mg  5 mg Oral BID Bonnielee Haff, MD   5 mg at 12/15/13 0900  .  finasteride (PROSCAR) tablet 5 mg  5 mg Oral Daily Bonnielee Haff, MD   5 mg at 12/15/13 0930  . hydrALAZINE (APRESOLINE) injection 5 mg  5 mg Intravenous Q6H PRN Bonnielee Haff, MD      . insulin aspart (novoLOG) injection 0-9 Units  0-9 Units Subcutaneous Q4H Bonnielee Haff, MD      . meclizine (ANTIVERT) tablet 12.5 mg  12.5 mg Oral TID PRN Bonnielee Haff, MD      . ondansetron (ZOFRAN) tablet 4 mg  4 mg Oral Q6H PRN Bonnielee Haff, MD       Or  . ondansetron (ZOFRAN) injection 4 mg  4 mg Intravenous Q6H PRN Bonnielee Haff, MD      . pantoprazole (PROTONIX) injection 40 mg  40 mg Intravenous Q12H Bonnielee Haff, MD   40 mg at 12/15/13 0900  . polyethylene glycol (GoLYTELY/NuLYTELY) solution 4,000 mL  4,000 mL Oral Once Rogene Houston, MD      . sodium chloride 0.9 % injection 3 mL  3 mL Intravenous Q12H Bonnielee Haff, MD   3 mL at 12/14/13 2213    Allergies as of 12/14/2013 - Review Complete 12/14/2013  Allergen Reaction Noted  . Morphine and related  11/05/2012  . Other  11/05/2012    Family History  Problem Relation Age of Onset  . Cancer Mother     Breast    History   Social History  . Marital Status: Married    Spouse Name: N/A    Number of Children: N/A  . Years of Education: N/A   Occupational History  . Retired     [employer]   Social History Main Topics  . Smoking status: Former Smoker -- 0.50 packs/day for 54 years    Types: Cigarettes    Quit date: 11/19/1992  . Smokeless  tobacco: Never Used  . Alcohol Use: Yes     Comment: Rare  . Drug Use: No  . Sexual Activity: No   Other Topics Concern  . Not on file   Social History Narrative  . No narrative on file    Review of Systems: See HPI, otherwise normal ROS  Physical Exam: Temp:  [97.8 F (36.6 C)-98.4 F (36.9 C)] 97.8 F (36.6 C) (01/31 0619) Pulse Rate:  [65-74] 67 (01/31 0619) Resp:  [20] 20 (01/31 0619) BP: (168-232)/(70-111) 182/81 mmHg (01/31 0619) SpO2:  [96 %-100 %] 96 % (01/31 0619) Weight:  [181 lb (82.1 kg)-184 lb (83.462 kg)] 181 lb (82.1 kg) (01/30 2133) Last BM Date: 12/14/13  Pleasant well-developed thin elderly Caucasian male who is in no acute distress. He has hearing impairment. Conjunctiva was pink. Sclerae nonicteric. Oropharyngeal mucosa is normal. No neck masses or thyromegaly noted. Cardiac exam regular rhythm normal S1 and S2. He has grade 3/6 long systolic murmur at LLSB. Lungs are clear to auscultation. Abdomen symmetrical. Bowel sounds are normal. No bruits noted. Abdomen soft and nontender without organomegaly or masses. Rectal examination deferred as he had one emergency room. He has trace edema around ankles.  Intake/Output from previous day: 01/30 0701 - 01/31 0700 In: -  Out: 1850 [Urine:1850] Intake/Output this shift: Total I/O In: -  Out: 200 [Urine:200]  Lab Results:  Recent Labs  12/14/13 2210 12/15/13 0440 12/15/13 0942  WBC 6.6 5.6 5.2  HGB 10.5* 9.9* 9.9*  HCT 30.7* 29.5* 28.6*  PLT 209 209 191   BMET  Recent Labs  12/14/13 1717 12/15/13 0440  NA 140 141  K  5.3 4.4  CL 103 105  CO2 27 27  GLUCOSE 127* 115*  BUN 34* 25*  CREATININE 1.18 1.01  CALCIUM 9.2 8.7   LFT  Recent Labs  12/15/13 0440  PROT 5.7*  ALBUMIN 3.1*  AST 15  ALT 11  ALKPHOS 63  BILITOT 0.3   PT/INR  Recent Labs  12/14/13 2210  LABPROT 12.6  INR 0.96   Hepatitis Panel No results found for this basename: HEPBSAG, HCVAB, HEPAIGM, HEPBIGM,   in the last 72 hours  Studies/Results: Ct Abdomen Pelvis W Contrast  12/14/2013   CLINICAL DATA:  Rectal bleeding, dark blood per rectum for 3-4 days, history of C difficile colitis, diverticulosis, hypertension, diabetes, CHF, stroke  EXAM: CT ABDOMEN AND PELVIS WITH CONTRAST  TECHNIQUE: Multidetector CT imaging of the abdomen and pelvis was performed using the standard protocol following bolus administration of intravenous contrast. Sagittal and coronal MPR images reconstructed from axial data set.  CONTRAST:  20mL OMNIPAQUE IOHEXOL 300 MG/ML SOLN, 194mL OMNIPAQUE IOHEXOL 300 MG/ML SOLN  COMPARISON:  10/11/2007  FINDINGS: Minimal peripheral fibrosis at right lung base.  Atherosclerotic calcifications aorta, iliac arteries and branch vessels.  Few tiny pancreatic calcifications question representing chronic calcific pancreatitis.  Liver, spleen, pancreas, kidneys, and adrenal glands otherwise normal appearance.  Small splenules adjacent to spleen.  Beam hardening artifacts from right hip prosthesis obscure portions of pelvis.  Minimal prostatic enlargement with unremarkable bladder and visualized ureters.  Stomach and small bowel loops grossly unremarkable.  Diverticulosis of distal descending and sigmoid colon without evidence of acute diverticulitis.  Appendix surgically absent by history.  Scattered motion artifacts.  No mass, adenopathy, free fluid, or inflammatory process.  Bones diffusely demineralized.  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  Distal colonic diverticulosis without evidence of diverticulitis.  No definite acute intra-abdominal or intrapelvic process otherwise identified.   Electronically Signed   By: Lavonia Dana M.D.   On: 12/14/2013 19:29    Assessment;  Patient is  very pleasant 80 year old Caucasian male resident of [place] who presents with 4 day history of passing dark blood with bowel movements.  Hemoglobin has dropped from 11.2 g on admission to 9.9 today. Hemoglobin was 13.43 months ago. Patient is on Plavix aspirin and NSAID. He does not have any GI symptoms to suggest upper GI bleed. Doubt that mildly elevated BUN on admission was indicated upper GI bleed. Last colonoscopy was in February 2010 revealing pancolonic diverticulosis and C. difficile colitis. Suspect colonic diverticula bleed; however source could be small bowel or even upper GI tract. There is significant risk of rebleeding irrespective of source of blood loss given the patient is on two antiplatelet agents and NSAIDs. Will need to reassess the need for to continue these medications. Patient and his niece Caryl Asp are interested in finding the source of blood loss and therefore will proceed with colonoscopy to be followed by EGD if colonoscopy is negative.   Recommendations; Patient will be prepped for colonoscopy to be performed later this afternoon and if it is negative would be followed by EGD. Would recommend stopping or decreasing dose of etodolac if feasible.     LOS: 1 day   REHMAN,NAJEEB U  12/15/2013, 10:50 AM

## 2013-12-15 NOTE — Progress Notes (Signed)
[facility] PROGRESS NOTE  TREW SUNDE UUV:[id number] DOB: 12/25/1914 DOA: 12/14/2013 PCP: Rubbie Battiest, MD    Code Status: DO NOT RESUSCITATE Family Communication: discussed with daughter Disposition Plan: to be determined   Consultants:  Gastroenterology  Procedures:  Colonoscopy pending; possible EGD.  Antibiotics:  None   HPI/Subjective: The patient is very hard of hearing, but he denies abdominal pain or bowel movement this morning. He denies nausea vomiting. His daughter is in the room and questions were answered.  Objective: Filed Vitals:   12/15/13 1432  BP: 168/74  Pulse: 67  Temp: 97.6 F (36.4 C)  Resp: 20   oxygen saturation 99%.  Intake/Output Summary (Last 24 hours) at 12/15/13 1436 Last data filed at 12/15/13 0900  Gross per 24 hour  Intake      0 ml  Output   2050 ml  Net  -2050 ml   Filed Weights   12/14/13 2133  Weight: 82.1 kg (181 lb)    Exam:   General:  pleasant, alert hard of hearing 43 are old man laying in bed, in no acute distress.  Cardiovascular: S1, S2, with a 4-7/4 systolic murmur.  Respiratory: decreased breath sounds in the bases, clear anteriorly.  Abdomen: positive bowel sounds, soft, nontender, nondistended.  Musculoskeletal: arthritic changes noted in his hands and his knees, without acute hot red joints. Trace of pedal edema bilaterally. Pedal pulses barely palpable.  Neurologic/psychiatric: He is hard of hearing but is alert and oriented to himself, his daughter, in place. Pleasant affect.   Data Reviewed: Basic Metabolic Panel:  Recent Labs Lab 12/14/13 1717 12/15/13 0440  NA 140 141  K 5.3 4.4  CL 103 105  CO2 27 27  GLUCOSE 127* 115*  BUN 34* 25*  CREATININE 1.18 1.01  CALCIUM 9.2 8.7   Liver Function Tests:  Recent Labs Lab 12/15/13 0440  AST 15  ALT 11  ALKPHOS 63  BILITOT 0.3  PROT 5.7*  ALBUMIN 3.1*   No results found for this basename: LIPASE, AMYLASE,  in the last 168  hours No results found for this basename: AMMONIA,  in the last 168 hours CBC:  Recent Labs Lab 12/14/13 1605 12/14/13 1717 12/14/13 2210 12/15/13 0440 12/15/13 0942  WBC  --  8.1 6.6 5.6 5.2  NEUTROABS  --  4.1  --   --   --   HGB 10.5* 11.2* 10.5* 9.9* 9.9*  HCT  --  33.3* 30.7* 29.5* 28.6*  MCV  --  99.4 98.4 98.3 98.6  PLT  --  246 209 209 191   Cardiac Enzymes:  Recent Labs Lab 12/14/13 1717  TROPONINI <0.30   BNP (last 3 results) No results found for this basename: PROBNP,  in the last 8760 hours CBG:  Recent Labs Lab 12/14/13 2257 12/15/13 0232 12/15/13 0423 12/15/13 0733 12/15/13 1147  GLUCAP 106* 110* 107* 114* 108*    Recent Results (from the past 240 hour(s))  CLOSTRIDIUM DIFFICILE BY PCR     Status: None   Collection Time    12/15/13 11:40 AM      Result Value Range Status   C difficile by pcr NEGATIVE  NEGATIVE Final     Studies: Ct Abdomen Pelvis W Contrast  12/14/2013   CLINICAL DATA:  Rectal bleeding, dark blood per rectum for 3-4 days, history of C difficile colitis, diverticulosis, hypertension, diabetes, CHF, stroke  EXAM: CT ABDOMEN AND PELVIS WITH CONTRAST  TECHNIQUE: Multidetector CT imaging of the abdomen and pelvis  was performed using the standard protocol following bolus administration of intravenous contrast. Sagittal and coronal MPR images reconstructed from axial data set.  CONTRAST:  92mL OMNIPAQUE IOHEXOL 300 MG/ML SOLN, 154mL OMNIPAQUE IOHEXOL 300 MG/ML SOLN  COMPARISON:  10/11/2007  FINDINGS: Minimal peripheral fibrosis at right lung base.  Atherosclerotic calcifications aorta, iliac arteries and branch vessels.  Few tiny pancreatic calcifications question representing chronic calcific pancreatitis.  Liver, spleen, pancreas, kidneys, and adrenal glands otherwise normal appearance.  Small splenules adjacent to spleen.  Beam hardening artifacts from right hip prosthesis obscure portions of pelvis.  Minimal prostatic enlargement with  unremarkable bladder and visualized ureters.  Stomach and small bowel loops grossly unremarkable.  Diverticulosis of distal descending and sigmoid colon without evidence of acute diverticulitis.  Appendix surgically absent by history.  Scattered motion artifacts.  No mass, adenopathy, free fluid, or inflammatory process.  Bones diffusely demineralized.  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  Distal colonic diverticulosis without evidence of diverticulitis.  No definite acute intra-abdominal or intrapelvic process otherwise identified.   Electronically Signed   By: Lavonia Dana M.D.   On: 12/14/2013 19:29    Scheduled Meds: . allopurinol  300 mg Oral Daily  . enalapril  5 mg Oral BID  . finasteride  5 mg Oral Daily  . insulin aspart  0-9 Units Subcutaneous Q4H  . meperidine      . midazolam      . pantoprazole (PROTONIX) IV  40 mg Intravenous Q12H  . sodium chloride  3 mL Intravenous Q12H   Continuous Infusions: . sodium chloride 50 mL/hr at 12/14/13 2229    Assessment:  Principal Problem:   GI bleeding Active Problems:   Accelerated hypertension   Anemia due to blood loss, acute   DM2 (diabetes mellitus, type 2)   Arthritis of both knees   Unspecified hereditary and idiopathic peripheral neuropathy   Cerebellar stroke syndrome    1. GI bleeding in the setting of antiplatelet therapy with Plavix and aspirin and NSAID. The patient has a history of chronic constipation. He has a history of internal hemorrhoids per sigmoidoscopy in 2011. He had pandiverticulosis per colonoscopy in 2010. The etiology of the current GI bleeding is unknown, but melena may be indicative of an upper GI source. He has no abdominal tenderness. We'll continue IV Protonix every 12 hours. Dr. Laural Golden has been consulted and plans on a colonoscopy and possibly an upper endoscopy this afternoon. Plavix and aspirin are on hold. Etodolac has been  discontinued for now.  Acute blood loss anemia. His hemoglobin was 11.2 on admission, but has fallen to 9.9. He is hemodynamically stable. Blood transfusion is not indicated currently. We'll continue to monitor.  Mild dehydration/azotemia. We'll continue gentle IV fluids.  Malignant hypertension. He is on enalapril 5 mg twice a day. When necessary hydralazine has been ordered. Consider increasing the dose of enalapril or adding amlodipine over the next 24 hours if his blood pressure still greater than 376 systolically.  Diabetes mellitus. Metformin is on hold for now. Continue sliding scale NovoLog. His capillary blood glucose is ranging in the low 100s.  History of cerebellar stroke syndrome. He is treated with both aspirin and Plavix. Both are currently on hold.     Plan: 1. Colonoscopy plan today plus/minus EGD per Dr. Laural Golden. 2. Continue monitoring his CBC. 3. Continue supportive treatment. 4. Consider increasing the dose of enalapril versus adding another agent if his blood pressure continues to be  greater than 170 consistently.  Time spent: 35 minutes.    [facility] Pager [phone]. If 7PM-7AM, please contact night-coverage at [email or web address], password [facility] 12/15/2013, 2:36 PM  LOS: 1 day

## 2013-12-16 DIAGNOSIS — I10 Essential (primary) hypertension: Secondary | ICD-10-CM

## 2013-12-16 DIAGNOSIS — D62 Acute posthemorrhagic anemia: Secondary | ICD-10-CM | POA: Diagnosis not present

## 2013-12-16 DIAGNOSIS — E119 Type 2 diabetes mellitus without complications: Secondary | ICD-10-CM

## 2013-12-16 DIAGNOSIS — K922 Gastrointestinal hemorrhage, unspecified: Secondary | ICD-10-CM

## 2013-12-16 LAB — CBC
HCT: 27.5 % — ABNORMAL LOW (ref 39.0–52.0)
HCT: 30.9 % — ABNORMAL LOW (ref 39.0–52.0)
HEMOGLOBIN: 9.4 g/dL — AB (ref 13.0–17.0)
HEMOGLOBIN: 10.5 g/dL — AB (ref 13.0–17.0)
MCH: 33.5 pg (ref 26.0–34.0)
MCH: 33.7 pg (ref 26.0–34.0)
MCHC: 34.2 g/dL (ref 30.0–36.0)
MCHC: 34 g/dL (ref 30.0–36.0)
MCV: 97.9 fL (ref 78.0–100.0)
MCV: 99 fL (ref 78.0–100.0)
PLATELETS: 233 10*3/uL (ref 150–400)
Platelets: 208 10*3/uL (ref 150–400)
RBC: 2.81 MIL/uL — ABNORMAL LOW (ref 4.22–5.81)
RBC: 3.12 MIL/uL — ABNORMAL LOW (ref 4.22–5.81)
RDW: 13.8 % (ref 11.5–15.5)
RDW: 14.1 % (ref 11.5–15.5)
WBC: 6.1 10*3/uL (ref 4.0–10.5)
WBC: 6.9 10*3/uL (ref 4.0–10.5)

## 2013-12-16 LAB — GLUCOSE, CAPILLARY
GLUCOSE-CAPILLARY: 106 mg/dL — AB (ref 70–99)
GLUCOSE-CAPILLARY: 139 mg/dL — AB (ref 70–99)
GLUCOSE-CAPILLARY: 178 mg/dL — AB (ref 70–99)
GLUCOSE-CAPILLARY: 85 mg/dL (ref 70–99)
GLUCOSE-CAPILLARY: 89 mg/dL (ref 70–99)
Glucose-Capillary: 102 mg/dL — ABNORMAL HIGH (ref 70–99)
Glucose-Capillary: 122 mg/dL — ABNORMAL HIGH (ref 70–99)

## 2013-12-16 LAB — BASIC METABOLIC PANEL
BUN: 17 mg/dL (ref 6–23)
CHLORIDE: 108 meq/L (ref 96–112)
CO2: 25 mEq/L (ref 19–32)
Calcium: 8.3 mg/dL — ABNORMAL LOW (ref 8.4–10.5)
Creatinine, Ser: 0.98 mg/dL (ref 0.50–1.35)
GFR calc non Af Amer: 67 mL/min — ABNORMAL LOW (ref >90)
GFR, EST AFRICAN AMERICAN: 78 mL/min — AB (ref >90)
GLUCOSE: 98 mg/dL (ref 70–99)
POTASSIUM: 4 meq/L (ref 3.7–5.3)
Sodium: 142 mEq/L (ref 137–147)

## 2013-12-16 MED ORDER — INSULIN ASPART 100 UNIT/ML ~~LOC~~ SOLN: 0.0000 [IU] | Freq: Every day | SUBCUTANEOUS | Status: DC

## 2013-12-16 MED ORDER — INSULIN ASPART 100 UNIT/ML ~~LOC~~ SOLN: 0.0000 [IU] | Freq: Three times a day (TID) | SUBCUTANEOUS | Status: DC

## 2013-12-16 NOTE — Progress Notes (Signed)
Subjective; Patient passed moderate to large amount of fresh blood this morning according to his niece Caryl Asp. He didn't experience abdominal pain nausea or vomiting.  Objective; BP 137/56  Pulse 74  Temp(Src) 98.5 F (36.9 C) (Oral)  Resp 20  Ht 6\' 2"  (1.88 m)  Wt 181 lb (82.1 kg)  BMI 23.23 kg/m2  SpO2 97% Patient is sleeping.  Lab data; WBC 6.1. H&H 9.4 and 27.5 and platelet count 208K. H. pylori serology is pending.  Assessment; GI bleed felt to be secondary to colonic diverticulosis. Patient underwent colonoscopy followed by esophagogastroduodenoscopy. He had multiple diverticula at sigmoid colon with few Motrin rest of the colon but none with stigmata of bleed. EGD revealed hiatal hernia and erosive gastritis and pyloric channel inflammation but no ulcer. He had another episode of bleeding this morning but he is hemodynamically stable. Will continue to monitor. If he has more episodes of bleeding and hemoglobin keeps dropping and there is need for transfusion will consider to be colonoscopy

## 2013-12-16 NOTE — Progress Notes (Signed)
[facility] PROGRESS NOTE  Christian Thomas WCH:[id number] DOB: 1915/08/12 DOA: 12/14/2013 PCP: Rubbie Battiest, MD    Code Status: DO NOT RESUSCITATE Family Communication: discussed with daughter Disposition Plan: discharge home once improved   Consultants:  Gastroenterology, Dr. Laural Golden  Procedures:  Colonoscopy findings;  Diffuse changes of melanosis coli.  Pancolonic diverticulosis but most of the diverticula are at sigmoid colon but none with stigmata of bleed.  External hemorrhoids.   EGD findings;  Small sliding hiatal hernia.  Erosive antral gastritis with pyloric channel inflammation and erosion but no ulceration.   Comment;  Suspect colonic diverticular bleed.  If bleeding recurs will consider GI bleeding scan.   Antibiotics:  None   HPI/Subjective: Patient had recurrence of bright red blood in stool this morning.  Denies any abdominal pain  Objective: Filed Vitals:   12/16/13 1420  BP: 146/54  Pulse: 68  Temp: 98.2 F (36.8 C)  Resp: 20   oxygen saturation 99%.  Intake/Output Summary (Last 24 hours) at 12/16/13 1558 Last data filed at 12/16/13 1200  Gross per 24 hour  Intake    480 ml  Output   1450 ml  Net   -970 ml   Filed Weights   12/14/13 2133  Weight: 82.1 kg (181 lb)    Exam:   General:  pleasant, alert hard of hearing 24 are old man laying in bed, in no acute distress.  Cardiovascular: S1, S2, with a 3-5/3 systolic murmur.  Respiratory: decreased breath sounds in the bases, clear anteriorly.  Abdomen: positive bowel sounds, soft, nontender, nondistended.  Musculoskeletal: arthritic changes noted in his hands and his knees, without acute hot red joints. Trace of pedal edema bilaterally. Pedal pulses barely palpable.  Neurologic/psychiatric: He is hard of hearing but is alert and oriented to himself, his daughter, in place. Pleasant affect.   Data Reviewed: Basic Metabolic Panel:  Recent Labs Lab 12/14/13 1717  12/15/13 0440 12/16/13 0509  NA 140 141 142  K 5.3 4.4 4.0  CL 103 105 108  CO2 27 27 25   GLUCOSE 127* 115* 98  BUN 34* 25* 17  CREATININE 1.18 1.01 0.98  CALCIUM 9.2 8.7 8.3*   Liver Function Tests:  Recent Labs Lab 12/15/13 0440  AST 15  ALT 11  ALKPHOS 63  BILITOT 0.3  PROT 5.7*  ALBUMIN 3.1*   No results found for this basename: LIPASE, AMYLASE,  in the last 168 hours No results found for this basename: AMMONIA,  in the last 168 hours CBC:  Recent Labs Lab 12/14/13 1717 12/14/13 2210 12/15/13 0440 12/15/13 0942 12/15/13 1705 12/16/13 0509  WBC 8.1 6.6 5.6 5.2 8.0 6.1  NEUTROABS 4.1  --   --   --   --   --   HGB 11.2* 10.5* 9.9* 9.9* 10.5* 9.4*  HCT 33.3* 30.7* 29.5* 28.6* 31.3* 27.5*  MCV 99.4 98.4 98.3 98.6 98.4 97.9  PLT 246 209 209 191 230 208   Cardiac Enzymes:  Recent Labs Lab 12/14/13 1717  TROPONINI <0.30   BNP (last 3 results) No results found for this basename: PROBNP,  in the last 8760 hours CBG:  Recent Labs Lab 12/15/13 1936 12/16/13 12/16/13 0402 12/16/13 0756 12/16/13 1123  GLUCAP 207* 85 89 102* 178*    Recent Results (from the past 240 hour(s))  CLOSTRIDIUM DIFFICILE BY PCR     Status: None   Collection Time    12/15/13 11:40 AM      Result Value Range Status  C difficile by pcr NEGATIVE  NEGATIVE Final     Studies: Ct Abdomen Pelvis W Contrast  12/14/2013   CLINICAL DATA:  Rectal bleeding, dark blood per rectum for 3-4 days, history of C difficile colitis, diverticulosis, hypertension, diabetes, CHF, stroke  EXAM: CT ABDOMEN AND PELVIS WITH CONTRAST  TECHNIQUE: Multidetector CT imaging of the abdomen and pelvis was performed using the standard protocol following bolus administration of intravenous contrast. Sagittal and coronal MPR images reconstructed from axial data set.  CONTRAST:  49mL OMNIPAQUE IOHEXOL 300 MG/ML SOLN, 145mL OMNIPAQUE IOHEXOL 300 MG/ML SOLN  COMPARISON:  10/11/2007  FINDINGS: Minimal peripheral  fibrosis at right lung base.  Atherosclerotic calcifications aorta, iliac arteries and branch vessels.  Few tiny pancreatic calcifications question representing chronic calcific pancreatitis.  Liver, spleen, pancreas, kidneys, and adrenal glands otherwise normal appearance.  Small splenules adjacent to spleen.  Beam hardening artifacts from right hip prosthesis obscure portions of pelvis.  Minimal prostatic enlargement with unremarkable bladder and visualized ureters.  Stomach and small bowel loops grossly unremarkable.  Diverticulosis of distal descending and sigmoid colon without evidence of acute diverticulitis.  Appendix surgically absent by history.  Scattered motion artifacts.  No mass, adenopathy, free fluid, or inflammatory process.  Bones diffusely demineralized.  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  Distal colonic diverticulosis without evidence of diverticulitis.  No definite acute intra-abdominal or intrapelvic process otherwise identified.   Electronically Signed   By: Lavonia Dana M.D.   On: 12/14/2013 19:29    Scheduled Meds: . allopurinol  300 mg Oral Daily  . enalapril  5 mg Oral BID  . finasteride  5 mg Oral Daily  . insulin aspart  0-9 Units Subcutaneous Q4H  . pantoprazole  40 mg Oral Daily  . sodium chloride  3 mL Intravenous Q12H   Continuous Infusions: . sodium chloride 50 mL/hr at 12/14/13 2229    Assessment:  Principal Problem:   GI bleeding Active Problems:   DM2 (diabetes mellitus, type 2)   Arthritis of both knees   Unspecified hereditary and idiopathic peripheral neuropathy   Cerebellar stroke syndrome   Accelerated hypertension   Anemia due to blood loss, acute    1. GI bleeding in the setting of antiplatelet therapy with Plavix and aspirin and NSAID. The patient has a history of chronic constipation. He has a history of internal hemorrhoids per sigmoidoscopy in 2011. He had  pandiverticulosis per colonoscopy in 2010. He has no abdominal tenderness. We'll continue IV Protonix every 12 hours. Patient seen by Dr. Laural Golden and underwent colonoscopy/EGD with results as above. Possible diverticular bleeding.  It is felt that his ongoing bleeding may be residual effect from Plavix. Will continue to monitor.  Acute blood loss anemia. His hemoglobin was 11.2 on admission, but has fallen to 9.4. He is hemodynamically stable. Blood transfusion is not indicated currently. We'll continue to monitor.  Mild dehydration/azotemia. Improved with IV fluids.  Malignant hypertension. He is on enalapril 5 mg twice a day. When necessary hydralazine has been ordered. Consider increasing the dose of enalapril or adding amlodipine over the next 24 hours if his blood pressure still greater than [id number] systolically.  Diabetes mellitus. Metformin is on hold for now. Continue sliding scale NovoLog. His capillary blood glucose is ranging in the low 100s.  History of cerebellar stroke syndrome. He is treated with both aspirin and Plavix. Both are currently on hold.   Time spent: 35 minutes.    Onetta Spainhower  [facility] Pager [phone]. If 7PM-7AM, please contact night-coverage at [email or web address], password [facility] 12/16/2013, 3:58 PM  LOS: 2 days

## 2013-12-16 NOTE — Progress Notes (Signed)
Utilization review Completed Gurtha Picker RN BSN   

## 2013-12-17 ENCOUNTER — Encounter (HOSPITAL_COMMUNITY): Payer: Self-pay | Admitting: Internal Medicine

## 2013-12-17 DIAGNOSIS — K922 Gastrointestinal hemorrhage, unspecified: Secondary | ICD-10-CM | POA: Diagnosis not present

## 2013-12-17 DIAGNOSIS — E119 Type 2 diabetes mellitus without complications: Secondary | ICD-10-CM | POA: Diagnosis not present

## 2013-12-17 DIAGNOSIS — I10 Essential (primary) hypertension: Secondary | ICD-10-CM | POA: Diagnosis not present

## 2013-12-17 DIAGNOSIS — D62 Acute posthemorrhagic anemia: Secondary | ICD-10-CM | POA: Diagnosis not present

## 2013-12-17 LAB — CBC
HCT: 27.7 % — ABNORMAL LOW (ref 39.0–52.0)
Hemoglobin: 9.5 g/dL — ABNORMAL LOW (ref 13.0–17.0)
MCH: 33.6 pg (ref 26.0–34.0)
MCHC: 34.3 g/dL (ref 30.0–36.0)
MCV: 97.9 fL (ref 78.0–100.0)
PLATELETS: 213 10*3/uL (ref 150–400)
RBC: 2.83 MIL/uL — ABNORMAL LOW (ref 4.22–5.81)
RDW: 14 % (ref 11.5–15.5)
WBC: 6.5 10*3/uL (ref 4.0–10.5)

## 2013-12-17 LAB — GLUCOSE, CAPILLARY
Glucose-Capillary: 116 mg/dL — ABNORMAL HIGH (ref 70–99)
Glucose-Capillary: 102 mg/dL — ABNORMAL HIGH (ref 70–99)

## 2013-12-17 LAB — OCCULT BLOOD, POC DEVICE: Fecal Occult Bld: POSITIVE — AB

## 2013-12-17 LAB — H. PYLORI ANTIBODY, IGG: H PYLORI IGG: 2.53 {ISR} — AB

## 2013-12-17 MED ORDER — ENALAPRIL MALEATE 5 MG PO TABS: 5.0000 mg | ORAL_TABLET | Freq: Two times a day (BID) | ORAL | Status: DC

## 2013-12-17 NOTE — Discharge Summary (Addendum)
Patient seen and examined.  Above note reviewed.  Patient has clinically improved.  No further bloody bowel movements.  Aspirin/plavix on hold until follow up with primary care doctor.  Will need repeat cbc at the time of pcp follow up.  He is tolerating a solid diet.  He has been cleared for discharge by GI.  MEMON,JEHANZEB  Addendum:  Patient's enalapril is meant to be taken as 5mg  po bid, as he had been taking prior to admission.  MEMON,JEHANZEB

## 2013-12-17 NOTE — Progress Notes (Signed)
Subjective; Patient has no complaints. He has very good appetite. He denies abdominal pain. He hasn't passed any more blood per rectum since yesterday morning. He did have a bowel movement yesterday afternoon when he passed small amount of brown liquid stool.  Objective; BP 164/61  Pulse 72  Temp(Src) 97.8 F (36.6 C) (Oral)  Resp 18  Ht 6\' 2"  (1.88 m)  Wt 181 lb (82.1 kg)  BMI 23.23 kg/m2  SpO2 94% Patient is alert and in no acute distress. Abdomen is soft and nontender without organomegaly or masses.  Lab data; WBC 6.5, H&H 9.5 and 27.7 and platelet count is 213K. H&H yesterday morning was 9.4 and 27.5 and 10.5 and 30.9 yesterday afternoon. H. pylori serology is pending  Assessment; GI bleed secondary to colonic diverticulosis. Patient is status post colonoscopy and EGD 2 days ago. He had another episode of bleeding yesterday which was day after his procedure but none in 24 hours. H&H is low but stable and without need for blood transfusion. If he does well rest of the day he could go home later this afternoon. Patient will need to follow with Dr. Mickie Hillier before and her platelet agent presumed. Patient should not go back on NSAID therapy.

## 2013-12-17 NOTE — Care Management Note (Signed)
    Page 1 of 1   12/17/2013     11:35:29 AM   CARE MANAGEMENT NOTE 12/17/2013  Patient:  Christian, Thomas   Account Number:  [id number]  Date Initiated:  12/17/2013  Documentation initiated by:  Theophilus Kinds  Subjective/Objective Assessment:   Pt admitted from [facility] with gi bleeding. Pt will return to facility at discharge.     Action/Plan:   Pt discharged today. CSW to arrange discharge to facility.   Anticipated DC Date:  12/17/2013   Anticipated DC Plan:  ASSISTED LIVING / REST HOME  In-house referral  Clinical Social Worker      DC Planning Services  CM consult      Choice offered to / List presented to:             Status of service:  Completed, signed off Medicare Important Message given?  YES (If response is "NO", the following Medicare IM given date fields will be blank) Date Medicare IM given:  12/17/2013 Date Additional Medicare IM given:    Discharge Disposition:  ASSISTED LIVING  Per UR Regulation:    If discussed at Long Length of Stay Meetings, dates discussed:    Comments:  12/17/13 M[place], RN BSN CM

## 2013-12-17 NOTE — Evaluation (Signed)
Physical Therapy Evaluation Patient Details Name: Christian Thomas MRN: [id number] DOB: 1915-10-30 Today's Date: 12/17/2013 Time: 2202-5427 PT Time Calculation (min): 31 min  PT Assessment / Plan / Recommendation History of Present Illness  Pt is a 80 year old resident of [facility] who is admitted with a GI bleed.  He reports feeling very well  today.  Clinical Impression  This is a delightful pt who is found to be at functional baseline.  No further PT should be needed.    PT Assessment  Patent does not need any further PT services    Follow Up Recommendations  No PT follow up    Does the patient have the potential to tolerate intense rehabilitation      Barriers to Discharge        Equipment Recommendations  None recommended by PT    Recommendations for Other Services     Frequency      Precautions / Restrictions Precautions Precautions: Fall Restrictions Weight Bearing Restrictions: No   Pertinent Vitals/Pain       Mobility  Bed Mobility Overal bed mobility: Modified Independent Transfers Overall transfer level: Modified independent Equipment used: Rolling walker (2 wheeled) Ambulation/Gait Ambulation/Gait assistance: Modified independent (Device/Increase time) Ambulation Distance (Feet): 450 Feet Assistive device: Rolling walker (2 wheeled) Gait velocity interpretation: at or above normal speed for age/gender    Exercises     PT Diagnosis:    PT Problem List:   PT Treatment Interventions:       PT Goals(Current goals can be found in the care plan section) Acute Rehab PT Goals PT Goal Formulation: No goals set, d/c therapy  Visit Information  Last PT Received On: 12/17/13 History of Present Illness: Pt is a 80 year old resident of [facility] who is admitted with a GI bleed.  He reports feeling very well  today.       Prior Functioning  Home Living Family/patient expects to be discharged to:: Assisted living Home Equipment: Gilford Rile - 2 wheels;Cane - single  point Prior Function Level of Independence: Independent with assistive device(s) Communication Communication: HOH    Cognition  Cognition Arousal/Alertness: Awake/alert Behavior During Therapy: WFL for tasks assessed/performed Overall Cognitive Status: Within Functional Limits for tasks assessed    Extremity/Trunk Assessment Lower Extremity Assessment Lower Extremity Assessment: Overall WFL for tasks assessed (OA in both knees)   Balance Balance Overall balance assessment: No apparent balance deficits (not formally assessed)  End of Session PT - End of Session Equipment Utilized During Treatment: Gait belt Activity Tolerance: Patient tolerated treatment well Patient left: in chair;with call bell/phone within reach;with family/visitor present  GP     Christian Thomas L 12/17/2013, 9:22 AM

## 2013-12-17 NOTE — Discharge Summary (Signed)
Physician Discharge Summary  Christian Thomas NWG:[id number] DOB: 1915/08/30 DOA: 12/14/2013  PCP: Rubbie Battiest, MD  Admit date: 12/14/2013 Discharge date: 12/17/2013  Time spent: 40 minutes  Recommendations for Outpatient Follow-up:  PCP Dr. Calton Golds in 1 week for evaluation of resumption of plavix and recommend CBC to track Hg.   Returning to assisted living.   Discharge Diagnoses:    GI bleeding Anemia due to blood loss, acute Accelerated hypertension   DM2 (diabetes mellitus, type 2)   Arthritis of both knees   Unspecified hereditary and idiopathic peripheral neuropathy   Cerebellar stroke syndrome      Discharge Condition: stable  Diet recommendation: carb modified  Filed Weights   12/14/13 2133  Weight: 82.1 kg (181 lb)    History of present illness:  Christian Thomas is a 79 y.o. male who lives in an assisted-living facility and is hard of hearing with a past medical history of hypertension, diabetes, neuropathy and who was in his usual state of health about 3-4 days prior to presentation on 12/14/13, when he started noticing [name] stools, and small amounts of blood in the stool. He was having frequent bowel movements. And, then the intensity of bleeding got worse over the previous couple days. He was seeing more blood in his stool. Patient denied any abdominal discomfort, however, did have some cramping whenever he would have a bowel movement. He had lost a few pounds in the previous 2-3 weeks. He reported that he loses weight and then gains it back. No nausea, vomiting. He did get heartburn occasionally, but none recently. No fever. No chills. It appeared that he was treated with amoxicillin for sinusitis earlier this month and subsequently started having diarrhea about 3-4 times a day, but at that time he did not see any blood in the stool. It appeared based on previous records that he had a flexible sigmoidoscopy in March of 2011 for evaluation of constipation, which showed  diverticulosis. No masses were noted. Small to moderate internal hemorrhoids were seen. Subsequently, he was sent to [facility] for anorectal manometric. Patient does have a history of chronic constipation. Based on subsequent notes it appears that Manometry showed normal colonic transit and anorectal manometric. Biofeedback was recommended but the patient was not interested. Patient denied any dizziness, lightheadedness. No syncopal episodes.   Hospital Course:  1. GI bleeding in the setting of antiplatelet therapy with Plavix and aspirin and NSAID. The patient has a history of chronic constipation. He has a history of internal hemorrhoids per sigmoidoscopy in 2011. He had pandiverticulosis per colonoscopy in 2010. He has no abdominal tenderness. Admitted and provided with  IV Protonix every 12 hours. Patient seen by Dr. Laural Golden and underwent colonoscopy/EGD with results yielding diffuse changes of melanosis coli. Pancolonic diverticulosis but most of the diverticula are at sigmoid colon but none with stigmata of bleed. External hemorrhoids. Small sliding hiatal hernia.  Erosive antral gastritis with pyloric channel inflammation and erosion but no ulceration. Possible diverticular bleeding. It is felt that his ongoing bleeding may be residual effect from Plavix. Plavix and asa and NSAID discontinued at discharge. Will need follow up with PCP 1 week for evaluation of Hg and possible resumption of plavix.   Acute blood loss anemia. His hemoglobin was 11.2 on admission, but has fallen to 9.4. He remained hemodynamically stable. Blood transfusion is not indicated. No BRBPR in 24 hours. Recommend cbc 1 week for evaluation of hg.    Mild dehydration/azotemia.resolved with IV fluids. At  discharge patient taking po fluids without problem.   Malignant hypertension. He is on enalapril 5 mg twice a day. Fair control during hospitalization  Diabetes mellitus. Metformin held on admission. hgA1c 6.1. Continue  home regimen at discharge History of cerebellar stroke syndrome. He is treated with both aspirin and Plavix. Will discontinue these at discharge due to GI bleed. Will need to see PCP before resuming plavix   Procedures: Colonoscopy findings;  Diffuse changes of melanosis coli.  Pancolonic diverticulosis but most of the diverticula are at sigmoid colon but none with stigmata of bleed.  External hemorrhoids.  EGD findings;  Small sliding hiatal hernia.  Erosive antral gastritis with pyloric channel inflammation and erosion but no ulceration.  Comment;  Suspect colonic diverticular bleed.  If bleeding recurs will consider GI bleeding scan.      Consultations:  Dr. Laural Golden GI  Discharge Exam: Filed Vitals:   12/17/13 0850  BP:   Pulse: 72  Temp:   Resp: 18    General: well nourished NAD calm  Cardiovascular: RRR +faint murmur no gallup no rub. Trace LE Respiratory: normal effort BS clear bilaterally somewhat distant particularly in bases. No wheeze no rhochi Abdomen: soft +BS non-tender to palpation  Discharge Instructions   Future Appointments Provider Department Dept Phone   12/19/2013 10:30 AM Mikey Kirschner, MD [place] [phone]       Medication List    STOP taking these medications       aspirin EC 81 MG tablet     clopidogrel 75 MG tablet  Commonly known as:  PLAVIX     etodolac 300 MG capsule  Commonly known as:  LODINE      TAKE these medications       acetaminophen 500 MG tablet  Commonly known as:  TYLENOL  Take 1,000 mg by mouth 2 (two) times daily.     allopurinol 300 MG tablet  Commonly known as:  ZYLOPRIM  TAKE 1 TABLET BY MOUTH ONCE DAILY FOR GOUT.     ALPRAZolam 0.25 MG tablet  Commonly known as:  XANAX  Take 1 tablet (0.25 mg total) by mouth 2 (two) times daily as needed for anxiety.     diphenhydrAMINE 25 MG tablet  Commonly known as:  BENADRYL  Take 25 mg by mouth every 6 (six) hours as needed for itching.      enalapril 5 MG tablet  Commonly known as:  VASOTEC  Take 1 tablet (5 mg total) by mouth 2 (two) times daily. TAKE (1) TABLET BY MOUTH EACH MORNING.     ferrous sulfate 325 (65 FE) MG tablet  Take 325 mg by mouth 3 (three) times daily.     finasteride 5 MG tablet  Commonly known as:  PROSCAR  Take 5 mg by mouth daily.     ICAPS LUTEIN-ZEAXANTHIN PO  Take 2 tablets by mouth daily.     INSTA-GLUCOSE 77.4 % Gel  Generic drug:  Dextrose  GIVE 1 TUBE BY MOUTH IF B/S <60; WAIT 15 MINUTES THEN RECHECK.     lactulose 10 GM/15ML solution  Commonly known as:  CHRONULAC  Take 15 g by mouth daily as needed. constipation     loratadine 10 MG tablet  Commonly known as:  CLARITIN  Take 10 mg by mouth daily.     lovastatin 40 MG tablet  Commonly known as:  MEVACOR  Take 40 mg by mouth at bedtime.     meclizine 25 MG tablet  Commonly known  as:  ANTIVERT  Take 25 mg by mouth every 6 (six) hours as needed for dizziness.     metFORMIN 500 MG tablet  Commonly known as:  GLUCOPHAGE  Take by mouth 2 (two) times daily.     omeprazole 20 MG capsule  Commonly known as:  PRILOSEC  Take 20 mg by mouth daily.     polyethylene glycol packet  Commonly known as:  MIRALAX / GLYCOLAX  Take 17 g by mouth 3 (three) times daily.     senna 8.6 MG Tabs tablet  Commonly known as:  SENOKOT  Take 2 tablets by mouth at bedtime.     SORE THROAT 1.4 % Liqd  Generic drug:  phenol  USE 5 SPRAYS IN THROAT EVERY 2 HOURS AS NEEDED FOR SORE THROAT.     traMADol 50 MG tablet  Commonly known as:  ULTRAM  Take 50 mg by mouth daily with supper.       Allergies  Allergen Reactions  . Morphine And Related   . Other     Opioids        Follow-up Information   Follow up with Harlow Asa, MD. Schedule an appointment as soon as possible for a visit in 1 week. (will need CBC monitor hg and evaluate for reumption of plavix )    Specialty:  Family Medicine   Contact information:   [address] [place] [address] [phone]        The results of significant diagnostics from this hospitalization (including imaging, microbiology, ancillary and laboratory) are listed below for reference.    Significant Diagnostic Studies: Ct Abdomen Pelvis W Contrast  12/14/2013   CLINICAL DATA:  Rectal bleeding, dark blood per rectum for 3-4 days, history of C difficile colitis, diverticulosis, hypertension, diabetes, CHF, stroke  EXAM: CT ABDOMEN AND PELVIS WITH CONTRAST  TECHNIQUE: Multidetector CT imaging of the abdomen and pelvis was performed using the standard protocol following bolus administration of intravenous contrast. Sagittal and coronal MPR images reconstructed from axial data set.  CONTRAST:  60mL OMNIPAQUE IOHEXOL 300 MG/ML SOLN, OMNIPAQUE IOHEXOL 300 MG/ML SOLN  COMPARISON:  10/11/2007  FINDINGS: Minimal peripheral fibrosis at right lung base.  Atherosclerotic calcifications aorta, iliac arteries and branch vessels.  Few tiny pancreatic calcifications question representing chronic calcific pancreatitis.  Liver, spleen, pancreas, kidneys, and adrenal glands otherwise normal appearance.  Small splenules adjacent to spleen.  Beam hardening artifacts from right hip prosthesis obscure portions of pelvis.  Minimal prostatic enlargement with unremarkable bladder and visualized ureters.  Stomach and small bowel loops grossly unremarkable.  Diverticulosis of distal descending and sigmoid colon without evidence of acute diverticulitis.  Appendix surgically absent by history.  Scattered motion artifacts.  No mass, adenopathy, free fluid, or inflammatory process.  Bones diffusely demineralized.  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  Distal colonic diverticulosis without evidence of diverticulitis.  No definite acute intra-abdominal or intrapelvic process otherwise identified.   Electronically Signed   By: Ulyses Southward M.D.    On: 12/14/2013 19:29    Microbiology: Recent Results (from the past 240 hour(s))  CLOSTRIDIUM DIFFICILE BY PCR     Status: None   Collection Time    12/15/13 11:40 AM      Result Value Range Status   C difficile by pcr NEGATIVE  NEGATIVE Final     Labs: Basic Metabolic Panel:  Recent Labs Lab 12/14/13 1717 12/15/13 0440 12/16/13 0509  NA  140 141 142  K 5.3 4.4 4.0  CL 103 105 108  CO2 27 27 25   GLUCOSE 127* 115* 98  BUN 34* 25* 17  CREATININE 1.18 1.01 0.98  CALCIUM 9.2 8.7 8.3*   Liver Function Tests:  Recent Labs Lab 12/15/13 0440  AST 15  ALT 11  ALKPHOS 63  BILITOT 0.3  PROT 5.7*  ALBUMIN 3.1*   No results found for this basename: LIPASE, AMYLASE,  in the last 168 hours No results found for this basename: AMMONIA,  in the last 168 hours CBC:  Recent Labs Lab 12/14/13 1717  12/15/13 0942 12/15/13 1705 12/16/13 0509 12/16/13 1615 12/17/13 0534  WBC 8.1  < > 5.2 8.0 6.1 6.9 6.5  NEUTROABS 4.1  --   --   --   --   --   --   HGB 11.2*  < > 9.9* 10.5* 9.4* 10.5* 9.5*  HCT 33.3*  < > 28.6* 31.3* 27.5* 30.9* 27.7*  MCV 99.4  < > 98.6 98.4 97.9 99.0 97.9  PLT 246  < > 191 230 208 233 213  < > = values in this interval not displayed. Cardiac Enzymes:  Recent Labs Lab 12/14/13 1717  TROPONINI <0.30   BNP: BNP (last 3 results) No results found for this basename: PROBNP,  in the last 8760 hours CBG:  Recent Labs Lab 12/16/13 1123 12/16/13 1612 12/16/13 2136 12/16/13 2353 12/17/13 0747  GLUCAP 178* 106* 139* 122* 116*       Signed:  Dyanne Carrel M  [facility] 12/17/2013, 10:43 AM

## 2013-12-17 NOTE — Clinical Social Work Psychosocial (Signed)
Clinical Social Work Department BRIEF PSYCHOSOCIAL ASSESSMENT 12/17/2013  Patient:  Christian Thomas, Christian Thomas     Account Number:  [id number]     Admit date:  12/14/2013  Clinical Social Worker:  Christian Thomas Date/Time:  12/17/2013 11:06 AM  Referred by:  CSW  Date Referred:  12/17/2013 Referred for  ALF Placement   Other Referral:   Interview type:  Patient Other interview type:   niece- Christian Thomas    PSYCHOSOCIAL DATA Living Status:  FACILITY Admitted from facility:  [facility] Level of care:  Assisted Living Primary support name:  Christian Thomas Primary support relationship to patient:  FAMILY Degree of support available:   supportive    CURRENT CONCERNS Current Concerns  Post-Acute Placement   Other Concerns:    SOCIAL WORK ASSESSMENT / PLAN CSW met with pt and pt's niece/HCPOA Christian Thomas at bedside. Pt alert and oriented and reports he is a resident at [facility]. Pt's next of kin is his niece Christian Thomas who visits often at facility. At baseline, pt requires very little assistance with ADLs. He ambulates using a cane or walker. Pt assessed by PT today and no follow up recommended. Pt indicates being at [facility] is "second best to home." He really appears to enjoy the activities and interaction at ALF. He is able to get out some as well. Per [name] at facility, okay to return. Confirms above information. Pt ready for d/c today. Facility aware. CSW will fax d/c summary when completed and send FL2 with pt. Christian Thomas requests to provide transportation.   Assessment/plan status:  Psychosocial Support/Ongoing Assessment of Needs Other assessment/ plan:   Information/referral to community resources:   [facility]    PATIENT'S/FAMILY'S RESPONSE TO PLAN OF CARE: Pt and Christian Thomas report very positive feelings regarding return to [facility] today. CSW will facilitate transfer today.       Christian Thomas, [place]

## 2013-12-18 ENCOUNTER — Other Ambulatory Visit: Payer: Self-pay | Admitting: Family Medicine

## 2013-12-18 NOTE — Progress Notes (Signed)
Patient discharged back to [facility] today,instructions on discharge ,and medications were given to caretaker,who verbalized understanding. Vital signs stable.No c/o pain or discomfort noted. Accompanied by staff to an awaiting vehicle.

## 2013-12-18 NOTE — Progress Notes (Signed)
UR chart review completed.  

## 2013-12-19 ENCOUNTER — Ambulatory Visit: Payer: Medicare Other | Admitting: Family Medicine

## 2013-12-20 ENCOUNTER — Ambulatory Visit (INDEPENDENT_AMBULATORY_CARE_PROVIDER_SITE_OTHER): Payer: Medicare Other | Admitting: Family Medicine

## 2013-12-20 ENCOUNTER — Encounter: Payer: Self-pay | Admitting: Family Medicine

## 2013-12-20 VITALS — BP 104/64 | Ht 74.0 in | Wt 184.2 lb

## 2013-12-20 DIAGNOSIS — R58 Hemorrhage, not elsewhere classified: Secondary | ICD-10-CM | POA: Diagnosis not present

## 2013-12-20 LAB — POCT HEMOGLOBIN: Hemoglobin: 8.8 g/dL — AB (ref 14.1–18.1)

## 2013-12-20 NOTE — Progress Notes (Signed)
   Subjective:    Patient ID: Christian Thomas, male    DOB: [birth year], 80 y.o.   MRN: [id number]  HPI Patient is here today for a hospitalization follow up visit. He arrives for an extremely protracted visit with his relatives. He has multiple concerns.  Was seen in the hospital. Had a colonoscopy. This revealed diverticulosis which was felt to be the source of his bleeding. Also he had gastritis on endoscopy. He is advised to avoid anti-inflammatories and the future.  Patient states that Dr. Melony Overly said that he was going to call me. I stepped out of the room and spoke with Dr. [place] during this visit. He recommends no antiplatelet therapy for wheezing couple weeks. Anna bleeding persist may need further workup such as a bleeding study. This is discussed with the patient.  Patient has another hemoglobin today which has dropped further to 8.8.   Patient was admitted to [facility] on 12/14/13 for GI bleeding. He was told to follow up with his PCP in about 1 week. States he noticed some rectal bleeding yesterday when he had a bowel movement. He states it has gotten a lot better than before.   All medicines are assessed and clarify. Review of Systems    no chest pain no back pain no abdominal pain no change in bowel habits ROS otherwise negative Objective:   Physical Exam Alert no significant distress blood pressure improved to 120/78 on repeat. H&T normal. Lungs clear. Heart rare rhythm. Abdomen benign. Rectal exam heme faintly positive stool. Ankles without edema.       Assessment & Plan:  Impression #1 status post hospitalization for GI bleed. #2 anemia presumably iron deficient with recent blood loss. #3 likely low iron stores discussed. #4 hypertension stable discuss. #5 evidence of ongoing GI bleed. We'll need to watch hemoglobin closely discussed at length. Plan easily 40 minutes spent with patient specialist studying chart talk with family etc. Check hemoglobin and on Monday. Maintain 3 times a  day iron. Use MiraLAX appropriate. Avoid aspirin and Plavix for a least a couple weeks. Recheck in one week. Warning signs discussed. WSL

## 2013-12-21 DIAGNOSIS — Z7982 Long term (current) use of aspirin: Secondary | ICD-10-CM | POA: Diagnosis not present

## 2013-12-21 DIAGNOSIS — K2960 Other gastritis without bleeding: Secondary | ICD-10-CM | POA: Diagnosis not present

## 2013-12-21 DIAGNOSIS — K6389 Other specified diseases of intestine: Secondary | ICD-10-CM | POA: Diagnosis not present

## 2013-12-21 DIAGNOSIS — Z7902 Long term (current) use of antithrombotics/antiplatelets: Secondary | ICD-10-CM | POA: Diagnosis not present

## 2013-12-21 DIAGNOSIS — D62 Acute posthemorrhagic anemia: Secondary | ICD-10-CM | POA: Diagnosis not present

## 2013-12-21 DIAGNOSIS — K5730 Diverticulosis of large intestine without perforation or abscess without bleeding: Secondary | ICD-10-CM | POA: Diagnosis not present

## 2013-12-21 DIAGNOSIS — I10 Essential (primary) hypertension: Secondary | ICD-10-CM | POA: Diagnosis not present

## 2013-12-21 DIAGNOSIS — M6281 Muscle weakness (generalized): Secondary | ICD-10-CM | POA: Diagnosis not present

## 2013-12-21 DIAGNOSIS — Z8673 Personal history of transient ischemic attack (TIA), and cerebral infarction without residual deficits: Secondary | ICD-10-CM | POA: Diagnosis not present

## 2013-12-21 DIAGNOSIS — E1149 Type 2 diabetes mellitus with other diabetic neurological complication: Secondary | ICD-10-CM | POA: Diagnosis not present

## 2013-12-21 DIAGNOSIS — E1142 Type 2 diabetes mellitus with diabetic polyneuropathy: Secondary | ICD-10-CM | POA: Diagnosis not present

## 2013-12-21 DIAGNOSIS — R262 Difficulty in walking, not elsewhere classified: Secondary | ICD-10-CM | POA: Diagnosis not present

## 2013-12-21 DIAGNOSIS — M1710 Unilateral primary osteoarthritis, unspecified knee: Secondary | ICD-10-CM | POA: Diagnosis not present

## 2013-12-21 LAB — GLUCOSE, CAPILLARY: GLUCOSE-CAPILLARY: 91 mg/dL (ref 70–99)

## 2013-12-24 DIAGNOSIS — I10 Essential (primary) hypertension: Secondary | ICD-10-CM | POA: Diagnosis not present

## 2013-12-24 DIAGNOSIS — E1142 Type 2 diabetes mellitus with diabetic polyneuropathy: Secondary | ICD-10-CM | POA: Diagnosis not present

## 2013-12-24 DIAGNOSIS — K2960 Other gastritis without bleeding: Secondary | ICD-10-CM | POA: Diagnosis not present

## 2013-12-24 DIAGNOSIS — K5730 Diverticulosis of large intestine without perforation or abscess without bleeding: Secondary | ICD-10-CM | POA: Diagnosis not present

## 2013-12-24 DIAGNOSIS — E1149 Type 2 diabetes mellitus with other diabetic neurological complication: Secondary | ICD-10-CM | POA: Diagnosis not present

## 2013-12-24 DIAGNOSIS — M1710 Unilateral primary osteoarthritis, unspecified knee: Secondary | ICD-10-CM | POA: Diagnosis not present

## 2013-12-24 DIAGNOSIS — D649 Anemia, unspecified: Secondary | ICD-10-CM | POA: Diagnosis not present

## 2013-12-24 DIAGNOSIS — D62 Acute posthemorrhagic anemia: Secondary | ICD-10-CM | POA: Diagnosis not present

## 2013-12-25 ENCOUNTER — Telehealth: Payer: Self-pay | Admitting: *Deleted

## 2013-12-25 NOTE — Telephone Encounter (Signed)
Discussed with Sharyn Lull at [facility] that Dr. Richardson Landry reviewed Chase's bloodwork and that his numbers were better and to keep on same dose of iron.

## 2013-12-26 DIAGNOSIS — E1142 Type 2 diabetes mellitus with diabetic polyneuropathy: Secondary | ICD-10-CM | POA: Diagnosis not present

## 2013-12-26 DIAGNOSIS — K2960 Other gastritis without bleeding: Secondary | ICD-10-CM | POA: Diagnosis not present

## 2013-12-26 DIAGNOSIS — E1159 Type 2 diabetes mellitus with other circulatory complications: Secondary | ICD-10-CM | POA: Diagnosis not present

## 2013-12-26 DIAGNOSIS — M1710 Unilateral primary osteoarthritis, unspecified knee: Secondary | ICD-10-CM | POA: Diagnosis not present

## 2013-12-26 DIAGNOSIS — D62 Acute posthemorrhagic anemia: Secondary | ICD-10-CM | POA: Diagnosis not present

## 2013-12-26 DIAGNOSIS — E1149 Type 2 diabetes mellitus with other diabetic neurological complication: Secondary | ICD-10-CM | POA: Diagnosis not present

## 2013-12-26 DIAGNOSIS — K5730 Diverticulosis of large intestine without perforation or abscess without bleeding: Secondary | ICD-10-CM | POA: Diagnosis not present

## 2013-12-27 ENCOUNTER — Ambulatory Visit (INDEPENDENT_AMBULATORY_CARE_PROVIDER_SITE_OTHER): Payer: Medicare Other | Admitting: Family Medicine

## 2013-12-27 ENCOUNTER — Encounter: Payer: Self-pay | Admitting: Family Medicine

## 2013-12-27 VITALS — BP 110/68 | Ht 74.0 in | Wt 184.4 lb

## 2013-12-27 DIAGNOSIS — K2960 Other gastritis without bleeding: Secondary | ICD-10-CM | POA: Diagnosis not present

## 2013-12-27 DIAGNOSIS — D62 Acute posthemorrhagic anemia: Secondary | ICD-10-CM

## 2013-12-27 DIAGNOSIS — M170 Bilateral primary osteoarthritis of knee: Secondary | ICD-10-CM

## 2013-12-27 DIAGNOSIS — IMO0002 Reserved for concepts with insufficient information to code with codable children: Secondary | ICD-10-CM

## 2013-12-27 DIAGNOSIS — K922 Gastrointestinal hemorrhage, unspecified: Secondary | ICD-10-CM

## 2013-12-27 DIAGNOSIS — I10 Essential (primary) hypertension: Secondary | ICD-10-CM | POA: Diagnosis not present

## 2013-12-27 DIAGNOSIS — K5730 Diverticulosis of large intestine without perforation or abscess without bleeding: Secondary | ICD-10-CM | POA: Diagnosis not present

## 2013-12-27 DIAGNOSIS — E1149 Type 2 diabetes mellitus with other diabetic neurological complication: Secondary | ICD-10-CM | POA: Diagnosis not present

## 2013-12-27 DIAGNOSIS — E1142 Type 2 diabetes mellitus with diabetic polyneuropathy: Secondary | ICD-10-CM | POA: Diagnosis not present

## 2013-12-27 DIAGNOSIS — N400 Enlarged prostate without lower urinary tract symptoms: Secondary | ICD-10-CM | POA: Diagnosis not present

## 2013-12-27 DIAGNOSIS — M1710 Unilateral primary osteoarthritis, unspecified knee: Secondary | ICD-10-CM | POA: Diagnosis not present

## 2013-12-27 NOTE — Progress Notes (Signed)
   Subjective:    Patient ID: Christian Thomas, male    DOB: Apr 25, 1915, 80 y.o.   MRN: [id number]  HPI  Patient arrives for a follow up on rectal bleeding. Patient reports that he has seen no more blood in his stools. Patient also having pain in left wrist.  Also progressive pain in both knees.  Also reports discomfort coming back into the feet. Patient improved with capsaicin cream before.  Injections of knees generally help.  We did a hemoglobin earlier this week with improvement of numbers.  No obvious constipation with the iron.  Review of Systems No headache no chest pain no abdominal pain no noticeable blood in stool. No weakness ROS otherwise    Objective:   Physical Exam   Alert moderate malaise. Dorsal left wrist tenderness. No deformity. Abdominal exam benign. Lungs clear. Heart regular in rhythm. H&T normal.  Procedure note patient was draped prepped and anesthetized. Both knee joints were injected with Celestone and Xylocaine.     Assessment & Plan:  Impression #1 hypertension good control. #2 anemia improving. #3 GI bleed stable. #4 left wrist tendinitis discussed. #5 bilateral sensory neuropathy discussed plan reduce iron tablets 2 twice a day. Warning signs discussed carefully. Maintain same medications otherwise. Resume capsaicin cream twice a day to feet. Left wrist also. WSL

## 2013-12-31 ENCOUNTER — Other Ambulatory Visit: Payer: Self-pay | Admitting: Family Medicine

## 2013-12-31 DIAGNOSIS — K5730 Diverticulosis of large intestine without perforation or abscess without bleeding: Secondary | ICD-10-CM | POA: Diagnosis not present

## 2013-12-31 DIAGNOSIS — D62 Acute posthemorrhagic anemia: Secondary | ICD-10-CM | POA: Diagnosis not present

## 2013-12-31 DIAGNOSIS — M1710 Unilateral primary osteoarthritis, unspecified knee: Secondary | ICD-10-CM | POA: Diagnosis not present

## 2013-12-31 DIAGNOSIS — E1142 Type 2 diabetes mellitus with diabetic polyneuropathy: Secondary | ICD-10-CM | POA: Diagnosis not present

## 2013-12-31 DIAGNOSIS — E1149 Type 2 diabetes mellitus with other diabetic neurological complication: Secondary | ICD-10-CM | POA: Diagnosis not present

## 2013-12-31 DIAGNOSIS — K2960 Other gastritis without bleeding: Secondary | ICD-10-CM | POA: Diagnosis not present

## 2014-01-03 DIAGNOSIS — M1710 Unilateral primary osteoarthritis, unspecified knee: Secondary | ICD-10-CM

## 2014-01-03 DIAGNOSIS — K5730 Diverticulosis of large intestine without perforation or abscess without bleeding: Secondary | ICD-10-CM | POA: Diagnosis not present

## 2014-01-03 DIAGNOSIS — E1142 Type 2 diabetes mellitus with diabetic polyneuropathy: Secondary | ICD-10-CM | POA: Diagnosis not present

## 2014-01-03 DIAGNOSIS — E1149 Type 2 diabetes mellitus with other diabetic neurological complication: Secondary | ICD-10-CM

## 2014-01-03 DIAGNOSIS — K2960 Other gastritis without bleeding: Secondary | ICD-10-CM

## 2014-01-03 DIAGNOSIS — D62 Acute posthemorrhagic anemia: Secondary | ICD-10-CM

## 2014-01-07 ENCOUNTER — Other Ambulatory Visit: Payer: Self-pay | Admitting: *Deleted

## 2014-01-07 ENCOUNTER — Emergency Department (HOSPITAL_COMMUNITY)
Admission: EM | Admit: 2014-01-07 | Discharge: 2014-01-07 | Disposition: A | Discharge status: Home / Self Care | Payer: Medicare Other | Attending: Emergency Medicine | Admitting: Emergency Medicine

## 2014-01-07 ENCOUNTER — Telehealth: Payer: Self-pay | Admitting: Family Medicine

## 2014-01-07 ENCOUNTER — Emergency Department (HOSPITAL_COMMUNITY): Payer: Medicare Other

## 2014-01-07 ENCOUNTER — Encounter (HOSPITAL_COMMUNITY): Payer: Self-pay | Admitting: Emergency Medicine

## 2014-01-07 ENCOUNTER — Other Ambulatory Visit: Payer: Self-pay | Admitting: Family Medicine

## 2014-01-07 DIAGNOSIS — I82409 Acute embolism and thrombosis of unspecified deep veins of unspecified lower extremity: Secondary | ICD-10-CM | POA: Insufficient documentation

## 2014-01-07 DIAGNOSIS — M5137 Other intervertebral disc degeneration, lumbosacral region: Secondary | ICD-10-CM | POA: Diagnosis not present

## 2014-01-07 DIAGNOSIS — R404 Transient alteration of awareness: Secondary | ICD-10-CM | POA: Diagnosis not present

## 2014-01-07 DIAGNOSIS — E1149 Type 2 diabetes mellitus with other diabetic neurological complication: Secondary | ICD-10-CM | POA: Insufficient documentation

## 2014-01-07 DIAGNOSIS — R32 Unspecified urinary incontinence: Secondary | ICD-10-CM | POA: Diagnosis not present

## 2014-01-07 DIAGNOSIS — I82819 Embolism and thrombosis of superficial veins of unspecified lower extremities: Secondary | ICD-10-CM | POA: Diagnosis not present

## 2014-01-07 DIAGNOSIS — H3530 Unspecified macular degeneration: Secondary | ICD-10-CM | POA: Insufficient documentation

## 2014-01-07 DIAGNOSIS — R5381 Other malaise: Secondary | ICD-10-CM | POA: Diagnosis not present

## 2014-01-07 DIAGNOSIS — Z87891 Personal history of nicotine dependence: Secondary | ICD-10-CM | POA: Diagnosis not present

## 2014-01-07 DIAGNOSIS — Z79899 Other long term (current) drug therapy: Secondary | ICD-10-CM | POA: Insufficient documentation

## 2014-01-07 DIAGNOSIS — R05 Cough: Secondary | ICD-10-CM | POA: Insufficient documentation

## 2014-01-07 DIAGNOSIS — K5900 Constipation, unspecified: Secondary | ICD-10-CM | POA: Diagnosis not present

## 2014-01-07 DIAGNOSIS — Z8673 Personal history of transient ischemic attack (TIA), and cerebral infarction without residual deficits: Secondary | ICD-10-CM | POA: Insufficient documentation

## 2014-01-07 DIAGNOSIS — M51379 Other intervertebral disc degeneration, lumbosacral region without mention of lumbar back pain or lower extremity pain: Secondary | ICD-10-CM | POA: Insufficient documentation

## 2014-01-07 DIAGNOSIS — R059 Cough, unspecified: Secondary | ICD-10-CM | POA: Insufficient documentation

## 2014-01-07 DIAGNOSIS — I509 Heart failure, unspecified: Secondary | ICD-10-CM | POA: Diagnosis not present

## 2014-01-07 DIAGNOSIS — R531 Weakness: Secondary | ICD-10-CM

## 2014-01-07 DIAGNOSIS — M109 Gout, unspecified: Secondary | ICD-10-CM | POA: Diagnosis not present

## 2014-01-07 DIAGNOSIS — E1142 Type 2 diabetes mellitus with diabetic polyneuropathy: Secondary | ICD-10-CM | POA: Diagnosis not present

## 2014-01-07 DIAGNOSIS — N400 Enlarged prostate without lower urinary tract symptoms: Secondary | ICD-10-CM | POA: Insufficient documentation

## 2014-01-07 DIAGNOSIS — Z8619 Personal history of other infectious and parasitic diseases: Secondary | ICD-10-CM | POA: Diagnosis not present

## 2014-01-07 DIAGNOSIS — I10 Essential (primary) hypertension: Secondary | ICD-10-CM | POA: Insufficient documentation

## 2014-01-07 DIAGNOSIS — E782 Mixed hyperlipidemia: Secondary | ICD-10-CM | POA: Diagnosis not present

## 2014-01-07 DIAGNOSIS — R5383 Other fatigue: Secondary | ICD-10-CM | POA: Diagnosis not present

## 2014-01-07 LAB — URINALYSIS, ROUTINE W REFLEX MICROSCOPIC
Bilirubin Urine: NEGATIVE
Glucose, UA: NEGATIVE mg/dL
HGB URINE DIPSTICK: NEGATIVE
Ketones, ur: NEGATIVE mg/dL
LEUKOCYTES UA: NEGATIVE
Nitrite: NEGATIVE
SPECIFIC GRAVITY, URINE: 1.025 (ref 1.005–1.030)
UROBILINOGEN UA: 0.2 mg/dL (ref 0.0–1.0)
pH: 5.5 (ref 5.0–8.0)

## 2014-01-07 LAB — URINE MICROSCOPIC-ADD ON

## 2014-01-07 MED ORDER — ENOXAPARIN SODIUM 100 MG/ML ~~LOC~~ SOLN
1.0000 mg/kg | Freq: Once | SUBCUTANEOUS | Status: AC
  Administered 2014-01-07: 85 mg via SUBCUTANEOUS
  Filled 2014-01-07: qty 1

## 2014-01-07 MED ORDER — ONDANSETRON 4 MG PO TBDP: 4.0000 mg | ORAL_TABLET | Freq: Three times a day (TID) | ORAL | Status: DC | PRN

## 2014-01-07 NOTE — ED Notes (Signed)
Patient with c/o right knee pain with ambulation only. Right calf swelling noted. + homan's sign. No redness noted. Patient alert/oriented x 4. States difficulty with ambulation due to pain in knee. Patient with equal grips bilaterally. Denies any other complaints.

## 2014-01-07 NOTE — ED Notes (Signed)
Pt sent from [facility] for increased weakness/confusion and trouble performing ADL's x 2 days. Pt c/o right knee pain. nad noted.

## 2014-01-07 NOTE — Telephone Encounter (Signed)
ok 

## 2014-01-07 NOTE — Discharge Instructions (Signed)
Urinalysis and chest x-ray are normal.     Ultrasound of right leg shows a DVT in the greater saphenous vein.   Lovenox 1 mg per kilogram subcutaneous given in emergency department.    This will need followup at his facility.

## 2014-01-07 NOTE — ED Notes (Signed)
Report given to Hedrick, LPN at [facility].  Patient with no complaints at this time. Respirations even and unlabored. Skin warm/dry. Discharge instructions reviewed with patient at this time. Patient given opportunity to voice concerns/ask questions.  Patient discharged at this time and left Emergency Department via wheelchair.

## 2014-01-07 NOTE — ED Provider Notes (Addendum)
CSN: [id number]     Arrival date & time 01/07/14  0901 History  This chart was scribed for Nat Christen, MD by Era Bumpers, ED scribe. This patient was seen in room APA18/APA18 and the patient's care was started at 9:30 .    Chief Complaint  Patient presents with  . Weakness    Level 5 caveat: dementia  The history is provided by the patient and a relative. The history is limited by the condition of the patient. No language interpreter was used.   HPI Comments: Christian Thomas is a 80 y.o. male with a h/o DM and CHF who presents to the Emergency Department complaining of intermittent right anterior chest pain that began 2 days ago. Pain is not present currently and not associated with activity. Pt's niece explains he was also having difficulty getting out of bed, experiencing general weakness, urinary incontinence, and a cough. Pt denies SOB and fever. Pt's niece states he has improved since yesterday. Also patient complains of tenderness and swelling in the right posterior knee and calf   Past Medical History  Diagnosis Date  . Diverticulosis   . Mixed hyperlipidemia   . Gout   . Clostridium difficile colitis 12/2008  . Chronic constipation   . Essential hypertension, benign   . Type 2 diabetes mellitus   . Venous insufficiency   . Benign essential tremor   . Macular degeneration   . Diabetic neuropathy   . Dysrhythmia   . CHF (congestive heart failure)   . Stroke   . Cerebellar stroke syndrome   . DDD (degenerative disc disease), lumbar   . BPH (benign prostatic hypertrophy)     Past Surgical History  Procedure Laterality Date  . Hernia repair    . Hemorrhoid surgery    . Appendectomy    . Colonoscopy      NL TI, pan-TICS, sml IH, CDIFF COLITIS  . Sigmoidoscopy      Internal hemorrhoids  . Hip arthroplasty  11/07/2012    Procedure: ARTHROPLASTY BIPOLAR HIP;  Surgeon: Mauri Pole, MD;  Location: [facility] ORS;  Service: Orthopedics;  Laterality: Right;  . Colonoscopy N/A  12/15/2013    Procedure: Colonoscopy and possible EGD;  Surgeon: Rogene Houston, MD;  Location: [facility] ENDO SUITE;  Service: Endoscopy;  Laterality: N/A;    Family History  Problem Relation Age of Onset  . Cancer Mother     Breast    History  Substance Use Topics  . Smoking status: Former Smoker -- 0.50 packs/day for 54 years    Types: Cigarettes    Quit date: 11/19/1992  . Smokeless tobacco: Never Used  . Alcohol Use: Yes     Comment: Rare    Review of Systems  Unable to perform ROS: Dementia  Constitutional: Negative for fever and chills.  Respiratory: Positive for cough. Negative for shortness of breath.   Cardiovascular: Negative for chest pain.  Gastrointestinal: Negative for abdominal pain.  Musculoskeletal: Negative for back pain.  All other systems reviewed and are negative.      Allergies  Morphine and related and Other  Home Medications   Current Outpatient Rx  Name  Route  Sig  Dispense  Refill  . acetaminophen (TYLENOL) 500 MG tablet   Oral   Take 1,000 mg by mouth 2 (two) times daily.         Marland Kitchen allopurinol (ZYLOPRIM) 300 MG tablet      TAKE 1 TABLET BY MOUTH ONCE DAILY FOR GOUT.  30 tablet   2   . capsaicin (ZOSTRIX) 0.025 % cream   Topical   Apply 1 application topically 2 (two) times daily as needed (apply to left ankle and feet as needed).         . enalapril (VASOTEC) 5 MG tablet   Oral   Take 1 tablet (5 mg total) by mouth 2 (two) times daily.   60 tablet   5   . ferrous sulfate 325 (65 FE) MG tablet   Oral   Take 325 mg by mouth 2 (two) times daily with a meal.          . finasteride (PROSCAR) 5 MG tablet   Oral   Take 5 mg by mouth daily.         Marland Kitchen loratadine (CLARITIN) 10 MG tablet   Oral   Take 10 mg by mouth daily.         Marland Kitchen lovastatin (MEVACOR) 40 MG tablet   Oral   Take 40 mg by mouth at bedtime.         . metFORMIN (GLUCOPHAGE) 500 MG tablet      TAKE 1 TABLET BY MOUTH TWICE DAILY.   60 tablet   2   .  omeprazole (PRILOSEC) 20 MG capsule   Oral   Take 20 mg by mouth daily.         . polyethylene glycol (MIRALAX / GLYCOLAX) packet   Oral   Take 17 g by mouth 2 (two) times daily.          Marland Kitchen senna (SENOKOT) 8.6 MG TABS tablet      TAKE 2 TABLETS BY MOUTH AT BEDTIME.   60 each   5   . Specialty Vitamins Products (ICAPS LUTEIN-ZEAXANTHIN PO)   Oral   Take 2 tablets by mouth daily.         . traMADol (ULTRAM) 50 MG tablet   Oral   Take 50 mg by mouth daily with supper.         . ALPRAZolam (XANAX) 0.25 MG tablet   Oral   Take 1 tablet (0.25 mg total) by mouth 2 (two) times daily as needed for anxiety.   60 tablet   0   . diphenhydrAMINE (BENADRYL) 25 MG tablet   Oral   Take 25 mg by mouth every 6 (six) hours as needed for itching.         . INSTA-GLUCOSE 77.4 % GEL      GIVE 1 TUBE BY MOUTH IF B/S <60; WAIT 15 MINUTES THEN RECHECK.   93 g   2   . lactulose (CHRONULAC) 10 GM/15ML solution   Oral   Take 15 g by mouth daily as needed. constipation         . meclizine (ANTIVERT) 25 MG tablet   Oral   Take 25 mg by mouth every 6 (six) hours as needed for dizziness.         . ondansetron (ZOFRAN ODT) 4 MG disintegrating tablet   Oral   Take 1 tablet (4 mg total) by mouth every 8 (eight) hours as needed for nausea or vomiting.   20 tablet   0   . SORE THROAT LIQD      USE 5 SPRAYS IN THROAT EVERY 2 HOURS AS NEEDED FOR SORE THROAT.   177 mL   3    Triage Vitals: BP 195/80  Pulse 73  Temp(Src) 98.1 F (36.7 C)  Resp 18  Ht  6\' 2"  (1.88 m)  Wt 185 lb (83.915 kg)  BMI 23.74 kg/m2  SpO2 95%  Physical Exam  Nursing note and vitals reviewed. Constitutional:  demented  HENT:  Head: Normocephalic and atraumatic.  Eyes: Conjunctivae and EOM are normal. Pupils are equal, round, and reactive to light.  Neck: Normal range of motion. Neck supple.  Cardiovascular: Normal rate, regular rhythm and normal heart sounds.   Pulmonary/Chest: Effort normal and  breath sounds normal.  Abdominal: Soft. Bowel sounds are normal.  Musculoskeletal:  Min swelling RLE  Neurological:  demented  Skin: Skin is warm and dry.  Psychiatric:  demented    ED Course  Procedures (including critical care time)  DIAGNOSTIC STUDIES: Oxygen Saturation is 95% on [place], adequate by my interpretation.    COORDINATION OF CARE: At 9:32 Discussed treatment plan with patient which includes CXR and UA. Patient agrees.      Labs Review Labs Reviewed  URINALYSIS, ROUTINE W REFLEX MICROSCOPIC - Abnormal; Notable for the following:    Protein, ur TRACE (*)    All other components within normal limits  URINE MICROSCOPIC-ADD ON   Imaging Review Dg Chest 2 View  01/07/2014   CLINICAL DATA:  Weakness and confusion, difficulty cooperating with examination  EXAM: CHEST  2 VIEW  COMPARISON:  CT ABD - PELV W/ CM dated 12/14/2013  FINDINGS: The lungs are adequately inflated. There is no focal infiltrate. There are tiny stable subcentimeter pulmonary nodules bilaterally which likely reflect the sequelae of previous granulomatous infection. The cardiac silhouette is top normal in size. The pulmonary vascularity is not engorged. There is no pleural effusion or pneumothorax. The mediastinum is normal in width. There is curvature of the descending thoracic aorta which is stable. There is gentle upper thoracic spine dextroscoliosis.  IMPRESSION: There is no evidence of pneumonia nor CHF nor other acute cardiopulmonary abnormality. Minimal stable prominence of the interstitial markings may reflect fibrosis.   Electronically Signed   By: David  Martinique   On: 01/07/2014 10:17   US Venous Img Lower Unilateral Right  01/07/2014   CLINICAL DATA:  Right lower extremity pain and swelling.  EXAM: Right LOWER EXTREMITY VENOUS DOPPLER ULTRASOUND  TECHNIQUE: Gray-scale sonography with graded compression, as well as color Doppler and duplex ultrasound, were performed to evaluate the deep venous system  from the level of the common femoral vein through the popliteal and proximal calf veins. Spectral Doppler was utilized to evaluate flow at rest and with distal augmentation maneuvers.  COMPARISON:  None.  FINDINGS: Thrombus within deep veins:  None visualized.  Compressibility of deep veins:  Normal.  Duplex waveform respiratory phasicity:  Normal.  Duplex waveform response to augmentation:  Normal.  Venous reflux:  None visualized.  Other findings: There is thrombus within the mid and distal portions of the greater saphenous vein on the right. There is a complex popliteal mass medially measuring up to 6 cm in greatest dimension most compatible with a Baker's cyst.  IMPRESSION: 1. There is no evidence of thrombus within the common, superficial, or deep femoral or popliteal veins. The observed portions of the veins in the upper calf appear normal as well. 2. There is thrombus within the mid and distal portions of the greater saphenous vein. 3. There is a popliteal cyst.   Electronically Signed   By: David  Martinique   On: 01/07/2014 13:38    EKG Interpretation   None       MDM   Final diagnoses:  Weakness  DVT (deep venous thrombosis)    Niece reports normal behavior today. Chest x-ray and urinalysis negative. Doppler study of right lower extremity reveals a thrombus in the distal portion of the greater saphenous vein. Will start Lovenox 1 mg per kilogram. He will continue to get anticoagulation at his nursing facility.  Patient has primary care followup. I personally performed the services described in this documentation, which was scribed in my presence. The recorded information has been reviewed and is accurate.   Nat Christen, MD 01/07/14 [place], MD 01/08/14 [id number]  Nat Christen, MD 01/16/14 [phone]

## 2014-01-07 NOTE — Telephone Encounter (Signed)
Patients daughter calling to let you know they are in the process of taking patient to the hospital for possible UTI. Just an FYI.

## 2014-01-08 ENCOUNTER — Ambulatory Visit (INDEPENDENT_AMBULATORY_CARE_PROVIDER_SITE_OTHER): Payer: Medicare Other | Admitting: Family Medicine

## 2014-01-08 ENCOUNTER — Encounter: Payer: Self-pay | Admitting: Family Medicine

## 2014-01-08 VITALS — BP 120/60 | Ht 74.0 in | Wt 176.2 lb

## 2014-01-08 DIAGNOSIS — I82409 Acute embolism and thrombosis of unspecified deep veins of unspecified lower extremity: Secondary | ICD-10-CM

## 2014-01-08 NOTE — Progress Notes (Signed)
   Subjective:    Patient ID: Christian Thomas, male    DOB: [birth year], 80 y.o.   MRN: [id number]  HPI Patient was seen at [facility] ER yesterday for weakness and indigestion. He was diagnosed with a small blood clot in his leg. He also complains of sore throat, nausea and vomiting.    Review of Systems     Objective:   Physical Exam        Assessment & Plan:

## 2014-01-09 DIAGNOSIS — K2960 Other gastritis without bleeding: Secondary | ICD-10-CM | POA: Diagnosis not present

## 2014-01-09 DIAGNOSIS — D62 Acute posthemorrhagic anemia: Secondary | ICD-10-CM | POA: Diagnosis not present

## 2014-01-09 DIAGNOSIS — E1149 Type 2 diabetes mellitus with other diabetic neurological complication: Secondary | ICD-10-CM | POA: Diagnosis not present

## 2014-01-09 DIAGNOSIS — K5730 Diverticulosis of large intestine without perforation or abscess without bleeding: Secondary | ICD-10-CM | POA: Diagnosis not present

## 2014-01-09 DIAGNOSIS — M1710 Unilateral primary osteoarthritis, unspecified knee: Secondary | ICD-10-CM | POA: Diagnosis not present

## 2014-01-09 DIAGNOSIS — E1142 Type 2 diabetes mellitus with diabetic polyneuropathy: Secondary | ICD-10-CM | POA: Diagnosis not present

## 2014-01-11 ENCOUNTER — Telehealth: Payer: Self-pay | Admitting: *Deleted

## 2014-01-11 ENCOUNTER — Telehealth: Payer: Self-pay | Admitting: Family Medicine

## 2014-01-11 ENCOUNTER — Other Ambulatory Visit: Payer: Self-pay | Admitting: *Deleted

## 2014-01-11 DIAGNOSIS — K5730 Diverticulosis of large intestine without perforation or abscess without bleeding: Secondary | ICD-10-CM | POA: Diagnosis not present

## 2014-01-11 DIAGNOSIS — M1710 Unilateral primary osteoarthritis, unspecified knee: Secondary | ICD-10-CM | POA: Diagnosis not present

## 2014-01-11 DIAGNOSIS — E1142 Type 2 diabetes mellitus with diabetic polyneuropathy: Secondary | ICD-10-CM | POA: Diagnosis not present

## 2014-01-11 DIAGNOSIS — J811 Chronic pulmonary edema: Secondary | ICD-10-CM | POA: Diagnosis not present

## 2014-01-11 DIAGNOSIS — E1149 Type 2 diabetes mellitus with other diabetic neurological complication: Secondary | ICD-10-CM | POA: Diagnosis not present

## 2014-01-11 DIAGNOSIS — K2960 Other gastritis without bleeding: Secondary | ICD-10-CM | POA: Diagnosis not present

## 2014-01-11 DIAGNOSIS — D62 Acute posthemorrhagic anemia: Secondary | ICD-10-CM | POA: Diagnosis not present

## 2014-01-11 MED ORDER — CEFPROZIL 500 MG PO TABS: 500.0000 mg | ORAL_TABLET | Freq: Two times a day (BID) | ORAL | Status: DC

## 2014-01-11 NOTE — Telephone Encounter (Signed)
Calling to let you know that she was unable to complete therapy this week due to patient being sick.

## 2014-01-11 NOTE — Telephone Encounter (Signed)
Elmyra Ricks nurse from [facility] called to report crackles in right lower lung, not feeling well since tues, having cough with yellow mucus, temp 97.4, O2 on room air 94%. Edras's daughter wants a mobile chest xray because she does not want to bring him out. Consult with Dr. Nicki Reaper. Ok to do stat xray. Need results today. cefzil 500 bid for 10 days. Go to ER if shortness of breath or if worse. Needs office visit tues with Dr. Richardson Landry to recheck lungs and to go over ultrasound results. Discussed all of this with nicole from [place]. Order faxed to [facility] for cefzil and stat chest xray. [place] to call back and schedule appt for Tuesday.

## 2014-01-12 DIAGNOSIS — K5730 Diverticulosis of large intestine without perforation or abscess without bleeding: Secondary | ICD-10-CM | POA: Diagnosis not present

## 2014-01-12 DIAGNOSIS — D62 Acute posthemorrhagic anemia: Secondary | ICD-10-CM | POA: Diagnosis not present

## 2014-01-12 DIAGNOSIS — E1149 Type 2 diabetes mellitus with other diabetic neurological complication: Secondary | ICD-10-CM | POA: Diagnosis not present

## 2014-01-12 DIAGNOSIS — E1142 Type 2 diabetes mellitus with diabetic polyneuropathy: Secondary | ICD-10-CM | POA: Diagnosis not present

## 2014-01-12 DIAGNOSIS — K2960 Other gastritis without bleeding: Secondary | ICD-10-CM | POA: Diagnosis not present

## 2014-01-12 DIAGNOSIS — M1710 Unilateral primary osteoarthritis, unspecified knee: Secondary | ICD-10-CM | POA: Diagnosis not present

## 2014-01-13 DIAGNOSIS — M1710 Unilateral primary osteoarthritis, unspecified knee: Secondary | ICD-10-CM | POA: Diagnosis not present

## 2014-01-13 DIAGNOSIS — K2960 Other gastritis without bleeding: Secondary | ICD-10-CM | POA: Diagnosis not present

## 2014-01-13 DIAGNOSIS — D62 Acute posthemorrhagic anemia: Secondary | ICD-10-CM | POA: Diagnosis not present

## 2014-01-13 DIAGNOSIS — K5730 Diverticulosis of large intestine without perforation or abscess without bleeding: Secondary | ICD-10-CM | POA: Diagnosis not present

## 2014-01-13 DIAGNOSIS — E1142 Type 2 diabetes mellitus with diabetic polyneuropathy: Secondary | ICD-10-CM | POA: Diagnosis not present

## 2014-01-13 DIAGNOSIS — E1149 Type 2 diabetes mellitus with other diabetic neurological complication: Secondary | ICD-10-CM | POA: Diagnosis not present

## 2014-01-13 NOTE — Telephone Encounter (Signed)
k

## 2014-01-14 ENCOUNTER — Ambulatory Visit (HOSPITAL_COMMUNITY)
Admission: RE | Admit: 2014-01-14 | Discharge: 2014-01-14 | Disposition: A | Discharge status: Home / Self Care | Payer: Medicare Other | Source: Ambulatory Visit | Attending: Family Medicine | Admitting: Family Medicine

## 2014-01-14 DIAGNOSIS — E1142 Type 2 diabetes mellitus with diabetic polyneuropathy: Secondary | ICD-10-CM | POA: Diagnosis not present

## 2014-01-14 DIAGNOSIS — E1149 Type 2 diabetes mellitus with other diabetic neurological complication: Secondary | ICD-10-CM | POA: Diagnosis not present

## 2014-01-14 DIAGNOSIS — R609 Edema, unspecified: Secondary | ICD-10-CM | POA: Diagnosis not present

## 2014-01-14 DIAGNOSIS — Z86718 Personal history of other venous thrombosis and embolism: Secondary | ICD-10-CM | POA: Diagnosis not present

## 2014-01-14 DIAGNOSIS — M1710 Unilateral primary osteoarthritis, unspecified knee: Secondary | ICD-10-CM | POA: Diagnosis not present

## 2014-01-14 DIAGNOSIS — M7120 Synovial cyst of popliteal space [Baker], unspecified knee: Secondary | ICD-10-CM | POA: Diagnosis not present

## 2014-01-14 DIAGNOSIS — K5730 Diverticulosis of large intestine without perforation or abscess without bleeding: Secondary | ICD-10-CM | POA: Diagnosis not present

## 2014-01-14 DIAGNOSIS — D62 Acute posthemorrhagic anemia: Secondary | ICD-10-CM | POA: Diagnosis not present

## 2014-01-14 DIAGNOSIS — K2960 Other gastritis without bleeding: Secondary | ICD-10-CM | POA: Diagnosis not present

## 2014-01-15 ENCOUNTER — Encounter: Payer: Self-pay | Admitting: Family Medicine

## 2014-01-15 ENCOUNTER — Ambulatory Visit (INDEPENDENT_AMBULATORY_CARE_PROVIDER_SITE_OTHER): Payer: Medicare Other | Admitting: Family Medicine

## 2014-01-15 VITALS — BP 128/60 | Ht 74.0 in | Wt 174.0 lb

## 2014-01-15 DIAGNOSIS — M1710 Unilateral primary osteoarthritis, unspecified knee: Secondary | ICD-10-CM | POA: Diagnosis not present

## 2014-01-15 DIAGNOSIS — I10 Essential (primary) hypertension: Secondary | ICD-10-CM

## 2014-01-15 DIAGNOSIS — E1149 Type 2 diabetes mellitus with other diabetic neurological complication: Secondary | ICD-10-CM | POA: Diagnosis not present

## 2014-01-15 DIAGNOSIS — E119 Type 2 diabetes mellitus without complications: Secondary | ICD-10-CM

## 2014-01-15 DIAGNOSIS — J209 Acute bronchitis, unspecified: Secondary | ICD-10-CM | POA: Diagnosis not present

## 2014-01-15 DIAGNOSIS — K2960 Other gastritis without bleeding: Secondary | ICD-10-CM | POA: Diagnosis not present

## 2014-01-15 DIAGNOSIS — K5730 Diverticulosis of large intestine without perforation or abscess without bleeding: Secondary | ICD-10-CM | POA: Diagnosis not present

## 2014-01-15 DIAGNOSIS — D62 Acute posthemorrhagic anemia: Secondary | ICD-10-CM | POA: Diagnosis not present

## 2014-01-15 DIAGNOSIS — E1142 Type 2 diabetes mellitus with diabetic polyneuropathy: Secondary | ICD-10-CM | POA: Diagnosis not present

## 2014-01-15 NOTE — Progress Notes (Signed)
   Subjective:    Patient ID: Christian Thomas, male    DOB: 12-01-1914, 80 y.o.   MRN: [id number]  HPI Patient is here today to discuss the ultrasound results that was done on his right leg yesterday. Patient reports no significant difficulty with leg pain.   Patient states that he has no other concerns at this time during this visit. Actually patient's caretaker states that they weren't very worried about patient on Friday. He had a lot of congestion in his chest.  Got pretty sick last wk.  Developed congestion and bad gurgling cough.  Had gunky wounding breathing  Felt terribly weak, and difficulty with strenth.  Felt better this weekend.  Subsequent cough was productive. Now somewhat better. Patient's energy slowly improving. Review of Systems No vomiting no diarrhea no chest pain no abdominal pain no change in bowel habits ROS otherwise negative    Objective:   Physical Exam Alert no apparent distress. HEENT slight nasal congestion neck supple. Lungs currently clear. Heart regular rate and rhythm. Ankles without edema.       Assessment & Plan:  Impression #1 recent questionable thrombosis the ultrasound. Ultrasound shows complete resolution. Fortunately we did not proceed with anticoagulation given patient's history of recent hemorrhage. #2 bronchitis clinically improved. #3 Gen. fatigue secondary to both chronic and acute difficulties. Plan encouraged to get back to regular nutrition. Finish antibiotics. Warning signs discussed. 25 minutes spent most in discussion. WSL

## 2014-01-16 ENCOUNTER — Telehealth: Payer: Self-pay | Admitting: *Deleted

## 2014-01-16 DIAGNOSIS — E1142 Type 2 diabetes mellitus with diabetic polyneuropathy: Secondary | ICD-10-CM | POA: Diagnosis not present

## 2014-01-16 DIAGNOSIS — D62 Acute posthemorrhagic anemia: Secondary | ICD-10-CM | POA: Diagnosis not present

## 2014-01-16 DIAGNOSIS — K2960 Other gastritis without bleeding: Secondary | ICD-10-CM | POA: Diagnosis not present

## 2014-01-16 DIAGNOSIS — E1149 Type 2 diabetes mellitus with other diabetic neurological complication: Secondary | ICD-10-CM | POA: Diagnosis not present

## 2014-01-16 DIAGNOSIS — K5730 Diverticulosis of large intestine without perforation or abscess without bleeding: Secondary | ICD-10-CM | POA: Diagnosis not present

## 2014-01-16 DIAGNOSIS — M1710 Unilateral primary osteoarthritis, unspecified knee: Secondary | ICD-10-CM | POA: Diagnosis not present

## 2014-01-16 NOTE — Telephone Encounter (Signed)
[facility] called and wanted to let us know that [place] is also complaining of right-sided groin pain since Monday. He said he forgot to tell you this at the [facility]. No other symptoms.

## 2014-01-17 DIAGNOSIS — K5730 Diverticulosis of large intestine without perforation or abscess without bleeding: Secondary | ICD-10-CM | POA: Diagnosis not present

## 2014-01-17 DIAGNOSIS — D62 Acute posthemorrhagic anemia: Secondary | ICD-10-CM | POA: Diagnosis not present

## 2014-01-17 DIAGNOSIS — E1149 Type 2 diabetes mellitus with other diabetic neurological complication: Secondary | ICD-10-CM | POA: Diagnosis not present

## 2014-01-17 DIAGNOSIS — E1142 Type 2 diabetes mellitus with diabetic polyneuropathy: Secondary | ICD-10-CM | POA: Diagnosis not present

## 2014-01-17 DIAGNOSIS — K2960 Other gastritis without bleeding: Secondary | ICD-10-CM | POA: Diagnosis not present

## 2014-01-17 DIAGNOSIS — M1710 Unilateral primary osteoarthritis, unspecified knee: Secondary | ICD-10-CM | POA: Diagnosis not present

## 2014-01-18 DIAGNOSIS — E1149 Type 2 diabetes mellitus with other diabetic neurological complication: Secondary | ICD-10-CM | POA: Diagnosis not present

## 2014-01-18 DIAGNOSIS — E1142 Type 2 diabetes mellitus with diabetic polyneuropathy: Secondary | ICD-10-CM | POA: Diagnosis not present

## 2014-01-18 DIAGNOSIS — D62 Acute posthemorrhagic anemia: Secondary | ICD-10-CM | POA: Diagnosis not present

## 2014-01-18 DIAGNOSIS — K5730 Diverticulosis of large intestine without perforation or abscess without bleeding: Secondary | ICD-10-CM | POA: Diagnosis not present

## 2014-01-18 DIAGNOSIS — M1710 Unilateral primary osteoarthritis, unspecified knee: Secondary | ICD-10-CM | POA: Diagnosis not present

## 2014-01-18 DIAGNOSIS — K2960 Other gastritis without bleeding: Secondary | ICD-10-CM | POA: Diagnosis not present

## 2014-01-21 NOTE — Telephone Encounter (Signed)
[facility] and make sure this is better

## 2014-01-21 NOTE — Telephone Encounter (Signed)
Talked with Sharyn Lull (nurse) and she stated that patient has not complained about this issue at all to her. She has been taking care of him all weekend and he has not mentioned any groin pain to her at all. Will call back if patient starts to complain about this.

## 2014-01-22 ENCOUNTER — Telehealth: Payer: Self-pay | Admitting: Family Medicine

## 2014-01-22 DIAGNOSIS — K2960 Other gastritis without bleeding: Secondary | ICD-10-CM | POA: Diagnosis not present

## 2014-01-22 DIAGNOSIS — E1149 Type 2 diabetes mellitus with other diabetic neurological complication: Secondary | ICD-10-CM | POA: Diagnosis not present

## 2014-01-22 DIAGNOSIS — E1142 Type 2 diabetes mellitus with diabetic polyneuropathy: Secondary | ICD-10-CM | POA: Diagnosis not present

## 2014-01-22 DIAGNOSIS — D62 Acute posthemorrhagic anemia: Secondary | ICD-10-CM | POA: Diagnosis not present

## 2014-01-22 DIAGNOSIS — K5730 Diverticulosis of large intestine without perforation or abscess without bleeding: Secondary | ICD-10-CM | POA: Diagnosis not present

## 2014-01-22 DIAGNOSIS — M1710 Unilateral primary osteoarthritis, unspecified knee: Secondary | ICD-10-CM | POA: Diagnosis not present

## 2014-01-22 MED ORDER — TRAMADOL HCL 50 MG PO TABS: 50.0000 mg | ORAL_TABLET | Freq: Every day | ORAL | Status: DC

## 2014-01-22 NOTE — Telephone Encounter (Signed)
Please send over hold order ASAP

## 2014-01-22 NOTE — Telephone Encounter (Signed)
ok 

## 2014-01-22 NOTE — Telephone Encounter (Signed)
Patient needs Rx for Tramadol to [facility]

## 2014-01-22 NOTE — Telephone Encounter (Signed)
rx faxed to [facility]

## 2014-01-24 ENCOUNTER — Other Ambulatory Visit: Payer: Self-pay | Admitting: Family Medicine

## 2014-01-24 DIAGNOSIS — D62 Acute posthemorrhagic anemia: Secondary | ICD-10-CM | POA: Diagnosis not present

## 2014-01-24 DIAGNOSIS — E1142 Type 2 diabetes mellitus with diabetic polyneuropathy: Secondary | ICD-10-CM | POA: Diagnosis not present

## 2014-01-24 DIAGNOSIS — K2960 Other gastritis without bleeding: Secondary | ICD-10-CM | POA: Diagnosis not present

## 2014-01-24 DIAGNOSIS — K5730 Diverticulosis of large intestine without perforation or abscess without bleeding: Secondary | ICD-10-CM | POA: Diagnosis not present

## 2014-01-24 DIAGNOSIS — E1149 Type 2 diabetes mellitus with other diabetic neurological complication: Secondary | ICD-10-CM | POA: Diagnosis not present

## 2014-01-24 DIAGNOSIS — M1710 Unilateral primary osteoarthritis, unspecified knee: Secondary | ICD-10-CM | POA: Diagnosis not present

## 2014-01-28 ENCOUNTER — Encounter: Payer: Self-pay | Admitting: Family Medicine

## 2014-01-28 DIAGNOSIS — D62 Acute posthemorrhagic anemia: Secondary | ICD-10-CM | POA: Diagnosis not present

## 2014-01-28 DIAGNOSIS — E1149 Type 2 diabetes mellitus with other diabetic neurological complication: Secondary | ICD-10-CM | POA: Diagnosis not present

## 2014-01-28 DIAGNOSIS — K5730 Diverticulosis of large intestine without perforation or abscess without bleeding: Secondary | ICD-10-CM | POA: Diagnosis not present

## 2014-01-28 DIAGNOSIS — M1710 Unilateral primary osteoarthritis, unspecified knee: Secondary | ICD-10-CM | POA: Diagnosis not present

## 2014-01-28 DIAGNOSIS — E1142 Type 2 diabetes mellitus with diabetic polyneuropathy: Secondary | ICD-10-CM | POA: Diagnosis not present

## 2014-01-28 DIAGNOSIS — K2960 Other gastritis without bleeding: Secondary | ICD-10-CM | POA: Diagnosis not present

## 2014-01-29 ENCOUNTER — Other Ambulatory Visit: Payer: Self-pay | Admitting: Family Medicine

## 2014-01-30 DIAGNOSIS — N390 Urinary tract infection, site not specified: Secondary | ICD-10-CM | POA: Diagnosis not present

## 2014-01-31 DIAGNOSIS — K2960 Other gastritis without bleeding: Secondary | ICD-10-CM | POA: Diagnosis not present

## 2014-01-31 DIAGNOSIS — E1149 Type 2 diabetes mellitus with other diabetic neurological complication: Secondary | ICD-10-CM | POA: Diagnosis not present

## 2014-01-31 DIAGNOSIS — E1142 Type 2 diabetes mellitus with diabetic polyneuropathy: Secondary | ICD-10-CM | POA: Diagnosis not present

## 2014-01-31 DIAGNOSIS — D62 Acute posthemorrhagic anemia: Secondary | ICD-10-CM | POA: Diagnosis not present

## 2014-01-31 DIAGNOSIS — M1710 Unilateral primary osteoarthritis, unspecified knee: Secondary | ICD-10-CM | POA: Diagnosis not present

## 2014-01-31 DIAGNOSIS — K5730 Diverticulosis of large intestine without perforation or abscess without bleeding: Secondary | ICD-10-CM | POA: Diagnosis not present

## 2014-02-01 ENCOUNTER — Other Ambulatory Visit: Payer: Self-pay | Admitting: Family Medicine

## 2014-02-05 DIAGNOSIS — E1149 Type 2 diabetes mellitus with other diabetic neurological complication: Secondary | ICD-10-CM | POA: Diagnosis not present

## 2014-02-05 DIAGNOSIS — K2960 Other gastritis without bleeding: Secondary | ICD-10-CM | POA: Diagnosis not present

## 2014-02-05 DIAGNOSIS — E1142 Type 2 diabetes mellitus with diabetic polyneuropathy: Secondary | ICD-10-CM | POA: Diagnosis not present

## 2014-02-05 DIAGNOSIS — M1710 Unilateral primary osteoarthritis, unspecified knee: Secondary | ICD-10-CM | POA: Diagnosis not present

## 2014-02-05 DIAGNOSIS — K5730 Diverticulosis of large intestine without perforation or abscess without bleeding: Secondary | ICD-10-CM | POA: Diagnosis not present

## 2014-02-05 DIAGNOSIS — D62 Acute posthemorrhagic anemia: Secondary | ICD-10-CM | POA: Diagnosis not present

## 2014-02-07 ENCOUNTER — Ambulatory Visit (INDEPENDENT_AMBULATORY_CARE_PROVIDER_SITE_OTHER): Payer: Medicare Other | Admitting: Family Medicine

## 2014-02-07 ENCOUNTER — Encounter: Payer: Self-pay | Admitting: Family Medicine

## 2014-02-07 VITALS — BP 158/70 | Ht 74.0 in | Wt 184.0 lb

## 2014-02-07 DIAGNOSIS — N400 Enlarged prostate without lower urinary tract symptoms: Secondary | ICD-10-CM | POA: Diagnosis not present

## 2014-02-07 DIAGNOSIS — E119 Type 2 diabetes mellitus without complications: Secondary | ICD-10-CM | POA: Diagnosis not present

## 2014-02-07 DIAGNOSIS — I10 Essential (primary) hypertension: Secondary | ICD-10-CM | POA: Diagnosis not present

## 2014-02-07 NOTE — Progress Notes (Signed)
   Subjective:    Patient ID: Christian Thomas, male    DOB: 09-Jan-1915, 80 y.o.   MRN: [id number]  HPI Patient is here today for a check up.  Patient said everything is going well.   No concerns Trying to walk ande exercise  Glucoses overall good. Patient brings in sugars. Overall running good control.  Also brings in blood pressure levels. Some of them are on the elevated side. Some are normal.  Patient reports cough and shortness of breath her better.  Patient reports his arthritis is worsening. During the day more of a problem.    Review of Systems No headache no chest pain no back pain no abdominal pain no change in bowel habits    Objective:   Physical Exam  Alert no apparent distress. HEENT normal. Lungs clear. Heart regular rate and rhythm. Knees significant crepitations. Ankles without edema.  Blood pressure on repeat perfect at 130/82    Assessment & Plan:  Impression 1 hypertension good control discussed #2 arthritis worsening discuss Will try to increase the tramadol #3 type 2 diabetes good control. #4 bronchitis clinically resolved. Plan try to increase the tramadol 2 twice a day when necessary. Maintain same doses of other meds. Diet exercise discussed. Recheck as scheduled. WSL

## 2014-02-08 DIAGNOSIS — I509 Heart failure, unspecified: Secondary | ICD-10-CM | POA: Diagnosis not present

## 2014-02-08 DIAGNOSIS — M6281 Muscle weakness (generalized): Secondary | ICD-10-CM | POA: Diagnosis not present

## 2014-02-08 DIAGNOSIS — M1710 Unilateral primary osteoarthritis, unspecified knee: Secondary | ICD-10-CM | POA: Diagnosis not present

## 2014-02-08 DIAGNOSIS — M25569 Pain in unspecified knee: Secondary | ICD-10-CM | POA: Diagnosis not present

## 2014-02-08 DIAGNOSIS — R262 Difficulty in walking, not elsewhere classified: Secondary | ICD-10-CM | POA: Diagnosis not present

## 2014-02-11 DIAGNOSIS — M25569 Pain in unspecified knee: Secondary | ICD-10-CM | POA: Diagnosis not present

## 2014-02-11 DIAGNOSIS — M1710 Unilateral primary osteoarthritis, unspecified knee: Secondary | ICD-10-CM | POA: Diagnosis not present

## 2014-02-11 DIAGNOSIS — I509 Heart failure, unspecified: Secondary | ICD-10-CM | POA: Diagnosis not present

## 2014-02-11 DIAGNOSIS — R262 Difficulty in walking, not elsewhere classified: Secondary | ICD-10-CM | POA: Diagnosis not present

## 2014-02-11 DIAGNOSIS — M6281 Muscle weakness (generalized): Secondary | ICD-10-CM | POA: Diagnosis not present

## 2014-02-12 DIAGNOSIS — M6281 Muscle weakness (generalized): Secondary | ICD-10-CM | POA: Diagnosis not present

## 2014-02-12 DIAGNOSIS — M25569 Pain in unspecified knee: Secondary | ICD-10-CM | POA: Diagnosis not present

## 2014-02-12 DIAGNOSIS — M1710 Unilateral primary osteoarthritis, unspecified knee: Secondary | ICD-10-CM | POA: Diagnosis not present

## 2014-02-12 DIAGNOSIS — I509 Heart failure, unspecified: Secondary | ICD-10-CM | POA: Diagnosis not present

## 2014-02-12 DIAGNOSIS — R262 Difficulty in walking, not elsewhere classified: Secondary | ICD-10-CM | POA: Diagnosis not present

## 2014-02-13 DIAGNOSIS — M6281 Muscle weakness (generalized): Secondary | ICD-10-CM | POA: Diagnosis not present

## 2014-02-13 DIAGNOSIS — M25569 Pain in unspecified knee: Secondary | ICD-10-CM | POA: Diagnosis not present

## 2014-02-13 DIAGNOSIS — R262 Difficulty in walking, not elsewhere classified: Secondary | ICD-10-CM | POA: Diagnosis not present

## 2014-02-13 DIAGNOSIS — M1710 Unilateral primary osteoarthritis, unspecified knee: Secondary | ICD-10-CM | POA: Diagnosis not present

## 2014-02-13 DIAGNOSIS — I509 Heart failure, unspecified: Secondary | ICD-10-CM | POA: Diagnosis not present

## 2014-02-14 ENCOUNTER — Other Ambulatory Visit: Payer: Self-pay | Admitting: Family Medicine

## 2014-02-15 DIAGNOSIS — I509 Heart failure, unspecified: Secondary | ICD-10-CM | POA: Diagnosis not present

## 2014-02-15 DIAGNOSIS — M6281 Muscle weakness (generalized): Secondary | ICD-10-CM | POA: Diagnosis not present

## 2014-02-15 DIAGNOSIS — M25569 Pain in unspecified knee: Secondary | ICD-10-CM | POA: Diagnosis not present

## 2014-02-15 DIAGNOSIS — M1710 Unilateral primary osteoarthritis, unspecified knee: Secondary | ICD-10-CM | POA: Diagnosis not present

## 2014-02-15 DIAGNOSIS — R262 Difficulty in walking, not elsewhere classified: Secondary | ICD-10-CM | POA: Diagnosis not present

## 2014-02-18 DIAGNOSIS — R262 Difficulty in walking, not elsewhere classified: Secondary | ICD-10-CM | POA: Diagnosis not present

## 2014-02-18 DIAGNOSIS — M25569 Pain in unspecified knee: Secondary | ICD-10-CM | POA: Diagnosis not present

## 2014-02-18 DIAGNOSIS — M6281 Muscle weakness (generalized): Secondary | ICD-10-CM | POA: Diagnosis not present

## 2014-02-18 DIAGNOSIS — M1710 Unilateral primary osteoarthritis, unspecified knee: Secondary | ICD-10-CM | POA: Diagnosis not present

## 2014-02-18 DIAGNOSIS — I509 Heart failure, unspecified: Secondary | ICD-10-CM | POA: Diagnosis not present

## 2014-02-19 DIAGNOSIS — I509 Heart failure, unspecified: Secondary | ICD-10-CM | POA: Diagnosis not present

## 2014-02-19 DIAGNOSIS — M1710 Unilateral primary osteoarthritis, unspecified knee: Secondary | ICD-10-CM | POA: Diagnosis not present

## 2014-02-19 DIAGNOSIS — M25569 Pain in unspecified knee: Secondary | ICD-10-CM | POA: Diagnosis not present

## 2014-02-19 DIAGNOSIS — R262 Difficulty in walking, not elsewhere classified: Secondary | ICD-10-CM | POA: Diagnosis not present

## 2014-02-19 DIAGNOSIS — M6281 Muscle weakness (generalized): Secondary | ICD-10-CM | POA: Diagnosis not present

## 2014-02-20 DIAGNOSIS — I509 Heart failure, unspecified: Secondary | ICD-10-CM | POA: Diagnosis not present

## 2014-02-20 DIAGNOSIS — R262 Difficulty in walking, not elsewhere classified: Secondary | ICD-10-CM | POA: Diagnosis not present

## 2014-02-20 DIAGNOSIS — M25569 Pain in unspecified knee: Secondary | ICD-10-CM | POA: Diagnosis not present

## 2014-02-20 DIAGNOSIS — M6281 Muscle weakness (generalized): Secondary | ICD-10-CM | POA: Diagnosis not present

## 2014-02-20 DIAGNOSIS — M1710 Unilateral primary osteoarthritis, unspecified knee: Secondary | ICD-10-CM | POA: Diagnosis not present

## 2014-02-21 ENCOUNTER — Other Ambulatory Visit: Payer: Self-pay | Admitting: Family Medicine

## 2014-02-21 DIAGNOSIS — I509 Heart failure, unspecified: Secondary | ICD-10-CM | POA: Diagnosis not present

## 2014-02-21 DIAGNOSIS — M25569 Pain in unspecified knee: Secondary | ICD-10-CM | POA: Diagnosis not present

## 2014-02-21 DIAGNOSIS — M6281 Muscle weakness (generalized): Secondary | ICD-10-CM | POA: Diagnosis not present

## 2014-02-21 DIAGNOSIS — R262 Difficulty in walking, not elsewhere classified: Secondary | ICD-10-CM | POA: Diagnosis not present

## 2014-02-21 DIAGNOSIS — M1710 Unilateral primary osteoarthritis, unspecified knee: Secondary | ICD-10-CM | POA: Diagnosis not present

## 2014-02-22 DIAGNOSIS — R262 Difficulty in walking, not elsewhere classified: Secondary | ICD-10-CM | POA: Diagnosis not present

## 2014-02-22 DIAGNOSIS — M1710 Unilateral primary osteoarthritis, unspecified knee: Secondary | ICD-10-CM | POA: Diagnosis not present

## 2014-02-22 DIAGNOSIS — I509 Heart failure, unspecified: Secondary | ICD-10-CM | POA: Diagnosis not present

## 2014-02-22 DIAGNOSIS — M25569 Pain in unspecified knee: Secondary | ICD-10-CM | POA: Diagnosis not present

## 2014-02-22 DIAGNOSIS — M6281 Muscle weakness (generalized): Secondary | ICD-10-CM | POA: Diagnosis not present

## 2014-02-25 ENCOUNTER — Other Ambulatory Visit: Payer: Self-pay | Admitting: Family Medicine

## 2014-02-25 DIAGNOSIS — I509 Heart failure, unspecified: Secondary | ICD-10-CM | POA: Diagnosis not present

## 2014-02-25 DIAGNOSIS — M1710 Unilateral primary osteoarthritis, unspecified knee: Secondary | ICD-10-CM | POA: Diagnosis not present

## 2014-02-25 DIAGNOSIS — R262 Difficulty in walking, not elsewhere classified: Secondary | ICD-10-CM | POA: Diagnosis not present

## 2014-02-25 DIAGNOSIS — M25569 Pain in unspecified knee: Secondary | ICD-10-CM | POA: Diagnosis not present

## 2014-02-25 DIAGNOSIS — M6281 Muscle weakness (generalized): Secondary | ICD-10-CM | POA: Diagnosis not present

## 2014-02-26 DIAGNOSIS — M6281 Muscle weakness (generalized): Secondary | ICD-10-CM | POA: Diagnosis not present

## 2014-02-26 DIAGNOSIS — M25569 Pain in unspecified knee: Secondary | ICD-10-CM | POA: Diagnosis not present

## 2014-02-26 DIAGNOSIS — R262 Difficulty in walking, not elsewhere classified: Secondary | ICD-10-CM | POA: Diagnosis not present

## 2014-02-26 DIAGNOSIS — M1710 Unilateral primary osteoarthritis, unspecified knee: Secondary | ICD-10-CM | POA: Diagnosis not present

## 2014-02-26 DIAGNOSIS — I509 Heart failure, unspecified: Secondary | ICD-10-CM | POA: Diagnosis not present

## 2014-02-27 ENCOUNTER — Encounter: Payer: Self-pay | Admitting: Family Medicine

## 2014-02-27 DIAGNOSIS — M6281 Muscle weakness (generalized): Secondary | ICD-10-CM | POA: Diagnosis not present

## 2014-02-27 DIAGNOSIS — R262 Difficulty in walking, not elsewhere classified: Secondary | ICD-10-CM | POA: Diagnosis not present

## 2014-02-27 DIAGNOSIS — I509 Heart failure, unspecified: Secondary | ICD-10-CM | POA: Diagnosis not present

## 2014-02-27 DIAGNOSIS — M1710 Unilateral primary osteoarthritis, unspecified knee: Secondary | ICD-10-CM | POA: Diagnosis not present

## 2014-02-27 DIAGNOSIS — M25569 Pain in unspecified knee: Secondary | ICD-10-CM | POA: Diagnosis not present

## 2014-02-28 DIAGNOSIS — M1710 Unilateral primary osteoarthritis, unspecified knee: Secondary | ICD-10-CM | POA: Diagnosis not present

## 2014-02-28 DIAGNOSIS — I509 Heart failure, unspecified: Secondary | ICD-10-CM | POA: Diagnosis not present

## 2014-02-28 DIAGNOSIS — M6281 Muscle weakness (generalized): Secondary | ICD-10-CM | POA: Diagnosis not present

## 2014-02-28 DIAGNOSIS — M25569 Pain in unspecified knee: Secondary | ICD-10-CM | POA: Diagnosis not present

## 2014-02-28 DIAGNOSIS — R262 Difficulty in walking, not elsewhere classified: Secondary | ICD-10-CM | POA: Diagnosis not present

## 2014-03-01 DIAGNOSIS — M25569 Pain in unspecified knee: Secondary | ICD-10-CM | POA: Diagnosis not present

## 2014-03-01 DIAGNOSIS — R262 Difficulty in walking, not elsewhere classified: Secondary | ICD-10-CM | POA: Diagnosis not present

## 2014-03-01 DIAGNOSIS — I509 Heart failure, unspecified: Secondary | ICD-10-CM | POA: Diagnosis not present

## 2014-03-01 DIAGNOSIS — M6281 Muscle weakness (generalized): Secondary | ICD-10-CM | POA: Diagnosis not present

## 2014-03-01 DIAGNOSIS — M1710 Unilateral primary osteoarthritis, unspecified knee: Secondary | ICD-10-CM | POA: Diagnosis not present

## 2014-03-04 ENCOUNTER — Other Ambulatory Visit: Payer: Self-pay | Admitting: Family Medicine

## 2014-03-04 DIAGNOSIS — M25569 Pain in unspecified knee: Secondary | ICD-10-CM | POA: Diagnosis not present

## 2014-03-04 DIAGNOSIS — I509 Heart failure, unspecified: Secondary | ICD-10-CM | POA: Diagnosis not present

## 2014-03-04 DIAGNOSIS — M1710 Unilateral primary osteoarthritis, unspecified knee: Secondary | ICD-10-CM | POA: Diagnosis not present

## 2014-03-04 DIAGNOSIS — R262 Difficulty in walking, not elsewhere classified: Secondary | ICD-10-CM | POA: Diagnosis not present

## 2014-03-04 DIAGNOSIS — M6281 Muscle weakness (generalized): Secondary | ICD-10-CM | POA: Diagnosis not present

## 2014-03-05 DIAGNOSIS — I509 Heart failure, unspecified: Secondary | ICD-10-CM | POA: Diagnosis not present

## 2014-03-05 DIAGNOSIS — M6281 Muscle weakness (generalized): Secondary | ICD-10-CM | POA: Diagnosis not present

## 2014-03-05 DIAGNOSIS — M25569 Pain in unspecified knee: Secondary | ICD-10-CM | POA: Diagnosis not present

## 2014-03-05 DIAGNOSIS — M1710 Unilateral primary osteoarthritis, unspecified knee: Secondary | ICD-10-CM | POA: Diagnosis not present

## 2014-03-05 DIAGNOSIS — R262 Difficulty in walking, not elsewhere classified: Secondary | ICD-10-CM | POA: Diagnosis not present

## 2014-03-06 DIAGNOSIS — R262 Difficulty in walking, not elsewhere classified: Secondary | ICD-10-CM | POA: Diagnosis not present

## 2014-03-06 DIAGNOSIS — M6281 Muscle weakness (generalized): Secondary | ICD-10-CM | POA: Diagnosis not present

## 2014-03-06 DIAGNOSIS — M1710 Unilateral primary osteoarthritis, unspecified knee: Secondary | ICD-10-CM | POA: Diagnosis not present

## 2014-03-06 DIAGNOSIS — I509 Heart failure, unspecified: Secondary | ICD-10-CM | POA: Diagnosis not present

## 2014-03-06 DIAGNOSIS — M25569 Pain in unspecified knee: Secondary | ICD-10-CM | POA: Diagnosis not present

## 2014-03-07 DIAGNOSIS — M25569 Pain in unspecified knee: Secondary | ICD-10-CM | POA: Diagnosis not present

## 2014-03-07 DIAGNOSIS — M6281 Muscle weakness (generalized): Secondary | ICD-10-CM | POA: Diagnosis not present

## 2014-03-07 DIAGNOSIS — I509 Heart failure, unspecified: Secondary | ICD-10-CM | POA: Diagnosis not present

## 2014-03-07 DIAGNOSIS — R262 Difficulty in walking, not elsewhere classified: Secondary | ICD-10-CM | POA: Diagnosis not present

## 2014-03-07 DIAGNOSIS — M1710 Unilateral primary osteoarthritis, unspecified knee: Secondary | ICD-10-CM | POA: Diagnosis not present

## 2014-03-08 DIAGNOSIS — I509 Heart failure, unspecified: Secondary | ICD-10-CM | POA: Diagnosis not present

## 2014-03-08 DIAGNOSIS — R262 Difficulty in walking, not elsewhere classified: Secondary | ICD-10-CM | POA: Diagnosis not present

## 2014-03-08 DIAGNOSIS — M25569 Pain in unspecified knee: Secondary | ICD-10-CM | POA: Diagnosis not present

## 2014-03-08 DIAGNOSIS — M6281 Muscle weakness (generalized): Secondary | ICD-10-CM | POA: Diagnosis not present

## 2014-03-08 DIAGNOSIS — M1710 Unilateral primary osteoarthritis, unspecified knee: Secondary | ICD-10-CM | POA: Diagnosis not present

## 2014-03-11 DIAGNOSIS — I509 Heart failure, unspecified: Secondary | ICD-10-CM | POA: Diagnosis not present

## 2014-03-11 DIAGNOSIS — M6281 Muscle weakness (generalized): Secondary | ICD-10-CM | POA: Diagnosis not present

## 2014-03-11 DIAGNOSIS — R262 Difficulty in walking, not elsewhere classified: Secondary | ICD-10-CM | POA: Diagnosis not present

## 2014-03-11 DIAGNOSIS — M25569 Pain in unspecified knee: Secondary | ICD-10-CM | POA: Diagnosis not present

## 2014-03-11 DIAGNOSIS — M1710 Unilateral primary osteoarthritis, unspecified knee: Secondary | ICD-10-CM | POA: Diagnosis not present

## 2014-03-12 DIAGNOSIS — I509 Heart failure, unspecified: Secondary | ICD-10-CM | POA: Diagnosis not present

## 2014-03-12 DIAGNOSIS — M25569 Pain in unspecified knee: Secondary | ICD-10-CM | POA: Diagnosis not present

## 2014-03-12 DIAGNOSIS — M1710 Unilateral primary osteoarthritis, unspecified knee: Secondary | ICD-10-CM | POA: Diagnosis not present

## 2014-03-12 DIAGNOSIS — R262 Difficulty in walking, not elsewhere classified: Secondary | ICD-10-CM | POA: Diagnosis not present

## 2014-03-12 DIAGNOSIS — M6281 Muscle weakness (generalized): Secondary | ICD-10-CM | POA: Diagnosis not present

## 2014-03-15 DIAGNOSIS — R262 Difficulty in walking, not elsewhere classified: Secondary | ICD-10-CM | POA: Diagnosis not present

## 2014-03-15 DIAGNOSIS — M25569 Pain in unspecified knee: Secondary | ICD-10-CM | POA: Diagnosis not present

## 2014-03-15 DIAGNOSIS — M1710 Unilateral primary osteoarthritis, unspecified knee: Secondary | ICD-10-CM | POA: Diagnosis not present

## 2014-03-15 DIAGNOSIS — I509 Heart failure, unspecified: Secondary | ICD-10-CM | POA: Diagnosis not present

## 2014-03-15 DIAGNOSIS — M6281 Muscle weakness (generalized): Secondary | ICD-10-CM | POA: Diagnosis not present

## 2014-03-18 ENCOUNTER — Other Ambulatory Visit: Payer: Self-pay | Admitting: Family Medicine

## 2014-03-18 NOTE — Telephone Encounter (Signed)
Seen 3/26

## 2014-03-18 NOTE — Telephone Encounter (Signed)
Ok plus five monthly ref 

## 2014-04-05 ENCOUNTER — Telehealth: Payer: Self-pay | Admitting: Family Medicine

## 2014-04-05 MED ORDER — OMEPRAZOLE 20 MG PO CPDR: 20.0000 mg | DELAYED_RELEASE_CAPSULE | Freq: Every day | ORAL | Status: DC

## 2014-04-05 NOTE — Telephone Encounter (Signed)
Medication sent in to pharmacy. Order faxed over to [facility]. Notified [name] of changes.

## 2014-04-05 NOTE — Telephone Encounter (Signed)
Mr. Crull having a lot of Reflux at night. He is unable to sleep. [facility] needs one of the doctors to send an order over for some medication to help his reflux before the weekend.

## 2014-04-05 NOTE — Telephone Encounter (Signed)
Discontinue ranitidine. Prescribe omeprazole 20 mg every morning, #30 6 refills, also keep head of bed elevated at 10-20. Also minimize tomato-based products orange juice especially later in the evening. He should followup if ongoing troubles.

## 2014-04-09 ENCOUNTER — Other Ambulatory Visit: Payer: Self-pay | Admitting: *Deleted

## 2014-04-09 ENCOUNTER — Telehealth: Payer: Self-pay | Admitting: Family Medicine

## 2014-04-09 MED ORDER — OMEPRAZOLE 20 MG PO CPDR: 20.0000 mg | DELAYED_RELEASE_CAPSULE | Freq: Every day | ORAL | Status: DC

## 2014-04-09 NOTE — Telephone Encounter (Signed)
Patient says that [facility] said they do not have the indigestion medication that was sent in. Can we resend in to them and [facility]?

## 2014-04-09 NOTE — Telephone Encounter (Signed)
I resent the med to both [facility] and [facility]

## 2014-04-15 DIAGNOSIS — M6281 Muscle weakness (generalized): Secondary | ICD-10-CM | POA: Diagnosis not present

## 2014-04-15 DIAGNOSIS — R262 Difficulty in walking, not elsewhere classified: Secondary | ICD-10-CM | POA: Diagnosis not present

## 2014-04-15 DIAGNOSIS — M25569 Pain in unspecified knee: Secondary | ICD-10-CM | POA: Diagnosis not present

## 2014-04-15 DIAGNOSIS — M1710 Unilateral primary osteoarthritis, unspecified knee: Secondary | ICD-10-CM | POA: Diagnosis not present

## 2014-04-15 DIAGNOSIS — I509 Heart failure, unspecified: Secondary | ICD-10-CM | POA: Diagnosis not present

## 2014-04-16 ENCOUNTER — Telehealth: Payer: Self-pay | Admitting: Family Medicine

## 2014-04-16 MED ORDER — OMEPRAZOLE 20 MG PO CPDR: 20.0000 mg | DELAYED_RELEASE_CAPSULE | Freq: Two times a day (BID) | ORAL | Status: DC

## 2014-04-16 NOTE — Telephone Encounter (Signed)
[facility] calling due to family asking if they can increase Christian Thomas's  Mylanta or give him something else for his indigestion?   Call Christian Thomas if you have further questions   Please do an order an fax if we can change anything or add too  [phone]

## 2014-04-16 NOTE — Telephone Encounter (Signed)
Nurses, plz call and spk with pt's clinicaql nurse to get the exact symtomatology, plus what he's taking and how often for it

## 2014-04-16 NOTE — Telephone Encounter (Signed)
Medication sent to pharmacy. Faxed script to [facility]. [place] notified.

## 2014-04-16 NOTE — Telephone Encounter (Signed)
Christian Thomas states that patient has indigestion. He takes Zantac 300 mg qd, Prilosec 20 mg qd and Mylanta 15 cc qd prn.

## 2014-04-16 NOTE — Telephone Encounter (Signed)
Increase prolosec to bid, ck in office if persists

## 2014-04-17 ENCOUNTER — Other Ambulatory Visit: Payer: Self-pay | Admitting: Family Medicine

## 2014-04-17 DIAGNOSIS — M6281 Muscle weakness (generalized): Secondary | ICD-10-CM | POA: Diagnosis not present

## 2014-04-17 DIAGNOSIS — I509 Heart failure, unspecified: Secondary | ICD-10-CM | POA: Diagnosis not present

## 2014-04-17 DIAGNOSIS — E1159 Type 2 diabetes mellitus with other circulatory complications: Secondary | ICD-10-CM | POA: Diagnosis not present

## 2014-04-17 DIAGNOSIS — M25569 Pain in unspecified knee: Secondary | ICD-10-CM | POA: Diagnosis not present

## 2014-04-17 DIAGNOSIS — R262 Difficulty in walking, not elsewhere classified: Secondary | ICD-10-CM | POA: Diagnosis not present

## 2014-04-17 DIAGNOSIS — M1710 Unilateral primary osteoarthritis, unspecified knee: Secondary | ICD-10-CM | POA: Diagnosis not present

## 2014-04-18 DIAGNOSIS — M1710 Unilateral primary osteoarthritis, unspecified knee: Secondary | ICD-10-CM | POA: Diagnosis not present

## 2014-04-18 DIAGNOSIS — R262 Difficulty in walking, not elsewhere classified: Secondary | ICD-10-CM | POA: Diagnosis not present

## 2014-04-18 DIAGNOSIS — M25569 Pain in unspecified knee: Secondary | ICD-10-CM | POA: Diagnosis not present

## 2014-04-18 DIAGNOSIS — I509 Heart failure, unspecified: Secondary | ICD-10-CM | POA: Diagnosis not present

## 2014-04-18 DIAGNOSIS — M6281 Muscle weakness (generalized): Secondary | ICD-10-CM | POA: Diagnosis not present

## 2014-04-22 ENCOUNTER — Other Ambulatory Visit: Payer: Self-pay | Admitting: Family Medicine

## 2014-04-22 DIAGNOSIS — M1710 Unilateral primary osteoarthritis, unspecified knee: Secondary | ICD-10-CM | POA: Diagnosis not present

## 2014-04-22 DIAGNOSIS — I509 Heart failure, unspecified: Secondary | ICD-10-CM | POA: Diagnosis not present

## 2014-04-22 DIAGNOSIS — M6281 Muscle weakness (generalized): Secondary | ICD-10-CM | POA: Diagnosis not present

## 2014-04-22 DIAGNOSIS — R262 Difficulty in walking, not elsewhere classified: Secondary | ICD-10-CM | POA: Diagnosis not present

## 2014-04-22 DIAGNOSIS — M25569 Pain in unspecified knee: Secondary | ICD-10-CM | POA: Diagnosis not present

## 2014-04-24 DIAGNOSIS — R262 Difficulty in walking, not elsewhere classified: Secondary | ICD-10-CM | POA: Diagnosis not present

## 2014-04-24 DIAGNOSIS — M25569 Pain in unspecified knee: Secondary | ICD-10-CM | POA: Diagnosis not present

## 2014-04-24 DIAGNOSIS — M6281 Muscle weakness (generalized): Secondary | ICD-10-CM | POA: Diagnosis not present

## 2014-04-24 DIAGNOSIS — I509 Heart failure, unspecified: Secondary | ICD-10-CM | POA: Diagnosis not present

## 2014-04-24 DIAGNOSIS — M1710 Unilateral primary osteoarthritis, unspecified knee: Secondary | ICD-10-CM | POA: Diagnosis not present

## 2014-04-26 DIAGNOSIS — R262 Difficulty in walking, not elsewhere classified: Secondary | ICD-10-CM | POA: Diagnosis not present

## 2014-04-26 DIAGNOSIS — I509 Heart failure, unspecified: Secondary | ICD-10-CM | POA: Diagnosis not present

## 2014-04-26 DIAGNOSIS — M6281 Muscle weakness (generalized): Secondary | ICD-10-CM | POA: Diagnosis not present

## 2014-04-26 DIAGNOSIS — M25569 Pain in unspecified knee: Secondary | ICD-10-CM | POA: Diagnosis not present

## 2014-04-26 DIAGNOSIS — M1710 Unilateral primary osteoarthritis, unspecified knee: Secondary | ICD-10-CM | POA: Diagnosis not present

## 2014-04-29 DIAGNOSIS — M6281 Muscle weakness (generalized): Secondary | ICD-10-CM | POA: Diagnosis not present

## 2014-04-29 DIAGNOSIS — I509 Heart failure, unspecified: Secondary | ICD-10-CM | POA: Diagnosis not present

## 2014-04-29 DIAGNOSIS — R262 Difficulty in walking, not elsewhere classified: Secondary | ICD-10-CM | POA: Diagnosis not present

## 2014-04-29 DIAGNOSIS — M25569 Pain in unspecified knee: Secondary | ICD-10-CM | POA: Diagnosis not present

## 2014-04-29 DIAGNOSIS — M1710 Unilateral primary osteoarthritis, unspecified knee: Secondary | ICD-10-CM | POA: Diagnosis not present

## 2014-05-01 DIAGNOSIS — M1710 Unilateral primary osteoarthritis, unspecified knee: Secondary | ICD-10-CM | POA: Diagnosis not present

## 2014-05-01 DIAGNOSIS — I509 Heart failure, unspecified: Secondary | ICD-10-CM | POA: Diagnosis not present

## 2014-05-01 DIAGNOSIS — M25569 Pain in unspecified knee: Secondary | ICD-10-CM | POA: Diagnosis not present

## 2014-05-01 DIAGNOSIS — R262 Difficulty in walking, not elsewhere classified: Secondary | ICD-10-CM | POA: Diagnosis not present

## 2014-05-01 DIAGNOSIS — M6281 Muscle weakness (generalized): Secondary | ICD-10-CM | POA: Diagnosis not present

## 2014-05-03 DIAGNOSIS — M6281 Muscle weakness (generalized): Secondary | ICD-10-CM | POA: Diagnosis not present

## 2014-05-03 DIAGNOSIS — I509 Heart failure, unspecified: Secondary | ICD-10-CM | POA: Diagnosis not present

## 2014-05-03 DIAGNOSIS — R262 Difficulty in walking, not elsewhere classified: Secondary | ICD-10-CM | POA: Diagnosis not present

## 2014-05-03 DIAGNOSIS — M1710 Unilateral primary osteoarthritis, unspecified knee: Secondary | ICD-10-CM | POA: Diagnosis not present

## 2014-05-03 DIAGNOSIS — M25569 Pain in unspecified knee: Secondary | ICD-10-CM | POA: Diagnosis not present

## 2014-05-06 ENCOUNTER — Other Ambulatory Visit: Payer: Self-pay | Admitting: Family Medicine

## 2014-05-06 DIAGNOSIS — M25569 Pain in unspecified knee: Secondary | ICD-10-CM | POA: Diagnosis not present

## 2014-05-06 DIAGNOSIS — I509 Heart failure, unspecified: Secondary | ICD-10-CM | POA: Diagnosis not present

## 2014-05-06 DIAGNOSIS — R262 Difficulty in walking, not elsewhere classified: Secondary | ICD-10-CM | POA: Diagnosis not present

## 2014-05-06 DIAGNOSIS — M6281 Muscle weakness (generalized): Secondary | ICD-10-CM | POA: Diagnosis not present

## 2014-05-06 DIAGNOSIS — M1710 Unilateral primary osteoarthritis, unspecified knee: Secondary | ICD-10-CM | POA: Diagnosis not present

## 2014-05-07 ENCOUNTER — Ambulatory Visit (INDEPENDENT_AMBULATORY_CARE_PROVIDER_SITE_OTHER): Payer: Medicare Other | Admitting: Family Medicine

## 2014-05-07 ENCOUNTER — Encounter: Payer: Self-pay | Admitting: Family Medicine

## 2014-05-07 VITALS — BP 150/76 | Ht 74.0 in | Wt 184.1 lb

## 2014-05-07 DIAGNOSIS — E119 Type 2 diabetes mellitus without complications: Secondary | ICD-10-CM

## 2014-05-07 DIAGNOSIS — E1142 Type 2 diabetes mellitus with diabetic polyneuropathy: Secondary | ICD-10-CM

## 2014-05-07 DIAGNOSIS — H6120 Impacted cerumen, unspecified ear: Secondary | ICD-10-CM | POA: Diagnosis not present

## 2014-05-07 DIAGNOSIS — E1149 Type 2 diabetes mellitus with other diabetic neurological complication: Secondary | ICD-10-CM

## 2014-05-07 DIAGNOSIS — IMO0002 Reserved for concepts with insufficient information to code with codable children: Secondary | ICD-10-CM

## 2014-05-07 DIAGNOSIS — G609 Hereditary and idiopathic neuropathy, unspecified: Secondary | ICD-10-CM | POA: Diagnosis not present

## 2014-05-07 DIAGNOSIS — M1710 Unilateral primary osteoarthritis, unspecified knee: Secondary | ICD-10-CM

## 2014-05-07 DIAGNOSIS — M170 Bilateral primary osteoarthritis of knee: Secondary | ICD-10-CM

## 2014-05-07 DIAGNOSIS — I10 Essential (primary) hypertension: Secondary | ICD-10-CM | POA: Diagnosis not present

## 2014-05-07 LAB — POCT GLYCOSYLATED HEMOGLOBIN (HGB A1C): HEMOGLOBIN A1C: 6.3

## 2014-05-07 NOTE — Progress Notes (Signed)
   Subjective:    Patient ID: Christian Thomas, male    DOB: [birth year], 80 y.o.   MRN: [id number]  Diabetes He presents for his follow-up diabetic visit. He has type 2 diabetes mellitus. His disease course has been stable. There are no hypoglycemic associated symptoms. There are no diabetic associated symptoms. There are no hypoglycemic complications. Symptoms are stable. There are no diabetic complications. There are no known risk factors for coronary artery disease. Current diabetic treatment includes oral agent (monotherapy). He is compliant with treatment all of the time.  A1C is 6.3 today.  Patient states he has urinary frequency only at night time. Actually on further history he states he is getting a multiple times per night. In the past was not able to handle Flomax.   Patient states that his indigestion is a lot better.    Patient wants knees injected if it is time and he w. Notes a lot of difficulty with knee pain. Bilateral. Injections of helped in the past.  Ongoing challenges with tremor. No better. No worse.  Has lesions on his back that are concerning him.  Notes progressive trouble hearing. Thinks he may need his ears washed out.  Results for orders placed in visit on 05/07/14  POCT GLYCOSYLATED HEMOGLOBIN (HGB A1C)      Result Value Ref Range   Hemoglobin A1C 6.3     ants his ears cleaned today also.    Review of Systems No headache no chest pain some back pains ongoing severe knee pain. Diminished energy at times overall sleeping well no change in bowel habits    Objective:   Physical Exam Alert no apparent distress. HEENT ears packed with wax neck supple. Lungs clear. Heart regular in rhythm. Multiple seborrheic keratoses noted on the back. Abdominal exam benign. Knees bilateral significant crepitation slight effusion. Ankles no significant edema. See diabetic foot exam.  Ear wax removed with some difficulty.  Procedure note patient was prepped draped  anesthetized with both knees. 1 cc Depo-Medrol 2 cc Xylocaine were injected into both knees.       Assessment & Plan:  Impression 1 type 2 diabetes control overall good. #2 reflux multiple phone call recently currently stable per patient. #3 nocturia we are at and in the past he should not be on any further medicines right now. If worsens may need urologist. #4 in the injection given. Wound management discussed. #5 seborrheic care to OCs patient reassured. #6 hyperlipidemia discussed plan diet discussed exercise discussed. Maintain same meds. Postinjection management discussed WSL

## 2014-05-08 DIAGNOSIS — M1710 Unilateral primary osteoarthritis, unspecified knee: Secondary | ICD-10-CM | POA: Diagnosis not present

## 2014-05-08 DIAGNOSIS — I509 Heart failure, unspecified: Secondary | ICD-10-CM | POA: Diagnosis not present

## 2014-05-08 DIAGNOSIS — R262 Difficulty in walking, not elsewhere classified: Secondary | ICD-10-CM | POA: Diagnosis not present

## 2014-05-08 DIAGNOSIS — M6281 Muscle weakness (generalized): Secondary | ICD-10-CM | POA: Diagnosis not present

## 2014-05-08 DIAGNOSIS — M25569 Pain in unspecified knee: Secondary | ICD-10-CM | POA: Diagnosis not present

## 2014-05-09 ENCOUNTER — Ambulatory Visit: Payer: Medicare Other | Admitting: Family Medicine

## 2014-05-10 DIAGNOSIS — M25569 Pain in unspecified knee: Secondary | ICD-10-CM | POA: Diagnosis not present

## 2014-05-10 DIAGNOSIS — M6281 Muscle weakness (generalized): Secondary | ICD-10-CM | POA: Diagnosis not present

## 2014-05-10 DIAGNOSIS — R262 Difficulty in walking, not elsewhere classified: Secondary | ICD-10-CM | POA: Diagnosis not present

## 2014-05-10 DIAGNOSIS — M1710 Unilateral primary osteoarthritis, unspecified knee: Secondary | ICD-10-CM | POA: Diagnosis not present

## 2014-05-10 DIAGNOSIS — I509 Heart failure, unspecified: Secondary | ICD-10-CM | POA: Diagnosis not present

## 2014-05-13 ENCOUNTER — Other Ambulatory Visit: Payer: Self-pay | Admitting: Family Medicine

## 2014-05-13 DIAGNOSIS — R262 Difficulty in walking, not elsewhere classified: Secondary | ICD-10-CM | POA: Diagnosis not present

## 2014-05-13 DIAGNOSIS — M25569 Pain in unspecified knee: Secondary | ICD-10-CM | POA: Diagnosis not present

## 2014-05-13 DIAGNOSIS — M6281 Muscle weakness (generalized): Secondary | ICD-10-CM | POA: Diagnosis not present

## 2014-05-13 DIAGNOSIS — M1710 Unilateral primary osteoarthritis, unspecified knee: Secondary | ICD-10-CM | POA: Diagnosis not present

## 2014-05-13 DIAGNOSIS — I509 Heart failure, unspecified: Secondary | ICD-10-CM | POA: Diagnosis not present

## 2014-05-14 DIAGNOSIS — M25569 Pain in unspecified knee: Secondary | ICD-10-CM | POA: Diagnosis not present

## 2014-05-14 DIAGNOSIS — R262 Difficulty in walking, not elsewhere classified: Secondary | ICD-10-CM | POA: Diagnosis not present

## 2014-05-14 DIAGNOSIS — M6281 Muscle weakness (generalized): Secondary | ICD-10-CM | POA: Diagnosis not present

## 2014-05-14 DIAGNOSIS — I509 Heart failure, unspecified: Secondary | ICD-10-CM | POA: Diagnosis not present

## 2014-05-14 DIAGNOSIS — M1710 Unilateral primary osteoarthritis, unspecified knee: Secondary | ICD-10-CM | POA: Diagnosis not present

## 2014-05-16 DIAGNOSIS — M6281 Muscle weakness (generalized): Secondary | ICD-10-CM | POA: Diagnosis not present

## 2014-05-16 DIAGNOSIS — R262 Difficulty in walking, not elsewhere classified: Secondary | ICD-10-CM | POA: Diagnosis not present

## 2014-05-16 DIAGNOSIS — I509 Heart failure, unspecified: Secondary | ICD-10-CM | POA: Diagnosis not present

## 2014-05-16 DIAGNOSIS — M1710 Unilateral primary osteoarthritis, unspecified knee: Secondary | ICD-10-CM | POA: Diagnosis not present

## 2014-05-16 DIAGNOSIS — M25569 Pain in unspecified knee: Secondary | ICD-10-CM | POA: Diagnosis not present

## 2014-05-20 DIAGNOSIS — M6281 Muscle weakness (generalized): Secondary | ICD-10-CM | POA: Diagnosis not present

## 2014-05-20 DIAGNOSIS — R262 Difficulty in walking, not elsewhere classified: Secondary | ICD-10-CM | POA: Diagnosis not present

## 2014-05-20 DIAGNOSIS — I509 Heart failure, unspecified: Secondary | ICD-10-CM | POA: Diagnosis not present

## 2014-05-20 DIAGNOSIS — M1710 Unilateral primary osteoarthritis, unspecified knee: Secondary | ICD-10-CM | POA: Diagnosis not present

## 2014-05-20 DIAGNOSIS — M25569 Pain in unspecified knee: Secondary | ICD-10-CM | POA: Diagnosis not present

## 2014-05-22 DIAGNOSIS — M1710 Unilateral primary osteoarthritis, unspecified knee: Secondary | ICD-10-CM | POA: Diagnosis not present

## 2014-05-22 DIAGNOSIS — R262 Difficulty in walking, not elsewhere classified: Secondary | ICD-10-CM | POA: Diagnosis not present

## 2014-05-22 DIAGNOSIS — M25569 Pain in unspecified knee: Secondary | ICD-10-CM | POA: Diagnosis not present

## 2014-05-22 DIAGNOSIS — M6281 Muscle weakness (generalized): Secondary | ICD-10-CM | POA: Diagnosis not present

## 2014-05-22 DIAGNOSIS — I509 Heart failure, unspecified: Secondary | ICD-10-CM | POA: Diagnosis not present

## 2014-05-23 ENCOUNTER — Other Ambulatory Visit: Payer: Self-pay | Admitting: Family Medicine

## 2014-05-23 ENCOUNTER — Other Ambulatory Visit: Payer: Self-pay | Admitting: *Deleted

## 2014-05-23 MED ORDER — ALUM & MAG HYDROXIDE-SIMETH 200-200-20 MG/5ML PO SUSP: ORAL | Status: DC

## 2014-05-24 ENCOUNTER — Other Ambulatory Visit: Payer: Self-pay | Admitting: Family Medicine

## 2014-05-24 ENCOUNTER — Other Ambulatory Visit: Payer: Self-pay | Admitting: *Deleted

## 2014-05-24 DIAGNOSIS — R262 Difficulty in walking, not elsewhere classified: Secondary | ICD-10-CM | POA: Diagnosis not present

## 2014-05-24 DIAGNOSIS — M25569 Pain in unspecified knee: Secondary | ICD-10-CM | POA: Diagnosis not present

## 2014-05-24 DIAGNOSIS — M1710 Unilateral primary osteoarthritis, unspecified knee: Secondary | ICD-10-CM | POA: Diagnosis not present

## 2014-05-24 DIAGNOSIS — M6281 Muscle weakness (generalized): Secondary | ICD-10-CM | POA: Diagnosis not present

## 2014-05-24 DIAGNOSIS — I509 Heart failure, unspecified: Secondary | ICD-10-CM | POA: Diagnosis not present

## 2014-05-27 DIAGNOSIS — R262 Difficulty in walking, not elsewhere classified: Secondary | ICD-10-CM | POA: Diagnosis not present

## 2014-05-27 DIAGNOSIS — M1710 Unilateral primary osteoarthritis, unspecified knee: Secondary | ICD-10-CM | POA: Diagnosis not present

## 2014-05-27 DIAGNOSIS — I509 Heart failure, unspecified: Secondary | ICD-10-CM | POA: Diagnosis not present

## 2014-05-27 DIAGNOSIS — M25569 Pain in unspecified knee: Secondary | ICD-10-CM | POA: Diagnosis not present

## 2014-05-27 DIAGNOSIS — M6281 Muscle weakness (generalized): Secondary | ICD-10-CM | POA: Diagnosis not present

## 2014-05-29 ENCOUNTER — Telehealth: Payer: Self-pay | Admitting: Family Medicine

## 2014-05-29 DIAGNOSIS — M1710 Unilateral primary osteoarthritis, unspecified knee: Secondary | ICD-10-CM | POA: Diagnosis not present

## 2014-05-29 DIAGNOSIS — I509 Heart failure, unspecified: Secondary | ICD-10-CM | POA: Diagnosis not present

## 2014-05-29 DIAGNOSIS — M6281 Muscle weakness (generalized): Secondary | ICD-10-CM | POA: Diagnosis not present

## 2014-05-29 DIAGNOSIS — M25569 Pain in unspecified knee: Secondary | ICD-10-CM | POA: Diagnosis not present

## 2014-05-29 DIAGNOSIS — R262 Difficulty in walking, not elsewhere classified: Secondary | ICD-10-CM | POA: Diagnosis not present

## 2014-05-29 NOTE — Telephone Encounter (Signed)
Christian Thomas is having more an more bouts with anxiety The family an the nursing home would like for him to be  Put back on some xanax for this.   You may call back to get [name] or a Med Tech  [phone]   ALPRAZolam (XANAX) 0.25 MG tablet [324]

## 2014-05-29 NOTE — Telephone Encounter (Signed)
Nurses syndrome prescription for Xanax 0.25 mg I recommend half of a tablet when needed for anxiety. Talk with the medical staff at his rest home find out if this can be ordered half of a tablet q. 8 hours when necessary anxiety? Let me know.

## 2014-05-30 MED ORDER — ALPRAZOLAM 0.25 MG PO TABS: ORAL_TABLET | ORAL | Status: DC

## 2014-05-30 NOTE — Telephone Encounter (Signed)
Yes they would like for him to get a refill on the xanax for 1/2 tablet q8hrs PRN please.

## 2014-05-30 NOTE — Telephone Encounter (Signed)
Loree Fee was called and notified.

## 2014-05-30 NOTE — Telephone Encounter (Signed)
Xanax 0.25 , #24, 1/2 q8 hours prn anxiety , hold if sedated

## 2014-05-31 DIAGNOSIS — I509 Heart failure, unspecified: Secondary | ICD-10-CM | POA: Diagnosis not present

## 2014-05-31 DIAGNOSIS — R262 Difficulty in walking, not elsewhere classified: Secondary | ICD-10-CM | POA: Diagnosis not present

## 2014-05-31 DIAGNOSIS — M1710 Unilateral primary osteoarthritis, unspecified knee: Secondary | ICD-10-CM | POA: Diagnosis not present

## 2014-05-31 DIAGNOSIS — M6281 Muscle weakness (generalized): Secondary | ICD-10-CM | POA: Diagnosis not present

## 2014-05-31 DIAGNOSIS — M25569 Pain in unspecified knee: Secondary | ICD-10-CM | POA: Diagnosis not present

## 2014-06-03 ENCOUNTER — Other Ambulatory Visit: Payer: Self-pay | Admitting: Family Medicine

## 2014-06-03 DIAGNOSIS — M6281 Muscle weakness (generalized): Secondary | ICD-10-CM | POA: Diagnosis not present

## 2014-06-03 DIAGNOSIS — M25569 Pain in unspecified knee: Secondary | ICD-10-CM | POA: Diagnosis not present

## 2014-06-03 DIAGNOSIS — R262 Difficulty in walking, not elsewhere classified: Secondary | ICD-10-CM | POA: Diagnosis not present

## 2014-06-03 DIAGNOSIS — I509 Heart failure, unspecified: Secondary | ICD-10-CM | POA: Diagnosis not present

## 2014-06-03 DIAGNOSIS — M1710 Unilateral primary osteoarthritis, unspecified knee: Secondary | ICD-10-CM | POA: Diagnosis not present

## 2014-06-04 DIAGNOSIS — M6281 Muscle weakness (generalized): Secondary | ICD-10-CM | POA: Diagnosis not present

## 2014-06-04 DIAGNOSIS — R262 Difficulty in walking, not elsewhere classified: Secondary | ICD-10-CM | POA: Diagnosis not present

## 2014-06-04 DIAGNOSIS — M25569 Pain in unspecified knee: Secondary | ICD-10-CM | POA: Diagnosis not present

## 2014-06-04 DIAGNOSIS — I509 Heart failure, unspecified: Secondary | ICD-10-CM | POA: Diagnosis not present

## 2014-06-04 DIAGNOSIS — M1710 Unilateral primary osteoarthritis, unspecified knee: Secondary | ICD-10-CM | POA: Diagnosis not present

## 2014-06-05 DIAGNOSIS — M1710 Unilateral primary osteoarthritis, unspecified knee: Secondary | ICD-10-CM | POA: Diagnosis not present

## 2014-06-05 DIAGNOSIS — M25569 Pain in unspecified knee: Secondary | ICD-10-CM | POA: Diagnosis not present

## 2014-06-05 DIAGNOSIS — M6281 Muscle weakness (generalized): Secondary | ICD-10-CM | POA: Diagnosis not present

## 2014-06-05 DIAGNOSIS — I509 Heart failure, unspecified: Secondary | ICD-10-CM | POA: Diagnosis not present

## 2014-06-05 DIAGNOSIS — R262 Difficulty in walking, not elsewhere classified: Secondary | ICD-10-CM | POA: Diagnosis not present

## 2014-06-11 DIAGNOSIS — M1710 Unilateral primary osteoarthritis, unspecified knee: Secondary | ICD-10-CM | POA: Diagnosis not present

## 2014-06-11 DIAGNOSIS — R262 Difficulty in walking, not elsewhere classified: Secondary | ICD-10-CM | POA: Diagnosis not present

## 2014-06-11 DIAGNOSIS — M25569 Pain in unspecified knee: Secondary | ICD-10-CM | POA: Diagnosis not present

## 2014-06-11 DIAGNOSIS — M6281 Muscle weakness (generalized): Secondary | ICD-10-CM | POA: Diagnosis not present

## 2014-06-11 DIAGNOSIS — I509 Heart failure, unspecified: Secondary | ICD-10-CM | POA: Diagnosis not present

## 2014-06-12 DIAGNOSIS — M25569 Pain in unspecified knee: Secondary | ICD-10-CM | POA: Diagnosis not present

## 2014-06-12 DIAGNOSIS — M1710 Unilateral primary osteoarthritis, unspecified knee: Secondary | ICD-10-CM | POA: Diagnosis not present

## 2014-06-12 DIAGNOSIS — R262 Difficulty in walking, not elsewhere classified: Secondary | ICD-10-CM | POA: Diagnosis not present

## 2014-06-12 DIAGNOSIS — M6281 Muscle weakness (generalized): Secondary | ICD-10-CM | POA: Diagnosis not present

## 2014-06-12 DIAGNOSIS — I509 Heart failure, unspecified: Secondary | ICD-10-CM | POA: Diagnosis not present

## 2014-06-14 DIAGNOSIS — M1710 Unilateral primary osteoarthritis, unspecified knee: Secondary | ICD-10-CM | POA: Diagnosis not present

## 2014-06-14 DIAGNOSIS — M25569 Pain in unspecified knee: Secondary | ICD-10-CM | POA: Diagnosis not present

## 2014-06-14 DIAGNOSIS — M6281 Muscle weakness (generalized): Secondary | ICD-10-CM | POA: Diagnosis not present

## 2014-06-14 DIAGNOSIS — R262 Difficulty in walking, not elsewhere classified: Secondary | ICD-10-CM | POA: Diagnosis not present

## 2014-06-14 DIAGNOSIS — I509 Heart failure, unspecified: Secondary | ICD-10-CM | POA: Diagnosis not present

## 2014-06-21 ENCOUNTER — Other Ambulatory Visit: Payer: Self-pay | Admitting: Family Medicine

## 2014-06-24 ENCOUNTER — Other Ambulatory Visit: Payer: Self-pay | Admitting: Family Medicine

## 2014-07-02 ENCOUNTER — Other Ambulatory Visit: Payer: Self-pay | Admitting: Family Medicine

## 2014-07-03 ENCOUNTER — Other Ambulatory Visit: Payer: Self-pay | Admitting: *Deleted

## 2014-07-03 MED ORDER — ENALAPRIL MALEATE 10 MG PO TABS: 10.0000 mg | ORAL_TABLET | Freq: Two times a day (BID) | ORAL | Status: DC

## 2014-07-03 MED ORDER — ENALAPRIL MALEATE 10 MG PO TABS: 10.0000 mg | ORAL_TABLET | Freq: Every day | ORAL | Status: DC

## 2014-07-04 ENCOUNTER — Telehealth: Payer: Self-pay | Admitting: *Deleted

## 2014-07-04 NOTE — Telephone Encounter (Signed)
Pharm calling to clarify rx for enalapril. Pt has been on 5mg  BID. Yesterday script sent for 10mg  once daily and another for 10mg  BID. Consult with Dr. Richardson Landry pt was increased from 5mg  BID to 10mg  BID. Pharm. Notified correct RX is 10mg  one BID.

## 2014-07-10 DIAGNOSIS — E1159 Type 2 diabetes mellitus with other circulatory complications: Secondary | ICD-10-CM | POA: Diagnosis not present

## 2014-07-11 ENCOUNTER — Encounter: Payer: Self-pay | Admitting: Family Medicine

## 2014-07-11 ENCOUNTER — Ambulatory Visit (INDEPENDENT_AMBULATORY_CARE_PROVIDER_SITE_OTHER): Payer: Medicare Other | Admitting: Family Medicine

## 2014-07-11 VITALS — BP 142/76 | Ht 74.0 in | Wt 181.0 lb

## 2014-07-11 DIAGNOSIS — R42 Dizziness and giddiness: Secondary | ICD-10-CM | POA: Diagnosis not present

## 2014-07-11 DIAGNOSIS — K5909 Other constipation: Secondary | ICD-10-CM

## 2014-07-11 DIAGNOSIS — I10 Essential (primary) hypertension: Secondary | ICD-10-CM

## 2014-07-11 NOTE — Progress Notes (Signed)
   Subjective:    Patient ID: Christian Thomas, male    DOB: 1915-02-05, 80 y.o.   MRN: [id number]  HPI Patient arrives to discuss blood pressure. Patient's assisted living center would like his BP monitored thru home health. Assisted living also states that patient wants his PRN Mylanta every day and his prn lactulose every day and they need the order changed if he is to do it every day.  Patient has long-standing high blood pressure. We have been oscillating back and forth on dosage in depending on trending numbers. At this time numbers are generally elevated. We just increased his Vasotec within the past week.  The [facility] has expressed interest in potentially having any home health nurse monitor his numbers.  Ongoing challenges with reflux. Patient finds that if he takes the Mylanta every evening at helps considerably.  Also ongoing challenges with constipation. Use of lactulose everyday helped. Next  Ongoing challenges with of vertigo. Has not used Antivert for a while. Would like a refill. No headache no chest pain no back pain no abdominal pain no change in bowel habits no blood in stool  Concerned about possible skin ulcer on sacral region Review of Systems See above    Objective:   Physical Exam  Alert no apparent distress. Lungs clear. Heart regular in rhythm. Blood pressure still somewhat elevated on repeat 148/86. Ankles trace edema in the sacrum callus seen with no true skin ulcers.      Assessment & Plan:  Impression 1 hypertension patient is in a controlled environment with daily presence of nurses. Health referral declined. #2 reflux move it Maalox every day. #3 constipation ongoing #4 scan callus seen but no true ulcer discussed plan maintain same dose of Vasotec. Followup as scheduled. Call us if numbers get out of whack. They will continue to track daily at the [facility]. Change to every day Maalox. Maintain lactulose daily. Antivert when necessary. WSL

## 2014-07-26 ENCOUNTER — Other Ambulatory Visit: Payer: Self-pay | Admitting: Family Medicine

## 2014-07-30 ENCOUNTER — Other Ambulatory Visit: Payer: Self-pay | Admitting: Family Medicine

## 2014-08-01 DIAGNOSIS — Z23 Encounter for immunization: Secondary | ICD-10-CM | POA: Diagnosis not present

## 2014-08-09 ENCOUNTER — Encounter: Payer: Self-pay | Admitting: Family Medicine

## 2014-08-09 ENCOUNTER — Ambulatory Visit (INDEPENDENT_AMBULATORY_CARE_PROVIDER_SITE_OTHER): Payer: Medicare Other | Admitting: Family Medicine

## 2014-08-09 VITALS — BP 132/74 | Wt 185.0 lb

## 2014-08-09 DIAGNOSIS — N400 Enlarged prostate without lower urinary tract symptoms: Secondary | ICD-10-CM

## 2014-08-09 DIAGNOSIS — D539 Nutritional anemia, unspecified: Secondary | ICD-10-CM | POA: Diagnosis not present

## 2014-08-09 DIAGNOSIS — E118 Type 2 diabetes mellitus with unspecified complications: Secondary | ICD-10-CM

## 2014-08-09 DIAGNOSIS — E119 Type 2 diabetes mellitus without complications: Secondary | ICD-10-CM

## 2014-08-09 DIAGNOSIS — R350 Frequency of micturition: Secondary | ICD-10-CM

## 2014-08-09 DIAGNOSIS — I10 Essential (primary) hypertension: Secondary | ICD-10-CM

## 2014-08-09 DIAGNOSIS — M109 Gout, unspecified: Secondary | ICD-10-CM | POA: Diagnosis not present

## 2014-08-09 LAB — POCT GLYCOSYLATED HEMOGLOBIN (HGB A1C): Hemoglobin A1C: 6

## 2014-08-09 LAB — POCT URINALYSIS DIPSTICK
PH UA: 5
Spec Grav, UA: 1.015

## 2014-08-09 MED ORDER — KETOCONAZOLE 2 % EX CREA: TOPICAL_CREAM | CUTANEOUS | Status: DC

## 2014-08-09 MED ORDER — AMLODIPINE BESYLATE 2.5 MG PO TABS: 2.5000 mg | ORAL_TABLET | Freq: Every day | ORAL | Status: DC

## 2014-08-09 NOTE — Progress Notes (Signed)
   Subjective:    Patient ID: Christian Thomas, male    DOB: 03-Nov-1915, 80 y.o.   MRN: [id number]  Diabetes He presents for his follow-up diabetic visit. He has type 2 diabetes mellitus.  Frequent urination at night. Started about 1 month ago.  Dizziness comes and goes. Started a couple weeks ago.  Recheck Rash on buttock.  Results for orders placed in visit on 08/09/14  POCT URINALYSIS DIPSTICK      Result Value Ref Range   Color, UA       Clarity, UA       Glucose, UA       Bilirubin, UA       Ketones, UA       Spec Grav, UA 1.015     Blood, UA       pH, UA 5.0     Protein, UA       Urobilinogen, UA       Nitrite, UA       Leukocytes, UA      POCT GLYCOSYLATED HEMOGLOBIN (HGB A1C)      Result Value Ref Range   Hemoglobin A1C 6.0     Patient claims compliance on blood pressure medication. No obvious side effects. Watching salt intake.  Review of Systems No headache no chest pain and back pain abdominal pain no change in bowel habits    Objective:   Physical Exam Alert blood pressure excellent HEENT normal. Lungs clear heart rare rhythm. Ankles edema.  Urinalysis normal       Assessment & Plan:  Impression #1 hypertension good control. #2 type 2 diabetes good control discussed #3 increased urinary frequency chronic problem. Secondary to prostate gland. Of note patient cannot tolerate prior interventions plan maintain same blood pressure medicine. Same diabetes approach. Diet exercise discussed. Symptomatic care discussed. Follow-up as scheduled. WSL

## 2014-08-12 ENCOUNTER — Other Ambulatory Visit: Payer: Self-pay | Admitting: Family Medicine

## 2014-08-14 ENCOUNTER — Other Ambulatory Visit: Payer: Self-pay | Admitting: Family Medicine

## 2014-08-14 NOTE — Telephone Encounter (Signed)
Ok plus 5 ref 

## 2014-08-15 ENCOUNTER — Other Ambulatory Visit: Payer: Self-pay | Admitting: Family Medicine

## 2014-08-21 ENCOUNTER — Other Ambulatory Visit: Payer: Self-pay | Admitting: Family Medicine

## 2014-08-22 DIAGNOSIS — D649 Anemia, unspecified: Secondary | ICD-10-CM | POA: Diagnosis not present

## 2014-08-22 DIAGNOSIS — M109 Gout, unspecified: Secondary | ICD-10-CM

## 2014-08-22 DIAGNOSIS — E119 Type 2 diabetes mellitus without complications: Secondary | ICD-10-CM | POA: Diagnosis not present

## 2014-08-22 DIAGNOSIS — I10 Essential (primary) hypertension: Secondary | ICD-10-CM | POA: Diagnosis not present

## 2014-09-02 ENCOUNTER — Other Ambulatory Visit: Payer: Self-pay | Admitting: Family Medicine

## 2014-09-02 DIAGNOSIS — N390 Urinary tract infection, site not specified: Secondary | ICD-10-CM | POA: Diagnosis not present

## 2014-09-18 ENCOUNTER — Telehealth: Payer: Self-pay

## 2014-09-18 MED ORDER — AMLODIPINE BESYLATE 5 MG PO TABS: 5.0000 mg | ORAL_TABLET | Freq: Every day | ORAL | Status: DC

## 2014-09-18 NOTE — Telephone Encounter (Signed)
Please review blood pressure readings on patient.

## 2014-09-18 NOTE — Telephone Encounter (Signed)
Notified Jessica at [facility] with higher bp's recommend going back up to 5 mg norvasc daily, cont monitoring. Janett Billow verbalized understanding. Medication sent to pharmacy.

## 2014-09-18 NOTE — Telephone Encounter (Signed)
With higher bp's rec going back up to 5 mg norvasc daily, cont monitoring

## 2014-09-23 ENCOUNTER — Other Ambulatory Visit: Payer: Self-pay | Admitting: *Deleted

## 2014-09-23 ENCOUNTER — Telehealth: Payer: Self-pay | Admitting: *Deleted

## 2014-09-23 NOTE — Progress Notes (Signed)
Teaspoons on the cough med

## 2014-09-23 NOTE — Telephone Encounter (Signed)
Pt is already taking tylenol 500mg  BID daily. Is there something else he can have for the throat discomfort?

## 2014-09-23 NOTE — Telephone Encounter (Signed)
Can increae that samnt of tyl up to tid for the next wk

## 2014-09-23 NOTE — Telephone Encounter (Signed)
Linus Orn from [facility] notified.

## 2014-09-23 NOTE — Telephone Encounter (Signed)
Christian Thomas from [facility]. Faxes sent to [facility] and [employer].

## 2014-09-23 NOTE — Telephone Encounter (Signed)
incr omep to 40 mg qam long term,,  Tyle 650 mg q 4 to 6 prn throat disc, rob dm two twp q 6 prn cough

## 2014-09-23 NOTE — Telephone Encounter (Signed)
Christian Thomas from [facility] called to say the [name] is still having indigestion. He goes to sleep right after supper. She denies any SOB, chest pain, chest discomfort. He takes Zantac 300mg  and Prilosec 20mg  in the morning, and Mylanta in the evening.  He also has cough, congestion, and sore throat (no fever) that started over the weekend. He has been using throat spray, but it is not working.

## 2014-09-24 ENCOUNTER — Other Ambulatory Visit: Payer: Self-pay | Admitting: *Deleted

## 2014-09-24 MED ORDER — OMEPRAZOLE 20 MG PO CPDR: 20.0000 mg | DELAYED_RELEASE_CAPSULE | Freq: Two times a day (BID) | ORAL | Status: DC

## 2014-09-25 DIAGNOSIS — L84 Corns and callosities: Secondary | ICD-10-CM | POA: Diagnosis not present

## 2014-09-25 DIAGNOSIS — E1151 Type 2 diabetes mellitus with diabetic peripheral angiopathy without gangrene: Secondary | ICD-10-CM | POA: Diagnosis not present

## 2014-10-02 ENCOUNTER — Other Ambulatory Visit: Payer: Self-pay | Admitting: Family Medicine

## 2014-10-07 ENCOUNTER — Other Ambulatory Visit: Payer: Self-pay | Admitting: Family Medicine

## 2014-10-28 ENCOUNTER — Other Ambulatory Visit: Payer: Self-pay | Admitting: Family Medicine

## 2014-10-28 ENCOUNTER — Ambulatory Visit (INDEPENDENT_AMBULATORY_CARE_PROVIDER_SITE_OTHER): Payer: Medicare Other | Admitting: Family Medicine

## 2014-10-28 ENCOUNTER — Encounter: Payer: Self-pay | Admitting: Family Medicine

## 2014-10-28 VITALS — BP 124/70 | Temp 98.3°F | Ht 74.0 in | Wt 182.0 lb

## 2014-10-28 DIAGNOSIS — R21 Rash and other nonspecific skin eruption: Secondary | ICD-10-CM | POA: Diagnosis not present

## 2014-10-28 MED ORDER — PERMETHRIN 5 % EX CREA: TOPICAL_CREAM | CUTANEOUS | Status: DC

## 2014-10-28 MED ORDER — TRIAMCINOLONE ACETONIDE 0.1 % EX CREA: 1.0000 "application " | TOPICAL_CREAM | Freq: Two times a day (BID) | CUTANEOUS | Status: DC

## 2014-10-28 MED ORDER — HYDROXYZINE HCL 25 MG PO TABS: 25.0000 mg | ORAL_TABLET | Freq: Every evening | ORAL | Status: DC | PRN

## 2014-10-28 NOTE — Progress Notes (Signed)
   Subjective:    Patient ID: Christian Thomas, male    DOB: 02-Jan-1915, 80 y.o.   MRN: [id number]  Rash This is a new problem. The current episode started 1 to 4 weeks ago. Pain location: shoulder, arms, and back. The rash is characterized by itchiness. He was exposed to nothing. Treatments tried: OTC lotions.   Dizziness off and on for awhile. Mostly happens in the am when he sits on side of bed.  Itching is severe and not responding to Benadryl Extremely pruritic rash. Couple weeks duration. Worse at night. Review of Systems  Skin: Positive for rash.   No fever no chills no headache    Objective:   Physical Exam  Alert no apparent distress vital stable lungs clear. Heart regular in rhythm discrete erythematous papules with excoriation shoulders trunk and arms.      Assessment & Plan:  Impression rash associated with dry skin versus scabies due to presence nursing center plan triamcinolone cream. Elimite cream applications. Hydroxyzine daily at bedtime when necessary for itching. WSL

## 2014-11-11 ENCOUNTER — Other Ambulatory Visit: Payer: Self-pay | Admitting: Family Medicine

## 2014-11-11 ENCOUNTER — Ambulatory Visit (INDEPENDENT_AMBULATORY_CARE_PROVIDER_SITE_OTHER): Payer: Medicare Other | Admitting: Family Medicine

## 2014-11-11 ENCOUNTER — Encounter: Payer: Self-pay | Admitting: Family Medicine

## 2014-11-11 VITALS — BP 152/78 | Ht 74.0 in | Wt 181.0 lb

## 2014-11-11 DIAGNOSIS — M170 Bilateral primary osteoarthritis of knee: Secondary | ICD-10-CM | POA: Diagnosis not present

## 2014-11-11 DIAGNOSIS — G609 Hereditary and idiopathic neuropathy, unspecified: Secondary | ICD-10-CM

## 2014-11-11 DIAGNOSIS — I10 Essential (primary) hypertension: Secondary | ICD-10-CM

## 2014-11-11 DIAGNOSIS — E119 Type 2 diabetes mellitus without complications: Secondary | ICD-10-CM

## 2014-11-11 LAB — POCT GLYCOSYLATED HEMOGLOBIN (HGB A1C): Hemoglobin A1C: 6.4

## 2014-11-11 NOTE — Progress Notes (Signed)
   Subjective:    Patient ID: Christian Thomas, male    DOB: 06-04-1915, 80 y.o.   MRN: [id number]  Diabetes He presents for his follow-up diabetic visit. He has type 2 diabetes mellitus. His highest blood glucose is 140-180 mg/dl. His overall blood glucose range is 130-140 mg/dl.   Patient reports overall sleeping better at nighttime. Less itching. See prior notes.  Compliant with blood pressure medication. No obvious side effects. Has cut down salt intake.  Continues to walk regularly. Participating in solid exercise.  Patient reports chronic pain is stable with current medication. Next  Patient reports ongoing challenges with knees in need for injections today. Knee injections  Review of Systems No headache no chest pain no rash no abdominal pain no change in bowel habits no blood in stool ROS otherwise negative    Objective:   Physical Exam  Alert no apparent distress. Blood pressure good on repeat. HEENT normal. Lungs clear. Heart regular rate and rhythm. Ankles without edema.  Procedure note patient was prepped draped anesthetized with knees injected with a cc of Depo-Medrol and 2 mL Xylocaine.    Assessment & Plan:  Impression type 2 diabetes good control  Results for orders placed or performed in visit on 11/11/14  POCT glycosylated hemoglobin (Hb A1C)  Result Value Ref Range   Hemoglobin A1C 6.4    #2 hypertension good control #3 insomnia stable #4 reflux stable #5 chronic severe knee arthritis plan maintain same medications. Diet exercise discussed. Follow-up in several months. WSL

## 2014-11-27 ENCOUNTER — Other Ambulatory Visit: Payer: Self-pay | Admitting: Family Medicine

## 2014-12-02 ENCOUNTER — Other Ambulatory Visit: Payer: Self-pay | Admitting: Family Medicine

## 2014-12-04 DIAGNOSIS — X32XXXD Exposure to sunlight, subsequent encounter: Secondary | ICD-10-CM | POA: Diagnosis not present

## 2014-12-04 DIAGNOSIS — D034 Melanoma in situ of scalp and neck: Secondary | ICD-10-CM | POA: Diagnosis not present

## 2014-12-04 DIAGNOSIS — Z08 Encounter for follow-up examination after completed treatment for malignant neoplasm: Secondary | ICD-10-CM | POA: Diagnosis not present

## 2014-12-04 DIAGNOSIS — Z1283 Encounter for screening for malignant neoplasm of skin: Secondary | ICD-10-CM | POA: Diagnosis not present

## 2014-12-04 DIAGNOSIS — Z85820 Personal history of malignant melanoma of skin: Secondary | ICD-10-CM | POA: Diagnosis not present

## 2014-12-04 DIAGNOSIS — L570 Actinic keratosis: Secondary | ICD-10-CM | POA: Diagnosis not present

## 2014-12-04 DIAGNOSIS — L820 Inflamed seborrheic keratosis: Secondary | ICD-10-CM | POA: Diagnosis not present

## 2014-12-04 DIAGNOSIS — L821 Other seborrheic keratosis: Secondary | ICD-10-CM | POA: Diagnosis not present

## 2014-12-10 ENCOUNTER — Other Ambulatory Visit: Payer: Self-pay | Admitting: Family Medicine

## 2014-12-10 DIAGNOSIS — E1151 Type 2 diabetes mellitus with diabetic peripheral angiopathy without gangrene: Secondary | ICD-10-CM | POA: Diagnosis not present

## 2014-12-10 DIAGNOSIS — L84 Corns and callosities: Secondary | ICD-10-CM | POA: Diagnosis not present

## 2014-12-10 DIAGNOSIS — M170 Bilateral primary osteoarthritis of knee: Secondary | ICD-10-CM | POA: Insufficient documentation

## 2014-12-19 DIAGNOSIS — D034 Melanoma in situ of scalp and neck: Secondary | ICD-10-CM | POA: Diagnosis not present

## 2014-12-19 DIAGNOSIS — L905 Scar conditions and fibrosis of skin: Secondary | ICD-10-CM | POA: Diagnosis not present

## 2014-12-23 ENCOUNTER — Other Ambulatory Visit: Payer: Self-pay | Admitting: Family Medicine

## 2014-12-26 ENCOUNTER — Other Ambulatory Visit: Payer: Self-pay | Admitting: Family Medicine

## 2014-12-27 ENCOUNTER — Other Ambulatory Visit: Payer: Self-pay | Admitting: Family Medicine

## 2015-01-02 ENCOUNTER — Other Ambulatory Visit: Payer: Self-pay | Admitting: Family Medicine

## 2015-01-06 ENCOUNTER — Other Ambulatory Visit: Payer: Self-pay | Admitting: Family Medicine

## 2015-01-08 ENCOUNTER — Other Ambulatory Visit: Payer: Self-pay | Admitting: Family Medicine

## 2015-01-17 ENCOUNTER — Ambulatory Visit (INDEPENDENT_AMBULATORY_CARE_PROVIDER_SITE_OTHER): Payer: Medicare Other | Admitting: *Deleted

## 2015-01-17 DIAGNOSIS — H6123 Impacted cerumen, bilateral: Secondary | ICD-10-CM

## 2015-02-04 ENCOUNTER — Other Ambulatory Visit: Payer: Self-pay | Admitting: Family Medicine

## 2015-02-05 ENCOUNTER — Other Ambulatory Visit: Payer: Self-pay | Admitting: Family Medicine

## 2015-02-06 ENCOUNTER — Other Ambulatory Visit: Payer: Self-pay | Admitting: *Deleted

## 2015-02-06 MED ORDER — ALPRAZOLAM 0.25 MG PO TABS: ORAL_TABLET | ORAL | Status: DC

## 2015-02-10 ENCOUNTER — Other Ambulatory Visit: Payer: Self-pay | Admitting: Family Medicine

## 2015-02-12 ENCOUNTER — Ambulatory Visit (INDEPENDENT_AMBULATORY_CARE_PROVIDER_SITE_OTHER): Payer: Medicare Other | Admitting: Family Medicine

## 2015-02-12 ENCOUNTER — Encounter: Payer: Self-pay | Admitting: Family Medicine

## 2015-02-12 VITALS — BP 118/72 | Ht 74.0 in | Wt 187.2 lb

## 2015-02-12 DIAGNOSIS — M170 Bilateral primary osteoarthritis of knee: Secondary | ICD-10-CM | POA: Diagnosis not present

## 2015-02-12 DIAGNOSIS — Z79899 Other long term (current) drug therapy: Secondary | ICD-10-CM | POA: Diagnosis not present

## 2015-02-12 DIAGNOSIS — E785 Hyperlipidemia, unspecified: Secondary | ICD-10-CM

## 2015-02-12 DIAGNOSIS — I10 Essential (primary) hypertension: Secondary | ICD-10-CM

## 2015-02-12 DIAGNOSIS — E119 Type 2 diabetes mellitus without complications: Secondary | ICD-10-CM

## 2015-02-12 LAB — POCT GLYCOSYLATED HEMOGLOBIN (HGB A1C): Hemoglobin A1C: 6.6

## 2015-02-12 MED ORDER — METHYLPREDNISOLONE ACETATE 40 MG/ML IJ SUSP: 40.0000 mg | Freq: Once | INTRAMUSCULAR | Status: DC

## 2015-02-12 NOTE — Addendum Note (Signed)
Addended by: Margaretmary Eddy on: 02/12/2015 01:23 PM   Modules accepted: Orders

## 2015-02-12 NOTE — Progress Notes (Signed)
   Subjective:    Patient ID: Christian Thomas, male    DOB: 1915/11/03, 81 y.o.   MRN: [id number]  Diabetes He presents for his follow-up diabetic visit. He has type 2 diabetes mellitus. Risk factors for coronary artery disease include diabetes mellitus, hypertension, dyslipidemia and sedentary lifestyle. Current diabetic treatment includes oral agent (monotherapy). He is compliant with treatment all of the time. He rarely participates in exercise.   Results for orders placed or performed in visit on 02/12/15  POCT glycosylated hemoglobin (Hb A1C)  Result Value Ref Range   Hemoglobin A1C 6.6    Patient compliant with blood pressure medicine. No obvious side effects. Watching salt intake.  Blood pressures checked at the center overall had been in good shape.  Continues to experience significant itchy rashes. Triamcinolone generally helps is.  Continues to do a challenges of tremor. Diagnosed with essential tremor in the past.  Also compromised by [facility] blindness, hearing aid use, and dense permanent neuropathy.  I'll ongoing bilateral knee pain in time for injection   Review of Systems No headache no chest pain no back pain no abdominal pain no change in bowel habits    Objective:   Physical Exam  Alert blood pressure good on repeat HEENT normal vital stable. Lungs clear. Heart regular in rhythm. Ankles without edema knees substantial crepitations  Patient was prepped draped anesthetized both knees were injected with 1 mL Depo-Medrol 2 mL of Xylocaine      Assessment & Plan:  Impression 1 hypertension good control #2 type 2 diabetes good control #3 essential tremor discuss #4 severe neuropathy discussed #5 bilateral knee arthritis injection given plan follow-up in 3 months. Maintain same medications. Appropriate blood work. WSL

## 2015-02-13 ENCOUNTER — Encounter: Payer: Self-pay | Admitting: Family Medicine

## 2015-02-24 DIAGNOSIS — Z85820 Personal history of malignant melanoma of skin: Secondary | ICD-10-CM | POA: Diagnosis not present

## 2015-02-24 DIAGNOSIS — Z08 Encounter for follow-up examination after completed treatment for malignant neoplasm: Secondary | ICD-10-CM | POA: Diagnosis not present

## 2015-02-24 DIAGNOSIS — Z1283 Encounter for screening for malignant neoplasm of skin: Secondary | ICD-10-CM | POA: Diagnosis not present

## 2015-02-24 DIAGNOSIS — X32XXXA Exposure to sunlight, initial encounter: Secondary | ICD-10-CM | POA: Diagnosis not present

## 2015-02-24 DIAGNOSIS — L820 Inflamed seborrheic keratosis: Secondary | ICD-10-CM | POA: Diagnosis not present

## 2015-02-24 DIAGNOSIS — L570 Actinic keratosis: Secondary | ICD-10-CM | POA: Diagnosis not present

## 2015-02-26 DIAGNOSIS — E1151 Type 2 diabetes mellitus with diabetic peripheral angiopathy without gangrene: Secondary | ICD-10-CM | POA: Diagnosis not present

## 2015-02-26 DIAGNOSIS — L84 Corns and callosities: Secondary | ICD-10-CM | POA: Diagnosis not present

## 2015-03-02 IMAGING — CT CT HEAD W/O CM
1 series · 16 of 30 positions shown, 20 images · non-contrast
Comparison: 12/28/2007

CLINICAL DATA: Fatigue and dizziness with transient word-finding
difficulties. Weakness.

EXAM:
CT HEAD WITHOUT CONTRAST
TECHNIQUE: Contiguous axial images were obtained from the base of the skull
through the vertex without intravenous contrast.

[Series 2: headtrauma 4.8 h37s · axial · 0.45mm/px · z∈[+55,+205]mm · 16 of 36 slices shown, 20 images]
[im 2/36  brain]
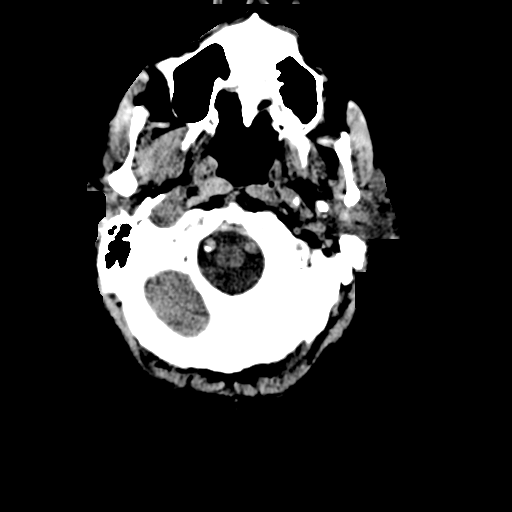
[im 2/36  bone]
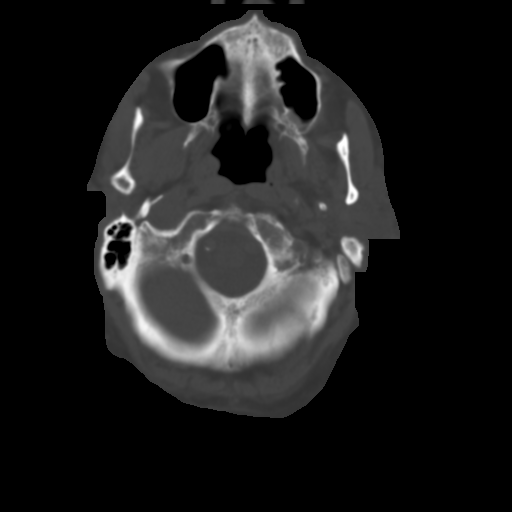
[im 4/36  brain]
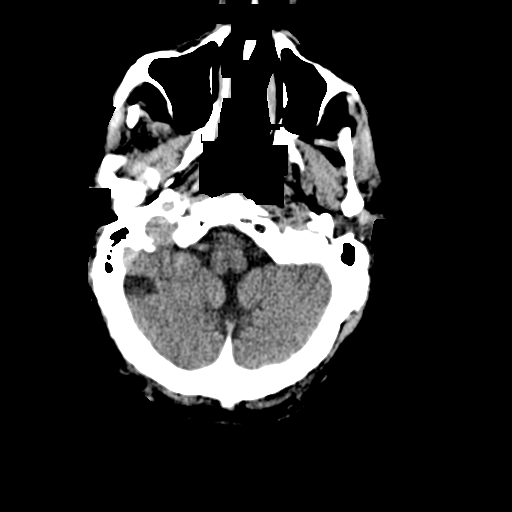
[im 7/36  brain]
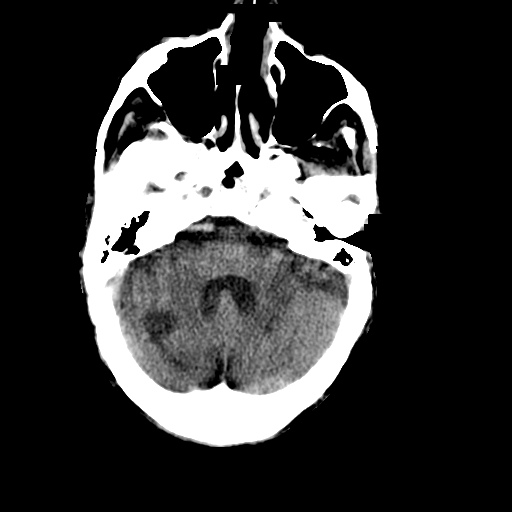
[im 8/36  brain]
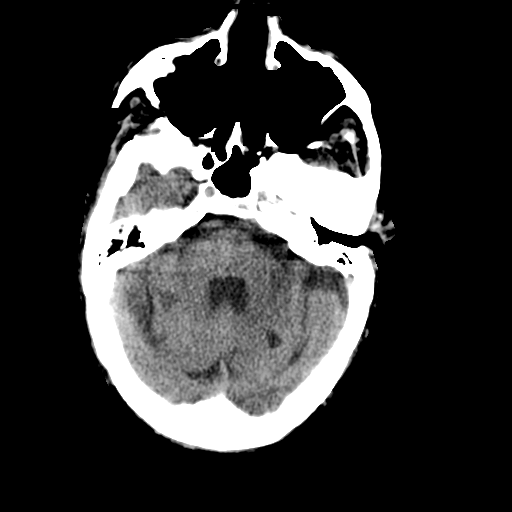
[im 10/36  brain]
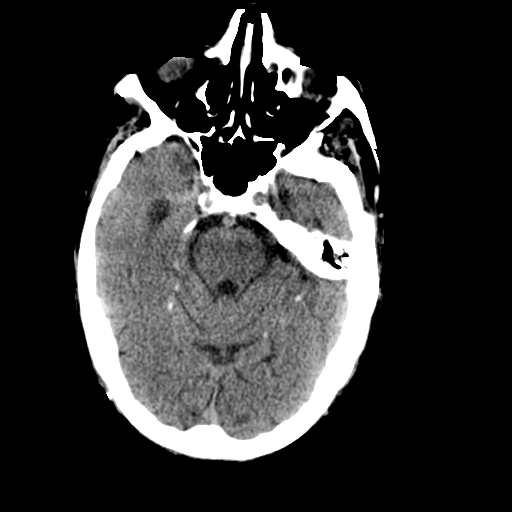
[im 10/36  bone]
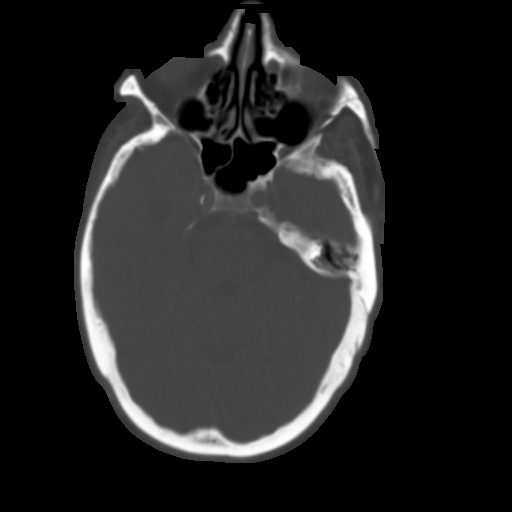
[im 13/36  brain]
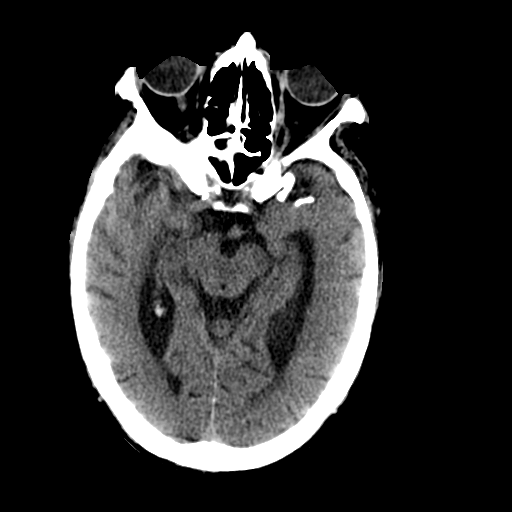
[im 14/36  brain]
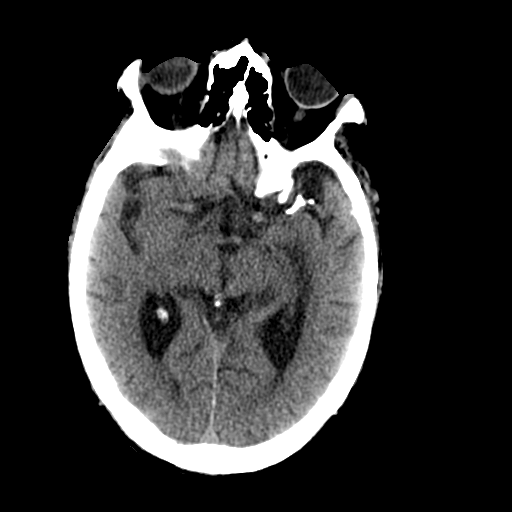
[im 16/36  brain]
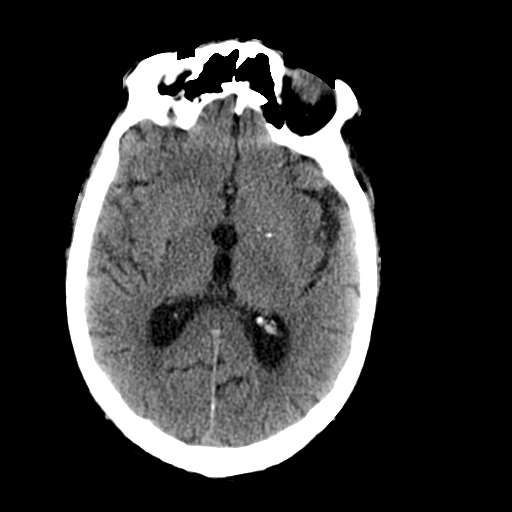
[im 19/36  brain]
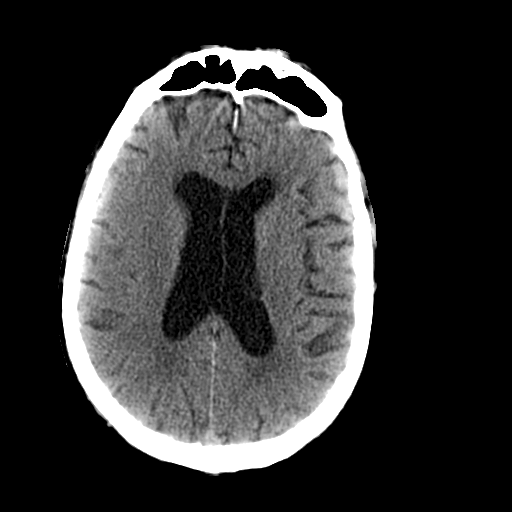
[im 19/36  bone]
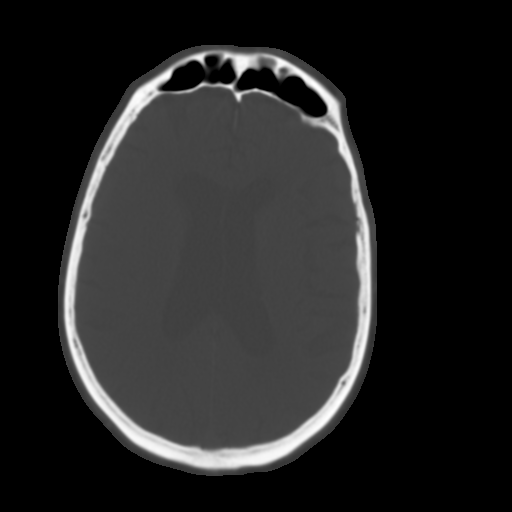
[im 21/36  brain]
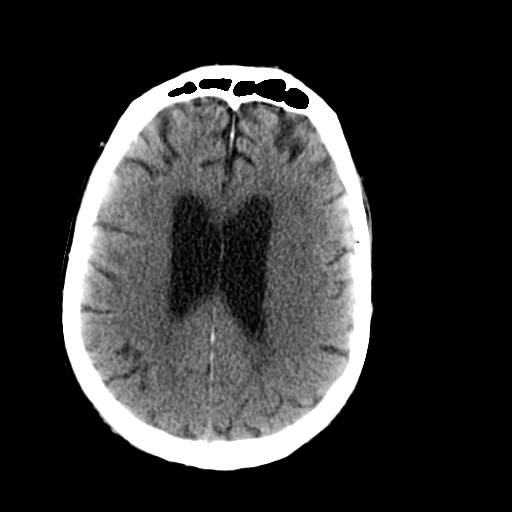
[im 22/36  brain]
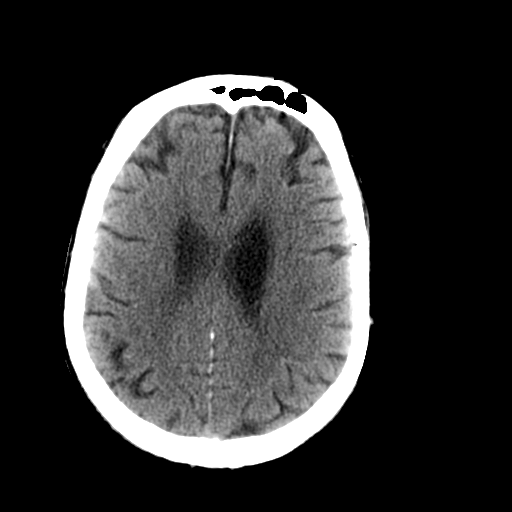
[im 25/36  brain]
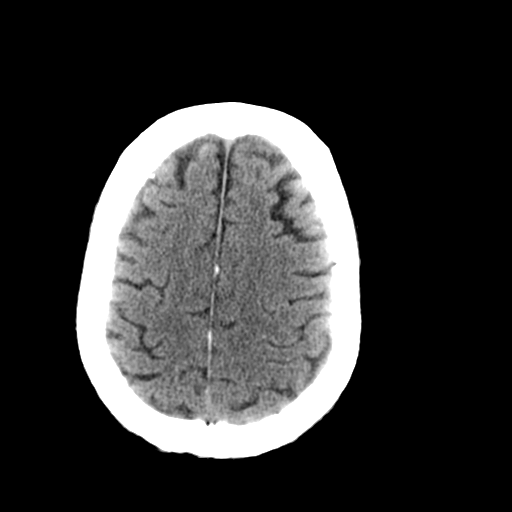
[im 27/36  brain]
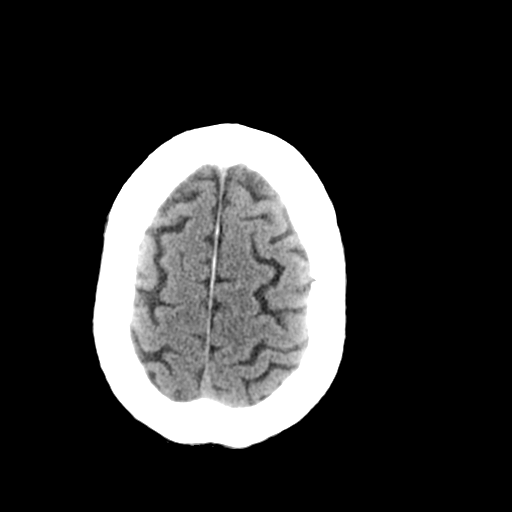
[im 27/36  bone]
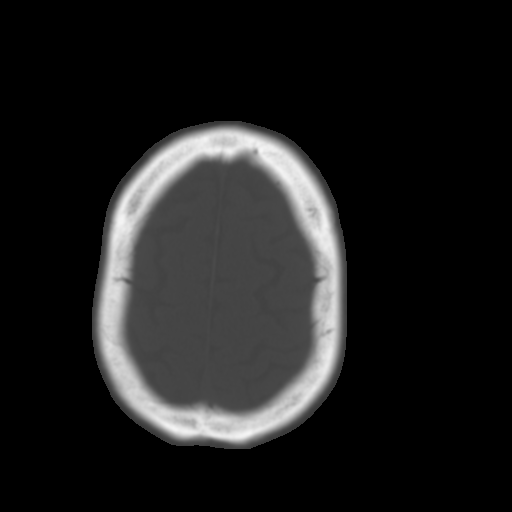
[im 28/36  brain]
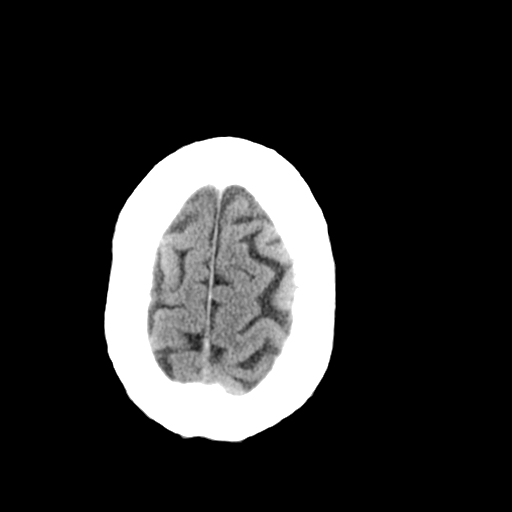
[im 31/36  brain]
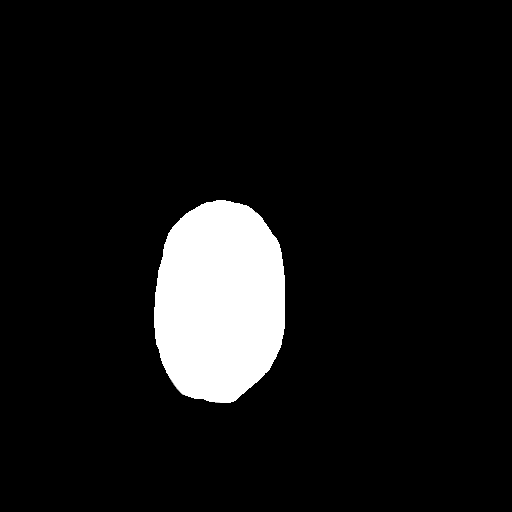
[im 33/36  brain]
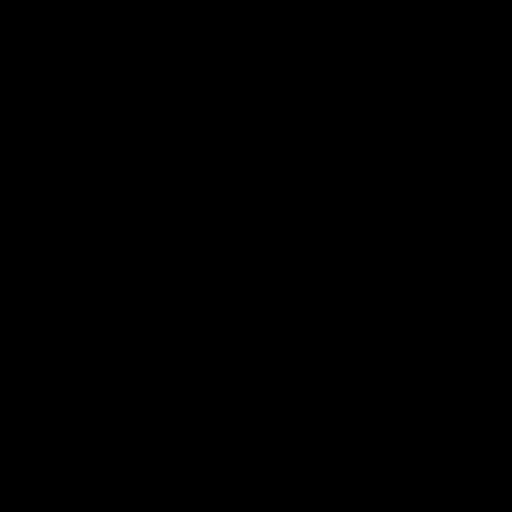

[16 of 30 positions shown; findings below may reference images not displayed]

FINDINGS: Ventricles, cisterns and other CSF spaces are within normal. There
is a small old lacunar infarct over the right basal ganglia. There
is evidence of old bilateral small cerebellar infarcts. There is no
mass, mass effect, shift of midline structures or acute hemorrhage.
There is no evidence to suggest acute infarction. Remainder of the
exam is unremarkable.
IMPRESSION: No acute intracranial findings.

Old small bilateral cerebellar infarcts. Small old right basal
ganglia lacunar infarct.

## 2015-03-05 DIAGNOSIS — Z79899 Other long term (current) drug therapy: Secondary | ICD-10-CM | POA: Diagnosis not present

## 2015-03-05 DIAGNOSIS — E119 Type 2 diabetes mellitus without complications: Secondary | ICD-10-CM | POA: Diagnosis not present

## 2015-03-05 DIAGNOSIS — E785 Hyperlipidemia, unspecified: Secondary | ICD-10-CM | POA: Diagnosis not present

## 2015-03-06 LAB — HEPATIC FUNCTION PANEL
ALBUMIN: 4.4 g/dL (ref 3.2–4.6)
ALT: 15 IU/L (ref 0–44)
AST: 15 IU/L (ref 0–40)
Alkaline Phosphatase: 70 IU/L (ref 39–117)
BILIRUBIN TOTAL: 0.4 mg/dL (ref 0.0–1.2)
Bilirubin, Direct: 0.1 mg/dL (ref 0.00–0.40)
TOTAL PROTEIN: 6.4 g/dL (ref 6.0–8.5)

## 2015-03-06 LAB — BASIC METABOLIC PANEL
BUN / CREAT RATIO: 20 (ref 10–22)
BUN: 28 mg/dL (ref 10–36)
CALCIUM: 9.5 mg/dL (ref 8.6–10.2)
CO2: 22 mmol/L (ref 18–29)
CREATININE: 1.37 mg/dL — AB (ref 0.76–1.27)
Chloride: 100 mmol/L (ref 97–108)
GFR calc Af Amer: 50 mL/min/{1.73_m2} — ABNORMAL LOW (ref >59)
GFR calc non Af Amer: 43 mL/min/{1.73_m2} — ABNORMAL LOW (ref >59)
Glucose: 214 mg/dL — ABNORMAL HIGH (ref 65–99)
Potassium: 4.8 mmol/L (ref 3.5–5.2)
Sodium: 139 mmol/L (ref 134–144)

## 2015-03-06 LAB — LIPID PANEL
Chol/HDL Ratio: 2.5 ratio units (ref 0.0–5.0)
Cholesterol, Total: 156 mg/dL (ref 100–199)
HDL: 62 mg/dL (ref >39)
LDL Calculated: 59 mg/dL (ref 0–99)
Triglycerides: 174 mg/dL — ABNORMAL HIGH (ref 0–149)
VLDL Cholesterol Cal: 35 mg/dL (ref 5–40)

## 2015-03-06 LAB — MICROALBUMIN, URINE: Microalbumin, Urine: 30.6 ug/mL — ABNORMAL HIGH (ref 0.0–17.0)

## 2015-03-10 ENCOUNTER — Encounter: Payer: Self-pay | Admitting: Family Medicine

## 2015-03-18 ENCOUNTER — Telehealth: Payer: Self-pay | Admitting: Family Medicine

## 2015-03-18 MED ORDER — TRAMADOL HCL 50 MG PO TABS: ORAL_TABLET | ORAL | Status: DC

## 2015-03-18 NOTE — Telephone Encounter (Signed)
Christian Thomas was notified that script is ready for pickup.

## 2015-03-18 NOTE — Telephone Encounter (Signed)
[facility] calling to say the patient is out of his tramadol Can we write a hard script for them to pick up please   He has been out since yesterday (missed 3 doses)   Call [phone]  Levada Dy

## 2015-03-18 NOTE — Telephone Encounter (Signed)
wis 6 no ref

## 2015-04-03 LAB — HM DIABETES EYE EXAM

## 2015-04-04 DIAGNOSIS — H3122 Choroidal dystrophy (central areolar) (generalized) (peripapillary): Secondary | ICD-10-CM | POA: Diagnosis not present

## 2015-05-12 ENCOUNTER — Other Ambulatory Visit: Payer: Self-pay

## 2015-05-12 ENCOUNTER — Telehealth: Payer: Self-pay | Admitting: Family Medicine

## 2015-05-12 NOTE — Telephone Encounter (Signed)
Patient also wants advise on increasing patient's xanax-current dose not helping much anymore but increase dose may increase unsteadiness. What do you advise?

## 2015-05-12 NOTE — Telephone Encounter (Signed)
What is exact current dose for the xanax that they are giving each day? Also, trazadone is sedating and should not be given along with the xanax at night, plz be sure family aware of that as we try to make choices

## 2015-05-12 NOTE — Telephone Encounter (Signed)
Dr Richardson Landry has this info from facility on folder on desk.

## 2015-05-12 NOTE — Telephone Encounter (Signed)
Inform the family that Dr. Richardson Landry will see this on Tuesday and initiate handling. Please forward this to Dr. Richardson Landry

## 2015-05-12 NOTE — Telephone Encounter (Signed)
Pt is needing his xanax dosage increased and the insurance won't pay for the itching meds that the pt was prescribed. Pt's niece/poa stated the facility the pt is in faxed over a paper stating that the ins co wont pay for the meds for something else to be prescribed that the ins co will pay for. Pt is not able to sleep due to the itching.

## 2015-05-12 NOTE — Telephone Encounter (Signed)
They gave alternatives that really aren't alternatives, generic hydroxyz should be relatively cheap but if they dont want to do that, we can try trazadone 50 qhs one hr before sleep, 30 on hs prn five ref

## 2015-05-13 MED ORDER — TRAZODONE HCL 50 MG PO TABS: 50.0000 mg | ORAL_TABLET | Freq: Every evening | ORAL | Status: DC | PRN

## 2015-05-13 NOTE — Telephone Encounter (Signed)
Notified [name] (neice) and Photographer (nurse at [facility]) that per Dr. Richardson Landry, discontinue the Xanax and start Trazodone 50 mg 1 tablet qhs prn for sleep. Continue the hydroxyzine for itching as needed. [name] and Levada Dy verbalized understanding and med was sent to pharmacy, order faxed to [facility].

## 2015-05-14 DIAGNOSIS — L84 Corns and callosities: Secondary | ICD-10-CM | POA: Diagnosis not present

## 2015-05-14 DIAGNOSIS — E1151 Type 2 diabetes mellitus with diabetic peripheral angiopathy without gangrene: Secondary | ICD-10-CM | POA: Diagnosis not present

## 2015-05-23 ENCOUNTER — Ambulatory Visit: Payer: Medicare Other | Admitting: Family Medicine

## 2015-05-29 ENCOUNTER — Encounter: Payer: Self-pay | Admitting: Family Medicine

## 2015-05-29 ENCOUNTER — Ambulatory Visit (INDEPENDENT_AMBULATORY_CARE_PROVIDER_SITE_OTHER): Payer: Medicare Other | Admitting: Family Medicine

## 2015-05-29 VITALS — BP 132/74 | Ht 74.0 in | Wt 189.1 lb

## 2015-05-29 DIAGNOSIS — R21 Rash and other nonspecific skin eruption: Secondary | ICD-10-CM

## 2015-05-29 DIAGNOSIS — M129 Arthropathy, unspecified: Secondary | ICD-10-CM | POA: Diagnosis not present

## 2015-05-29 DIAGNOSIS — M170 Bilateral primary osteoarthritis of knee: Secondary | ICD-10-CM

## 2015-05-29 DIAGNOSIS — I10 Essential (primary) hypertension: Secondary | ICD-10-CM | POA: Diagnosis not present

## 2015-05-29 DIAGNOSIS — E119 Type 2 diabetes mellitus without complications: Secondary | ICD-10-CM | POA: Diagnosis not present

## 2015-05-29 DIAGNOSIS — E785 Hyperlipidemia, unspecified: Secondary | ICD-10-CM

## 2015-05-29 LAB — POCT GLYCOSYLATED HEMOGLOBIN (HGB A1C): Hemoglobin A1C: 6.2

## 2015-05-29 MED ORDER — METHYLPREDNISOLONE ACETATE 40 MG/ML IJ SUSP: 40.0000 mg | Freq: Once | INTRAMUSCULAR | Status: DC

## 2015-05-29 MED ORDER — HYDROCORTISONE 2.5 % EX LOTN: TOPICAL_LOTION | Freq: Two times a day (BID) | CUTANEOUS | Status: DC

## 2015-05-29 MED ORDER — KETOCONAZOLE 2 % EX CREA: 1.0000 "application " | TOPICAL_CREAM | Freq: Two times a day (BID) | CUTANEOUS | Status: DC

## 2015-05-29 NOTE — Progress Notes (Signed)
   Subjective:    Patient ID: Christian Thomas, male    DOB: [birth year], 81 y.o.   MRN: [id number]  Diabetes He presents for his follow-up diabetic visit. He has type 2 diabetes mellitus. There are no hypoglycemic associated symptoms. There are no diabetic associated symptoms. There are no hypoglycemic complications. There are no diabetic complications. There are no known risk factors for coronary artery disease. Current diabetic treatment includes oral agent (monotherapy). He is compliant with treatment all of the time.   Last diabetic eye exam- 04/03/2015. Results for orders placed or performed in visit on 05/29/15  POCT glycosylated hemoglobin (Hb A1C)  Result Value Ref Range   Hemoglobin A1C 6.2      Patient states that he wants his knees injected today. Generally injections help a lot. Having more more difficulty getting around. Next  Compliant blood pressure medicine. No obvious side effects. Blood pressures good. Next   Patient states that he is itching and sleeping all the time. On further history he goes to sleep at 6 PM and wakes up at midnight and then finds himself itchy. Also naps during the day. Has developed a rash on his side of itching.   Patient states that he is having soreness on his buttocks that he wants the doctor to look at. Developed an irritated inflamed area that sometimes gets raw.  Review of Systems No headache no chest pain no back pain fair appetite no change in bowel habits    Objective:   Physical Exam  Alert vitals stable HET normal neck supple. Lungs clear. Heart rare rhythm are maculopapular rash similar on neck presacral reason hypertrophic hyperpigmented rash with excoriation ankles trace edema sensation lost and feet  Patient was prepped draped anesthetized both knees injected with 1 mL Depo-Medrol 2 mL Xylocaine.      Assessment & Plan:  Impression 1 type 2 diabetes good control discussed him 2 hypertension good control discussed #3 rash  discussed #4 insomnia ongoing aggravation discussed plan hydrocortisone lotion twice a day. Ketoconazole cream 2 presacral rash. Maintain other meds. Diet exercise discussed WSL

## 2015-06-27 IMAGING — CT CT ABD-PELV W/ CM
2 of 5 series · 15 of 46 positions shown, 17 images · IV contrast (omnipaque)
Comparison: 10/11/2007

CLINICAL DATA: Rectal bleeding, dark blood per rectum for 3-4 days,
history of C difficile colitis, diverticulosis, hypertension,
diabetes, CHF, stroke

EXAM:
CT ABDOMEN AND PELVIS WITH CONTRAST
TECHNIQUE: Multidetector CT imaging of the abdomen and pelvis was performed
using the standard protocol following bolus administration of
intravenous contrast. Sagittal and coronal MPR images reconstructed
from axial data set.
CONTRAST:  50mL OMNIPAQUE IOHEXOL 300 MG/ML SOLN, 100mL OMNIPAQUE
IOHEXOL 300 MG/ML SOLN

[Series 2: abd_pel_with 5.0 b40f · axial · 0.73mm/px · z∈[-448,-8]mm · 12 of 100 slices shown, 14 images]
[im 6/100  soft-tissue]
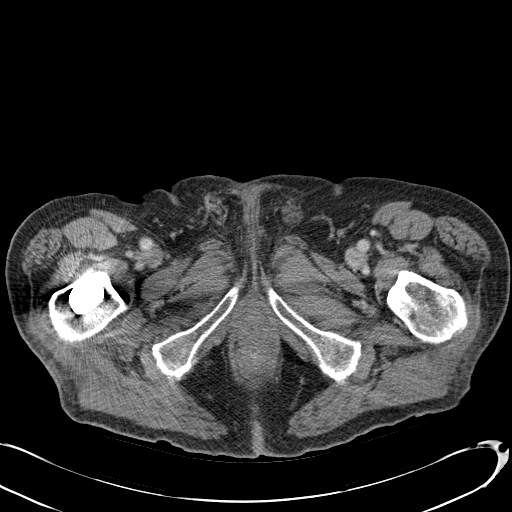
[im 6/100  bone]
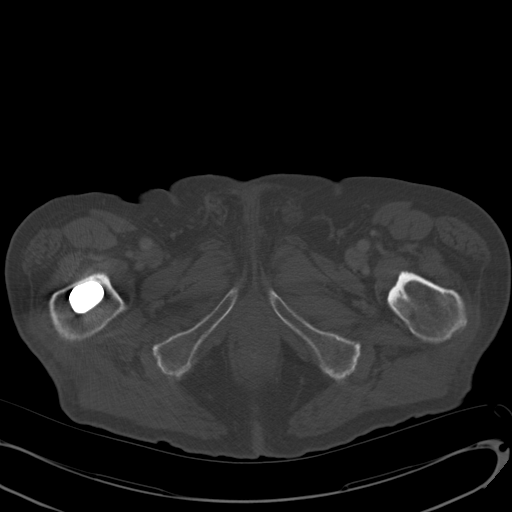
[im 16/100  soft-tissue]
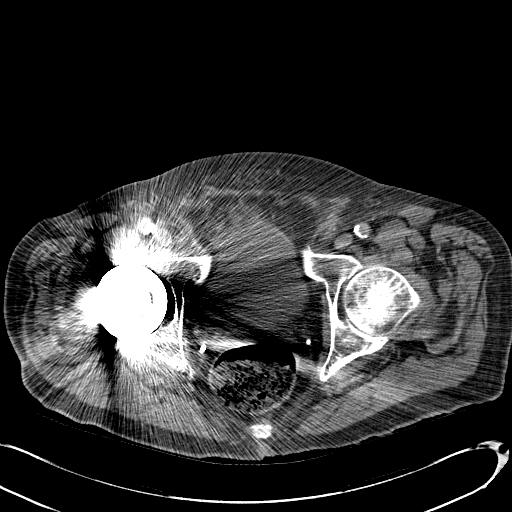
[im 21/100  soft-tissue]
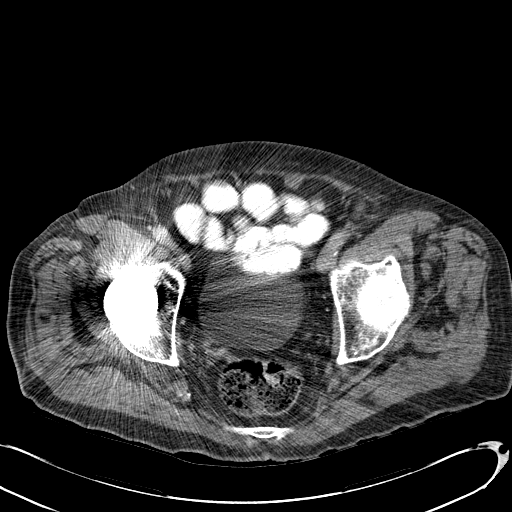
[im 32/100  soft-tissue]
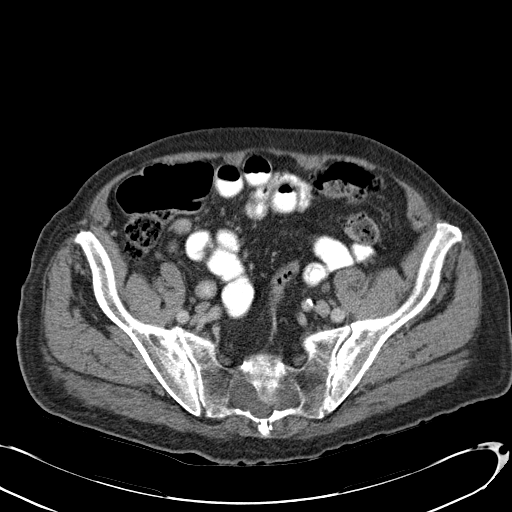
[im 37/100  soft-tissue]
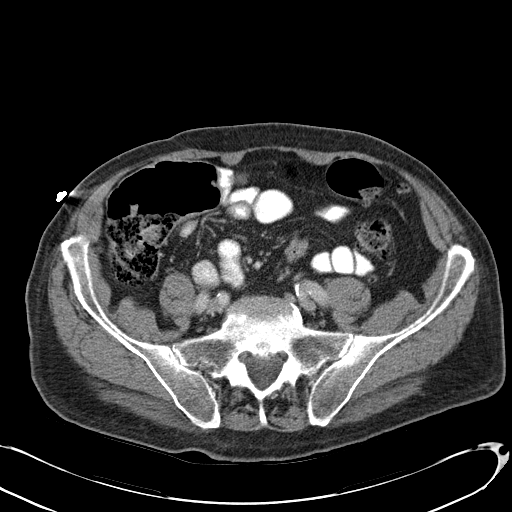
[im 47/100  soft-tissue]
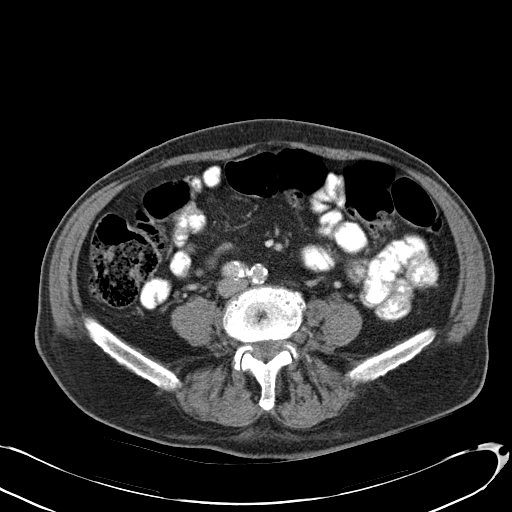
[im 53/100  soft-tissue]
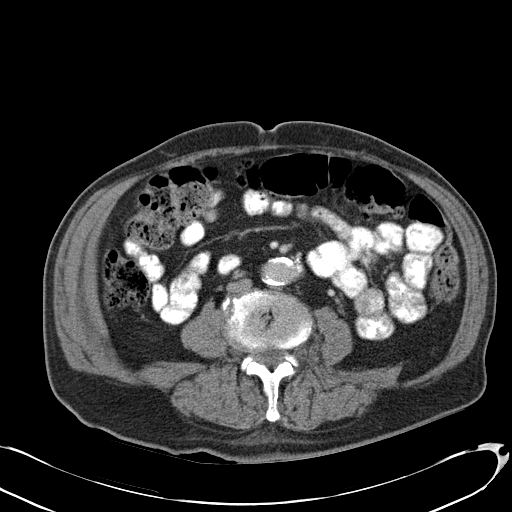
[im 63/100  soft-tissue]
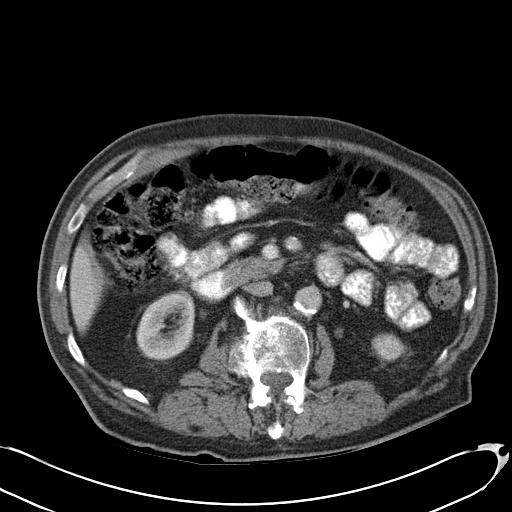
[im 68/100  soft-tissue]
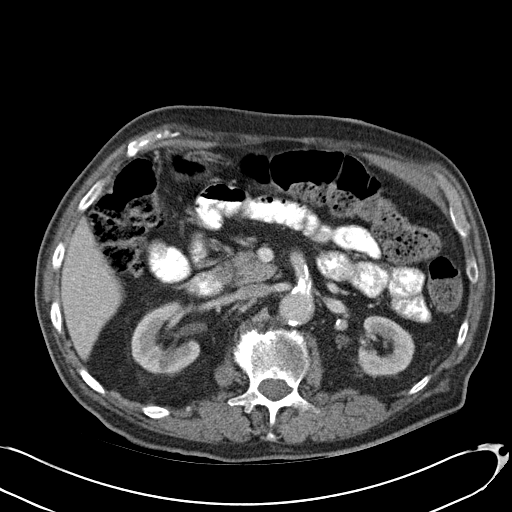
[im 68/100  bone]
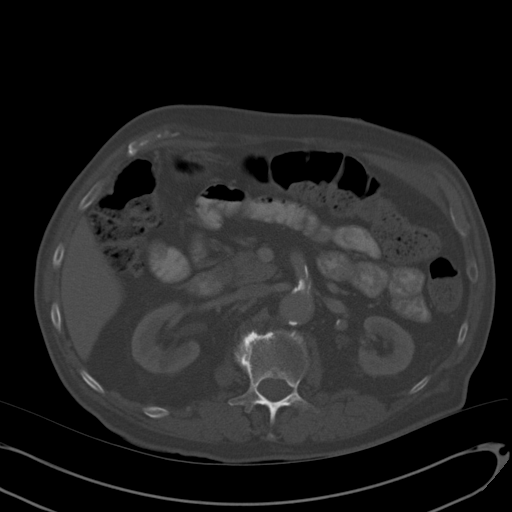
[im 79/100  soft-tissue]
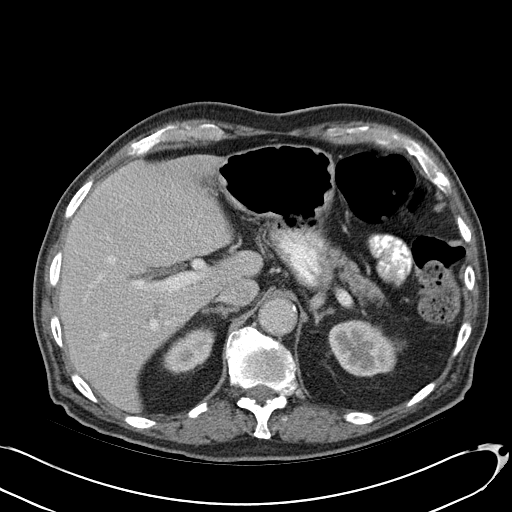
[im 84/100  soft-tissue]
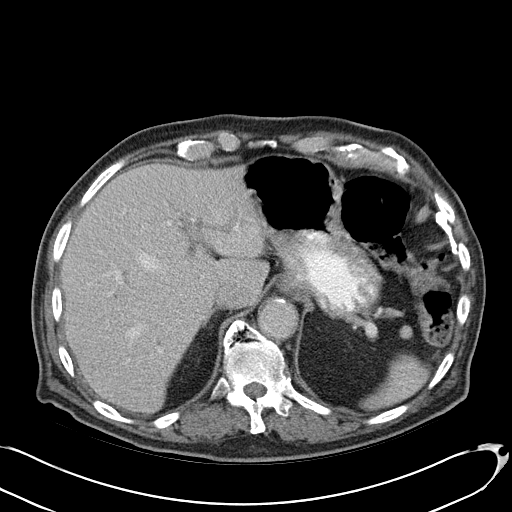
[im 94/100  soft-tissue]
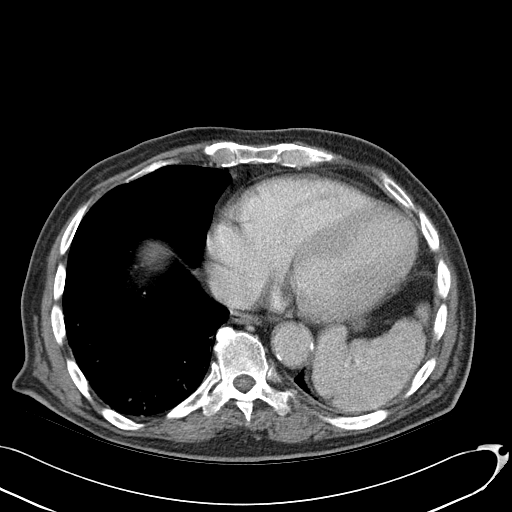

[Series 4: abd_pel_with 3.0 spo · coronal · 0.66mm/px · 3 of 83 slices shown]
[im 28/83  soft-tissue]
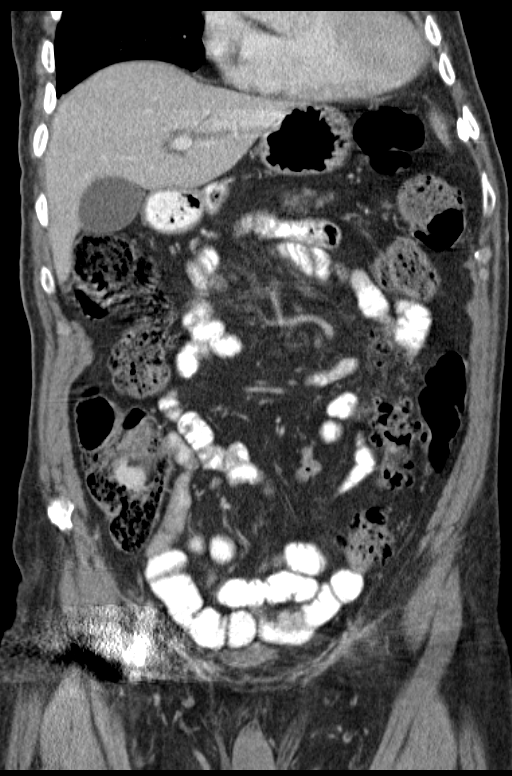
[im 37/83  soft-tissue]
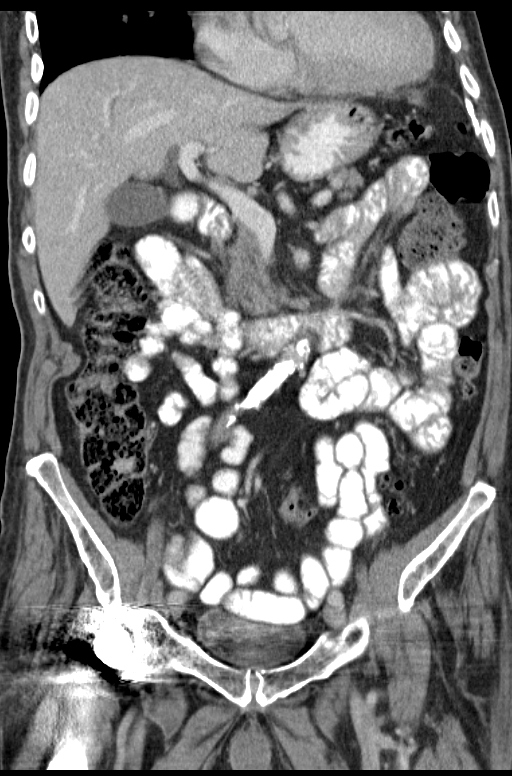
[im 46/83  soft-tissue]
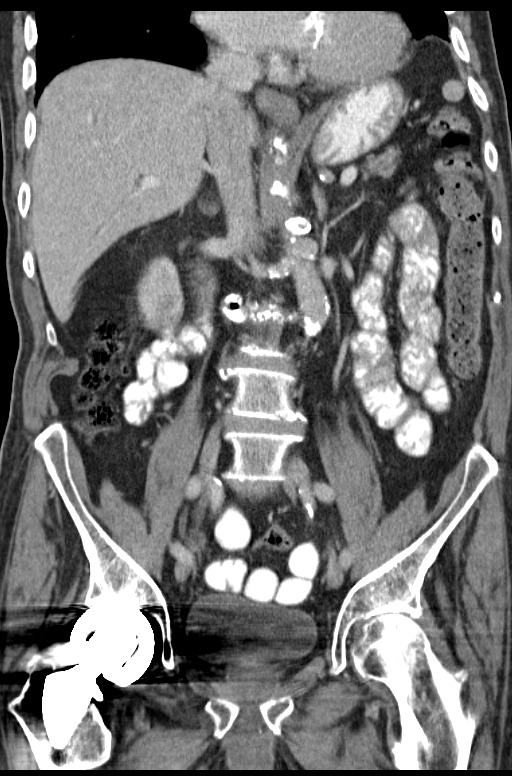

[15 of 46 positions shown; findings below may reference images not displayed]

FINDINGS: Minimal peripheral fibrosis at right lung base.

Atherosclerotic calcifications aorta, iliac arteries and branch
vessels.

Few tiny pancreatic calcifications question representing chronic
calcific pancreatitis.

Liver, spleen, pancreas, kidneys, and adrenal glands otherwise
normal appearance.

Small splenules adjacent to spleen.

Beam hardening artifacts from right hip prosthesis obscure portions
of pelvis.

Minimal prostatic enlargement with unremarkable bladder and
visualized ureters.

Stomach and small bowel loops grossly unremarkable.

Diverticulosis of distal descending and sigmoid colon without
evidence of acute diverticulitis.

Appendix surgically absent by history.

Scattered motion artifacts.

No mass, adenopathy, free fluid, or inflammatory process.

Bones diffusely demineralized.
IMPRESSION: Few tiny pancreatic calcifications, nonspecific but could represent
chronic calcific pancreatitis.

Distal colonic diverticulosis without evidence of diverticulitis.

No definite acute intra-abdominal or intrapelvic process otherwise
identified.

## 2015-08-04 ENCOUNTER — Telehealth: Payer: Self-pay | Admitting: Family Medicine

## 2015-08-04 MED ORDER — ALPRAZOLAM 0.25 MG PO TABS: ORAL_TABLET | ORAL | Status: DC

## 2015-08-04 NOTE — Telephone Encounter (Signed)
Called and spoke with Patient's niece and informed her per Dr.Steve Luking that Xanax 0.25mg  QHS PRN anxiety and itching was ordered. Patient's niece verbalized understanding.

## 2015-08-04 NOTE — Telephone Encounter (Signed)
Patient was previously on Xanax 0.25mg  BID as needed for anxiety.

## 2015-08-04 NOTE — Telephone Encounter (Signed)
Change to .25 qhs prn anxiety, itching five ref numb 30

## 2015-08-04 NOTE — Telephone Encounter (Signed)
Pt is itching all over, there is no rash present. Niece states that the cream that was given to him works but because [facility] is afraid that it will run out early they put it on thin. Niece states that she is wondering if the itching is coming from nerves. Niece states that when he was on the xanax he didn't itch. Niece wants to know if it is possible for him to go back on the xanax or something like it for his nerves to see if that helps.

## 2015-08-04 NOTE — Telephone Encounter (Signed)
o k resume xanax, im not thrilled about pt taking it but lets do, look up prior dose and let me know before calling in

## 2015-08-06 DIAGNOSIS — E1151 Type 2 diabetes mellitus with diabetic peripheral angiopathy without gangrene: Secondary | ICD-10-CM | POA: Diagnosis not present

## 2015-08-06 DIAGNOSIS — L84 Corns and callosities: Secondary | ICD-10-CM | POA: Diagnosis not present

## 2015-08-12 DIAGNOSIS — Z23 Encounter for immunization: Secondary | ICD-10-CM | POA: Diagnosis not present

## 2015-08-27 DIAGNOSIS — G250 Essential tremor: Secondary | ICD-10-CM | POA: Diagnosis not present

## 2015-08-27 DIAGNOSIS — G8929 Other chronic pain: Secondary | ICD-10-CM | POA: Diagnosis not present

## 2015-08-27 DIAGNOSIS — Z7984 Long term (current) use of oral hypoglycemic drugs: Secondary | ICD-10-CM | POA: Diagnosis not present

## 2015-08-27 DIAGNOSIS — E1140 Type 2 diabetes mellitus with diabetic neuropathy, unspecified: Secondary | ICD-10-CM | POA: Diagnosis not present

## 2015-08-27 DIAGNOSIS — M6281 Muscle weakness (generalized): Secondary | ICD-10-CM | POA: Diagnosis not present

## 2015-08-27 DIAGNOSIS — Z9181 History of falling: Secondary | ICD-10-CM | POA: Diagnosis not present

## 2015-08-27 DIAGNOSIS — I509 Heart failure, unspecified: Secondary | ICD-10-CM | POA: Diagnosis not present

## 2015-08-27 DIAGNOSIS — M109 Gout, unspecified: Secondary | ICD-10-CM | POA: Diagnosis not present

## 2015-08-27 DIAGNOSIS — M170 Bilateral primary osteoarthritis of knee: Secondary | ICD-10-CM | POA: Diagnosis not present

## 2015-08-27 DIAGNOSIS — I10 Essential (primary) hypertension: Secondary | ICD-10-CM | POA: Diagnosis not present

## 2015-08-27 DIAGNOSIS — H3530 Unspecified macular degeneration: Secondary | ICD-10-CM | POA: Diagnosis not present

## 2015-08-29 DIAGNOSIS — I509 Heart failure, unspecified: Secondary | ICD-10-CM | POA: Diagnosis not present

## 2015-08-29 DIAGNOSIS — M170 Bilateral primary osteoarthritis of knee: Secondary | ICD-10-CM | POA: Diagnosis not present

## 2015-08-29 DIAGNOSIS — G8929 Other chronic pain: Secondary | ICD-10-CM | POA: Diagnosis not present

## 2015-08-29 DIAGNOSIS — E1140 Type 2 diabetes mellitus with diabetic neuropathy, unspecified: Secondary | ICD-10-CM | POA: Diagnosis not present

## 2015-08-29 DIAGNOSIS — M6281 Muscle weakness (generalized): Secondary | ICD-10-CM | POA: Diagnosis not present

## 2015-08-29 DIAGNOSIS — G250 Essential tremor: Secondary | ICD-10-CM | POA: Diagnosis not present

## 2015-09-01 ENCOUNTER — Encounter: Payer: Self-pay | Admitting: Family Medicine

## 2015-09-01 ENCOUNTER — Ambulatory Visit (INDEPENDENT_AMBULATORY_CARE_PROVIDER_SITE_OTHER): Payer: Medicare Other | Admitting: Family Medicine

## 2015-09-01 VITALS — BP 138/80 | Temp 98.4°F | Ht 74.0 in | Wt 196.0 lb

## 2015-09-01 DIAGNOSIS — R21 Rash and other nonspecific skin eruption: Secondary | ICD-10-CM | POA: Diagnosis not present

## 2015-09-01 DIAGNOSIS — E119 Type 2 diabetes mellitus without complications: Secondary | ICD-10-CM | POA: Diagnosis not present

## 2015-09-01 DIAGNOSIS — H6123 Impacted cerumen, bilateral: Secondary | ICD-10-CM

## 2015-09-01 DIAGNOSIS — G250 Essential tremor: Secondary | ICD-10-CM | POA: Diagnosis not present

## 2015-09-01 DIAGNOSIS — G8929 Other chronic pain: Secondary | ICD-10-CM | POA: Diagnosis not present

## 2015-09-01 DIAGNOSIS — I10 Essential (primary) hypertension: Secondary | ICD-10-CM

## 2015-09-01 DIAGNOSIS — M170 Bilateral primary osteoarthritis of knee: Secondary | ICD-10-CM | POA: Diagnosis not present

## 2015-09-01 DIAGNOSIS — I509 Heart failure, unspecified: Secondary | ICD-10-CM | POA: Diagnosis not present

## 2015-09-01 DIAGNOSIS — M6281 Muscle weakness (generalized): Secondary | ICD-10-CM | POA: Diagnosis not present

## 2015-09-01 DIAGNOSIS — E1140 Type 2 diabetes mellitus with diabetic neuropathy, unspecified: Secondary | ICD-10-CM | POA: Diagnosis not present

## 2015-09-01 LAB — POCT GLYCOSYLATED HEMOGLOBIN (HGB A1C): Hemoglobin A1C: 7

## 2015-09-01 MED ORDER — HYDROCORTISONE 2.5 % EX LOTN: TOPICAL_LOTION | Freq: Two times a day (BID) | CUTANEOUS | Status: DC

## 2015-09-01 MED ORDER — ALBUTEROL SULFATE HFA 108 (90 BASE) MCG/ACT IN AERS: 2.0000 | INHALATION_SPRAY | Freq: Four times a day (QID) | RESPIRATORY_TRACT | Status: DC | PRN

## 2015-09-01 MED ORDER — KETOCONAZOLE 2 % EX CREA: 1.0000 "application " | TOPICAL_CREAM | Freq: Two times a day (BID) | CUTANEOUS | Status: DC

## 2015-09-01 MED ORDER — AEROCHAMBER MINI CHAMBER DEVI: Status: AC

## 2015-09-01 NOTE — Progress Notes (Signed)
   Subjective:    Patient ID: Christian Thomas, male    DOB: 09/10/1915, 81 y.o.   MRN: [id number] Patient arrives office with a tremendous number of concerns.  Has diffuse pruritus. Worse at night. Has not responded very well to agents. Unfortunately accidentally given capsaicin cream and is region of chronic pruritus. Which obviously made things worse.  Does use capsaicin cream for chronic neuropathic pain in the feet and gains benefit from this.  Also notes rash in. Sacral area. Irritating.  I'm in trouble hearing wonders if he has wax in his ears.  Compliant with blood pressure medicine. No obvious side effects. Trying to cut down salt intake.   Diabetes He presents for his follow-up diabetic visit. He has type 2 diabetes mellitus. He does not see a podiatrist.Eye exam is not current.   Wheezing. Started 2 days ago.   Itching from waist up. Sometimes itchy on knees. Needs refill on hydrocortisone.   Requesting ear irrigation. Having more difficulty hearing thinks he has wax  Requesting bilateral knee injection. States both knees very painful. Injections generally help.  Results for orders placed or performed in visit on 09/01/15  POCT glycosylated hemoglobin (Hb A1C)  Result Value Ref Range   Hemoglobin A1C 7.0    States compliant with blood pressure medicine. Medications reviewed. No obvious side effects.  Review of Systems Positive bilateral knee pain no current chest pain no shortness of breath with cough no headache complete ROS otherwise negative    Objective:   Physical Exam Alert vitals stable blood pressure good on repeat HEENT significant wax pharynx normal neck supple lungs clear heart regular rhythm other upper torso neck couple excoriations no true rash presacral intertrigo-like rash noted. Feet sensation absent pulses present but diminished knee substantial crepitations swelling next  Patient was prepped draped anesthetized both knees. Both were injected with 1  mL Depo-Medrol +2 mL Xylocaine       Assessment & Plan:  Impression type 2 diabetes control good maintain same #2 hypertension controlled good maintain same #3 hyperlipidemia briefly discussed #43 different rashes nonspecific pruritus with potential anxiety component that occasional rashes, presacral intertrigo rash, and irritation of feet with underlying neuropathy. #5 years significantly loaded with wax #6 progressive arthritis plan all medications to maintain. Proper use of different creams discussed diet exercise discussed. Numerous questions answered on the 6 or 7 problems. 8 or 9 a few count each rash. 40 minutes spent most in discussion regarding his complex case. Numerous questions from relative. Follow-up in several months WSL

## 2015-09-02 ENCOUNTER — Telehealth: Payer: Self-pay | Admitting: Family Medicine

## 2015-09-02 DIAGNOSIS — M170 Bilateral primary osteoarthritis of knee: Secondary | ICD-10-CM | POA: Diagnosis not present

## 2015-09-02 DIAGNOSIS — I509 Heart failure, unspecified: Secondary | ICD-10-CM | POA: Diagnosis not present

## 2015-09-02 DIAGNOSIS — G8929 Other chronic pain: Secondary | ICD-10-CM | POA: Diagnosis not present

## 2015-09-02 DIAGNOSIS — E1140 Type 2 diabetes mellitus with diabetic neuropathy, unspecified: Secondary | ICD-10-CM | POA: Diagnosis not present

## 2015-09-02 DIAGNOSIS — M6281 Muscle weakness (generalized): Secondary | ICD-10-CM | POA: Diagnosis not present

## 2015-09-02 DIAGNOSIS — G250 Essential tremor: Secondary | ICD-10-CM | POA: Diagnosis not present

## 2015-09-02 NOTE — Telephone Encounter (Signed)
Rx prior auth APPROVED for pt's hydrOXYzine (ATARAX/VISTARIL) 25 MG tablet, valid 11/13/14-11/14/16, faxed approval to [facility] Fx# [phone]

## 2015-09-03 DIAGNOSIS — G250 Essential tremor: Secondary | ICD-10-CM | POA: Diagnosis not present

## 2015-09-03 DIAGNOSIS — I509 Heart failure, unspecified: Secondary | ICD-10-CM | POA: Diagnosis not present

## 2015-09-03 DIAGNOSIS — M6281 Muscle weakness (generalized): Secondary | ICD-10-CM | POA: Diagnosis not present

## 2015-09-03 DIAGNOSIS — E1140 Type 2 diabetes mellitus with diabetic neuropathy, unspecified: Secondary | ICD-10-CM | POA: Diagnosis not present

## 2015-09-03 DIAGNOSIS — M170 Bilateral primary osteoarthritis of knee: Secondary | ICD-10-CM | POA: Diagnosis not present

## 2015-09-03 DIAGNOSIS — G8929 Other chronic pain: Secondary | ICD-10-CM | POA: Diagnosis not present

## 2015-09-04 DIAGNOSIS — E1140 Type 2 diabetes mellitus with diabetic neuropathy, unspecified: Secondary | ICD-10-CM | POA: Diagnosis not present

## 2015-09-04 DIAGNOSIS — G250 Essential tremor: Secondary | ICD-10-CM | POA: Diagnosis not present

## 2015-09-04 DIAGNOSIS — G8929 Other chronic pain: Secondary | ICD-10-CM | POA: Diagnosis not present

## 2015-09-04 DIAGNOSIS — I509 Heart failure, unspecified: Secondary | ICD-10-CM | POA: Diagnosis not present

## 2015-09-04 DIAGNOSIS — M170 Bilateral primary osteoarthritis of knee: Secondary | ICD-10-CM | POA: Diagnosis not present

## 2015-09-04 DIAGNOSIS — M6281 Muscle weakness (generalized): Secondary | ICD-10-CM | POA: Diagnosis not present

## 2015-09-07 MED ORDER — METHYLPREDNISOLONE ACETATE 40 MG/ML IJ SUSP: 40.0000 mg | Freq: Once | INTRAMUSCULAR | Status: DC

## 2015-09-09 DIAGNOSIS — E1140 Type 2 diabetes mellitus with diabetic neuropathy, unspecified: Secondary | ICD-10-CM | POA: Diagnosis not present

## 2015-09-09 DIAGNOSIS — M6281 Muscle weakness (generalized): Secondary | ICD-10-CM | POA: Diagnosis not present

## 2015-09-09 DIAGNOSIS — G250 Essential tremor: Secondary | ICD-10-CM | POA: Diagnosis not present

## 2015-09-09 DIAGNOSIS — G8929 Other chronic pain: Secondary | ICD-10-CM | POA: Diagnosis not present

## 2015-09-09 DIAGNOSIS — I509 Heart failure, unspecified: Secondary | ICD-10-CM | POA: Diagnosis not present

## 2015-09-09 DIAGNOSIS — M170 Bilateral primary osteoarthritis of knee: Secondary | ICD-10-CM | POA: Diagnosis not present

## 2015-09-11 DIAGNOSIS — M170 Bilateral primary osteoarthritis of knee: Secondary | ICD-10-CM | POA: Diagnosis not present

## 2015-09-11 DIAGNOSIS — G250 Essential tremor: Secondary | ICD-10-CM | POA: Diagnosis not present

## 2015-09-11 DIAGNOSIS — I509 Heart failure, unspecified: Secondary | ICD-10-CM | POA: Diagnosis not present

## 2015-09-11 DIAGNOSIS — E1140 Type 2 diabetes mellitus with diabetic neuropathy, unspecified: Secondary | ICD-10-CM | POA: Diagnosis not present

## 2015-09-11 DIAGNOSIS — G8929 Other chronic pain: Secondary | ICD-10-CM | POA: Diagnosis not present

## 2015-09-11 DIAGNOSIS — M6281 Muscle weakness (generalized): Secondary | ICD-10-CM | POA: Diagnosis not present

## 2015-09-12 DIAGNOSIS — G8929 Other chronic pain: Secondary | ICD-10-CM | POA: Diagnosis not present

## 2015-09-12 DIAGNOSIS — M6281 Muscle weakness (generalized): Secondary | ICD-10-CM | POA: Diagnosis not present

## 2015-09-12 DIAGNOSIS — M170 Bilateral primary osteoarthritis of knee: Secondary | ICD-10-CM | POA: Diagnosis not present

## 2015-09-12 DIAGNOSIS — I509 Heart failure, unspecified: Secondary | ICD-10-CM | POA: Diagnosis not present

## 2015-09-16 DIAGNOSIS — I509 Heart failure, unspecified: Secondary | ICD-10-CM | POA: Diagnosis not present

## 2015-09-16 DIAGNOSIS — G8929 Other chronic pain: Secondary | ICD-10-CM | POA: Diagnosis not present

## 2015-09-16 DIAGNOSIS — G250 Essential tremor: Secondary | ICD-10-CM | POA: Diagnosis not present

## 2015-09-16 DIAGNOSIS — E1140 Type 2 diabetes mellitus with diabetic neuropathy, unspecified: Secondary | ICD-10-CM | POA: Diagnosis not present

## 2015-09-16 DIAGNOSIS — M6281 Muscle weakness (generalized): Secondary | ICD-10-CM | POA: Diagnosis not present

## 2015-09-16 DIAGNOSIS — M170 Bilateral primary osteoarthritis of knee: Secondary | ICD-10-CM | POA: Diagnosis not present

## 2015-09-18 DIAGNOSIS — M170 Bilateral primary osteoarthritis of knee: Secondary | ICD-10-CM | POA: Diagnosis not present

## 2015-09-18 DIAGNOSIS — G250 Essential tremor: Secondary | ICD-10-CM | POA: Diagnosis not present

## 2015-09-18 DIAGNOSIS — I509 Heart failure, unspecified: Secondary | ICD-10-CM | POA: Diagnosis not present

## 2015-09-18 DIAGNOSIS — G8929 Other chronic pain: Secondary | ICD-10-CM | POA: Diagnosis not present

## 2015-09-18 DIAGNOSIS — E1140 Type 2 diabetes mellitus with diabetic neuropathy, unspecified: Secondary | ICD-10-CM | POA: Diagnosis not present

## 2015-09-18 DIAGNOSIS — M6281 Muscle weakness (generalized): Secondary | ICD-10-CM | POA: Diagnosis not present

## 2015-09-22 DIAGNOSIS — M6281 Muscle weakness (generalized): Secondary | ICD-10-CM | POA: Diagnosis not present

## 2015-09-22 DIAGNOSIS — G250 Essential tremor: Secondary | ICD-10-CM | POA: Diagnosis not present

## 2015-09-22 DIAGNOSIS — E1140 Type 2 diabetes mellitus with diabetic neuropathy, unspecified: Secondary | ICD-10-CM | POA: Diagnosis not present

## 2015-09-22 DIAGNOSIS — M170 Bilateral primary osteoarthritis of knee: Secondary | ICD-10-CM | POA: Diagnosis not present

## 2015-09-22 DIAGNOSIS — I509 Heart failure, unspecified: Secondary | ICD-10-CM | POA: Diagnosis not present

## 2015-09-22 DIAGNOSIS — G8929 Other chronic pain: Secondary | ICD-10-CM | POA: Diagnosis not present

## 2015-09-23 DIAGNOSIS — M170 Bilateral primary osteoarthritis of knee: Secondary | ICD-10-CM | POA: Diagnosis not present

## 2015-09-23 DIAGNOSIS — G8929 Other chronic pain: Secondary | ICD-10-CM | POA: Diagnosis not present

## 2015-09-23 DIAGNOSIS — I509 Heart failure, unspecified: Secondary | ICD-10-CM | POA: Diagnosis not present

## 2015-09-23 DIAGNOSIS — E1140 Type 2 diabetes mellitus with diabetic neuropathy, unspecified: Secondary | ICD-10-CM | POA: Diagnosis not present

## 2015-09-23 DIAGNOSIS — M6281 Muscle weakness (generalized): Secondary | ICD-10-CM | POA: Diagnosis not present

## 2015-09-23 DIAGNOSIS — G250 Essential tremor: Secondary | ICD-10-CM | POA: Diagnosis not present

## 2015-09-24 ENCOUNTER — Telehealth: Payer: Self-pay | Admitting: Family Medicine

## 2015-09-24 NOTE — Telephone Encounter (Signed)
Christian Thomas has sent over a request for a wheel chair x2 from [facility] nor I have not see this come back up front to be faxed back   I have #3 request in your orders folder if we can get this filled out an sent back to  Them as soon as possible please

## 2015-09-25 DIAGNOSIS — E1140 Type 2 diabetes mellitus with diabetic neuropathy, unspecified: Secondary | ICD-10-CM | POA: Diagnosis not present

## 2015-09-25 DIAGNOSIS — M170 Bilateral primary osteoarthritis of knee: Secondary | ICD-10-CM | POA: Diagnosis not present

## 2015-09-25 DIAGNOSIS — I509 Heart failure, unspecified: Secondary | ICD-10-CM | POA: Diagnosis not present

## 2015-09-25 DIAGNOSIS — M6281 Muscle weakness (generalized): Secondary | ICD-10-CM | POA: Diagnosis not present

## 2015-09-25 DIAGNOSIS — G8929 Other chronic pain: Secondary | ICD-10-CM | POA: Diagnosis not present

## 2015-09-25 DIAGNOSIS — G250 Essential tremor: Secondary | ICD-10-CM | POA: Diagnosis not present

## 2015-09-29 ENCOUNTER — Telehealth: Payer: Self-pay | Admitting: Family Medicine

## 2015-09-29 DIAGNOSIS — G250 Essential tremor: Secondary | ICD-10-CM | POA: Diagnosis not present

## 2015-09-29 DIAGNOSIS — E1140 Type 2 diabetes mellitus with diabetic neuropathy, unspecified: Secondary | ICD-10-CM | POA: Diagnosis not present

## 2015-09-29 DIAGNOSIS — I509 Heart failure, unspecified: Secondary | ICD-10-CM | POA: Diagnosis not present

## 2015-09-29 DIAGNOSIS — M170 Bilateral primary osteoarthritis of knee: Secondary | ICD-10-CM | POA: Diagnosis not present

## 2015-09-29 DIAGNOSIS — G8929 Other chronic pain: Secondary | ICD-10-CM | POA: Diagnosis not present

## 2015-09-29 DIAGNOSIS — M6281 Muscle weakness (generalized): Secondary | ICD-10-CM | POA: Diagnosis not present

## 2015-09-29 NOTE — Telephone Encounter (Signed)
Please see note below  (I have faxed in the order you signed this morning for wheelchair)  OV note will still need the additional narrative per Medicare requirements   Also, there needs to be a diagnosis that would cover need for wheelchair, [place] states the patient is very weak and barely able to walk (hope this helps)

## 2015-09-29 NOTE — Telephone Encounter (Signed)
Need further documentation and narrative for wheelchair order for patient Please see green folder Please advise   All information needs to be faxed to Attn: Danise Edge [phone]

## 2015-10-01 DIAGNOSIS — I509 Heart failure, unspecified: Secondary | ICD-10-CM | POA: Diagnosis not present

## 2015-10-01 DIAGNOSIS — E1140 Type 2 diabetes mellitus with diabetic neuropathy, unspecified: Secondary | ICD-10-CM | POA: Diagnosis not present

## 2015-10-01 DIAGNOSIS — G250 Essential tremor: Secondary | ICD-10-CM | POA: Diagnosis not present

## 2015-10-01 DIAGNOSIS — G8929 Other chronic pain: Secondary | ICD-10-CM | POA: Diagnosis not present

## 2015-10-01 DIAGNOSIS — M6281 Muscle weakness (generalized): Secondary | ICD-10-CM | POA: Diagnosis not present

## 2015-10-01 DIAGNOSIS — M170 Bilateral primary osteoarthritis of knee: Secondary | ICD-10-CM | POA: Diagnosis not present

## 2015-10-03 ENCOUNTER — Telehealth: Payer: Self-pay | Admitting: Family Medicine

## 2015-10-03 NOTE — Telephone Encounter (Signed)
Order written out and faxed  per Dr.Steve Luking.

## 2015-10-03 NOTE — Telephone Encounter (Signed)
Christian Thomas is needing an order faxed over stating that it is ok for him to have his hydrocortisone cream put on with his nightly meds instead of at 8pm. Pt receives his nightly meds at 6 and doesn't want to be waken up at 8 or after for them to apply the cream.

## 2015-10-03 NOTE — Telephone Encounter (Signed)
ok 

## 2015-10-22 DIAGNOSIS — E1151 Type 2 diabetes mellitus with diabetic peripheral angiopathy without gangrene: Secondary | ICD-10-CM | POA: Diagnosis not present

## 2015-10-22 DIAGNOSIS — L84 Corns and callosities: Secondary | ICD-10-CM | POA: Diagnosis not present

## 2015-10-23 ENCOUNTER — Ambulatory Visit (INDEPENDENT_AMBULATORY_CARE_PROVIDER_SITE_OTHER): Payer: Medicare Other | Admitting: Otolaryngology

## 2015-10-23 DIAGNOSIS — H6121 Impacted cerumen, right ear: Secondary | ICD-10-CM

## 2015-10-23 DIAGNOSIS — H903 Sensorineural hearing loss, bilateral: Secondary | ICD-10-CM

## 2015-11-11 ENCOUNTER — Telehealth: Payer: Self-pay | Admitting: Family Medicine

## 2015-11-11 ENCOUNTER — Other Ambulatory Visit: Payer: Self-pay | Admitting: *Deleted

## 2015-11-11 MED ORDER — AMOXICILLIN 500 MG PO CAPS: 500.0000 mg | ORAL_CAPSULE | Freq: Three times a day (TID) | ORAL | Status: DC

## 2015-11-11 MED ORDER — AMOXICILLIN 500 MG PO CAPS
500.0000 mg | ORAL_CAPSULE | Freq: Three times a day (TID) | ORAL | Status: DC
Start: 2015-11-11 — End: 2016-06-07

## 2015-11-11 NOTE — Telephone Encounter (Signed)
Discussed with [name]. Med sent to [facility]. Order also sent to [facility].

## 2015-11-11 NOTE — Telephone Encounter (Signed)
amox 500 tid ten d 

## 2015-11-11 NOTE — Telephone Encounter (Signed)
Spoke with patient's daughter to discuss more of patient's symptoms. Patient's daughter stated patient has c/o sore throat, hoarse voice and runny nose. Informed patient office visit may be needed since patient has not been seen recently for this issue. Patient daughter verbalized understanding

## 2015-11-11 NOTE — Telephone Encounter (Signed)
Patient has sore throat, headache and sinus issues.  This just started.  Patient's daughter wants to know if we can send something in.   [employer]

## 2015-12-03 ENCOUNTER — Ambulatory Visit (INDEPENDENT_AMBULATORY_CARE_PROVIDER_SITE_OTHER): Payer: Medicare Other | Admitting: Family Medicine

## 2015-12-03 ENCOUNTER — Encounter: Payer: Self-pay | Admitting: Family Medicine

## 2015-12-03 VITALS — BP 122/68 | Ht 74.0 in | Wt 198.0 lb

## 2015-12-03 DIAGNOSIS — E785 Hyperlipidemia, unspecified: Secondary | ICD-10-CM

## 2015-12-03 DIAGNOSIS — I10 Essential (primary) hypertension: Secondary | ICD-10-CM | POA: Diagnosis not present

## 2015-12-03 DIAGNOSIS — E119 Type 2 diabetes mellitus without complications: Secondary | ICD-10-CM

## 2015-12-03 LAB — POCT GLYCOSYLATED HEMOGLOBIN (HGB A1C): HEMOGLOBIN A1C: 7.3

## 2015-12-03 MED ORDER — KETOCONAZOLE 2 % EX CREA: TOPICAL_CREAM | CUTANEOUS | Status: DC

## 2015-12-03 MED ORDER — FIRST-DUKES MOUTHWASH MT SUSP: OROMUCOSAL | Status: DC

## 2015-12-03 MED ORDER — CEFDINIR 300 MG PO CAPS: 300.0000 mg | ORAL_CAPSULE | Freq: Two times a day (BID) | ORAL | Status: DC

## 2015-12-03 MED ORDER — PREDNISONE 10 MG PO TABS: ORAL_TABLET | ORAL | Status: DC

## 2015-12-03 MED ORDER — ALBUTEROL SULFATE (2.5 MG/3ML) 0.083% IN NEBU: 2.5000 mg | INHALATION_SOLUTION | Freq: Four times a day (QID) | RESPIRATORY_TRACT | Status: DC | PRN

## 2015-12-03 NOTE — Progress Notes (Signed)
   Subjective:    Patient ID: Christian Thomas, male    DOB: 02-26-1915, 82 y.o.   MRN: [id number] Pt arrives with niece [name].  Diabetes He presents for his follow-up diabetic visit. He has type 2 diabetes mellitus. He is compliant with treatment all of the time. His overall blood glucose range is 130-140 mg/dl. He does not see a podiatrist.Eye exam is current (may 2016).  A1C 7.3 today. Knee pain. Wants injection.   Break out on bottom.    Review of Systems     Objective:   Physical Exam        Assessment & Plan:

## 2015-12-04 LAB — LIPID PANEL
CHOLESTEROL TOTAL: 159 mg/dL (ref 100–199)
Chol/HDL Ratio: 3 ratio units (ref 0.0–5.0)
HDL: 53 mg/dL (ref >39)
LDL Calculated: 58 mg/dL (ref 0–99)
Triglycerides: 242 mg/dL — ABNORMAL HIGH (ref 0–149)
VLDL CHOLESTEROL CAL: 48 mg/dL — AB (ref 5–40)

## 2015-12-04 LAB — HEPATIC FUNCTION PANEL
ALK PHOS: 79 IU/L (ref 39–117)
ALT: 17 IU/L (ref 0–44)
AST: 18 IU/L (ref 0–40)
Albumin: 4.2 g/dL (ref 3.2–4.6)
Bilirubin Total: 0.3 mg/dL (ref 0.0–1.2)
Bilirubin, Direct: 0.09 mg/dL (ref 0.00–0.40)
Total Protein: 6.3 g/dL (ref 6.0–8.5)

## 2015-12-04 LAB — BASIC METABOLIC PANEL
BUN/Creatinine Ratio: 22 (ref 10–22)
BUN: 28 mg/dL (ref 10–36)
CO2: 24 mmol/L (ref 18–29)
Calcium: 9.2 mg/dL (ref 8.6–10.2)
Chloride: 99 mmol/L (ref 96–106)
Creatinine, Ser: 1.3 mg/dL — ABNORMAL HIGH (ref 0.76–1.27)
GFR calc Af Amer: 52 mL/min/{1.73_m2} — ABNORMAL LOW (ref >59)
GFR calc non Af Amer: 45 mL/min/{1.73_m2} — ABNORMAL LOW (ref >59)
GLUCOSE: 171 mg/dL — AB (ref 65–99)
POTASSIUM: 5.8 mmol/L — AB (ref 3.5–5.2)
SODIUM: 137 mmol/L (ref 134–144)

## 2015-12-14 ENCOUNTER — Encounter: Payer: Self-pay | Admitting: Family Medicine

## 2015-12-19 ENCOUNTER — Telehealth: Payer: Self-pay | Admitting: Family Medicine

## 2015-12-19 NOTE — Telephone Encounter (Signed)
Rx prior auth APPROVED for pt's hydrOXYzine (ATARAX/VISTARIL) 25 MG tablet, valid 12/17/15 - until further notice through [facility]

## 2015-12-23 ENCOUNTER — Telehealth: Payer: Self-pay | Admitting: Family Medicine

## 2015-12-23 NOTE — Telephone Encounter (Signed)
Pt has some FL2 forms that need to be singed an sent back today if possible From [facility], they state they have sent them several times. I can remember one From last week that went back in the orders folder    See your yellow forms folder in your office

## 2015-12-25 NOTE — Telephone Encounter (Signed)
Erica faxed this 2/8

## 2016-01-14 DIAGNOSIS — E1151 Type 2 diabetes mellitus with diabetic peripheral angiopathy without gangrene: Secondary | ICD-10-CM | POA: Diagnosis not present

## 2016-01-14 DIAGNOSIS — L84 Corns and callosities: Secondary | ICD-10-CM | POA: Diagnosis not present

## 2016-01-23 ENCOUNTER — Telehealth: Payer: Self-pay | Admitting: Family Medicine

## 2016-01-23 MED ORDER — CEFDINIR 300 MG PO CAPS: ORAL_CAPSULE | ORAL | Status: DC

## 2016-01-23 NOTE — Telephone Encounter (Signed)
Spoke with Med tech at [facility] living and informed her per Dr.Scott Luking- Dr.Scott recommends omnicef 300 mg 1 tablet by mouth  Twice a day for 10 days. Dr.Scott also recommends that the staff monitor his temperature if he starts running fever 100.5 or greater they should take him to emergency department. He should follow up here in office next if not improving. Med tech verbalized understanding order to be faxed.

## 2016-01-23 NOTE — Telephone Encounter (Signed)
I would recommend Omnicef 300 mg 1 by mouth twice a day 10 days. I also recommend that the staff monitor his temperature if he starts running fever 100.5 or greater they should take him to the emergency department. He should follow-up here in the office next week if not improving.

## 2016-01-23 NOTE — Telephone Encounter (Signed)
Received a fax from [facility] stating that patient has been very congested and has low grade temperature and that family is requesting medication. Called and spoke with med tech at [facility] to get more information. Was told that patient has c/o Cough, congestion. Onset a few days ago. Med tech unable to give me temperature readings due to no documentation of temperature. They have been treating patient with Robitussin. States no c/o of SOB. Please advise?

## 2016-02-19 ENCOUNTER — Telehealth: Payer: Self-pay | Admitting: Family Medicine

## 2016-02-19 NOTE — Telephone Encounter (Signed)
Order faxed to [facility]. [name] was notified.

## 2016-02-19 NOTE — Telephone Encounter (Signed)
pts family calling to say that PT at [facility]  Was saying they feel that he would benefit  From in house PT for the weakness in his legs  And the SOB w/exertion. They told her to call here  And see if you would be willing to send an order over For PT.   Advised PT would normally call themselves and we may need  Them too, she states she thinks they may call as well.

## 2016-02-19 NOTE — Telephone Encounter (Signed)
Sure, order PT might help

## 2016-03-02 ENCOUNTER — Ambulatory Visit (INDEPENDENT_AMBULATORY_CARE_PROVIDER_SITE_OTHER): Payer: Medicare Other | Admitting: Family Medicine

## 2016-03-02 ENCOUNTER — Encounter: Payer: Self-pay | Admitting: Family Medicine

## 2016-03-02 VITALS — BP 120/72 | Ht 74.0 in | Wt 196.1 lb

## 2016-03-02 DIAGNOSIS — N400 Enlarged prostate without lower urinary tract symptoms: Secondary | ICD-10-CM | POA: Diagnosis not present

## 2016-03-02 DIAGNOSIS — E785 Hyperlipidemia, unspecified: Secondary | ICD-10-CM | POA: Diagnosis not present

## 2016-03-02 DIAGNOSIS — H6123 Impacted cerumen, bilateral: Secondary | ICD-10-CM | POA: Diagnosis not present

## 2016-03-02 DIAGNOSIS — I10 Essential (primary) hypertension: Secondary | ICD-10-CM | POA: Diagnosis not present

## 2016-03-02 DIAGNOSIS — R42 Dizziness and giddiness: Secondary | ICD-10-CM | POA: Diagnosis not present

## 2016-03-02 DIAGNOSIS — J4521 Mild intermittent asthma with (acute) exacerbation: Secondary | ICD-10-CM | POA: Diagnosis not present

## 2016-03-02 DIAGNOSIS — E119 Type 2 diabetes mellitus without complications: Secondary | ICD-10-CM

## 2016-03-02 DIAGNOSIS — G609 Hereditary and idiopathic neuropathy, unspecified: Secondary | ICD-10-CM | POA: Diagnosis not present

## 2016-03-02 DIAGNOSIS — M170 Bilateral primary osteoarthritis of knee: Secondary | ICD-10-CM

## 2016-03-02 LAB — POCT GLYCOSYLATED HEMOGLOBIN (HGB A1C): Hemoglobin A1C: 7.3

## 2016-03-02 MED ORDER — PREDNISONE 20 MG PO TABS: ORAL_TABLET | ORAL | Status: DC

## 2016-03-02 NOTE — Progress Notes (Signed)
   Subjective:    Patient ID: Christian Thomas, male    DOB: 01-22-1915, 82 y.o.   MRN: [id number]  Diabetes He presents for his follow-up diabetic visit. He has type 2 diabetes mellitus. There are no hypoglycemic associated symptoms. There are no diabetic associated symptoms. There are no hypoglycemic complications. There are no diabetic complications. There are no known risk factors for coronary artery disease. Current diabetic treatment includes oral agent (monotherapy). He is compliant with treatment all of the time.   Results for orders placed or performed in visit on 03/02/16  POCT glycosylated hemoglobin (Hb A1C)  Result Value Ref Range   Hemoglobin A1C 7.3      Patient states that he wants his knees injected today. Ongoing challenging knee pain. Both knees. Severe arthritis here. Any kind of movement creates pain. Some swelling at times.  Patient states he has shortness of breath., Patient is been explained to more wheezing lately. Using the albuterol about once per day.  Patient is having sinus drainage.  Patient states that his shoulders and neck is hurting also., Worse with certain motions.   Patient states that his elbows feel numb. , Generally positional. Arms to not become weak. Just notes numbness mostly in an ulnar distribution based on position 1 trying to sleep at night. Next  Ongoing challenge with itching. Prefers to use his own over-the-counter lotions now versus prescription creams. See prior notes that regard.  Patient is needing a face to face visit and medical diagnosis before PT can work with him on his knees.    Review of Systems No headache, no major weight loss or weight gain, no chest pain no back pain abdominal pain no change in bowel habits complete ROS otherwise negative     Objective:   Physical Exam Alert vital stable clearly hard of hearing. External canals trace wax right ear substantial 50% left ear pharynx normal lungs bilateral wheezes no  tachypnea no crackles heart regular in rhythm. Abdomen soft knees bilateral crepitations. Feet dense neuropathy sensation pretty much absent pulses present but diminished some low back pain with percussion. Next  Patient was prepped draped anesthetized and injected both knees., 1 mL Depo-Medrol 2 mL plain Xylocaine       Assessment & Plan:  Impression 1 type 2 diabetes overall good control discussed #2 flare of asthma discussed need for intervention #3 progressive joint pain knees and back and to certain extent shoulders. May benefit from physical therapy assessment and intervention. #4 dense and serious neuropathy discussed 5 rash concerns will go with over-the-counter meds at this time. #6 chronic pain ongoing challenge #7 hypertension decent control at this time plan physical therapy will be attempted. Diet exercise discussed. Prednisone taper. Increase use of albuterol. 45 minute spent with patient most in discussion and assessment of his tremendous number of problems. #8 dense sensory neuropathy bilateral, adding to patient's difficulty with mobility WSL

## 2016-03-03 ENCOUNTER — Telehealth: Payer: Self-pay | Admitting: Family Medicine

## 2016-03-03 DIAGNOSIS — J45901 Unspecified asthma with (acute) exacerbation: Secondary | ICD-10-CM | POA: Insufficient documentation

## 2016-03-03 MED ORDER — METHYLPREDNISOLONE ACETATE 40 MG/ML IJ SUSP: 40.0000 mg | Freq: Once | INTRAMUSCULAR | Status: DC

## 2016-03-03 NOTE — Telephone Encounter (Signed)
Christian Thomas is requesting notes from 4/18. Are notes ready from yesterday visit.

## 2016-03-04 DIAGNOSIS — E1142 Type 2 diabetes mellitus with diabetic polyneuropathy: Secondary | ICD-10-CM | POA: Diagnosis not present

## 2016-03-04 DIAGNOSIS — I509 Heart failure, unspecified: Secondary | ICD-10-CM | POA: Diagnosis not present

## 2016-03-04 DIAGNOSIS — M1711 Unilateral primary osteoarthritis, right knee: Secondary | ICD-10-CM | POA: Diagnosis not present

## 2016-03-04 DIAGNOSIS — M6281 Muscle weakness (generalized): Secondary | ICD-10-CM | POA: Diagnosis not present

## 2016-03-04 DIAGNOSIS — M1712 Unilateral primary osteoarthritis, left knee: Secondary | ICD-10-CM | POA: Diagnosis not present

## 2016-03-04 DIAGNOSIS — H542 Low vision, both eyes: Secondary | ICD-10-CM | POA: Diagnosis not present

## 2016-03-04 DIAGNOSIS — Z7984 Long term (current) use of oral hypoglycemic drugs: Secondary | ICD-10-CM | POA: Diagnosis not present

## 2016-03-04 DIAGNOSIS — Z7952 Long term (current) use of systemic steroids: Secondary | ICD-10-CM | POA: Diagnosis not present

## 2016-03-04 DIAGNOSIS — H3530 Unspecified macular degeneration: Secondary | ICD-10-CM | POA: Diagnosis not present

## 2016-03-04 DIAGNOSIS — I110 Hypertensive heart disease with heart failure: Secondary | ICD-10-CM | POA: Diagnosis not present

## 2016-03-04 DIAGNOSIS — J45901 Unspecified asthma with (acute) exacerbation: Secondary | ICD-10-CM | POA: Diagnosis not present

## 2016-03-08 DIAGNOSIS — E1142 Type 2 diabetes mellitus with diabetic polyneuropathy: Secondary | ICD-10-CM | POA: Diagnosis not present

## 2016-03-08 DIAGNOSIS — J45901 Unspecified asthma with (acute) exacerbation: Secondary | ICD-10-CM | POA: Diagnosis not present

## 2016-03-08 DIAGNOSIS — M1711 Unilateral primary osteoarthritis, right knee: Secondary | ICD-10-CM | POA: Diagnosis not present

## 2016-03-08 DIAGNOSIS — M6281 Muscle weakness (generalized): Secondary | ICD-10-CM | POA: Diagnosis not present

## 2016-03-08 DIAGNOSIS — I110 Hypertensive heart disease with heart failure: Secondary | ICD-10-CM | POA: Diagnosis not present

## 2016-03-08 DIAGNOSIS — M1712 Unilateral primary osteoarthritis, left knee: Secondary | ICD-10-CM | POA: Diagnosis not present

## 2016-03-10 DIAGNOSIS — M1711 Unilateral primary osteoarthritis, right knee: Secondary | ICD-10-CM | POA: Diagnosis not present

## 2016-03-10 DIAGNOSIS — M1712 Unilateral primary osteoarthritis, left knee: Secondary | ICD-10-CM | POA: Diagnosis not present

## 2016-03-10 DIAGNOSIS — M6281 Muscle weakness (generalized): Secondary | ICD-10-CM | POA: Diagnosis not present

## 2016-03-10 DIAGNOSIS — E1142 Type 2 diabetes mellitus with diabetic polyneuropathy: Secondary | ICD-10-CM | POA: Diagnosis not present

## 2016-03-11 DIAGNOSIS — M1711 Unilateral primary osteoarthritis, right knee: Secondary | ICD-10-CM | POA: Diagnosis not present

## 2016-03-11 DIAGNOSIS — M1712 Unilateral primary osteoarthritis, left knee: Secondary | ICD-10-CM | POA: Diagnosis not present

## 2016-03-11 DIAGNOSIS — I110 Hypertensive heart disease with heart failure: Secondary | ICD-10-CM | POA: Diagnosis not present

## 2016-03-11 DIAGNOSIS — J45901 Unspecified asthma with (acute) exacerbation: Secondary | ICD-10-CM | POA: Diagnosis not present

## 2016-03-11 DIAGNOSIS — E1142 Type 2 diabetes mellitus with diabetic polyneuropathy: Secondary | ICD-10-CM | POA: Diagnosis not present

## 2016-03-11 DIAGNOSIS — M6281 Muscle weakness (generalized): Secondary | ICD-10-CM | POA: Diagnosis not present

## 2016-03-16 DIAGNOSIS — J45901 Unspecified asthma with (acute) exacerbation: Secondary | ICD-10-CM | POA: Diagnosis not present

## 2016-03-16 DIAGNOSIS — M1712 Unilateral primary osteoarthritis, left knee: Secondary | ICD-10-CM | POA: Diagnosis not present

## 2016-03-16 DIAGNOSIS — E1142 Type 2 diabetes mellitus with diabetic polyneuropathy: Secondary | ICD-10-CM | POA: Diagnosis not present

## 2016-03-16 DIAGNOSIS — M1711 Unilateral primary osteoarthritis, right knee: Secondary | ICD-10-CM | POA: Diagnosis not present

## 2016-03-16 DIAGNOSIS — I110 Hypertensive heart disease with heart failure: Secondary | ICD-10-CM | POA: Diagnosis not present

## 2016-03-16 DIAGNOSIS — M6281 Muscle weakness (generalized): Secondary | ICD-10-CM | POA: Diagnosis not present

## 2016-03-18 DIAGNOSIS — M1711 Unilateral primary osteoarthritis, right knee: Secondary | ICD-10-CM | POA: Diagnosis not present

## 2016-03-18 DIAGNOSIS — I110 Hypertensive heart disease with heart failure: Secondary | ICD-10-CM | POA: Diagnosis not present

## 2016-03-18 DIAGNOSIS — M6281 Muscle weakness (generalized): Secondary | ICD-10-CM | POA: Diagnosis not present

## 2016-03-18 DIAGNOSIS — J45901 Unspecified asthma with (acute) exacerbation: Secondary | ICD-10-CM | POA: Diagnosis not present

## 2016-03-18 DIAGNOSIS — M1712 Unilateral primary osteoarthritis, left knee: Secondary | ICD-10-CM | POA: Diagnosis not present

## 2016-03-18 DIAGNOSIS — E1142 Type 2 diabetes mellitus with diabetic polyneuropathy: Secondary | ICD-10-CM | POA: Diagnosis not present

## 2016-03-29 ENCOUNTER — Other Ambulatory Visit: Payer: Self-pay | Admitting: *Deleted

## 2016-03-29 MED ORDER — ALPRAZOLAM 0.25 MG PO TABS: ORAL_TABLET | ORAL | Status: DC

## 2016-03-29 MED ORDER — TRAMADOL HCL 50 MG PO TABS: ORAL_TABLET | ORAL | Status: DC

## 2016-03-29 NOTE — Telephone Encounter (Signed)
Six mo ref ok

## 2016-03-31 DIAGNOSIS — L84 Corns and callosities: Secondary | ICD-10-CM | POA: Diagnosis not present

## 2016-03-31 DIAGNOSIS — E1151 Type 2 diabetes mellitus with diabetic peripheral angiopathy without gangrene: Secondary | ICD-10-CM | POA: Diagnosis not present

## 2016-04-16 ENCOUNTER — Telehealth: Payer: Self-pay | Admitting: Family Medicine

## 2016-04-16 MED ORDER — GLIPIZIDE ER 2.5 MG PO TB24: 2.5000 mg | ORAL_TABLET | Freq: Every day | ORAL | Status: DC

## 2016-04-16 NOTE — Telephone Encounter (Signed)
Received incoming fax from [facility] stating that patient has had an increase in blood sugars the past few weeks. Please review fax and log of blood sugars faxed from [facility]. Form in yellow folder in Dr.Steve's office.

## 2016-04-16 NOTE — Telephone Encounter (Signed)
Prescription sent into pharmacy. Order awaiting signature so it can be faxed to [facility].

## 2016-04-16 NOTE — Telephone Encounter (Signed)
[facility] and spoke with Levada Dy  gave the orders per Dr.Steve Luking- Glucotrol 2.5 mg each morning to add to current medications. Call us in one week with fasting blood sugars. Levada Dy verbalized understanding.

## 2016-04-16 NOTE — Telephone Encounter (Signed)
[facility] this morn, initate glucotrol 2.5 mg each morning to add to current meds, call us in one weeks with fasting sugar reports

## 2016-04-19 ENCOUNTER — Telehealth: Payer: Self-pay | Admitting: *Deleted

## 2016-04-19 NOTE — Telephone Encounter (Signed)
See 04/16/16 message

## 2016-04-26 ENCOUNTER — Other Ambulatory Visit: Payer: Self-pay | Admitting: *Deleted

## 2016-04-26 ENCOUNTER — Telehealth: Payer: Self-pay | Admitting: Family Medicine

## 2016-04-26 ENCOUNTER — Telehealth: Payer: Self-pay | Admitting: *Deleted

## 2016-04-26 MED ORDER — ALPRAZOLAM 0.25 MG PO TABS: ORAL_TABLET | ORAL | Status: DC

## 2016-04-26 MED ORDER — ALBUTEROL SULFATE (2.5 MG/3ML) 0.083% IN NEBU: 2.5000 mg | INHALATION_SOLUTION | Freq: Four times a day (QID) | RESPIRATORY_TRACT | Status: DC | PRN

## 2016-04-26 NOTE — Telephone Encounter (Signed)
Refilled in May for 6 months. Pharm states they never received script. Angela at [facility] notified that script is ready for pickup.

## 2016-04-26 NOTE — Telephone Encounter (Signed)
Fax from [facility] stating family is requesting albuterol be changed to something else because it is not working. Called to get more infor. Using albuterol inhaler 2 -3 times a day. Wheezing mostly in the am. No fever. Consult with dr Richardson Landry. Change to albuterol  Neb. rx for med and neb machine sent to [facility] and to [facility].

## 2016-04-26 NOTE — Telephone Encounter (Signed)
For the script/order sent to [facility] this morning that states  To cont order on his xanax need to have hard script  Call when ready to pick up    Christian Thomas

## 2016-06-07 ENCOUNTER — Ambulatory Visit (INDEPENDENT_AMBULATORY_CARE_PROVIDER_SITE_OTHER): Payer: Medicare Other | Admitting: Family Medicine

## 2016-06-07 ENCOUNTER — Encounter: Payer: Self-pay | Admitting: Family Medicine

## 2016-06-07 ENCOUNTER — Telehealth: Payer: Self-pay | Admitting: Family Medicine

## 2016-06-07 VITALS — BP 138/78 | Ht 74.0 in | Wt 194.4 lb

## 2016-06-07 DIAGNOSIS — E119 Type 2 diabetes mellitus without complications: Secondary | ICD-10-CM

## 2016-06-07 DIAGNOSIS — I10 Essential (primary) hypertension: Secondary | ICD-10-CM | POA: Diagnosis not present

## 2016-06-07 DIAGNOSIS — M170 Bilateral primary osteoarthritis of knee: Secondary | ICD-10-CM | POA: Diagnosis not present

## 2016-06-07 DIAGNOSIS — E785 Hyperlipidemia, unspecified: Secondary | ICD-10-CM

## 2016-06-07 DIAGNOSIS — J4521 Mild intermittent asthma with (acute) exacerbation: Secondary | ICD-10-CM | POA: Diagnosis not present

## 2016-06-07 LAB — POCT GLYCOSYLATED HEMOGLOBIN (HGB A1C): Hemoglobin A1C: 6.7

## 2016-06-07 MED ORDER — AMLODIPINE BESYLATE 5 MG PO TABS: 5.0000 mg | ORAL_TABLET | Freq: Every day | ORAL | 5 refills | Status: DC

## 2016-06-07 MED ORDER — OMEPRAZOLE 20 MG PO CPDR: 20.0000 mg | DELAYED_RELEASE_CAPSULE | Freq: Two times a day (BID) | ORAL | 5 refills | Status: AC

## 2016-06-07 MED ORDER — LOVASTATIN 40 MG PO TABS: ORAL_TABLET | ORAL | 5 refills | Status: DC

## 2016-06-07 MED ORDER — AMOXICILLIN 500 MG PO CAPS: 500.0000 mg | ORAL_CAPSULE | Freq: Three times a day (TID) | ORAL | 0 refills | Status: DC

## 2016-06-07 MED ORDER — GLIPIZIDE ER 2.5 MG PO TB24: 2.5000 mg | ORAL_TABLET | Freq: Every day | ORAL | 5 refills | Status: DC

## 2016-06-07 MED ORDER — ALBUTEROL SULFATE HFA 108 (90 BASE) MCG/ACT IN AERS: 2.0000 | INHALATION_SPRAY | Freq: Four times a day (QID) | RESPIRATORY_TRACT | 5 refills | Status: DC | PRN

## 2016-06-07 MED ORDER — METFORMIN HCL 500 MG PO TABS: 500.0000 mg | ORAL_TABLET | Freq: Two times a day (BID) | ORAL | 5 refills | Status: DC

## 2016-06-07 MED ORDER — PREDNISONE 20 MG PO TABS: ORAL_TABLET | ORAL | 0 refills | Status: DC

## 2016-06-07 MED ORDER — ALLOPURINOL 300 MG PO TABS: ORAL_TABLET | ORAL | 5 refills | Status: DC

## 2016-06-07 MED ORDER — ENALAPRIL MALEATE 10 MG PO TABS: ORAL_TABLET | ORAL | 5 refills | Status: DC

## 2016-06-07 MED ORDER — FINASTERIDE 5 MG PO TABS: 5.0000 mg | ORAL_TABLET | Freq: Every day | ORAL | 5 refills | Status: DC

## 2016-06-07 NOTE — Progress Notes (Signed)
   Subjective:    Patient ID: Christian Thomas, male    DOB: 10-04-1915, 82 y.o.   MRN: [id number]  Diabetes  He presents for his follow-up diabetic visit. He has type 2 diabetes mellitus. Risk factors for coronary artery disease include diabetes mellitus. Current diabetic treatment includes oral agent (monotherapy). He is compliant with treatment all of the time. His weight is stable. He is following a diabetic diet. He has not had a previous visit with a dietitian. He does not see a podiatrist.Eye exam is not current.   Patient would like both knees injected, Experiencing ongoing pain in knees. States injections in the past and definitely helped.  Noticing worsening of breathing. Further wheezing. More short of breath with any type of exertion. Having to use albuterol more often.  Sugars generally have been good in the low 100s.  Blood pressure sent away had been all over the map  Results for orders placed or performed in visit on 06/07/16  POCT glycosylated hemoglobin (Hb A1C)  Result Value Ref Range   Hemoglobin A1C 6.7     Patient with c/o wheezing and SOB-sometimes feels like he can't catch his breath-dry cough    Review of Systems No headache, no major weight loss or weight gain, no chest pain no back pain abdominal pain no change in bowel habits complete ROS otherwise negative     Objective:   Physical Exam  Alert vitals stable blood pressure decent on repeat HEENT Baseline lungs diminished breath sounds diffusely no tachypnea positive wheezing heart regular in rhythm abdomen benign ankles without significant edema feet sensation diminished  Both knees positive effusion positive severe crepitations with extension next  Patient was prepped draped anesthetized and injected 1 mL Depo-Medrol 2 mL plain Xylocaine      Assessment & Plan:  Impression 1 exacerbation COPD discussed #2 hypertension clinically stable to maintain same #3 type 2 diabetes control decent. To maintain  same #4 knee arthritis with injections plan recheck in 3 months. Medications clarify. Antibiotics and trial of steroids for breathing. Anticipate increasing sugar follow-up as scheduled

## 2016-06-07 NOTE — Telephone Encounter (Signed)
Received fax from [facility] in regards to prescription written for Biofreeze. Facility needs clarification. Prescription was written for Biofreeze to knees as needed. Facility needs to know the frequency. Is is 4 times a day as needed, etc. Please advise?

## 2016-06-08 ENCOUNTER — Telehealth: Payer: Self-pay | Admitting: Family Medicine

## 2016-06-08 LAB — LIPID PANEL
CHOL/HDL RATIO: 2.5 ratio (ref 0.0–5.0)
Cholesterol, Total: 140 mg/dL (ref 100–199)
HDL: 55 mg/dL (ref >39)
LDL CALC: 39 mg/dL (ref 0–99)
Triglycerides: 231 mg/dL — ABNORMAL HIGH (ref 0–149)
VLDL CHOLESTEROL CAL: 46 mg/dL — AB (ref 5–40)

## 2016-06-08 LAB — HEPATIC FUNCTION PANEL
ALK PHOS: 77 IU/L (ref 39–117)
ALT: 12 IU/L (ref 0–44)
AST: 16 IU/L (ref 0–40)
Albumin: 4.2 g/dL (ref 3.2–4.6)
Bilirubin Total: 0.3 mg/dL (ref 0.0–1.2)
Bilirubin, Direct: 0.11 mg/dL (ref 0.00–0.40)
TOTAL PROTEIN: 6.2 g/dL (ref 6.0–8.5)

## 2016-06-08 MED ORDER — METHYLPREDNISOLONE ACETATE 40 MG/ML IJ SUSP: 40.0000 mg | Freq: Once | INTRAMUSCULAR | Status: DC

## 2016-06-08 NOTE — Telephone Encounter (Signed)
Apply qid to affected area

## 2016-06-08 NOTE — Telephone Encounter (Signed)
Order written with directions. Will be faxed to [facility] living per [employer].

## 2016-06-08 NOTE — Telephone Encounter (Signed)
Patient needs clarification order for biofreeze.   (f) [phone]

## 2016-06-10 ENCOUNTER — Telehealth: Payer: Self-pay | Admitting: Family Medicine

## 2016-06-10 NOTE — Telephone Encounter (Signed)
Received another fax from [facility] in regards to patient's Biofreeze. The pharmacy in which they sent the original script to [facility] is requesting the Strength of Biofreeze that you would like. Please advise?

## 2016-06-10 NOTE — Telephone Encounter (Signed)
This was requested by a family member, I have never prescribed it, regular otc strength

## 2016-06-10 NOTE — Telephone Encounter (Signed)
Called pharmacy and informed them prescription strength gel. Pharmacy verbalized understanding.

## 2016-06-13 ENCOUNTER — Encounter: Payer: Self-pay | Admitting: Family Medicine

## 2016-06-23 DIAGNOSIS — E1151 Type 2 diabetes mellitus with diabetic peripheral angiopathy without gangrene: Secondary | ICD-10-CM | POA: Diagnosis not present

## 2016-06-23 DIAGNOSIS — L84 Corns and callosities: Secondary | ICD-10-CM | POA: Diagnosis not present

## 2016-06-30 ENCOUNTER — Telehealth: Payer: Self-pay | Admitting: Family Medicine

## 2016-06-30 NOTE — Telephone Encounter (Signed)
Forms from [facility] to be signed and sent back, second request, date needs to be backdated   In your yellow folder

## 2016-08-10 ENCOUNTER — Emergency Department (HOSPITAL_COMMUNITY): Payer: Medicare Other

## 2016-08-10 ENCOUNTER — Inpatient Hospital Stay (HOSPITAL_COMMUNITY)
Admission: EM | Admit: 2016-08-10 | Discharge: 2016-08-12 | DRG: 190 | Disposition: A | Discharge status: Nursing Home (Includes ICF) | Payer: Medicare Other | Attending: Internal Medicine | Admitting: Internal Medicine

## 2016-08-10 ENCOUNTER — Encounter (HOSPITAL_COMMUNITY): Payer: Self-pay | Admitting: Emergency Medicine

## 2016-08-10 ENCOUNTER — Telehealth: Payer: Self-pay | Admitting: *Deleted

## 2016-08-10 DIAGNOSIS — H3530 Unspecified macular degeneration: Secondary | ICD-10-CM | POA: Diagnosis present

## 2016-08-10 DIAGNOSIS — I10 Essential (primary) hypertension: Secondary | ICD-10-CM | POA: Diagnosis present

## 2016-08-10 DIAGNOSIS — Z87891 Personal history of nicotine dependence: Secondary | ICD-10-CM

## 2016-08-10 DIAGNOSIS — R0902 Hypoxemia: Secondary | ICD-10-CM | POA: Diagnosis present

## 2016-08-10 DIAGNOSIS — J441 Chronic obstructive pulmonary disease with (acute) exacerbation: Secondary | ICD-10-CM | POA: Diagnosis present

## 2016-08-10 DIAGNOSIS — J209 Acute bronchitis, unspecified: Secondary | ICD-10-CM | POA: Diagnosis present

## 2016-08-10 DIAGNOSIS — N400 Enlarged prostate without lower urinary tract symptoms: Secondary | ICD-10-CM | POA: Diagnosis present

## 2016-08-10 DIAGNOSIS — Z66 Do not resuscitate: Secondary | ICD-10-CM | POA: Diagnosis present

## 2016-08-10 DIAGNOSIS — R0602 Shortness of breath: Secondary | ICD-10-CM

## 2016-08-10 DIAGNOSIS — E1140 Type 2 diabetes mellitus with diabetic neuropathy, unspecified: Secondary | ICD-10-CM | POA: Diagnosis present

## 2016-08-10 DIAGNOSIS — J189 Pneumonia, unspecified organism: Secondary | ICD-10-CM | POA: Diagnosis present

## 2016-08-10 DIAGNOSIS — Z7984 Long term (current) use of oral hypoglycemic drugs: Secondary | ICD-10-CM

## 2016-08-10 DIAGNOSIS — J440 Chronic obstructive pulmonary disease with acute lower respiratory infection: Principal | ICD-10-CM | POA: Diagnosis present

## 2016-08-10 DIAGNOSIS — E119 Type 2 diabetes mellitus without complications: Secondary | ICD-10-CM

## 2016-08-10 DIAGNOSIS — R05 Cough: Secondary | ICD-10-CM | POA: Diagnosis not present

## 2016-08-10 DIAGNOSIS — H9190 Unspecified hearing loss, unspecified ear: Secondary | ICD-10-CM | POA: Diagnosis present

## 2016-08-10 DIAGNOSIS — E782 Mixed hyperlipidemia: Secondary | ICD-10-CM | POA: Diagnosis present

## 2016-08-10 DIAGNOSIS — Z8673 Personal history of transient ischemic attack (TIA), and cerebral infarction without residual deficits: Secondary | ICD-10-CM

## 2016-08-10 DIAGNOSIS — R778 Other specified abnormalities of plasma proteins: Secondary | ICD-10-CM

## 2016-08-10 DIAGNOSIS — N189 Chronic kidney disease, unspecified: Secondary | ICD-10-CM | POA: Diagnosis not present

## 2016-08-10 DIAGNOSIS — Z23 Encounter for immunization: Secondary | ICD-10-CM | POA: Diagnosis not present

## 2016-08-10 DIAGNOSIS — T380X5A Adverse effect of glucocorticoids and synthetic analogues, initial encounter: Secondary | ICD-10-CM | POA: Diagnosis present

## 2016-08-10 DIAGNOSIS — I129 Hypertensive chronic kidney disease with stage 1 through stage 4 chronic kidney disease, or unspecified chronic kidney disease: Secondary | ICD-10-CM | POA: Diagnosis not present

## 2016-08-10 DIAGNOSIS — R7989 Other specified abnormal findings of blood chemistry: Secondary | ICD-10-CM

## 2016-08-10 LAB — COMPREHENSIVE METABOLIC PANEL
ALBUMIN: 4.6 g/dL (ref 3.5–5.0)
ALK PHOS: 72 U/L (ref 38–126)
ALT: 16 U/L — ABNORMAL LOW (ref 17–63)
ANION GAP: 7 (ref 5–15)
AST: 19 U/L (ref 15–41)
BILIRUBIN TOTAL: 0.5 mg/dL (ref 0.3–1.2)
BUN: 23 mg/dL — AB (ref 6–20)
CALCIUM: 9.3 mg/dL (ref 8.9–10.3)
CO2: 27 mmol/L (ref 22–32)
Chloride: 103 mmol/L (ref 101–111)
Creatinine, Ser: 1.18 mg/dL (ref 0.61–1.24)
GFR calc Af Amer: 57 mL/min — ABNORMAL LOW (ref >60)
GFR, EST NON AFRICAN AMERICAN: 49 mL/min — AB (ref >60)
GLUCOSE: 95 mg/dL (ref 65–99)
Potassium: 4.8 mmol/L (ref 3.5–5.1)
Sodium: 137 mmol/L (ref 135–145)
TOTAL PROTEIN: 7.1 g/dL (ref 6.5–8.1)

## 2016-08-10 LAB — GLUCOSE, CAPILLARY: GLUCOSE-CAPILLARY: 320 mg/dL — AB (ref 65–99)

## 2016-08-10 LAB — URINALYSIS, ROUTINE W REFLEX MICROSCOPIC
Bilirubin Urine: NEGATIVE
GLUCOSE, UA: NEGATIVE mg/dL
HGB URINE DIPSTICK: NEGATIVE
Ketones, ur: NEGATIVE mg/dL
Leukocytes, UA: NEGATIVE
Nitrite: NEGATIVE
Protein, ur: NEGATIVE mg/dL
SPECIFIC GRAVITY, URINE: 1.01 (ref 1.005–1.030)
pH: 7 (ref 5.0–8.0)

## 2016-08-10 LAB — BLOOD GAS, ARTERIAL
ACID-BASE DEFICIT: 2.5 mmol/L — AB (ref 0.0–2.0)
Bicarbonate: 22.4 mmol/L (ref 20.0–28.0)
O2 CONTENT: 2 L/min
O2 SAT: 95.2 %
pCO2 arterial: 37.4 mmHg (ref 32.0–48.0)
pH, Arterial: 7.384 (ref 7.350–7.450)
pO2, Arterial: 78.3 mmHg — ABNORMAL LOW (ref 83.0–108.0)

## 2016-08-10 LAB — CBC WITH DIFFERENTIAL/PLATELET
BASOS ABS: 0.1 10*3/uL (ref 0.0–0.1)
BASOS PCT: 1 %
EOS PCT: 7 %
Eosinophils Absolute: 0.6 10*3/uL (ref 0.0–0.7)
HCT: 42.4 % (ref 39.0–52.0)
Hemoglobin: 14 g/dL (ref 13.0–17.0)
Lymphocytes Relative: 30 %
Lymphs Abs: 2.5 10*3/uL (ref 0.7–4.0)
MCH: 31.7 pg (ref 26.0–34.0)
MCHC: 33 g/dL (ref 30.0–36.0)
MCV: 96.1 fL (ref 78.0–100.0)
MONO ABS: 0.6 10*3/uL (ref 0.1–1.0)
Monocytes Relative: 7 %
Neutro Abs: 4.7 10*3/uL (ref 1.7–7.7)
Neutrophils Relative %: 55 %
Platelets: 254 10*3/uL (ref 150–400)
RBC: 4.41 MIL/uL (ref 4.22–5.81)
RDW: 14.2 % (ref 11.5–15.5)
WBC: 8.5 10*3/uL (ref 4.0–10.5)

## 2016-08-10 LAB — TROPONIN I: Troponin I: 0.03 ng/mL (ref <0.03)

## 2016-08-10 LAB — I-STAT CG4 LACTIC ACID, ED: LACTIC ACID, VENOUS: 0.9 mmol/L (ref 0.5–1.9)

## 2016-08-10 LAB — MRSA PCR SCREENING: MRSA BY PCR: NEGATIVE

## 2016-08-10 LAB — BRAIN NATRIURETIC PEPTIDE: B Natriuretic Peptide: 95 pg/mL (ref 0.0–100.0)

## 2016-08-10 MED ORDER — LACTULOSE 10 GM/15ML PO SOLN
10.0000 g | Freq: Every day | ORAL | Status: DC
Start: 2016-08-11 — End: 2016-08-12
  Administered 2016-08-11 – 2016-08-12 (×2): 10 g via ORAL
  Filled 2016-08-10 (×2): qty 30

## 2016-08-10 MED ORDER — FINASTERIDE 5 MG PO TABS
5.0000 mg | ORAL_TABLET | Freq: Every day | ORAL | Status: DC
  Administered 2016-08-11 – 2016-08-12 (×2): 5 mg via ORAL
  Filled 2016-08-10 (×3): qty 1

## 2016-08-10 MED ORDER — ALBUTEROL (5 MG/ML) CONTINUOUS INHALATION SOLN
10.0000 mg/h | INHALATION_SOLUTION | Freq: Once | RESPIRATORY_TRACT | Status: AC
Start: 2016-08-10 — End: 2016-08-10
  Administered 2016-08-10: 10 mg/h via RESPIRATORY_TRACT
  Filled 2016-08-10: qty 20

## 2016-08-10 MED ORDER — METHYLPREDNISOLONE SODIUM SUCC 125 MG IJ SOLR
125.0000 mg | Freq: Once | INTRAMUSCULAR | Status: AC
  Administered 2016-08-10: 125 mg via INTRAVENOUS
  Filled 2016-08-10: qty 2

## 2016-08-10 MED ORDER — ALBUTEROL SULFATE (2.5 MG/3ML) 0.083% IN NEBU: 2.5000 mg | INHALATION_SOLUTION | RESPIRATORY_TRACT | Status: DC | PRN

## 2016-08-10 MED ORDER — GLIPIZIDE ER 2.5 MG PO TB24
2.5000 mg | ORAL_TABLET | Freq: Every day | ORAL | Status: DC
  Administered 2016-08-11 – 2016-08-12 (×2): 2.5 mg via ORAL
  Filled 2016-08-10 (×3): qty 1

## 2016-08-10 MED ORDER — ALLOPURINOL 300 MG PO TABS
300.0000 mg | ORAL_TABLET | Freq: Every day | ORAL | Status: DC
  Administered 2016-08-11 – 2016-08-12 (×2): 300 mg via ORAL
  Filled 2016-08-10 (×2): qty 1

## 2016-08-10 MED ORDER — CEFTRIAXONE SODIUM 1 G IJ SOLR
1.0000 g | Freq: Once | INTRAMUSCULAR | Status: AC
  Administered 2016-08-10: 1 g via INTRAVENOUS
  Filled 2016-08-10: qty 10

## 2016-08-10 MED ORDER — TRAZODONE HCL 50 MG PO TABS
50.0000 mg | ORAL_TABLET | Freq: Every day | ORAL | Status: DC
  Administered 2016-08-10 – 2016-08-11 (×2): 50 mg via ORAL
  Filled 2016-08-10 (×2): qty 1

## 2016-08-10 MED ORDER — SENNA 8.6 MG PO TABS
2.0000 | ORAL_TABLET | Freq: Every day | ORAL | Status: DC
  Administered 2016-08-10 – 2016-08-11 (×2): 17.2 mg via ORAL
  Filled 2016-08-10 (×2): qty 2

## 2016-08-10 MED ORDER — PANTOPRAZOLE SODIUM 40 MG PO TBEC
40.0000 mg | DELAYED_RELEASE_TABLET | Freq: Every day | ORAL | Status: DC
  Administered 2016-08-11 – 2016-08-12 (×2): 40 mg via ORAL
  Filled 2016-08-10 (×2): qty 1

## 2016-08-10 MED ORDER — GUAIFENESIN ER 600 MG PO TB12
600.0000 mg | ORAL_TABLET | Freq: Two times a day (BID) | ORAL | Status: DC
  Administered 2016-08-10 – 2016-08-12 (×4): 600 mg via ORAL
  Filled 2016-08-10 (×4): qty 1

## 2016-08-10 MED ORDER — OCUVITE-LUTEIN PO CAPS
1.0000 | ORAL_CAPSULE | Freq: Every day | ORAL | Status: DC
  Administered 2016-08-11 – 2016-08-12 (×2): 1 via ORAL
  Filled 2016-08-10 (×2): qty 1

## 2016-08-10 MED ORDER — PRAVASTATIN SODIUM 40 MG PO TABS
40.0000 mg | ORAL_TABLET | Freq: Every day | ORAL | Status: DC
  Administered 2016-08-10 – 2016-08-11 (×2): 40 mg via ORAL
  Filled 2016-08-10 (×2): qty 1

## 2016-08-10 MED ORDER — ENALAPRIL MALEATE 5 MG PO TABS
10.0000 mg | ORAL_TABLET | Freq: Every day | ORAL | Status: DC
  Administered 2016-08-11 – 2016-08-12 (×2): 10 mg via ORAL
  Filled 2016-08-10 (×3): qty 2
  Filled 2016-08-10: qty 1
  Filled 2016-08-10: qty 2

## 2016-08-10 MED ORDER — BISACODYL 10 MG RE SUPP
10.0000 mg | Freq: Every day | RECTAL | Status: DC | PRN
Start: 2016-08-10 — End: 2016-08-12

## 2016-08-10 MED ORDER — METFORMIN HCL 500 MG PO TABS
500.0000 mg | ORAL_TABLET | Freq: Two times a day (BID) | ORAL | Status: DC
  Administered 2016-08-10 – 2016-08-12 (×4): 500 mg via ORAL
  Filled 2016-08-10 (×4): qty 1

## 2016-08-10 MED ORDER — TRAMADOL HCL 50 MG PO TABS: 50.0000 mg | ORAL_TABLET | Freq: Four times a day (QID) | ORAL | Status: DC | PRN

## 2016-08-10 MED ORDER — TRAZODONE HCL 50 MG PO TABS: 50.0000 mg | ORAL_TABLET | Freq: Every evening | ORAL | Status: DC | PRN

## 2016-08-10 MED ORDER — ALPRAZOLAM 0.5 MG PO TABS: 0.2500 mg | ORAL_TABLET | Freq: Every evening | ORAL | Status: DC | PRN

## 2016-08-10 MED ORDER — AMLODIPINE BESYLATE 5 MG PO TABS
5.0000 mg | ORAL_TABLET | Freq: Every day | ORAL | Status: DC
  Administered 2016-08-11 – 2016-08-12 (×2): 5 mg via ORAL
  Filled 2016-08-10 (×2): qty 1

## 2016-08-10 MED ORDER — METHYLPREDNISOLONE SODIUM SUCC 40 MG IJ SOLR
40.0000 mg | Freq: Three times a day (TID) | INTRAMUSCULAR | Status: DC
  Administered 2016-08-11 – 2016-08-12 (×5): 40 mg via INTRAVENOUS
  Filled 2016-08-10 (×5): qty 1

## 2016-08-10 MED ORDER — AZITHROMYCIN 250 MG PO TABS
500.0000 mg | ORAL_TABLET | Freq: Once | ORAL | Status: AC
  Administered 2016-08-10: 500 mg via ORAL
  Filled 2016-08-10: qty 2

## 2016-08-10 MED ORDER — HYDROXYZINE HCL 25 MG PO TABS
25.0000 mg | ORAL_TABLET | Freq: Every day | ORAL | Status: DC
  Administered 2016-08-10 – 2016-08-11 (×2): 25 mg via ORAL
  Filled 2016-08-10 (×2): qty 1

## 2016-08-10 MED ORDER — ACETAMINOPHEN 650 MG RE SUPP: 650.0000 mg | Freq: Four times a day (QID) | RECTAL | Status: DC | PRN

## 2016-08-10 MED ORDER — SODIUM CHLORIDE 0.9% FLUSH: 3.0000 mL | INTRAVENOUS | Status: DC | PRN

## 2016-08-10 MED ORDER — SODIUM CHLORIDE 0.9% FLUSH
3.0000 mL | Freq: Two times a day (BID) | INTRAVENOUS | Status: DC
  Administered 2016-08-10 – 2016-08-12 (×3): 3 mL via INTRAVENOUS

## 2016-08-10 MED ORDER — AZITHROMYCIN 250 MG PO TABS
500.0000 mg | ORAL_TABLET | Freq: Every day | ORAL | Status: DC
  Administered 2016-08-11 – 2016-08-12 (×2): 500 mg via ORAL
  Filled 2016-08-10 (×2): qty 2

## 2016-08-10 MED ORDER — ACETAMINOPHEN 325 MG PO TABS: 650.0000 mg | ORAL_TABLET | Freq: Four times a day (QID) | ORAL | Status: DC | PRN

## 2016-08-10 MED ORDER — SODIUM CHLORIDE 0.9 % IV SOLN: 250.0000 mL | INTRAVENOUS | Status: DC | PRN

## 2016-08-10 MED ORDER — IPRATROPIUM-ALBUTEROL 0.5-2.5 (3) MG/3ML IN SOLN
3.0000 mL | Freq: Four times a day (QID) | RESPIRATORY_TRACT | Status: DC
  Administered 2016-08-10: 3 mL via RESPIRATORY_TRACT
  Filled 2016-08-10 (×2): qty 3

## 2016-08-10 MED ORDER — ONDANSETRON HCL 4 MG PO TABS: 4.0000 mg | ORAL_TABLET | Freq: Four times a day (QID) | ORAL | Status: DC | PRN

## 2016-08-10 MED ORDER — SODIUM CHLORIDE 0.9% FLUSH
3.0000 mL | Freq: Two times a day (BID) | INTRAVENOUS | Status: DC
  Administered 2016-08-11 – 2016-08-12 (×2): 3 mL via INTRAVENOUS

## 2016-08-10 MED ORDER — ONDANSETRON HCL 4 MG/2ML IJ SOLN: 4.0000 mg | Freq: Four times a day (QID) | INTRAMUSCULAR | Status: DC | PRN

## 2016-08-10 MED ORDER — INSULIN ASPART 100 UNIT/ML ~~LOC~~ SOLN
0.0000 [IU] | Freq: Three times a day (TID) | SUBCUTANEOUS | Status: DC
  Administered 2016-08-11: 3 [IU] via SUBCUTANEOUS
  Administered 2016-08-11: 5 [IU] via SUBCUTANEOUS
  Administered 2016-08-11 – 2016-08-12 (×3): 3 [IU] via SUBCUTANEOUS

## 2016-08-10 MED ORDER — HEPARIN SODIUM (PORCINE) 5000 UNIT/ML IJ SOLN
5000.0000 [IU] | Freq: Three times a day (TID) | INTRAMUSCULAR | Status: DC
  Administered 2016-08-10 – 2016-08-12 (×6): 5000 [IU] via SUBCUTANEOUS
  Filled 2016-08-10 (×6): qty 1

## 2016-08-10 MED ORDER — ACETAMINOPHEN 500 MG PO TABS
1000.0000 mg | ORAL_TABLET | Freq: Two times a day (BID) | ORAL | Status: DC
  Administered 2016-08-10 – 2016-08-12 (×4): 1000 mg via ORAL
  Filled 2016-08-10 (×4): qty 2

## 2016-08-10 NOTE — ED Triage Notes (Signed)
Pt reports being sent by Dr. Wolfgang Phoenix for wheezing/sob, denies cp.  Pt has also has a dry cough.  Pt alert and oriented, in no acute distress at this time.

## 2016-08-10 NOTE — ED Notes (Signed)
Pt refused wheelchair.  Pt walked back to room using walker.

## 2016-08-10 NOTE — ED Notes (Signed)
Pulse Ox 66 while ambulating

## 2016-08-10 NOTE — H&P (Signed)
Patient Demographics:    Christian Thomas, is a 82 y.o. male  MRN: [id number]   DOB - 1915-08-01  Admit Date - 08/10/2016  Outpatient Primary MD for the patient is Mickie Hillier, MD   Assessment & Plan:    Principal Problem:   COPD exacerbation ([place]) with hypoxia and wheezy bronchitis Active Problems:   Essential hypertension, benign   DM2 (diabetes mellitus, type 2) ([place])   Hypoxia    1)Acute COPD exacerbation with hypoxia and wheezy bronchitis- patient is a reformed smoker, URI symptoms for over 2 weeks now, in the ED O2 sats dropped down to the 60s with minimum activity, audible wheezing from a distance. No definite pneumonia clinically and radiologically,  treat empirically with IV Solu-Medrol 40 mg every 8 hours, give mucolytics, azithromycin and bronchodilators as ordered, supplemental oxygen as ordered. Prior to admission patient was not on home O2, he would need to be reevaluated within 24 hours of discharge to see if he qualifies for home O2.   2)Social/Ethics- patient is from assisted living facility, despite his advanced age he is fairly independent, very hard of hearing however. Advanced directives discussed with patient and his daughter at bedside, He is a DNR/DNI.  3)DM- anticipate worsening glycemic control due to steroids as above in #1, continue glipizide and metformin, Use Novolog/Humalog Sliding scale insulin with Accu-Cheks/Fingersticks as ordered   4)HTN- stable, continue amlodipine 5 mg daily and enalapril 10 mg daily   With History of - Reviewed by me  Past Medical History:  Diagnosis Date  . Benign essential tremor   . BPH (benign prostatic hypertrophy)   . Cerebellar stroke syndrome   . CHF (congestive heart failure) ([place])   . Chronic constipation   . Clostridium difficile colitis  12/2008  . DDD (degenerative disc disease), lumbar   . Diabetic neuropathy ([place])   . Diverticulosis   . Dysrhythmia   . Essential hypertension, benign   . Gout   . Macular degeneration   . Mixed hyperlipidemia   . Stroke ([place])   . Type 2 diabetes mellitus ([place])   . Venous insufficiency       Past Surgical History:  Procedure Laterality Date  . APPENDECTOMY    . COLONOSCOPY     NL TI, pan-TICS, sml IH, CDIFF COLITIS  . COLONOSCOPY N/A 12/15/2013   Procedure: Colonoscopy and possible EGD;  Surgeon: Rogene Houston, MD;  Location: [facility];  Service: Endoscopy;  Laterality: N/A;  . HEMORRHOID SURGERY    . HERNIA REPAIR    . HIP ARTHROPLASTY  11/07/2012   Procedure: ARTHROPLASTY BIPOLAR HIP;  Surgeon: Mauri Pole, MD;  Location: [facility] OR[facility];  Service: Orthopedics;  Laterality: Right;  . SIGMOIDOSCOPY     Internal hemorrhoids      No chief complaint on file.     HPI:    Christian Thomas  is a 82 y.o. male, who is a  reformed smoker with past  medical history relevant for diabetes and  Hypertension  who presents with c/o URI symptoms for over 2 weeks now, in the ED O2 sats dropped down to the 60s with minimum activity, pt has audible wheezing from a distance. No definite pneumonia clinically and radiologically. No chest pains, no palpitations, patient does have chronic lower extremity edema, but no pleuritic symptoms or leg pains. Patient's daughter is at bedside, questions answered. Patient has had some low-grade fevers. No vomiting no diarrhea. No sick contacts at the assisted living facility. Patient's respiratory symptoms have worsened over the last several days despite bronchodilator use    Review of systems:    In addition to the HPI above,   A full 12 point Review of 10 Systems was done, except as stated above, all other Review of 10 Systems were negative.    Social History:  Reviewed by me    Social History  Substance Use Topics  . Smoking status: Former Smoker     Packs/day: 0.50    Years: 54.00    Types: Cigarettes    Quit date: 11/19/1992  . Smokeless tobacco: Never Used  . Alcohol use Yes     Comment: Rare       Family History :  Reviewed by me    Family History  Problem Relation Age of Onset  . Cancer Mother     Breast      Home Medications:   Prior to Admission medications   Medication Sig Start Date End Date Taking? Authorizing Provider  acetaminophen (TYLENOL) 325 MG tablet Take 650 mg by mouth every 4 (four) hours as needed.   Yes Historical Provider, MD  albuterol (PROVENTIL HFA;VENTOLIN HFA) 108 (90 Base) MCG/ACT inhaler Inhale 2 puffs into the lungs every 6 (six) hours as needed for wheezing or shortness of breath. 06/07/16  Yes Mikey Kirschner, MD  albuterol (PROVENTIL) (2.5 MG/3ML) 0.083% nebulizer solution Take 3 mLs (2.5 mg total) by nebulization every 6 (six) hours as needed for wheezing or shortness of breath. 04/26/16  Yes Mikey Kirschner, MD  allopurinol (ZYLOPRIM) 300 MG tablet TAKE 1 TABLET BY MOUTH ONCE DAILY FOR GOUT. 06/07/16  Yes Mikey Kirschner, MD  ALPRAZolam Duanne Moron) 0.25 MG tablet Take 1 tablet at bedtime as needed for anxiety and itching. 04/26/16  Yes Mikey Kirschner, MD  amLODipine (NORVASC) 5 MG tablet Take 1 tablet (5 mg total) by mouth daily. 06/07/16  Yes Mikey Kirschner, MD  capsaicin (ZOSTRIX) 0.025 % cream Apply 1 application topically 2 (two) times daily as needed (apply to left ankle and feet as needed).   Yes Historical Provider, MD  Diphenhyd-Hydrocort-Nystatin (FIRST-DUKES MOUTHWASH) SUSP One tablespoon swish and gargle qid  prn Patient taking differently: Take 15 mLs by mouth 4 (four) times daily as needed. One tablespoon swish and gargle as needed 12/03/15  Yes Mikey Kirschner, MD  diphenhydrAMINE (BENADRYL) 25 MG tablet Take 25 mg by mouth every 6 (six) hours as needed for itching.   Yes Historical Provider, MD  enalapril (VASOTEC) 10 MG tablet TAKE (1) TABLET BY MOUTH TWICE DAILY. 06/07/16  Yes  Mikey Kirschner, MD  finasteride (PROSCAR) 5 MG tablet Take 1 tablet (5 mg total) by mouth daily. 06/07/16  Yes Mikey Kirschner, MD  hydrOXYzine (ATARAX/VISTARIL) 25 MG tablet TAKE ONE TABLET BY MOUTH AT BEDTIME AS NEEDED ITCHING/SLEEP. Patient taking differently: TAKE ONE TABLET BY MOUTH AT BEDTIME 01/02/15  Yes Mikey Kirschner, MD  INSTA-GLUCOSE 77.4 % GEL  GIVE 1 TUBE BY MOUTH IF B/S <60; WAIT 15 MINUTES THEN RECHECK. 02/10/15  Yes Mikey Kirschner, MD  lactulose (CHRONULAC) 10 GM/15ML solution TAKE 2 TABLESPOONFULS (30ML) BY MOUTH ONCE DAILY. 08/21/14  Yes Mikey Kirschner, MD  loratadine (CLARITIN) 10 MG tablet TAKE 1 TABLET BY MOUTH ONCE DAILY FOR ALLERGIC RHINITIS. 09/02/14  Yes Kathyrn Drown, MD  lovastatin (MEVACOR) 40 MG tablet TAKE (1) TABLET BY MOUTH AT BEDTIME FOR CHOLESTEROL. 06/07/16  Yes Mikey Kirschner, MD  meclizine (ANTIVERT) 25 MG tablet Take 25 mg by mouth every 6 (six) hours as needed for dizziness.   Yes Historical Provider, MD  Menthol, Topical Analgesic, (BIOFREEZE EX) Apply 1 application topically 4 (four) times daily as needed (for pain).   Yes Historical Provider, MD  metFORMIN (GLUCOPHAGE) 500 MG tablet Take 1 tablet (500 mg total) by mouth 2 (two) times daily. 06/07/16  Yes Mikey Kirschner, MD  Multiple Vitamins-Minerals (ICAPS LUTEIN & ZEAXANTHIN) TBEC Take 2 tablets by mouth daily.   Yes Historical Provider, MD  omeprazole (PRILOSEC) 20 MG capsule Take 1 capsule (20 mg total) by mouth 2 (two) times daily before a meal. Patient taking differently: Take 40 mg by mouth every morning.  06/07/16  Yes Mikey Kirschner, MD  ondansetron (ZOFRAN ODT) 4 MG disintegrating tablet Take 1 tablet (4 mg total) by mouth every 8 (eight) hours as needed for nausea or vomiting. 01/07/14  Yes Mikey Kirschner, MD  Q-PAP 500 MG tablet TAKE 2 TABLETS BY MOUTH TWICE DAILY. 02/05/15  Yes Mikey Kirschner, MD  QC ANTACID/ANTI-GAS 332 298 6748 MG/5ML suspension TAKE 2 TABLESPOONFULS (61ml) BY MOUTH  EACH EVENING. 02/10/15  Yes Mikey Kirschner, MD  ranitidine (ZANTAC) 300 MG tablet TAKE 1 TABLET BY MOUTH ONCE DAILY. 10/28/14  Yes Mikey Kirschner, MD  senna (SENOKOT) 8.6 MG TABS tablet TAKE 2 TABLETS BY MOUTH AT BEDTIME. 12/26/14  Yes Mikey Kirschner, MD  SORE THROAT LIQD USE 5 SPRAYS IN THROAT EVERY 2 HOURS AS NEEDED FOR SORE THROAT. 01/08/15  Yes Mikey Kirschner, MD  Spacer/Aero-Holding Chambers (AEROCHAMBER MINI CHAMBER) DEVI Use as directed 09/01/15  Yes Mikey Kirschner, MD  traMADol (ULTRAM) 50 MG tablet TAKE 1 TABLET BY MOUTH TWICE DAILY AS NEEDED FOR PAIN. 03/29/16  Yes Mikey Kirschner, MD  traZODone (DESYREL) 50 MG tablet Take 1 tablet (50 mg total) by mouth at bedtime as needed for sleep. 05/13/15  Yes Mikey Kirschner, MD  amoxicillin (AMOXIL) 500 MG capsule Take 1 capsule (500 mg total) by mouth 3 (three) times daily. Patient not taking: Reported on 08/10/2016 06/07/16   Mikey Kirschner, MD  glipiZIDE (GLUCOTROL XL) 2.5 MG 24 hr tablet Take 1 tablet (2.5 mg total) by mouth daily with breakfast. Patient not taking: Reported on 08/10/2016 06/07/16   Mikey Kirschner, MD     Allergies:     Allergies  Allergen Reactions  . Morphine And Related   . Other     Opioids      Physical Exam:   Vitals  Blood pressure 172/83, pulse 85, temperature 97.5 F (36.4 C), temperature source Oral, resp. rate 21, height 6\' 2"  (1.88 m), weight 83.9 kg (185 lb), SpO2 97 %.  Physical Examination: General appearance - alert, Overall well appearing, and in no acute respiratory distress  Mental status - alert, oriented to person, place, and time,  Eyes - sclera anicteric Ears-very hard of hearing, uses hearing aids Neck - supple, no  JVD elevation , Chest - scattered wheezes bilaterally , air  movement is diminished, but symmetrical  Heart - S1 and S2 normal,  Abdomen - soft, nontender, nondistended, no masses or organomegaly Neurological - screening mental status exam normal, neck supple  without rigidity, cranial nerves II through XII intact, DTR's normal and symmetric Extremities - 1+ pedal edema noted (not new), intact peripheral pulses  Skin - warm, dry    Data Review:    CBC  Recent Labs Lab 08/10/16 1355  WBC 8.5  HGB 14.0  HCT 42.4  PLT 254  MCV 96.1  MCH 31.7  MCHC 33.0  RDW 14.2  LYMPHSABS 2.5  MONOABS 0.6  EOSABS 0.6  BASOSABS 0.1   ------------------------------------------------------------------------------------------------------------------  Chemistries   Recent Labs Lab 08/10/16 1355  NA 137  K 4.8  CL 103  CO2 27  GLUCOSE 95  BUN 23*  CREATININE 1.18  CALCIUM 9.3  AST 19  ALT 16*  ALKPHOS 72  BILITOT 0.5   ------------------------------------------------------------------------------------------------------------------ estimated creatinine clearance is 39.7 mL/min (by C-G formula based on SCr of 1.18 mg/dL). ------------------------------------------------------------------------------------------------------------------ No results for input(s): TSH, T4TOTAL, T3FREE, THYROIDAB in the last 72 hours.  Invalid input(s): FREET3   Coagulation profile No results for input(s): INR, PROTIME in the last 168 hours. ------------------------------------------------------------------------------------------------------------------- No results for input(s): DDIMER in the last 72 hours. -------------------------------------------------------------------------------------------------------------------  Cardiac Enzymes  Recent Labs Lab 08/10/16 1355  TROPONINI 0.03*   ------------------------------------------------------------------------------------------------------------------    Component Value Date/Time   BNP 95.0 08/10/2016 1355     ---------------------------------------------------------------------------------------------------------------  Urinalysis    Component Value Date/Time   COLORURINE YELLOW 08/10/2016  1348   APPEARANCEUR CLEAR 08/10/2016 1348   LABSPEC 1.010 08/10/2016 1348   PHURINE 7.0 08/10/2016 1348   GLUCOSEU NEGATIVE 08/10/2016 1348   HGBUR NEGATIVE 08/10/2016 1348   BILIRUBINUR NEGATIVE 08/10/2016 1348   KETONESUR NEGATIVE 08/10/2016 1348   PROTEINUR NEGATIVE 08/10/2016 1348   UROBILINOGEN 0.2 01/07/2014 0956   NITRITE NEGATIVE 08/10/2016 1348   LEUKOCYTESUR NEGATIVE 08/10/2016 1348    ----------------------------------------------------------------------------------------------------------------   Imaging Results:    Dg Chest Port 1 View  Result Date: 08/10/2016 CLINICAL DATA:  Cough and wheezing.  Shortness of breath. EXAM: PORTABLE CHEST 1 VIEW COMPARISON:  Chest radiograph 01/07/2014 FINDINGS: Cardiomediastinal silhouette remains enlarged. There is atherosclerotic calcification within the aortic arch. There is shallow lung inflation. New there is poor aeration of the left lung base. No overt pulmonary edema. No pneumothorax or sizable pleural effusion. IMPRESSION: 1. Shallow inspiration with left basilar opacities, likely atelectasis. 2. Cardiomegaly and aortic atherosclerosis without overt pulmonary edema. Electronically Signed   By: Ulyses Jarred M.D.   On: 08/10/2016 15:24    Radiological Exams on Admission: Dg Chest Port 1 View  Result Date: 08/10/2016 CLINICAL DATA:  Cough and wheezing.  Shortness of breath. EXAM: PORTABLE CHEST 1 VIEW COMPARISON:  Chest radiograph 01/07/2014 FINDINGS: Cardiomediastinal silhouette remains enlarged. There is atherosclerotic calcification within the aortic arch. There is shallow lung inflation. New there is poor aeration of the left lung base. No overt pulmonary edema. No pneumothorax or sizable pleural effusion. IMPRESSION: 1. Shallow inspiration with left basilar opacities, likely atelectasis. 2. Cardiomegaly and aortic atherosclerosis without overt pulmonary edema. Electronically Signed   By: Ulyses Jarred M.D.   On: 08/10/2016 15:24     DVT Prophylaxis -SCD  Heparin subcutaneous AM Labs Ordered, also please review Full Orders  Family Communication: Admission, patients condition and plan of care including tests being ordered have been discussed with  the patient and daughter at bedside who indicate understanding and agree with the plan   Code Status - DNR  Likely DC to  Back to ALF   Condition   fair  Tyberius Ryner M.D on 08/10/2016 at 5:43 PM   Between 7am to 7pm - Pager - [phone]  After 7pm go to [email or web address] - password [email or web address]  [facility] - Office  [phone]  Dragon dictation system was used to create this note, attempts have been made to correct errors, however presence of uncorrected errors is not a reflection quality of care provided.

## 2016-08-10 NOTE — Telephone Encounter (Signed)
[facility] assisted [facility] called to report patient with c/o cough, congestion, low grade fever and wheezing with shortness of breath. Consult with Dr Richardson Landry. Dr Richardson Landry advises patient to go directly to ER for evaluation and treatment. [facility] verbalized understanding

## 2016-08-10 NOTE — ED Notes (Signed)
MD at bedside. 

## 2016-08-10 NOTE — ED Provider Notes (Signed)
[facility] DEPT Provider Note   CSN: [id number] Arrival date & time: 08/10/16  1322     History   Chief Complaint No chief complaint on file.   HPI Christian Thomas is a 82 y.o. male.  Pt presents to the ED today because of wheezing and sob.  The pt has had a cough, but is not bringing much up.  The pt has been trying his nebulizers, but he is not improving.  The pt denies cp or f/c.      Past Medical History:  Diagnosis Date  . Benign essential tremor   . BPH (benign prostatic hypertrophy)   . Cerebellar stroke syndrome   . CHF (congestive heart failure) ([place])   . Chronic constipation   . Clostridium difficile colitis 12/2008  . DDD (degenerative disc disease), lumbar   . Diabetic neuropathy ([place])   . Diverticulosis   . Dysrhythmia   . Essential hypertension, benign   . Gout   . Macular degeneration   . Mixed hyperlipidemia   . Stroke ([place])   . Type 2 diabetes mellitus ([place])   . Venous insufficiency     Patient Active Problem List   Diagnosis Date Noted  . Hypoxia 08/10/2016  . Asthma with acute exacerbation 03/03/2016  . Degenerative arthritis of knee, bilateral 12/10/2014  . GI bleeding 12/14/2013  . Accelerated hypertension 12/14/2013  . Anemia due to blood loss, acute 12/14/2013  . Dizziness and giddiness 08/20/2013  . Cerebellar stroke syndrome 08/20/2013  . Hereditary and idiopathic peripheral neuropathy 06/15/2013  . Prostate hypertrophy 06/15/2013  . DM2 (diabetes mellitus, type 2) ([place]) 11/05/2012  . Femoral neck fracture ([place]) 11/05/2012  . Constipation 11/05/2012  . Syncope 07/07/2011  . Abnormal ECG 07/07/2011  . Essential hypertension, benign 07/07/2011    Past Surgical History:  Procedure Laterality Date  . APPENDECTOMY    . COLONOSCOPY     NL TI, pan-TICS, sml IH, CDIFF COLITIS  . COLONOSCOPY N/A 12/15/2013   Procedure: Colonoscopy and possible EGD;  Surgeon: Rogene Houston, MD;  Location: [facility]ITE;  Service: Endoscopy;   Laterality: N/A;  . HEMORRHOID SURGERY    . HERNIA REPAIR    . HIP ARTHROPLASTY  11/07/2012   Procedure: ARTHROPLASTY BIPOLAR HIP;  Surgeon: Mauri Pole, MD;  Location: [facility] ORS;  Service: Orthopedics;  Laterality: Right;  . SIGMOIDOSCOPY     Internal hemorrhoids       Home Medications    Prior to Admission medications   Medication Sig Start Date End Date Taking? Authorizing Provider  acetaminophen (TYLENOL) 325 MG tablet Take 650 mg by mouth every 4 (four) hours as needed.   Yes Historical Provider, MD  albuterol (PROVENTIL HFA;VENTOLIN HFA) 108 (90 Base) MCG/ACT inhaler Inhale 2 puffs into the lungs every 6 (six) hours as needed for wheezing or shortness of breath. 06/07/16  Yes Mikey Kirschner, MD  albuterol (PROVENTIL) (2.5 MG/3ML) 0.083% nebulizer solution Take 3 mLs (2.5 mg total) by nebulization every 6 (six) hours as needed for wheezing or shortness of breath. 04/26/16  Yes Mikey Kirschner, MD  allopurinol (ZYLOPRIM) 300 MG tablet TAKE 1 TABLET BY MOUTH ONCE DAILY FOR GOUT. 06/07/16  Yes Mikey Kirschner, MD  ALPRAZolam Duanne Moron) 0.25 MG tablet Take 1 tablet at bedtime as needed for anxiety and itching. 04/26/16  Yes Mikey Kirschner, MD  amLODipine (NORVASC) 5 MG tablet Take 1 tablet (5 mg total) by mouth daily. 06/07/16  Yes Mikey Kirschner, MD  capsaicin (ZOSTRIX) 0.025 % cream Apply 1 application topically 2 (two) times daily as needed (apply to left ankle and feet as needed).   Yes Historical Provider, MD  Diphenhyd-Hydrocort-Nystatin (FIRST-DUKES MOUTHWASH) SUSP One tablespoon swish and gargle qid  prn Patient taking differently: Take 15 mLs by mouth 4 (four) times daily as needed. One tablespoon swish and gargle as needed 12/03/15  Yes Mikey Kirschner, MD  diphenhydrAMINE (BENADRYL) 25 MG tablet Take 25 mg by mouth every 6 (six) hours as needed for itching.   Yes Historical Provider, MD  enalapril (VASOTEC) 10 MG tablet TAKE (1) TABLET BY MOUTH TWICE DAILY. 06/07/16  Yes Mikey Kirschner, MD  finasteride (PROSCAR) 5 MG tablet Take 1 tablet (5 mg total) by mouth daily. 06/07/16  Yes Mikey Kirschner, MD  hydrOXYzine (ATARAX/VISTARIL) 25 MG tablet TAKE ONE TABLET BY MOUTH AT BEDTIME AS NEEDED ITCHING/SLEEP. Patient taking differently: TAKE ONE TABLET BY MOUTH AT BEDTIME 01/02/15  Yes Mikey Kirschner, MD  INSTA-GLUCOSE 77.4 % GEL GIVE 1 TUBE BY MOUTH IF B/S <60; WAIT 15 MINUTES THEN RECHECK. 02/10/15  Yes Mikey Kirschner, MD  lactulose (CHRONULAC) 10 GM/15ML solution TAKE 2 TABLESPOONFULS (30ML) BY MOUTH ONCE DAILY. 08/21/14  Yes Mikey Kirschner, MD  loratadine (CLARITIN) 10 MG tablet TAKE 1 TABLET BY MOUTH ONCE DAILY FOR ALLERGIC RHINITIS. 09/02/14  Yes Kathyrn Drown, MD  lovastatin (MEVACOR) 40 MG tablet TAKE (1) TABLET BY MOUTH AT BEDTIME FOR CHOLESTEROL. 06/07/16  Yes Mikey Kirschner, MD  meclizine (ANTIVERT) 25 MG tablet Take 25 mg by mouth every 6 (six) hours as needed for dizziness.   Yes Historical Provider, MD  Menthol, Topical Analgesic, (BIOFREEZE EX) Apply 1 application topically 4 (four) times daily as needed (for pain).   Yes Historical Provider, MD  metFORMIN (GLUCOPHAGE) 500 MG tablet Take 1 tablet (500 mg total) by mouth 2 (two) times daily. 06/07/16  Yes Mikey Kirschner, MD  Multiple Vitamins-Minerals (ICAPS LUTEIN & ZEAXANTHIN) TBEC Take 2 tablets by mouth daily.   Yes Historical Provider, MD  omeprazole (PRILOSEC) 20 MG capsule Take 1 capsule (20 mg total) by mouth 2 (two) times daily before a meal. Patient taking differently: Take 40 mg by mouth every morning.  06/07/16  Yes Mikey Kirschner, MD  ondansetron (ZOFRAN ODT) 4 MG disintegrating tablet Take 1 tablet (4 mg total) by mouth every 8 (eight) hours as needed for nausea or vomiting. 01/07/14  Yes Mikey Kirschner, MD  Q-PAP 500 MG tablet TAKE 2 TABLETS BY MOUTH TWICE DAILY. 02/05/15  Yes Mikey Kirschner, MD  QC ANTACID/ANTI-GAS [phone] MG/5ML suspension TAKE 2 TABLESPOONFULS (28ml) BY MOUTH EACH  EVENING. 02/10/15  Yes Mikey Kirschner, MD  ranitidine (ZANTAC) 300 MG tablet TAKE 1 TABLET BY MOUTH ONCE DAILY. 10/28/14  Yes Mikey Kirschner, MD  senna (SENOKOT) 8.6 MG TABS tablet TAKE 2 TABLETS BY MOUTH AT BEDTIME. 12/26/14  Yes Mikey Kirschner, MD  SORE THROAT LIQD USE 5 SPRAYS IN THROAT EVERY 2 HOURS AS NEEDED FOR SORE THROAT. 01/08/15  Yes Mikey Kirschner, MD  Spacer/Aero-Holding Chambers (AEROCHAMBER MINI CHAMBER) DEVI Use as directed 09/01/15  Yes Mikey Kirschner, MD  traMADol (ULTRAM) 50 MG tablet TAKE 1 TABLET BY MOUTH TWICE DAILY AS NEEDED FOR PAIN. 03/29/16  Yes Mikey Kirschner, MD  traZODone (DESYREL) 50 MG tablet Take 1 tablet (50 mg total) by mouth at bedtime as needed for sleep. 05/13/15  Yes Grace Bushy  Wolfgang Phoenix, MD  amoxicillin (AMOXIL) 500 MG capsule Take 1 capsule (500 mg total) by mouth 3 (three) times daily. Patient not taking: Reported on 08/10/2016 06/07/16   Mikey Kirschner, MD  glipiZIDE (GLUCOTROL XL) 2.5 MG 24 hr tablet Take 1 tablet (2.5 mg total) by mouth daily with breakfast. Patient not taking: Reported on 08/10/2016 06/07/16   Mikey Kirschner, MD    Family History Family History  Problem Relation Age of Onset  . Cancer Mother     Breast    Social History Social History  Substance Use Topics  . Smoking status: Former Smoker    Packs/day: 0.50    Years: 54.00    Types: Cigarettes    Quit date: 11/19/1992  . Smokeless tobacco: Never Used  . Alcohol use Yes     Comment: Rare     Allergies   Morphine and related and Other   Review of Systems Review of Systems  Respiratory: Positive for cough, shortness of breath and wheezing.   All other systems reviewed and are negative.    Physical Exam Updated Vital Signs BP 191/80 (BP Location: Left Arm)   Pulse 85   Temp 97.5 F (36.4 C) (Oral)   Resp 25   Ht 6\' 2"  (1.88 m)   Wt 185 lb (83.9 kg)   SpO2 96%   BMI 23.75 kg/m   Physical Exam  Constitutional: He is oriented to person, place, and time.  He appears well-developed and well-nourished.  HENT:  Head: Normocephalic and atraumatic.  Right Ear: External ear normal.  Left Ear: External ear normal.  Nose: Nose normal.  Mouth/Throat: Oropharynx is clear and moist.  Eyes: Conjunctivae and EOM are normal. Pupils are equal, round, and reactive to light.  Neck: Normal range of motion. Neck supple.  Cardiovascular: Normal rate, regular rhythm, normal heart sounds and intact distal pulses.   Pulmonary/Chest: Tachypnea noted. He has wheezes.  Abdominal: Soft. Bowel sounds are normal.  Musculoskeletal: Normal range of motion.  Neurological: He is alert and oriented to person, place, and time.  Skin: Skin is warm and dry.  Psychiatric: He has a normal mood and affect. His behavior is normal. Judgment and thought content normal.  Nursing note and vitals reviewed.    ED Treatments / Results  Labs (all labs ordered are listed, but only abnormal results are displayed) Labs Reviewed  COMPREHENSIVE METABOLIC PANEL - Abnormal; Notable for the following:       Result Value   BUN 23 (*)    ALT 16 (*)    GFR calc non Af Amer 49 (*)    GFR calc Af Amer 57 (*)    All other components within normal limits  TROPONIN I - Abnormal; Notable for the following:    Troponin I 0.03 (*)    All other components within normal limits  CULTURE, BLOOD (ROUTINE X 2)  CULTURE, BLOOD (ROUTINE X 2)  CBC WITH DIFFERENTIAL/PLATELET  BRAIN NATRIURETIC PEPTIDE  URINALYSIS, ROUTINE W REFLEX MICROSCOPIC (NOT AT [facility])  I-STAT CG4 LACTIC ACID, ED  I-STAT CG4 LACTIC ACID, ED    EKG  EKG Interpretation  Date/Time:  Tuesday August 10 2016 13:45:14 EDT Ventricular Rate:  75 PR Interval:    QRS Duration: 157 QT Interval:  440 QTC Calculation: 492 R Axis:   -49 Text Interpretation:  Sinus rhythm Prolonged PR interval RBBB and LAFB No significant change since last tracing Confirmed by [facility] MD, Khole Branch ([id number]) on 08/10/2016 1:54:16 PM  Radiology Dg Chest Port 1 View  Result Date: 08/10/2016 CLINICAL DATA:  Cough and wheezing.  Shortness of breath. EXAM: PORTABLE CHEST 1 VIEW COMPARISON:  Chest radiograph 01/07/2014 FINDINGS: Cardiomediastinal silhouette remains enlarged. There is atherosclerotic calcification within the aortic arch. There is shallow lung inflation. New there is poor aeration of the left lung base. No overt pulmonary edema. No pneumothorax or sizable pleural effusion. IMPRESSION: 1. Shallow inspiration with left basilar opacities, likely atelectasis. 2. Cardiomegaly and aortic atherosclerosis without overt pulmonary edema. Electronically Signed   By: Ulyses Jarred M.D.   On: 08/10/2016 15:24    Procedures Procedures (including critical care time)  Medications Ordered in ED Medications  cefTRIAXone (ROCEPHIN) 1 g in dextrose 5 % 50 mL IVPB (not administered)  azithromycin (ZITHROMAX) tablet 500 mg (not administered)  albuterol (PROVENTIL,VENTOLIN) solution continuous neb (10 mg/hr Nebulization Given 08/10/16 1417)  methylPREDNISolone sodium succinate (SOLU-MEDROL) 125 mg/2 mL injection 125 mg (125 mg Intravenous Given 08/10/16 1410)     Initial Impression / Assessment and Plan / ED Course  I have reviewed the triage vital signs and the nursing notes.  Pertinent labs & imaging results that were available during my care of the patient were reviewed by me and considered in my medical decision making (see chart for details).  Clinical Course   Pt's breathing improving with nebs.  He feels better and is no longer tachypneic while seated.  We got pt up to ambulate with the pulse ox and his sat dropped to 66% after ambulating and breathing became labored.  After sitting, breathing improved and sats improved.    Pt d/w Dr. Denton Brick (triad) who will admit pt for obs.    Final Clinical Impressions(s) / ED Diagnoses   Final diagnoses:  Acute bronchitis, unspecified organism  Hypoxia  CRI (chronic renal  insufficiency), unspecified stage  Troponin I above reference range    New Prescriptions New Prescriptions   No medications on file     Isla Pence, MD 08/10/16 1645

## 2016-08-11 DIAGNOSIS — H3530 Unspecified macular degeneration: Secondary | ICD-10-CM | POA: Diagnosis present

## 2016-08-11 DIAGNOSIS — E782 Mixed hyperlipidemia: Secondary | ICD-10-CM | POA: Diagnosis present

## 2016-08-11 DIAGNOSIS — J441 Chronic obstructive pulmonary disease with (acute) exacerbation: Secondary | ICD-10-CM | POA: Diagnosis not present

## 2016-08-11 DIAGNOSIS — E1140 Type 2 diabetes mellitus with diabetic neuropathy, unspecified: Secondary | ICD-10-CM | POA: Diagnosis present

## 2016-08-11 DIAGNOSIS — I10 Essential (primary) hypertension: Secondary | ICD-10-CM | POA: Diagnosis present

## 2016-08-11 DIAGNOSIS — J189 Pneumonia, unspecified organism: Secondary | ICD-10-CM | POA: Diagnosis present

## 2016-08-11 DIAGNOSIS — J209 Acute bronchitis, unspecified: Secondary | ICD-10-CM | POA: Diagnosis present

## 2016-08-11 DIAGNOSIS — J440 Chronic obstructive pulmonary disease with acute lower respiratory infection: Secondary | ICD-10-CM | POA: Diagnosis present

## 2016-08-11 DIAGNOSIS — R0902 Hypoxemia: Secondary | ICD-10-CM

## 2016-08-11 DIAGNOSIS — H9190 Unspecified hearing loss, unspecified ear: Secondary | ICD-10-CM | POA: Diagnosis present

## 2016-08-11 DIAGNOSIS — N400 Enlarged prostate without lower urinary tract symptoms: Secondary | ICD-10-CM | POA: Diagnosis present

## 2016-08-11 DIAGNOSIS — R0602 Shortness of breath: Secondary | ICD-10-CM | POA: Diagnosis not present

## 2016-08-11 DIAGNOSIS — Z66 Do not resuscitate: Secondary | ICD-10-CM | POA: Diagnosis present

## 2016-08-11 DIAGNOSIS — Z8673 Personal history of transient ischemic attack (TIA), and cerebral infarction without residual deficits: Secondary | ICD-10-CM | POA: Diagnosis not present

## 2016-08-11 DIAGNOSIS — Z7984 Long term (current) use of oral hypoglycemic drugs: Secondary | ICD-10-CM | POA: Diagnosis not present

## 2016-08-11 DIAGNOSIS — T380X5A Adverse effect of glucocorticoids and synthetic analogues, initial encounter: Secondary | ICD-10-CM | POA: Diagnosis present

## 2016-08-11 DIAGNOSIS — Z87891 Personal history of nicotine dependence: Secondary | ICD-10-CM | POA: Diagnosis not present

## 2016-08-11 LAB — CBC
HCT: 41 % (ref 39.0–52.0)
Hemoglobin: 13.5 g/dL (ref 13.0–17.0)
MCH: 31.5 pg (ref 26.0–34.0)
MCHC: 32.9 g/dL (ref 30.0–36.0)
MCV: 95.8 fL (ref 78.0–100.0)
Platelets: 227 10*3/uL (ref 150–400)
RBC: 4.28 MIL/uL (ref 4.22–5.81)
RDW: 14.1 % (ref 11.5–15.5)
WBC: 5.3 10*3/uL (ref 4.0–10.5)

## 2016-08-11 LAB — BASIC METABOLIC PANEL
ANION GAP: 9 (ref 5–15)
BUN: 26 mg/dL — AB (ref 6–20)
CALCIUM: 8.9 mg/dL (ref 8.9–10.3)
CO2: 25 mmol/L (ref 22–32)
Chloride: 103 mmol/L (ref 101–111)
Creatinine, Ser: 1.14 mg/dL (ref 0.61–1.24)
GFR calc Af Amer: 59 mL/min — ABNORMAL LOW (ref >60)
GFR, EST NON AFRICAN AMERICAN: 51 mL/min — AB (ref >60)
GLUCOSE: 212 mg/dL — AB (ref 65–99)
Potassium: 4.8 mmol/L (ref 3.5–5.1)
SODIUM: 137 mmol/L (ref 135–145)

## 2016-08-11 LAB — GLUCOSE, CAPILLARY
GLUCOSE-CAPILLARY: 201 mg/dL — AB (ref 65–99)
GLUCOSE-CAPILLARY: 240 mg/dL — AB (ref 65–99)
Glucose-Capillary: 266 mg/dL — ABNORMAL HIGH (ref 65–99)

## 2016-08-11 MED ORDER — IPRATROPIUM-ALBUTEROL 0.5-2.5 (3) MG/3ML IN SOLN
3.0000 mL | Freq: Three times a day (TID) | RESPIRATORY_TRACT | Status: DC
  Administered 2016-08-11 – 2016-08-12 (×5): 3 mL via RESPIRATORY_TRACT
  Filled 2016-08-11 (×5): qty 3

## 2016-08-11 NOTE — Care Management Obs Status (Signed)
[facility] NOTIFICATION   Patient Details  Name: Christian Thomas MRN: [id number] Date of Birth: [birth year]   Medicare Observation Status Notification Given:  Yes    Tonio Seider, Chauncey Reading, RN 08/11/2016, 9:32 AM

## 2016-08-11 NOTE — Clinical Social Work Note (Signed)
Clinical Social Work Assessment  Patient Details  Name: Christian Thomas MRN: [id number] Date of Birth: [birth year]  Date of referral:  08/11/16               Reason for consult:  Facility Placement, Discharge Planning (From [facility])                Permission sought to share information with:    Permission granted to share information::     Name::        Agency::     Relationship::     Contact Information:  Steve Rattler Ward, [place], was at bedside.   Housing/Transportation Living arrangements for the past 2 months:  [place] of Information:  Patient, Other (Comment Required) Saddie Benders, [name] who is also POA.) Patient Interpreter Needed:  None Criminal Activity/Legal Involvement Pertinent to Current Situation/Hospitalization:    Significant Relationships:  Other Family Members Lives with:  Facility Resident Do you feel safe going back to the place where you live?  Yes Need for family participation in patient care:  Yes (Comment)  Care giving concerns:  Facility resident.    Social Worker assessment / plan:  Patient has been a resident at [facility] since 2012. He is independent in his ADLs, ambulates with a walker (he will use a wheelchair at times due to knee pain), and plans on returning to the facility at discharge.  Patient receives daily visits from his niece, Blanch Media and/or her husband.  Tammy at [facility] confirmed patient and family's statements. She indicated that patient can return at discharge.   Employment status:  Retired Forensic scientist:  Medicare PT Recommendations:  Not assessed at this time Information / Referral to community resources:     Patient/Family's Response to care:  Patient and family are agreeable to return to  [facility] at discharge.  Patient/Family's Understanding of and Emotional Response to Diagnosis, Current Treatment, and Prognosis: Patient and family have understanding of patient's diagnosis,  treatment and prognosis.    Emotional Assessment Appearance:  Appears stated age Attitude/Demeanor/Rapport:   (Cooperative) Affect (typically observed):  Accepting Orientation:  Oriented to Self, Oriented to Place, Oriented to  Time, Oriented to Situation Alcohol / Substance use:  Not Applicable Psych involvement (Current and /or in the community):  No (Comment)  Discharge Needs  Concerns to be addressed:   (Return to [facility]) Readmission within the last 30 days:  No Current discharge risk:  None Barriers to Discharge:  No Barriers Identified   Ihor Gully, LCSW 08/11/2016, 9:37 AM

## 2016-08-11 NOTE — Care Management Note (Signed)
Case Management Note  Patient Details  Name: Christian Thomas MRN: [id number] Date of Birth: 1915-11-03    Expected Discharge Date:        08/11/2016          Expected Discharge Plan:  Assisted Living / Rest Home  In-House Referral:  Clinical Social Work  Discharge planning Services  CM Consult  Post Acute Care Choice:  NA Choice offered to:  NA  DME Arranged:    DME Agency:     HH Arranged:    [place] Agency:     Status of Service:  In process, will continue to follow  If discussed at Long Length of Stay Meetings, dates discussed:    Additional Comments: Patient is from [place], plans to return at discharge. CSW aware and making arrangements. No CM needs.  Keigan Tafoya, Chauncey Reading, RN 08/11/2016, 9:28 AM

## 2016-08-11 NOTE — Progress Notes (Signed)
PROGRESS NOTE    Christian Thomas  [id number] DOB: [birth year] DOA: 08/10/2016 PCP: Mickie Hillier, MD    Brief Narrative: 82 yo with hx of COPD, not on home oxygen, assited living resident, hx of HLD, DM, admitted for COPD exacerbation with no evidence of PNA.  He was given oxygen, IV Steroids, and IV Zithromax.  His home meds for DM was continued.  He is feeling a little better today.     Assessment & Plan:   Principal Problem:   COPD exacerbation ([place]) with hypoxia and wheezy bronchitis Active Problems:   Essential hypertension, benign   DM2 (diabetes mellitus, type 2) ([place])   Hypoxia   1. COPD exacerbation:  Continue with IV steroids, nebs, oxygen, IV Zithromax, and will follow progress.  2. HTN:  BP is controlled. 3.    DM:  Continue with oral meds.  BS slightly elevated, likely due to steroids.   DVT prophylaxis: SQ heparin. Code Status: FULL CODE.  Family Communication: Niece, POA. Disposition Plan: to assited living when appropriate.   Consultants:   None.   Procedures:   None.  Antimicrobials: Anti-infectives    Start     Dose/Rate Route Frequency Ordered Stop   08/11/16 1000  azithromycin (ZITHROMAX) tablet 500 mg     500 mg Oral Daily 08/10/16 1755     08/10/16 1630  cefTRIAXone (ROCEPHIN) 1 g in dextrose 5 % 50 mL IVPB     1 g 100 mL/hr over 30 Minutes Intravenous  Once 08/10/16 1619 08/10/16 1726   08/10/16 1630  azithromycin (ZITHROMAX) tablet 500 mg     500 mg Oral  Once 08/10/16 1619 08/10/16 1656       Subjective:  Feeling a little better.    Objective: Vitals:   08/10/16 2012 08/10/16 2013 08/11/16 0520 08/11/16 0730  BP:   (!) 146/72   Pulse:  62 81   Resp:  16 16   Temp:   97.8 F (36.6 C)   TempSrc:   Oral   SpO2: 96% 96%  98%  Weight:      Height:        Intake/Output Summary (Last 24 hours) at 08/11/16 1236 Last data filed at 08/11/16 0900  Gross per 24 hour  Intake              240 ml  Output              150 ml  Net                90 ml   Filed Weights   08/10/16 1336  Weight: 83.9 kg (185 lb)    Examination:  General exam: Appears calm and comfortable  Respiratory system: diffuse wheezing still.  No rales.  Cardiovascular system: S1 & S2 heard, RRR. No JVD, murmurs, rubs, gallops or clicks. No pedal edema. Gastrointestinal system: Abdomen is nondistended, soft and nontender. No organomegaly or masses felt. Normal bowel sounds heard. Central nervous system: Alert and oriented. No focal neurological deficits. Extremities: Symmetric 5 x 5 power. Skin: No rashes, lesions or ulcers Psychiatry: Judgement and insight appear normal. Mood & affect appropriate.   Data Reviewed: I have personally reviewed following labs and imaging studies  CBC:  Recent Labs Lab 08/10/16 1355 08/11/16 0607  WBC 8.5 5.3  NEUTROABS 4.7  --   HGB 14.0 13.5  HCT 42.4 41.0  MCV 96.1 95.8  PLT 254 [id number]   Basic Metabolic Panel:  Recent Labs Lab 08/10/16  1355 08/11/16 0607  NA 137 137  K 4.8 4.8  CL 103 103  CO2 27 25  GLUCOSE 95 212*  BUN 23* 26*  CREATININE 1.18 1.14  CALCIUM 9.3 8.9   GFR: Estimated Creatinine Clearance: 41.1 mL/min (by C-G formula based on SCr of 1.14 mg/dL). Liver Function Tests:  Recent Labs Lab 08/10/16 1355  AST 19  ALT 16*  ALKPHOS 72  BILITOT 0.5  PROT 7.1  ALBUMIN 4.6   Cardiac Enzymes:  Recent Labs Lab 08/10/16 1355  TROPONINI 0.03*   CBG:  Recent Labs Lab 08/10/16 1957 08/11/16 0729 08/11/16 1201  GLUCAP 320* 201* 240*    Sepsis Labs:  Recent Labs Lab 08/10/16 1410  LATICACIDVEN 0.90    Recent Results (from the past 240 hour(s))  Blood culture (routine x 2)     Status: None (Preliminary result)   Collection Time: 08/10/16  4:29 PM  Result Value Ref Range Status   Specimen Description BLOOD RIGHT ARM  Final   Special Requests BOTTLES DRAWN AEROBIC AND ANAEROBIC 10CC  Final   Culture NO GROWTH < 24 HOURS  Final   Report Status PENDING  Incomplete    Blood culture (routine x 2)     Status: None (Preliminary result)   Collection Time: 08/10/16  4:37 PM  Result Value Ref Range Status   Specimen Description BLOOD RIGHT ARM  Final   Special Requests BOTTLES DRAWN AEROBIC AND ANAEROBIC 6CC  Final   Culture NO GROWTH < 24 HOURS  Final   Report Status PENDING  Incomplete  MRSA PCR Screening     Status: None   Collection Time: 08/10/16  6:33 PM  Result Value Ref Range Status   MRSA by PCR NEGATIVE NEGATIVE Final    Comment:        The GeneXpert MRSA Assay (FDA approved for NASAL specimens only), is one component of a comprehensive MRSA colonization surveillance program. It is not intended to diagnose MRSA infection nor to guide or monitor treatment for MRSA infections.      Radiology Studies: Dg Chest Port 1 View  Result Date: 08/10/2016 CLINICAL DATA:  Cough and wheezing.  Shortness of breath. EXAM: PORTABLE CHEST 1 VIEW COMPARISON:  Chest radiograph 01/07/2014 FINDINGS: Cardiomediastinal silhouette remains enlarged. There is atherosclerotic calcification within the aortic arch. There is shallow lung inflation. New there is poor aeration of the left lung base. No overt pulmonary edema. No pneumothorax or sizable pleural effusion. IMPRESSION: 1. Shallow inspiration with left basilar opacities, likely atelectasis. 2. Cardiomegaly and aortic atherosclerosis without overt pulmonary edema. Electronically Signed   By: Ulyses Jarred M.D.   On: 08/10/2016 15:24    Scheduled Meds: . acetaminophen  1,000 mg Oral BID  . allopurinol  300 mg Oral Daily  . amLODipine  5 mg Oral Daily  . azithromycin  500 mg Oral Daily  . enalapril  10 mg Oral Daily  . finasteride  5 mg Oral Daily  . glipiZIDE  2.5 mg Oral Q breakfast  . guaiFENesin  600 mg Oral BID  . heparin  5,000 Units Subcutaneous Q8H  . hydrOXYzine  25 mg Oral QHS  . insulin aspart  0-9 Units Subcutaneous TID WC  . ipratropium-albuterol  3 mL Nebulization TID  . lactulose  10 g Oral  Daily  . metFORMIN  500 mg Oral BID WC  . methylPREDNISolone (SOLU-MEDROL) injection  40 mg Intravenous Q8H  . multivitamin-lutein  1 capsule Oral Daily  . pantoprazole  40 mg  Oral Daily  . pravastatin  40 mg Oral q1800  . senna  2 tablet Oral QHS  . sodium chloride flush  3 mL Intravenous Q12H  . sodium chloride flush  3 mL Intravenous Q12H  . traZODone  50 mg Oral QHS     LOS: 0 days   Arrion Burruel, MD FACP Hospitalist.   If 7PM-7AM, please contact night-coverage [email or web address] Password [facility] 08/11/2016, 12:36 PM

## 2016-08-12 LAB — GLUCOSE, CAPILLARY: Glucose-Capillary: 209 mg/dL — ABNORMAL HIGH (ref 65–99)

## 2016-08-12 MED ORDER — PREDNISONE 10 MG (21) PO TBPK: ORAL_TABLET | ORAL | 0 refills | Status: DC

## 2016-08-12 MED ORDER — LEVOFLOXACIN 500 MG PO TABS: 500.0000 mg | ORAL_TABLET | Freq: Every day | ORAL | 0 refills | Status: DC

## 2016-08-12 NOTE — NC FL2 (Signed)
[place] LEVEL OF CARE SCREENING TOOL     IDENTIFICATION  Patient Name: Christian Thomas Birthdate: 1915-03-15 Sex: male Admission Date (Current Location): 08/10/2016  [facility] and [place] Number:  [employer] and Address:  [place] [address] [place]      Provider Number: [id number]  Attending Physician Name and Address:  Orvan Falconer, MD  Relative Name and Phone Number:       Current Level of Care: Hospital Recommended Level of Care: [place] Prior Approval Number:    Date Approved/Denied:   PASRR Number: [id number] ([id number])  Discharge Plan: Other (Comment) [name])    Current Diagnoses: Patient Active Problem List   Diagnosis Date Noted  . Hypoxia 08/10/2016  . COPD exacerbation ([place]) with hypoxia and wheezy bronchitis 08/10/2016  . Asthma with acute exacerbation 03/03/2016  . Degenerative arthritis of knee, bilateral 12/10/2014  . GI bleeding 12/14/2013  . Accelerated hypertension 12/14/2013  . Anemia due to blood loss, acute 12/14/2013  . Dizziness and giddiness 08/20/2013  . Cerebellar stroke syndrome 08/20/2013  . Hereditary and idiopathic peripheral neuropathy 06/15/2013  . Prostate hypertrophy 06/15/2013  . DM2 (diabetes mellitus, type 2) ([place]) 11/05/2012  . Femoral neck fracture ([place]) 11/05/2012  . Constipation 11/05/2012  . Syncope 07/07/2011  . Abnormal ECG 07/07/2011  . Essential hypertension, benign 07/07/2011    Orientation RESPIRATION BLADDER Height & Weight     Self, Time, Situation, Place  Normal Continent Weight: 185 lb (83.9 kg) Height:  6\' 2"  (188 cm)  BEHAVIORAL SYMPTOMS/MOOD NEUROLOGICAL BOWEL NUTRITION STATUS  Other (Comment) (none)   Continent Diet (Carb Modified/Heart Healthy)  AMBULATORY STATUS COMMUNICATION OF NEEDS Skin   Limited Assist Verbally Bruising                       Personal Care Assistance Level of Assistance   Bathing, Dressing, Feeding Bathing Assistance: Limited assistance Feeding assistance: Independent Dressing Assistance: Limited assistance     Functional Limitations Info  Sight, Hearing, Speech Sight Info: Adequate Hearing Info: Impaired Speech Info: Adequate    SPECIAL CARE FACTORS FREQUENCY                       Contractures Contractures Info: Not present    Additional Factors Info  Code Status, Allergies, Psychotropic, Insulin Sliding Scale Code Status Info: Full Allergies Info: Morphine and Related, Other Psychotropic Info: Xanax, Ultram, Desyrel         Current Medications (08/12/2016):  This is the current hospital active medication list Current Facility-Administered Medications  Medication Dose Route Frequency Provider Last Rate Last Dose  . 0.9 %  sodium chloride infusion  250 mL Intravenous PRN Roxan Hockey, MD      . acetaminophen (TYLENOL) tablet 650 mg  650 mg Oral Q6H PRN Roxan Hockey, MD       Or  . acetaminophen (TYLENOL) suppository 650 mg  650 mg Rectal Q6H PRN Roxan Hockey, MD      . acetaminophen (TYLENOL) tablet 1,000 mg  1,000 mg Oral BID Roxan Hockey, MD   1,000 mg at 08/12/16 0819  . albuterol (PROVENTIL) (2.5 MG/3ML) 0.083% nebulizer solution 2.5 mg  2.5 mg Nebulization Q2H PRN Roxan Hockey, MD      . allopurinol (ZYLOPRIM) tablet 300 mg  300 mg Oral Daily Roxan Hockey, MD   300 mg at 08/12/16 0816  . ALPRAZolam (XANAX) tablet 0.25 mg  0.25 mg Oral QHS PRN Roxan Hockey, MD      . amLODipine (NORVASC) tablet 5 mg  5 mg Oral Daily Roxan Hockey, MD   5 mg at 08/12/16 0816  . azithromycin (ZITHROMAX) tablet 500 mg  500 mg Oral Daily Roxan Hockey, MD   500 mg at 08/12/16 0814  . bisacodyl (DULCOLAX) suppository 10 mg  10 mg Rectal Daily PRN Roxan Hockey, MD      . enalapril (VASOTEC) tablet 10 mg  10 mg Oral Daily Orvan Falconer, MD   10 mg at 08/12/16 0818  . finasteride (PROSCAR) tablet 5 mg  5 mg Oral Daily Roxan Hockey, MD   5 mg at 08/12/16 0814  . glipiZIDE (GLUCOTROL XL) 24 hr tablet 2.5 mg  2.5 mg Oral Q breakfast Roxan Hockey, MD   2.5 mg at 08/12/16 0818  . guaiFENesin (MUCINEX) 12 hr tablet 600 mg  600 mg Oral BID Roxan Hockey, MD   600 mg at 08/12/16 0815  . heparin injection 5,000 Units  5,000 Units Subcutaneous Q8H Roxan Hockey, MD   5,000 Units at 08/12/16 0524  . hydrOXYzine (ATARAX/VISTARIL) tablet 25 mg  25 mg Oral QHS Roxan Hockey, MD   25 mg at 08/11/16 2158  . insulin aspart (novoLOG) injection 0-9 Units  0-9 Units Subcutaneous TID WC Roxan Hockey, MD   3 Units at 08/12/16 [id number]  . ipratropium-albuterol (DUONEB) 0.5-2.5 (3) MG/3ML nebulizer solution 3 mL  3 mL Nebulization TID Roxan Hockey, MD   3 mL at 08/12/16 0738  . lactulose (CHRONULAC) 10 GM/15ML solution 10 g  10 g Oral Daily Roxan Hockey, MD   10 g at 08/12/16 0815  . metFORMIN (GLUCOPHAGE) tablet 500 mg  500 mg Oral BID WC Roxan Hockey, MD   500 mg at 08/12/16 0818  . methylPREDNISolone sodium succinate (SOLU-MEDROL) 40 mg/mL injection 40 mg  40 mg Intravenous Q8H Roxan Hockey, MD   40 mg at 08/12/16 0820  . multivitamin-lutein (OCUVITE-LUTEIN) capsule 1 capsule  1 capsule Oral Daily Roxan Hockey, MD   1 capsule at 08/12/16 0815  . ondansetron (ZOFRAN) tablet 4 mg  4 mg Oral Q6H PRN Roxan Hockey, MD       Or  . ondansetron (ZOFRAN) injection 4 mg  4 mg Intravenous Q6H PRN Courage Emokpae, MD      . pantoprazole (PROTONIX) EC tablet 40 mg  40 mg Oral Daily Roxan Hockey, MD   40 mg at 08/12/16 0818  . pravastatin (PRAVACHOL) tablet 40 mg  40 mg Oral q1800 Roxan Hockey, MD   40 mg at 08/11/16 1627  . senna (SENOKOT) tablet 17.2 mg  2 tablet Oral QHS Roxan Hockey, MD   17.2 mg at 08/11/16 2159  . sodium chloride flush (NS) 0.9 % injection 3 mL  3 mL Intravenous Q12H Roxan Hockey, MD   3 mL at 08/12/16 [id number]  . sodium chloride flush (NS) 0.9 % injection 3 mL  3 mL Intravenous PRN Courage  Emokpae, MD      . sodium chloride flush (NS) 0.9 % injection 3 mL  3 mL Intravenous Q12H Roxan Hockey, MD   3 mL at 08/12/16 0821  . traMADol (ULTRAM) tablet 50 mg  50 mg Oral Q6H PRN Roxan Hockey, MD      . traZODone (DESYREL) tablet 50 mg  50 mg Oral QHS Roxan Hockey, MD   50 mg at 08/11/16 2159  . traZODone (DESYREL) tablet 50 mg  50 mg Oral QHS PRN  Roxan Hockey, MD         Discharge Medications: Medication List    STOP taking these medications   amoxicillin 500 MG capsule Commonly known as:  AMOXIL  ICAPS LUTEIN & ZEAXANTHIN Tbec  ranitidine 300 MG tablet Commonly known as:  ZANTAC    TAKE these medications   AEROCHAMBER MINI CHAMBER Devi Use as directed  albuterol (2.5 MG/3ML) 0.083% nebulizer solution Commonly known as:  PROVENTIL Take 3 mLs (2.5 mg total) by nebulization every 6 (six) hours as needed for wheezing or shortness of breath.  albuterol 108 (90 Base) MCG/ACT inhaler Commonly known as:  PROVENTIL HFA;VENTOLIN HFA Inhale 2 puffs into the lungs every 6 (six) hours as needed for wheezing or shortness of breath.  allopurinol 300 MG tablet Commonly known as:  ZYLOPRIM TAKE 1 TABLET BY MOUTH ONCE DAILY FOR GOUT.  ALPRAZolam 0.25 MG tablet Commonly known as:  XANAX Take 1 tablet at bedtime as needed for anxiety and itching.  amLODipine 5 MG tablet Commonly known as:  NORVASC Take 1 tablet (5 mg total) by mouth daily.  BIOFREEZE EX Apply 1 application topically 4 (four) times daily as needed (for pain).  capsaicin 0.025 % cream Commonly known as:  ZOSTRIX Apply 1 application topically 2 (two) times daily as needed (apply to left ankle and feet as needed).  diphenhydrAMINE 25 MG tablet Commonly known as:  BENADRYL Take 25 mg by mouth every 6 (six) hours as needed for itching.  enalapril 10 MG tablet Commonly known as:  VASOTEC TAKE (1) TABLET BY MOUTH TWICE DAILY.  finasteride 5 MG tablet Commonly known as:  PROSCAR Take 1 tablet (5 mg total) by  mouth daily.  FIRST-DUKES MOUTHWASH Susp One tablespoon swish and gargle qid  prn What changed:  how much to take  how to take this  when to take this  reasons to take this  additional instructions  glipiZIDE 2.5 MG 24 hr tablet Commonly known as:  GLUCOTROL XL Take 1 tablet (2.5 mg total) by mouth daily with breakfast.  hydrOXYzine 25 MG tablet Commonly known as:  ATARAX/VISTARIL TAKE ONE TABLET BY MOUTH AT BEDTIME AS NEEDED ITCHING/SLEEP. What changed:  See the new instructions.  INSTA-GLUCOSE 77.4 % Gel GIVE 1 TUBE BY MOUTH IF B/S <60; WAIT 15 MINUTES THEN RECHECK.  lactulose 10 GM/15ML solution Commonly known as:  CHRONULAC TAKE 2 TABLESPOONFULS (30ML) BY MOUTH ONCE DAILY.  levofloxacin 500 MG tablet Commonly known as:  LEVAQUIN Take 1 tablet (500 mg total) by mouth daily.  loratadine 10 MG tablet Commonly known as:  CLARITIN TAKE 1 TABLET BY MOUTH ONCE DAILY FOR ALLERGIC RHINITIS.  lovastatin 40 MG tablet Commonly known as:  MEVACOR TAKE (1) TABLET BY MOUTH AT BEDTIME FOR CHOLESTEROL.  meclizine 25 MG tablet Commonly known as:  ANTIVERT Take 25 mg by mouth every 6 (six) hours as needed for dizziness.  metFORMIN 500 MG tablet Commonly known as:  GLUCOPHAGE Take 1 tablet (500 mg total) by mouth 2 (two) times daily.  omeprazole 20 MG capsule Commonly known as:  PRILOSEC Take 1 capsule (20 mg total) by mouth 2 (two) times daily before a meal. What changed:  how much to take  when to take this  ondansetron 4 MG disintegrating tablet Commonly known as:  ZOFRAN ODT Take 1 tablet (4 mg total) by mouth every 8 (eight) hours as needed for nausea or vomiting.  predniSONE 10 MG (21) Tbpk tablet Commonly known as:  STERAPRED UNI-PAK 21 TAB Take  as directed.  acetaminophen 325 MG tablet Commonly known as:  TYLENOL Take 650 mg by mouth every 4 (four) hours as needed.  Q-PAP 500 MG tablet Generic drug:  acetaminophen TAKE 2 TABLETS BY MOUTH TWICE DAILY.  QC  ANTACID/ANTI-GAS 200-200-20 MG/5ML suspension Generic drug:  alum & mag hydroxide-simeth TAKE 2 TABLESPOONFULS (46ml) BY MOUTH EACH EVENING.  senna 8.6 MG Tabs tablet Commonly known as:  SENOKOT TAKE 2 TABLETS BY MOUTH AT BEDTIME.  SORE THROAT 1.4 % Liqd Generic drug:  phenol USE 5 SPRAYS IN THROAT EVERY 2 HOURS AS NEEDED FOR SORE THROAT.  traMADol 50 MG tablet Commonly known as:  ULTRAM TAKE 1 TABLET BY MOUTH TWICE DAILY AS NEEDED FOR PAIN.  traZODone 50 MG tablet Commonly known as:  DESYREL Take 1 tablet (50 mg total) by mouth at bedtime as needed for sleep.       Relevant Imaging Results:  Relevant Lab Results:   Additional Information    Salome Arnt, [place]

## 2016-08-12 NOTE — Discharge Summary (Signed)
Physician Discharge Summary  Christian Thomas [id number] DOB: 1915-07-06 DOA: 08/10/2016  PCP: Mickie Hillier, MD  Admit date: 08/10/2016 Discharge date: 08/12/2016  Admitted From: assisted living. Disposition:  Assisted living.   Recommendations for Outpatient Follow-up:  1. Follow up with PCP in 1-2 weeks  Home Health: no Equipment/Devices: walker.  Discharge Condition: Improved.  Ambulate with no oxygen, good sat.  Lungs with no wheezing.  CODE STATUS: FULL CODE.  Diet recommendation: As tolerated.  Carb modified.   Brief/Interim Summary: patient was admitted for COPD exacerbation, CAP by Dr Joesph Fillers on Sept 26, 2017.  As per his H and P:  " Christian Thomas  is a 82 y.o. male, who is a  reformed smoker with past medical history relevant for diabetes and  Hypertension  who presents with c/o URI symptoms for over 2 weeks now, in the ED O2 sats dropped down to the 60s with minimum activity, pt has audible wheezing from a distance. No definite pneumonia clinically and radiologically. No chest pains, no palpitations, patient does have chronic lower extremity edema, but no pleuritic symptoms or leg pains. Patient's daughter is at bedside, questions answered. Patient has had some low-grade fevers. No vomiting no diarrhea. No sick contacts at the assisted living facility. Patient's respiratory symptoms have worsened over the last several days despite bronchodilator use  HOSPITAL COURSE:  Patient was admitted into the hospital, and he was continued on his IV Steroids, nebs, and IV Rocephin and Zithromax as it was felt that he has CAP as well.  His wheezing improved, and he no longer required oxygen supplementation.  He was able to ambulate independently with his 4 pt walker.  His BSs were under controlled.  He is very anxious to go home, and he is stable for discharge.  He no longer has any wheezing.  I will give him another 5 days of Levoquin orally, along with finishing a prevpak prednisone.  He will  follow up with his PCP next week.  Dr Wolfgang Phoenix, thank you for allowing me to participate in the care of your nice patient.  Good Day.  Discharge Diagnoses:  Principal Problem:   COPD exacerbation ([place]) with hypoxia and wheezy bronchitis   CAP. Active Problems:   Essential hypertension, benign   DM2 (diabetes mellitus, type 2) ([place])   Hypoxia    Discharge Instructions  Discharge Instructions    Diet - low sodium heart healthy    Complete by:  As directed    Discharge instructions    Complete by:  As directed    Take your prednisone with food.  Follow up with your doctor next week.   Increase activity slowly    Complete by:  As directed        Medication List    STOP taking these medications   amoxicillin 500 MG capsule Commonly known as:  AMOXIL   ICAPS LUTEIN & ZEAXANTHIN Tbec   ranitidine 300 MG tablet Commonly known as:  ZANTAC     TAKE these medications   AEROCHAMBER MINI CHAMBER Devi Use as directed   albuterol (2.5 MG/3ML) 0.083% nebulizer solution Commonly known as:  PROVENTIL Take 3 mLs (2.5 mg total) by nebulization every 6 (six) hours as needed for wheezing or shortness of breath.   albuterol 108 (90 Base) MCG/ACT inhaler Commonly known as:  PROVENTIL HFA;VENTOLIN HFA Inhale 2 puffs into the lungs every 6 (six) hours as needed for wheezing or shortness of breath.   allopurinol 300 MG tablet Commonly known  as:  ZYLOPRIM TAKE 1 TABLET BY MOUTH ONCE DAILY FOR GOUT.   ALPRAZolam 0.25 MG tablet Commonly known as:  XANAX Take 1 tablet at bedtime as needed for anxiety and itching.   amLODipine 5 MG tablet Commonly known as:  NORVASC Take 1 tablet (5 mg total) by mouth daily.   BIOFREEZE EX Apply 1 application topically 4 (four) times daily as needed (for pain).   capsaicin 0.025 % cream Commonly known as:  ZOSTRIX Apply 1 application topically 2 (two) times daily as needed (apply to left ankle and feet as needed).   diphenhydrAMINE 25 MG  tablet Commonly known as:  BENADRYL Take 25 mg by mouth every 6 (six) hours as needed for itching.   enalapril 10 MG tablet Commonly known as:  VASOTEC TAKE (1) TABLET BY MOUTH TWICE DAILY.   finasteride 5 MG tablet Commonly known as:  PROSCAR Take 1 tablet (5 mg total) by mouth daily.   FIRST-DUKES MOUTHWASH Susp One tablespoon swish and gargle qid  prn What changed:  how much to take  how to take this  when to take this  reasons to take this  additional instructions   glipiZIDE 2.5 MG 24 hr tablet Commonly known as:  GLUCOTROL XL Take 1 tablet (2.5 mg total) by mouth daily with breakfast.   hydrOXYzine 25 MG tablet Commonly known as:  ATARAX/VISTARIL TAKE ONE TABLET BY MOUTH AT BEDTIME AS NEEDED ITCHING/SLEEP. What changed:  See the new instructions.   INSTA-GLUCOSE 77.4 % Gel GIVE 1 TUBE BY MOUTH IF B/S <60; WAIT 15 MINUTES THEN RECHECK.   lactulose 10 GM/15ML solution Commonly known as:  CHRONULAC TAKE 2 TABLESPOONFULS (30ML) BY MOUTH ONCE DAILY.   levofloxacin 500 MG tablet Commonly known as:  LEVAQUIN Take 1 tablet (500 mg total) by mouth daily.   loratadine 10 MG tablet Commonly known as:  CLARITIN TAKE 1 TABLET BY MOUTH ONCE DAILY FOR ALLERGIC RHINITIS.   lovastatin 40 MG tablet Commonly known as:  MEVACOR TAKE (1) TABLET BY MOUTH AT BEDTIME FOR CHOLESTEROL.   meclizine 25 MG tablet Commonly known as:  ANTIVERT Take 25 mg by mouth every 6 (six) hours as needed for dizziness.   metFORMIN 500 MG tablet Commonly known as:  GLUCOPHAGE Take 1 tablet (500 mg total) by mouth 2 (two) times daily.   omeprazole 20 MG capsule Commonly known as:  PRILOSEC Take 1 capsule (20 mg total) by mouth 2 (two) times daily before a meal. What changed:  how much to take  when to take this   ondansetron 4 MG disintegrating tablet Commonly known as:  ZOFRAN ODT Take 1 tablet (4 mg total) by mouth every 8 (eight) hours as needed for nausea or vomiting.    predniSONE 10 MG (21) Tbpk tablet Commonly known as:  STERAPRED UNI-PAK 21 TAB Take as directed.   acetaminophen 325 MG tablet Commonly known as:  TYLENOL Take 650 mg by mouth every 4 (four) hours as needed.   Q-PAP 500 MG tablet Generic drug:  acetaminophen TAKE 2 TABLETS BY MOUTH TWICE DAILY.   QC ANTACID/ANTI-GAS 200-200-20 MG/5ML suspension Generic drug:  alum & mag hydroxide-simeth TAKE 2 TABLESPOONFULS (27ml) BY MOUTH EACH EVENING.   senna 8.6 MG Tabs tablet Commonly known as:  SENOKOT TAKE 2 TABLETS BY MOUTH AT BEDTIME.   SORE THROAT 1.4 % Liqd Generic drug:  phenol USE 5 SPRAYS IN THROAT EVERY 2 HOURS AS NEEDED FOR SORE THROAT.   traMADol 50 MG tablet Commonly known  as:  ULTRAM TAKE 1 TABLET BY MOUTH TWICE DAILY AS NEEDED FOR PAIN.   traZODone 50 MG tablet Commonly known as:  DESYREL Take 1 tablet (50 mg total) by mouth at bedtime as needed for sleep.       Allergies  Allergen Reactions  . Morphine And Related   . Other     Opioids     Consultations:  None.    Procedures/Studies: Dg Chest Port 1 View  Result Date: 08/10/2016 CLINICAL DATA:  Cough and wheezing.  Shortness of breath. EXAM: PORTABLE CHEST 1 VIEW COMPARISON:  Chest radiograph 01/07/2014 FINDINGS: Cardiomediastinal silhouette remains enlarged. There is atherosclerotic calcification within the aortic arch. There is shallow lung inflation. New there is poor aeration of the left lung base. No overt pulmonary edema. No pneumothorax or sizable pleural effusion. IMPRESSION: 1. Shallow inspiration with left basilar opacities, likely atelectasis. 2. Cardiomegaly and aortic atherosclerosis without overt pulmonary edema. Electronically Signed   By: Ulyses Jarred M.D.   On: 08/10/2016 15:24     Subjective:    Discharge Exam: Vitals:   08/11/16 2115 08/12/16 0537  BP: (!) 135/45 (!) 150/65  Pulse: 91 82  Resp: 20 20  Temp: 97.5 F (36.4 C) 97.4 F (36.3 C)   Vitals:   08/11/16 2025  08/11/16 2115 08/12/16 0537 08/12/16 0741  BP:  (!) 135/45 (!) 150/65   Pulse:  91 82   Resp:  20 20   Temp:  97.5 F (36.4 C) 97.4 F (36.3 C)   TempSrc:  Oral Oral   SpO2: 96% 100% 100% 98%  Weight:      Height:        General: Pt is alert, awake, not in acute distress Cardiovascular: RRR, S1/S2 +, no rubs, no gallops Respiratory: CTA bilaterally, no wheezing, no rhonchi Abdominal: Soft, NT, ND, bowel sounds + Extremities: no edema, no cyanosis    The results of significant diagnostics from this hospitalization (including imaging, microbiology, ancillary and laboratory) are listed below for reference.     Microbiology: Recent Results (from the past 240 hour(s))  Blood culture (routine x 2)     Status: None (Preliminary result)   Collection Time: 08/10/16  4:29 PM  Result Value Ref Range Status   Specimen Description BLOOD RIGHT ARM  Final   Special Requests BOTTLES DRAWN AEROBIC AND ANAEROBIC 10CC  Final   Culture NO GROWTH 2 DAYS  Final   Report Status PENDING  Incomplete  Blood culture (routine x 2)     Status: None (Preliminary result)   Collection Time: 08/10/16  4:37 PM  Result Value Ref Range Status   Specimen Description BLOOD RIGHT ARM  Final   Special Requests BOTTLES DRAWN AEROBIC AND ANAEROBIC 6CC  Final   Culture NO GROWTH 2 DAYS  Final   Report Status PENDING  Incomplete  MRSA PCR Screening     Status: None   Collection Time: 08/10/16  6:33 PM  Result Value Ref Range Status   MRSA by PCR NEGATIVE NEGATIVE Final    Comment:        The GeneXpert MRSA Assay (FDA approved for NASAL specimens only), is one component of a comprehensive MRSA colonization surveillance program. It is not intended to diagnose MRSA infection nor to guide or monitor treatment for MRSA infections.       Recent Labs  08/10/16 1355  BNP 0000000   Basic Metabolic Panel:  Recent Labs Lab 08/10/16 1355 08/11/16 0607  NA 137 137  K 4.8 4.8  CL 103 103  CO2 27 25   GLUCOSE 95 212*  BUN 23* 26*  CREATININE 1.18 1.14  CALCIUM 9.3 8.9   Liver Function Tests:  Recent Labs Lab 08/10/16 1355  AST 19  ALT 16*  ALKPHOS 72  BILITOT 0.5  PROT 7.1  ALBUMIN 4.6   CBC:  Recent Labs Lab 08/10/16 1355 08/11/16 0607  WBC 8.5 5.3  NEUTROABS 4.7  --   HGB 14.0 13.5  HCT 42.4 41.0  MCV 96.1 95.8  PLT 254 227   Cardiac Enzymes:  Recent Labs Lab 08/10/16 1355  TROPONINI 0.03*   CBG:  Recent Labs Lab 08/10/16 1957 08/11/16 0729 08/11/16 1201 08/11/16 1602 08/12/16 0718  GLUCAP 320* 201* 240* 266* 209*   Urinalysis    Component Value Date/Time   COLORURINE YELLOW 08/10/2016 [place] 08/10/2016 1348   LABSPEC 1.010 08/10/2016 1348   PHURINE 7.0 08/10/2016 1348   GLUCOSEU NEGATIVE 08/10/2016 1348   HGBUR NEGATIVE 08/10/2016 1348   BILIRUBINUR NEGATIVE 08/10/2016 1348   KETONESUR NEGATIVE 08/10/2016 1348   PROTEINUR NEGATIVE 08/10/2016 1348   UROBILINOGEN 0.2 01/07/2014 0956   NITRITE NEGATIVE 08/10/2016 1348   LEUKOCYTESUR NEGATIVE 08/10/2016 1348   Microbiology Recent Results (from the past 240 hour(s))  Blood culture (routine x 2)     Status: None (Preliminary result)   Collection Time: 08/10/16  4:29 PM  Result Value Ref Range Status   Specimen Description BLOOD RIGHT ARM  Final   Special Requests BOTTLES DRAWN AEROBIC AND ANAEROBIC 10CC  Final   Culture NO GROWTH 2 DAYS  Final   Report Status PENDING  Incomplete  Blood culture (routine x 2)     Status: None (Preliminary result)   Collection Time: 08/10/16  4:37 PM  Result Value Ref Range Status   Specimen Description BLOOD RIGHT ARM  Final   Special Requests BOTTLES DRAWN AEROBIC AND ANAEROBIC 6CC  Final   Culture NO GROWTH 2 DAYS  Final   Report Status PENDING  Incomplete  MRSA PCR Screening     Status: None   Collection Time: 08/10/16  6:33 PM  Result Value Ref Range Status   MRSA by PCR NEGATIVE NEGATIVE Final    Comment:        The GeneXpert  MRSA Assay (FDA approved for NASAL specimens only), is one component of a comprehensive MRSA colonization surveillance program. It is not intended to diagnose MRSA infection nor to guide or monitor treatment for MRSA infections.      Time coordinating discharge: Over 30 minutes  SIGNED:   Orvan Falconer, MD FACP [facility] 08/12/2016, 12:29 PM   If 7PM-7AM, please contact night-coverage [email or web address] Password [email or web address]

## 2016-08-12 NOTE — Progress Notes (Signed)
Pt discharged in stable condition into the care of his niece via wheelchair into private vehicle.  PIV removed intact. No S&S of distress noted. Discharge instructions reviewed with pt/niece. Pt/niece verbalized understanding.

## 2016-08-12 NOTE — Clinical Social Work Note (Signed)
Pt d/c today back to [facility]. Pt's niece, Caryl Asp present in room and will provide transport. Facility aware and agreeable.  Christian Thomas, [name]

## 2016-08-12 NOTE — Progress Notes (Signed)
Christian Thomas discharged to [facility] per MD order.  Report called to Christian Thomas at 1500  .     Medication List    STOP taking these medications   amoxicillin 500 MG capsule Commonly known as:  AMOXIL   ICAPS LUTEIN & ZEAXANTHIN Tbec   ranitidine 300 MG tablet Commonly known as:  ZANTAC     TAKE these medications   AEROCHAMBER MINI CHAMBER Devi Use as directed   albuterol (2.5 MG/3ML) 0.083% nebulizer solution Commonly known as:  PROVENTIL Take 3 mLs (2.5 mg total) by nebulization every 6 (six) hours as needed for wheezing or shortness of breath.   albuterol 108 (90 Base) MCG/ACT inhaler Commonly known as:  PROVENTIL HFA;VENTOLIN HFA Inhale 2 puffs into the lungs every 6 (six) hours as needed for wheezing or shortness of breath.   allopurinol 300 MG tablet Commonly known as:  ZYLOPRIM TAKE 1 TABLET BY MOUTH ONCE DAILY FOR GOUT.   ALPRAZolam 0.25 MG tablet Commonly known as:  XANAX Take 1 tablet at bedtime as needed for anxiety and itching.   amLODipine 5 MG tablet Commonly known as:  NORVASC Take 1 tablet (5 mg total) by mouth daily.   BIOFREEZE EX Apply 1 application topically 4 (four) times daily as needed (for pain).   capsaicin 0.025 % cream Commonly known as:  ZOSTRIX Apply 1 application topically 2 (two) times daily as needed (apply to left ankle and feet as needed).   diphenhydrAMINE 25 MG tablet Commonly known as:  BENADRYL Take 25 mg by mouth every 6 (six) hours as needed for itching.   enalapril 10 MG tablet Commonly known as:  VASOTEC TAKE (1) TABLET BY MOUTH TWICE DAILY.   finasteride 5 MG tablet Commonly known as:  PROSCAR Take 1 tablet (5 mg total) by mouth daily.   FIRST-DUKES MOUTHWASH Susp One tablespoon swish and gargle qid  prn What changed:  how much to take  how to take this  when to take this  reasons to take this  additional instructions   glipiZIDE 2.5 MG 24 hr tablet Commonly known as:  GLUCOTROL XL Take 1 tablet (2.5  mg total) by mouth daily with breakfast.   hydrOXYzine 25 MG tablet Commonly known as:  ATARAX/VISTARIL TAKE ONE TABLET BY MOUTH AT BEDTIME AS NEEDED ITCHING/SLEEP. What changed:  See the new instructions.   INSTA-GLUCOSE 77.4 % Gel GIVE 1 TUBE BY MOUTH IF B/S <60; WAIT 15 MINUTES THEN RECHECK.   lactulose 10 GM/15ML solution Commonly known as:  CHRONULAC TAKE 2 TABLESPOONFULS (30ML) BY MOUTH ONCE DAILY.   levofloxacin 500 MG tablet Commonly known as:  LEVAQUIN Take 1 tablet (500 mg total) by mouth daily.   loratadine 10 MG tablet Commonly known as:  CLARITIN TAKE 1 TABLET BY MOUTH ONCE DAILY FOR ALLERGIC RHINITIS.   lovastatin 40 MG tablet Commonly known as:  MEVACOR TAKE (1) TABLET BY MOUTH AT BEDTIME FOR CHOLESTEROL.   meclizine 25 MG tablet Commonly known as:  ANTIVERT Take 25 mg by mouth every 6 (six) hours as needed for dizziness.   metFORMIN 500 MG tablet Commonly known as:  GLUCOPHAGE Take 1 tablet (500 mg total) by mouth 2 (two) times daily.   omeprazole 20 MG capsule Commonly known as:  PRILOSEC Take 1 capsule (20 mg total) by mouth 2 (two) times daily before a meal. What changed:  how much to take  when to take this   ondansetron 4 MG disintegrating tablet Commonly known as:  ZOFRAN ODT  Take 1 tablet (4 mg total) by mouth every 8 (eight) hours as needed for nausea or vomiting.   predniSONE 10 MG (21) Tbpk tablet Commonly known as:  STERAPRED UNI-PAK 21 TAB Take as directed.   acetaminophen 325 MG tablet Commonly known as:  TYLENOL Take 650 mg by mouth every 4 (four) hours as needed.   Q-PAP 500 MG tablet Generic drug:  acetaminophen TAKE 2 TABLETS BY MOUTH TWICE DAILY.   QC ANTACID/ANTI-GAS 200-200-20 MG/5ML suspension Generic drug:  alum & mag hydroxide-simeth TAKE 2 TABLESPOONFULS (12ml) BY MOUTH EACH EVENING.   senna 8.6 MG Tabs tablet Commonly known as:  SENOKOT TAKE 2 TABLETS BY MOUTH AT BEDTIME.   SORE THROAT 1.4 % Liqd Generic  drug:  phenol USE 5 SPRAYS IN THROAT EVERY 2 HOURS AS NEEDED FOR SORE THROAT.   traMADol 50 MG tablet Commonly known as:  ULTRAM TAKE 1 TABLET BY MOUTH TWICE DAILY AS NEEDED FOR PAIN.   traZODone 50 MG tablet Commonly known as:  DESYREL Take 1 tablet (50 mg total) by mouth at bedtime as needed for sleep.        IV site discontinued and catheter remains intact. Site with small skin tear from tape removal. Dressing and pressure applied.  Patient transported to nieces car via wheelchair... no distress noted upon discharge.  Christian Thomas Christian Thomas 08/12/2016 3:03 PM

## 2016-08-15 LAB — CULTURE, BLOOD (ROUTINE X 2)
CULTURE: NO GROWTH
Culture: NO GROWTH

## 2016-08-27 ENCOUNTER — Other Ambulatory Visit: Payer: Self-pay

## 2016-08-27 MED ORDER — TRAMADOL HCL 50 MG PO TABS: ORAL_TABLET | ORAL | 5 refills | Status: DC

## 2016-09-07 ENCOUNTER — Ambulatory Visit (INDEPENDENT_AMBULATORY_CARE_PROVIDER_SITE_OTHER): Payer: Medicare Other | Admitting: Family Medicine

## 2016-09-07 ENCOUNTER — Encounter: Payer: Self-pay | Admitting: Family Medicine

## 2016-09-07 ENCOUNTER — Telehealth: Payer: Self-pay | Admitting: Family Medicine

## 2016-09-07 VITALS — BP 120/68 | HR 86 | Temp 98.7°F | Ht 74.0 in | Wt 189.0 lb

## 2016-09-07 DIAGNOSIS — M170 Bilateral primary osteoarthritis of knee: Secondary | ICD-10-CM | POA: Diagnosis not present

## 2016-09-07 DIAGNOSIS — E7800 Pure hypercholesterolemia, unspecified: Secondary | ICD-10-CM | POA: Diagnosis not present

## 2016-09-07 DIAGNOSIS — I10 Essential (primary) hypertension: Secondary | ICD-10-CM

## 2016-09-07 DIAGNOSIS — E119 Type 2 diabetes mellitus without complications: Secondary | ICD-10-CM | POA: Diagnosis not present

## 2016-09-07 DIAGNOSIS — E1151 Type 2 diabetes mellitus with diabetic peripheral angiopathy without gangrene: Secondary | ICD-10-CM | POA: Diagnosis not present

## 2016-09-07 DIAGNOSIS — L84 Corns and callosities: Secondary | ICD-10-CM | POA: Diagnosis not present

## 2016-09-07 LAB — POCT GLYCOSYLATED HEMOGLOBIN (HGB A1C): Hemoglobin A1C: 7.2

## 2016-09-07 NOTE — Progress Notes (Signed)
   Subjective:    Patient ID: Christian Thomas, male    DOB: [birth year], 82 y.o.   MRN: [id number] Patient arrives office with numerous concerns Diabetes  He presents for his follow-up diabetic visit. He has type 2 diabetes mellitus.  Patient claims compliance with diabetes medication. No obvious side effects. Reports no substantial low sugar spells. Most numbers are generally in good range when checked fasting. Generally does not miss a dose of medication. Watching diabetic diet closely  Blood pressure medicine and blood pressure levels reviewed today with patient. Compliant with blood pressure medicine. States does not miss a dose. No obvious side effects. Blood pressure generally good when checked elsewhere. Watching salt intake.   A1C today. 7.2  Having shortness of breath today. O2 96 recently hospitalized for exacerbation COPD. Overall breathing is better than usual.  Knee pain really bad the past month. States deftly needs injections in his knees.  Burning in feet and aching. Neuropathy flares up on his feet get to cold some difficulties with this lately.    Review of Systems No headache, no major weight loss or weight gain, no chest pain no back pain abdominal pain no change in bowel habits complete ROS otherwise negative Alert pleasant no acute distress vital stable    Objective:   Physical Exam   Alert pleasant no acute distress vital stable lungs clear no wheezes or crackles heart regular rhythm ankles baseline 1+ edema bilateral  Patient was prepped draped anesthetized and injected bilateral with Depo-Medrol plus Xylocaine.     Assessment & Plan:  Impression type 2 diabetes clinically stable and good control considering comorbidities #2 recent exacerbation of COPD. Still using nebulizer regularly. Advise can back off to when necessary. #3 ongoing neuropathy #4 severe arthritis knees both injected at this time #5 hypertension stable continue same treatment numbers  reviewed. Follow-up in 3 months

## 2016-09-07 NOTE — Telephone Encounter (Signed)
Notified Courtney at [facility] who verbalized understanding.

## 2016-09-07 NOTE — Telephone Encounter (Signed)
alsbuterol nebulizer treatments

## 2016-09-07 NOTE — Telephone Encounter (Signed)
Patient was seen today and Dr. Richardson Landry wrote on the physician provider visit form that a medication changed to PRN, but Levada Dy and the pharmacy are unable to make this out.  Please advise.

## 2016-09-14 ENCOUNTER — Telehealth: Payer: Self-pay | Admitting: Family Medicine

## 2016-09-14 NOTE — Telephone Encounter (Signed)
Review blood pressure readings from [place].

## 2016-09-19 MED ORDER — METHYLPREDNISOLONE ACETATE 40 MG/ML IJ SUSP: 40.0000 mg | Freq: Once | INTRAMUSCULAR | Status: DC

## 2016-10-21 ENCOUNTER — Ambulatory Visit (INDEPENDENT_AMBULATORY_CARE_PROVIDER_SITE_OTHER): Payer: Medicare Other | Admitting: Otolaryngology

## 2016-10-21 ENCOUNTER — Other Ambulatory Visit: Payer: Self-pay

## 2016-10-21 DIAGNOSIS — H6123 Impacted cerumen, bilateral: Secondary | ICD-10-CM | POA: Diagnosis not present

## 2016-11-23 DIAGNOSIS — E1151 Type 2 diabetes mellitus with diabetic peripheral angiopathy without gangrene: Secondary | ICD-10-CM | POA: Diagnosis not present

## 2016-11-23 DIAGNOSIS — L84 Corns and callosities: Secondary | ICD-10-CM | POA: Diagnosis not present

## 2016-12-08 ENCOUNTER — Ambulatory Visit: Payer: Medicare Other | Admitting: Family Medicine

## 2016-12-09 ENCOUNTER — Encounter: Payer: Self-pay | Admitting: Family Medicine

## 2016-12-09 ENCOUNTER — Ambulatory Visit (INDEPENDENT_AMBULATORY_CARE_PROVIDER_SITE_OTHER): Payer: Medicare Other | Admitting: Family Medicine

## 2016-12-09 VITALS — BP 110/72 | Ht 74.0 in | Wt 187.0 lb

## 2016-12-09 DIAGNOSIS — E7800 Pure hypercholesterolemia, unspecified: Secondary | ICD-10-CM

## 2016-12-09 DIAGNOSIS — M170 Bilateral primary osteoarthritis of knee: Secondary | ICD-10-CM

## 2016-12-09 DIAGNOSIS — E119 Type 2 diabetes mellitus without complications: Secondary | ICD-10-CM | POA: Diagnosis not present

## 2016-12-09 DIAGNOSIS — I10 Essential (primary) hypertension: Secondary | ICD-10-CM

## 2016-12-09 LAB — POCT GLYCOSYLATED HEMOGLOBIN (HGB A1C): Hemoglobin A1C: 5.4

## 2016-12-09 NOTE — Progress Notes (Signed)
   Subjective:    Patient ID: Christian Thomas, male    DOB: Nov 04, 1915, 83 y.o.   MRN: [id number]  Diabetes  He presents for his follow-up diabetic visit. He has type 2 diabetes mellitus. There are no hypoglycemic associated symptoms. There are no diabetic associated symptoms. There are no hypoglycemic complications. There are no diabetic complications. There are no known risk factors for coronary artery disease. Current diabetic treatment includes oral agent (dual therapy). He is compliant with treatment all of the time.   Results for orders placed or performed in visit on 12/09/16  POCT glycosylated hemoglobin (Hb A1C)  Result Value Ref Range   Hemoglobin A1C 5.4     Patient claims compliance with diabetes medication. No obvious side effects. Reports no substantial low sugar spells. Most numbers are generally in good range when checked fasting. Generally does not miss a dose of medication. Watching diabetic diet closely  Blood pressure medicine and blood pressure levels reviewed today with patient. Compliant with blood pressure medicine. States does not miss a dose. No obvious side effects. Blood pressure generally good when checked elsewhere. Watching salt intake.  Patient continues to take lipid medication regularly. No obvious side effects from it. Generally does not miss a dose. Prior blood work results are reviewed with patient. Patient continues to work on fat intake in diet     Patient still has trouble with his knees and feet hurting. Sometimes his feet wakes him up in the middle of the night hurting.    Patient has not had a diabetic eye exam.   Results for orders placed or performed in visit on 12/09/16  POCT glycosylated hemoglobin (Hb A1C)  Result Value Ref Range   Hemoglobin A1C 5.4     Review of Systems    No headache, no major weight loss or weight gain, no chest pain no back pain abdominal pain no change in bowel habits complete ROS otherwise negative  Objective:    Physical Exam  Alert vitals stable, NAD. Blood pressure good on repeat. HEENT normal. Lungs clear. Heart regular rate and rhythm.  Patient was draped prepped and anesthetized. Both knees were injected with 1 mL Depo-Medrol 2 mL plain Xylocaine. Under sterile conditions.      Assessment & Plan:  Impression 1 type 2 diabetes tight control discussed medications adjusted #2 chronic neuropathic pain discussed medications prescribed. #3 hypertension good control discussed maintain same #4 hyperlipidemia prior blood work discussed reviewed to maintain same #5 chronic severe knee pain. Patient injected appropriately postinjection interventions discussed. Follow-up as scheduled. WSL

## 2016-12-22 ENCOUNTER — Telehealth: Payer: Self-pay | Admitting: Family Medicine

## 2016-12-22 MED ORDER — NORTRIPTYLINE HCL 10 MG PO CAPS: 10.0000 mg | ORAL_CAPSULE | Freq: Every day | ORAL | 11 refills | Status: DC

## 2016-12-22 NOTE — Telephone Encounter (Signed)
Left message return call 12/22/16

## 2016-12-22 NOTE — Telephone Encounter (Signed)
Spoke with Sharyn Lull with [name] and informed her per Dr.Steve  Luking- Pt has been on every class of meds known for essential tremor, none further to try May add nortyptiline 10 qhs for neuropathic pain, 30 11 ref. Pt nt candidate for narcotics due to morphine allergy. Sharyn Lull with [name] verbalized understanding.

## 2016-12-22 NOTE — Telephone Encounter (Signed)
Pt has been on every class of meds known for essential tremor, none further to try  May add nortyptiline 10 qhs for neuropathic pain, 30 11 ref. Pt nt candidate for narcotics due to morphine allergy

## 2016-12-22 NOTE — Telephone Encounter (Signed)
Sharyn Lull at [facility] called to let Dr. Richardson Landry know that patient is having increased tremors and a shooting pain in right heel.  He has been taking the Tramadol, but not really helping.  Please advise.

## 2016-12-26 MED ORDER — METHYLPREDNISOLONE ACETATE 40 MG/ML IJ SUSP: 40.0000 mg | Freq: Once | INTRAMUSCULAR | Status: DC

## 2017-01-06 ENCOUNTER — Telehealth: Payer: Self-pay | Admitting: *Deleted

## 2017-01-06 NOTE — Telephone Encounter (Signed)
Written on order, MOM 30 cc's prn constip, anusol hc cr bid to ffected area for 14 d

## 2017-01-06 NOTE — Telephone Encounter (Signed)
Fax from [facility] [place] - resident is complaining of constipation and hemorroids. Would like an order for both. Order in dr steve's folder. [facility] does not accept verbal orders.

## 2017-01-06 NOTE — Telephone Encounter (Signed)
Order faxed to [facility]

## 2017-02-08 DIAGNOSIS — E1151 Type 2 diabetes mellitus with diabetic peripheral angiopathy without gangrene: Secondary | ICD-10-CM | POA: Diagnosis not present

## 2017-02-08 DIAGNOSIS — L84 Corns and callosities: Secondary | ICD-10-CM | POA: Diagnosis not present

## 2017-02-10 ENCOUNTER — Telehealth: Payer: Self-pay | Admitting: *Deleted

## 2017-02-10 NOTE — Telephone Encounter (Signed)
He takes senokot 2 daily and he is having some discomfort.

## 2017-02-10 NOTE — Telephone Encounter (Signed)
Please call and find out what pt is taking EVERY day, and what he is taking PRN for constipation  I it causing the pt any discomfort or distress?

## 2017-02-10 NOTE — Telephone Encounter (Signed)
Order faxed to [facility]

## 2017-02-10 NOTE — Telephone Encounter (Signed)
Dulcolax tabs two one time dose  Start daily miralax one tablespoon win glass water, not prn

## 2017-02-10 NOTE — Telephone Encounter (Signed)
Fax received from [facility]. Pt complaining of not having a bowel movement after taking prn meds. Called and talked to Poth - med tech at [facility]. She states last BM was 2 days ago. It was hard. Tried milk of magnesia on 2/27 with no results.

## 2017-03-02 ENCOUNTER — Other Ambulatory Visit: Payer: Self-pay | Admitting: *Deleted

## 2017-03-02 ENCOUNTER — Telehealth: Payer: Self-pay | Admitting: *Deleted

## 2017-03-02 MED ORDER — ALPRAZOLAM 0.25 MG PO TABS: ORAL_TABLET | ORAL | 5 refills | Status: DC

## 2017-03-02 NOTE — Telephone Encounter (Signed)
Please disregard message about bp readings. Wrong pt. Just need rx for xanax for Mr. Corrow

## 2017-03-02 NOTE — Telephone Encounter (Signed)
May ref times six 

## 2017-03-02 NOTE — Telephone Encounter (Signed)
Incoming fax from [facility] requesting a hard script of xanax 0.25mg  as need for anxiety and itching at bedtime. Please call facility to pickup script when ready. [phone]. Also see form in dr steve's office with bp readings.

## 2017-03-02 NOTE — Telephone Encounter (Signed)
done

## 2017-03-15 ENCOUNTER — Ambulatory Visit: Payer: Medicare Other | Admitting: Family Medicine

## 2017-03-16 ENCOUNTER — Encounter: Payer: Self-pay | Admitting: Family Medicine

## 2017-03-16 ENCOUNTER — Ambulatory Visit (INDEPENDENT_AMBULATORY_CARE_PROVIDER_SITE_OTHER): Payer: Medicare Other | Admitting: Family Medicine

## 2017-03-16 VITALS — BP 118/72 | Ht 74.0 in | Wt 183.0 lb

## 2017-03-16 DIAGNOSIS — I10 Essential (primary) hypertension: Secondary | ICD-10-CM

## 2017-03-16 DIAGNOSIS — E119 Type 2 diabetes mellitus without complications: Secondary | ICD-10-CM | POA: Diagnosis not present

## 2017-03-16 DIAGNOSIS — G609 Hereditary and idiopathic neuropathy, unspecified: Secondary | ICD-10-CM

## 2017-03-16 DIAGNOSIS — M170 Bilateral primary osteoarthritis of knee: Secondary | ICD-10-CM

## 2017-03-16 LAB — POCT GLYCOSYLATED HEMOGLOBIN (HGB A1C): Hemoglobin A1C: 5.9

## 2017-03-16 MED ORDER — OLOPATADINE HCL 0.2 % OP SOLN: OPHTHALMIC | 0 refills | Status: DC

## 2017-03-16 NOTE — Progress Notes (Signed)
   Subjective:    Patient ID: Christian Thomas, male    DOB: 1915/06/28, 83 y.o.   MRN: [id number]  Diabetes  He presents for his follow-up diabetic visit. He has type 2 diabetes mellitus. Risk factors for coronary artery disease include diabetes mellitus, family history, hypertension and sedentary lifestyle. Current diabetic treatment includes oral agent (dual therapy). He is compliant with treatment all of the time. His weight is stable. He is following a diabetic diet.   Results for orders placed or performed in visit on 03/16/17  POCT glycosylated hemoglobin (Hb A1C)  Result Value Ref Range   Hemoglobin A1C 5.9     Patient needs knees injected, Both knees, patient definitely derives benefit. Unfortunately ongoing pain.  Blood pressure medicine and blood pressure levels reviewed today with patient. Compliant with blood pressure medicine. States does not miss a dose. No obvious side effects. Blood pressure generally good when checked elsewhere. Watching salt intake.  Patient continues to take lipid medication regularly. No obvious side effects from it. Generally does not miss a dose. Prior blood work results are reviewed with patient. Patient continues to work on fat intake in diet   Patient notes ongoing compliance with antidepressant medication. No obvious side effects. Reports does not miss a dose. Overall continues to help depression substantially. No thoughts of homicide or suicide. Would like to maintain medication.  Asthma overall clinically stable uses nebulizer when necessary.  Review of Systems No headache, no major weight loss or weight gain, no chest pain no back pain abdominal pain no change in bowel habits complete ROS otherwise negative     Objective:   Physical Exam Alert and oriented, vitals reviewed and stable, NAD ENT-TM's and ext canals WNL bilat via otoscopic exam Soft palate, tonsils and post pharynx WNL via oropharyngeal exam Neck-symmetric, no masses; thyroid  nonpalpable and nontender Pulmonary-no tachypnea or accessory muscle use; Clear without wheezes via auscultation Card--no abnrml murmurs, rhythm reg and rate WNL Carotid pulses symmetric, without bruits Feet diabetic foot exam see dense neuropathy evident Knees bilateral crepitations with some deformity evident and effusion   Patient was prepped draped anesthetized and injected with Depo-Medrol plus Xylocaine. 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

## 2017-03-20 MED ORDER — METHYLPREDNISOLONE ACETATE 40 MG/ML IJ SUSP: 40.0000 mg | Freq: Once | INTRAMUSCULAR | Status: DC

## 2017-04-04 ENCOUNTER — Ambulatory Visit (INDEPENDENT_AMBULATORY_CARE_PROVIDER_SITE_OTHER): Payer: Medicare Other | Admitting: Family Medicine

## 2017-04-04 ENCOUNTER — Encounter: Payer: Self-pay | Admitting: Family Medicine

## 2017-04-04 VITALS — BP 138/74 | HR 61 | Ht 74.0 in | Wt 182.1 lb

## 2017-04-04 DIAGNOSIS — J441 Chronic obstructive pulmonary disease with (acute) exacerbation: Secondary | ICD-10-CM

## 2017-04-04 MED ORDER — ALBUTEROL SULFATE (2.5 MG/3ML) 0.083% IN NEBU: 2.5000 mg | INHALATION_SOLUTION | RESPIRATORY_TRACT | 5 refills | Status: DC | PRN

## 2017-04-04 MED ORDER — CEPHALEXIN 500 MG PO CAPS: ORAL_CAPSULE | ORAL | 0 refills | Status: DC

## 2017-04-04 MED ORDER — PREDNISONE 10 MG PO TABS: ORAL_TABLET | ORAL | 0 refills | Status: DC

## 2017-04-04 NOTE — Progress Notes (Signed)
   Subjective:    Patient ID: Christian Thomas, male    DOB: February 21, 1915, 83 y.o.   MRN: [id number]  Cough  This is a new problem. The current episode started in the past 7 days. Associated symptoms include shortness of breath and wheezing.   Also has concerns of not being able to stand long. Due to weakness from breathing.  No obvious fevers.  Frustrated breathing treatments are given every 6 hours feels like he needs some earlier than this. Next  Cough is productive at times.  Distinct increase wheezing the past week.   Review of Systems  Respiratory: Positive for cough, shortness of breath and wheezing.        Objective:   Physical Exam Alert active talkative slight breathing distress with deep breath slight wheezy texture noted no tachypnea heart rare rhythm lungs diminished breath sounds diffusely with increase in background wheezing       Assessment & Plan:  Impression exacerbation of COPD plan increase nebulizer to every 4 hours. Prednisone taper. Antibiotics prescribed symptom care discussed

## 2017-04-08 ENCOUNTER — Telehealth: Payer: Self-pay | Admitting: Family Medicine

## 2017-04-08 ENCOUNTER — Other Ambulatory Visit: Payer: Self-pay | Admitting: *Deleted

## 2017-04-08 MED ORDER — TRAMADOL HCL 50 MG PO TABS: ORAL_TABLET | ORAL | 3 refills | Status: DC

## 2017-04-08 NOTE — Telephone Encounter (Signed)
May have this +3 refills 

## 2017-04-08 NOTE — Telephone Encounter (Signed)
[facility] called to request refill on Olopatadine HCL. Form requires signature. In Dr. Bary Leriche box.

## 2017-04-11 NOTE — Telephone Encounter (Signed)
This form was signed

## 2017-04-25 ENCOUNTER — Telehealth: Payer: Self-pay | Admitting: Family Medicine

## 2017-04-25 ENCOUNTER — Encounter: Payer: Self-pay | Admitting: Family Medicine

## 2017-04-25 DIAGNOSIS — M25552 Pain in left hip: Secondary | ICD-10-CM | POA: Diagnosis not present

## 2017-04-25 NOTE — Telephone Encounter (Signed)
Left hip xray

## 2017-04-25 NOTE — Telephone Encounter (Signed)
Patient is having a hard time with his left hip and its making his back hurt.  Caryl Asp is wanting to know if we would be willing to send an order for an x-ray to [facility].  They have someone who comes there to do the x-rays.

## 2017-04-25 NOTE — Telephone Encounter (Signed)
Spoke with Christian Thomas and informed her per Dr.Steve Luking- we are going to fax in a order for Left hip xray. Patient verbalized understanding.

## 2017-04-26 ENCOUNTER — Other Ambulatory Visit: Payer: Self-pay | Admitting: *Deleted

## 2017-04-26 DIAGNOSIS — E1151 Type 2 diabetes mellitus with diabetic peripheral angiopathy without gangrene: Secondary | ICD-10-CM | POA: Diagnosis not present

## 2017-04-26 DIAGNOSIS — L84 Corns and callosities: Secondary | ICD-10-CM | POA: Diagnosis not present

## 2017-04-26 MED ORDER — TRAMADOL HCL 50 MG PO TABS: ORAL_TABLET | ORAL | 0 refills | Status: DC

## 2017-04-26 MED ORDER — TRAZODONE HCL 50 MG PO TABS: ORAL_TABLET | ORAL | 0 refills | Status: DC

## 2017-04-26 NOTE — Telephone Encounter (Signed)
Sorry where are results?

## 2017-04-26 NOTE — Telephone Encounter (Signed)
Order faxed to [facility] and to the pharm. [name] notified.

## 2017-04-26 NOTE — Telephone Encounter (Signed)
Spoke with med tech at [facility]. He takesTylenol 500mg  2 bid and tramadol 50mg  one bid. And has prn tylenol 325mg  every 4 hours.

## 2017-04-26 NOTE — Telephone Encounter (Signed)
Xray shows only mild arthritis? What's he taking for pain and how often?

## 2017-04-26 NOTE — Telephone Encounter (Signed)
Sorry, Yellow box on wall in office.

## 2017-04-26 NOTE — Telephone Encounter (Signed)
incr tramadol to one qid

## 2017-04-26 NOTE — Telephone Encounter (Signed)
[facility] faxed results over.  See in results folder.

## 2017-05-24 ENCOUNTER — Ambulatory Visit (INDEPENDENT_AMBULATORY_CARE_PROVIDER_SITE_OTHER): Payer: Medicare Other | Admitting: Family Medicine

## 2017-05-24 ENCOUNTER — Encounter: Payer: Self-pay | Admitting: Family Medicine

## 2017-05-24 VITALS — BP 124/70 | Ht 74.0 in

## 2017-05-24 DIAGNOSIS — M170 Bilateral primary osteoarthritis of knee: Secondary | ICD-10-CM | POA: Diagnosis not present

## 2017-05-24 DIAGNOSIS — I10 Essential (primary) hypertension: Secondary | ICD-10-CM | POA: Diagnosis not present

## 2017-05-24 DIAGNOSIS — R21 Rash and other nonspecific skin eruption: Secondary | ICD-10-CM | POA: Diagnosis not present

## 2017-05-24 NOTE — Progress Notes (Signed)
   Subjective:    Patient ID: Christian Thomas, male    DOB: [birth year], 83 y.o.   MRN: [id number] Patient arrives office with several concerns HPI: Patient is here today with [name] POA, states [name] is needing a face to face consultation with [place] today so that the pt can have Physical therapy for his knees. Knee arthritis is worsening. Literally bone-on-bone. Patient not a surgical candidate. We injected every 3 months. Pain is getting progressively worse. Facility has offered physical therapy.   Per [name], Patient also complains of a rash on this buttocks. Irritated in nature. Sitting a lot in the wheelchair and in the chair. Immediately the presacral region. No major itching  Occasional cough. Uses inhaler or nebulizer daily No major shortness of breath  Review of Systems No headache, no major weight loss or weight gain, no chest pain no back pain abdominal pain no change in bowel habits complete ROS otherwise negative     Objective:   Physical Exam Alert and oriented, vitals reviewed and stable, NAD ENT-TM's and ext canals WNL bilat via otoscopic exam Soft palate, tonsils and post pharynx WNL via oropharyngeal exam Neck-symmetric, no masses; thyroid nonpalpable and nontender Pulmonary-no tachypnea or accessory muscle use; Clear without wheezes via auscultation Card--no abnrml murmurs, rhythm reg and rate WNL Carotid pulses symmetric, without bruits Knees bilateral crepitation positive effusion  Presacral pressure rash no skin breakdown thickening evident     Assessment & Plan:  Impression 1 progressive knee arthritis. In wheelchair mostly. We will work on PT referral #2 presacral rash irritant nature measures discussed reduce #3 chronic reactive airways proper use of inhaler discussed  Greater than 50% of this 25 minute face to face visit was spent in counseling and discussion and coordination of care regarding the above diagnosis/diagnosies

## 2017-05-26 DIAGNOSIS — Z9181 History of falling: Secondary | ICD-10-CM | POA: Diagnosis not present

## 2017-05-26 DIAGNOSIS — E1142 Type 2 diabetes mellitus with diabetic polyneuropathy: Secondary | ICD-10-CM | POA: Diagnosis not present

## 2017-05-26 DIAGNOSIS — I110 Hypertensive heart disease with heart failure: Secondary | ICD-10-CM | POA: Diagnosis not present

## 2017-05-26 DIAGNOSIS — Z7984 Long term (current) use of oral hypoglycemic drugs: Secondary | ICD-10-CM | POA: Diagnosis not present

## 2017-05-26 DIAGNOSIS — H3530 Unspecified macular degeneration: Secondary | ICD-10-CM | POA: Diagnosis not present

## 2017-05-26 DIAGNOSIS — I5030 Unspecified diastolic (congestive) heart failure: Secondary | ICD-10-CM | POA: Diagnosis not present

## 2017-05-26 DIAGNOSIS — Z993 Dependence on wheelchair: Secondary | ICD-10-CM | POA: Diagnosis not present

## 2017-05-26 DIAGNOSIS — M109 Gout, unspecified: Secondary | ICD-10-CM | POA: Diagnosis not present

## 2017-05-26 DIAGNOSIS — M170 Bilateral primary osteoarthritis of knee: Secondary | ICD-10-CM | POA: Diagnosis not present

## 2017-05-26 DIAGNOSIS — G250 Essential tremor: Secondary | ICD-10-CM | POA: Diagnosis not present

## 2017-06-01 DIAGNOSIS — M170 Bilateral primary osteoarthritis of knee: Secondary | ICD-10-CM | POA: Diagnosis not present

## 2017-06-01 DIAGNOSIS — E1142 Type 2 diabetes mellitus with diabetic polyneuropathy: Secondary | ICD-10-CM | POA: Diagnosis not present

## 2017-06-01 DIAGNOSIS — I5030 Unspecified diastolic (congestive) heart failure: Secondary | ICD-10-CM | POA: Diagnosis not present

## 2017-06-01 DIAGNOSIS — M109 Gout, unspecified: Secondary | ICD-10-CM | POA: Diagnosis not present

## 2017-06-01 DIAGNOSIS — G250 Essential tremor: Secondary | ICD-10-CM | POA: Diagnosis not present

## 2017-06-01 DIAGNOSIS — I110 Hypertensive heart disease with heart failure: Secondary | ICD-10-CM | POA: Diagnosis not present

## 2017-06-03 DIAGNOSIS — E1142 Type 2 diabetes mellitus with diabetic polyneuropathy: Secondary | ICD-10-CM | POA: Diagnosis not present

## 2017-06-03 DIAGNOSIS — I110 Hypertensive heart disease with heart failure: Secondary | ICD-10-CM | POA: Diagnosis not present

## 2017-06-03 DIAGNOSIS — M170 Bilateral primary osteoarthritis of knee: Secondary | ICD-10-CM | POA: Diagnosis not present

## 2017-06-03 DIAGNOSIS — G250 Essential tremor: Secondary | ICD-10-CM | POA: Diagnosis not present

## 2017-06-03 DIAGNOSIS — M109 Gout, unspecified: Secondary | ICD-10-CM | POA: Diagnosis not present

## 2017-06-03 DIAGNOSIS — I5030 Unspecified diastolic (congestive) heart failure: Secondary | ICD-10-CM | POA: Diagnosis not present

## 2017-06-06 DIAGNOSIS — I110 Hypertensive heart disease with heart failure: Secondary | ICD-10-CM | POA: Diagnosis not present

## 2017-06-06 DIAGNOSIS — I5030 Unspecified diastolic (congestive) heart failure: Secondary | ICD-10-CM | POA: Diagnosis not present

## 2017-06-06 DIAGNOSIS — M170 Bilateral primary osteoarthritis of knee: Secondary | ICD-10-CM | POA: Diagnosis not present

## 2017-06-06 DIAGNOSIS — E1142 Type 2 diabetes mellitus with diabetic polyneuropathy: Secondary | ICD-10-CM | POA: Diagnosis not present

## 2017-06-06 DIAGNOSIS — G250 Essential tremor: Secondary | ICD-10-CM | POA: Diagnosis not present

## 2017-06-06 DIAGNOSIS — M109 Gout, unspecified: Secondary | ICD-10-CM | POA: Diagnosis not present

## 2017-06-08 DIAGNOSIS — E1142 Type 2 diabetes mellitus with diabetic polyneuropathy: Secondary | ICD-10-CM | POA: Diagnosis not present

## 2017-06-08 DIAGNOSIS — G250 Essential tremor: Secondary | ICD-10-CM | POA: Diagnosis not present

## 2017-06-08 DIAGNOSIS — M170 Bilateral primary osteoarthritis of knee: Secondary | ICD-10-CM | POA: Diagnosis not present

## 2017-06-08 DIAGNOSIS — I5030 Unspecified diastolic (congestive) heart failure: Secondary | ICD-10-CM | POA: Diagnosis not present

## 2017-06-08 DIAGNOSIS — I110 Hypertensive heart disease with heart failure: Secondary | ICD-10-CM | POA: Diagnosis not present

## 2017-06-08 DIAGNOSIS — M109 Gout, unspecified: Secondary | ICD-10-CM | POA: Diagnosis not present

## 2017-06-10 DIAGNOSIS — I5030 Unspecified diastolic (congestive) heart failure: Secondary | ICD-10-CM | POA: Diagnosis not present

## 2017-06-10 DIAGNOSIS — G250 Essential tremor: Secondary | ICD-10-CM | POA: Diagnosis not present

## 2017-06-10 DIAGNOSIS — M170 Bilateral primary osteoarthritis of knee: Secondary | ICD-10-CM | POA: Diagnosis not present

## 2017-06-10 DIAGNOSIS — E1142 Type 2 diabetes mellitus with diabetic polyneuropathy: Secondary | ICD-10-CM | POA: Diagnosis not present

## 2017-06-10 DIAGNOSIS — I110 Hypertensive heart disease with heart failure: Secondary | ICD-10-CM | POA: Diagnosis not present

## 2017-06-10 DIAGNOSIS — M109 Gout, unspecified: Secondary | ICD-10-CM | POA: Diagnosis not present

## 2017-06-13 DIAGNOSIS — G250 Essential tremor: Secondary | ICD-10-CM | POA: Diagnosis not present

## 2017-06-13 DIAGNOSIS — I110 Hypertensive heart disease with heart failure: Secondary | ICD-10-CM | POA: Diagnosis not present

## 2017-06-13 DIAGNOSIS — M170 Bilateral primary osteoarthritis of knee: Secondary | ICD-10-CM | POA: Diagnosis not present

## 2017-06-13 DIAGNOSIS — I5030 Unspecified diastolic (congestive) heart failure: Secondary | ICD-10-CM | POA: Diagnosis not present

## 2017-06-13 DIAGNOSIS — E1142 Type 2 diabetes mellitus with diabetic polyneuropathy: Secondary | ICD-10-CM | POA: Diagnosis not present

## 2017-06-13 DIAGNOSIS — M109 Gout, unspecified: Secondary | ICD-10-CM | POA: Diagnosis not present

## 2017-06-14 DIAGNOSIS — G250 Essential tremor: Secondary | ICD-10-CM | POA: Diagnosis not present

## 2017-06-14 DIAGNOSIS — I5030 Unspecified diastolic (congestive) heart failure: Secondary | ICD-10-CM | POA: Diagnosis not present

## 2017-06-14 DIAGNOSIS — I110 Hypertensive heart disease with heart failure: Secondary | ICD-10-CM | POA: Diagnosis not present

## 2017-06-14 DIAGNOSIS — M109 Gout, unspecified: Secondary | ICD-10-CM | POA: Diagnosis not present

## 2017-06-14 DIAGNOSIS — M170 Bilateral primary osteoarthritis of knee: Secondary | ICD-10-CM | POA: Diagnosis not present

## 2017-06-14 DIAGNOSIS — E1142 Type 2 diabetes mellitus with diabetic polyneuropathy: Secondary | ICD-10-CM | POA: Diagnosis not present

## 2017-06-16 DIAGNOSIS — I110 Hypertensive heart disease with heart failure: Secondary | ICD-10-CM | POA: Diagnosis not present

## 2017-06-16 DIAGNOSIS — M170 Bilateral primary osteoarthritis of knee: Secondary | ICD-10-CM | POA: Diagnosis not present

## 2017-06-16 DIAGNOSIS — G250 Essential tremor: Secondary | ICD-10-CM | POA: Diagnosis not present

## 2017-06-16 DIAGNOSIS — M109 Gout, unspecified: Secondary | ICD-10-CM | POA: Diagnosis not present

## 2017-06-16 DIAGNOSIS — I5030 Unspecified diastolic (congestive) heart failure: Secondary | ICD-10-CM | POA: Diagnosis not present

## 2017-06-16 DIAGNOSIS — E1142 Type 2 diabetes mellitus with diabetic polyneuropathy: Secondary | ICD-10-CM | POA: Diagnosis not present

## 2017-06-17 DIAGNOSIS — M170 Bilateral primary osteoarthritis of knee: Secondary | ICD-10-CM | POA: Diagnosis not present

## 2017-06-17 DIAGNOSIS — I110 Hypertensive heart disease with heart failure: Secondary | ICD-10-CM | POA: Diagnosis not present

## 2017-06-17 DIAGNOSIS — E1142 Type 2 diabetes mellitus with diabetic polyneuropathy: Secondary | ICD-10-CM | POA: Diagnosis not present

## 2017-06-17 DIAGNOSIS — M109 Gout, unspecified: Secondary | ICD-10-CM | POA: Diagnosis not present

## 2017-06-17 DIAGNOSIS — I5030 Unspecified diastolic (congestive) heart failure: Secondary | ICD-10-CM | POA: Diagnosis not present

## 2017-06-17 DIAGNOSIS — G250 Essential tremor: Secondary | ICD-10-CM | POA: Diagnosis not present

## 2017-06-21 ENCOUNTER — Ambulatory Visit (INDEPENDENT_AMBULATORY_CARE_PROVIDER_SITE_OTHER): Payer: Medicare Other | Admitting: Family Medicine

## 2017-06-21 VITALS — Ht 74.0 in | Wt 182.0 lb

## 2017-06-21 DIAGNOSIS — M109 Gout, unspecified: Secondary | ICD-10-CM | POA: Diagnosis not present

## 2017-06-21 DIAGNOSIS — I5030 Unspecified diastolic (congestive) heart failure: Secondary | ICD-10-CM | POA: Diagnosis not present

## 2017-06-21 DIAGNOSIS — E119 Type 2 diabetes mellitus without complications: Secondary | ICD-10-CM

## 2017-06-21 DIAGNOSIS — E7800 Pure hypercholesterolemia, unspecified: Secondary | ICD-10-CM

## 2017-06-21 DIAGNOSIS — I110 Hypertensive heart disease with heart failure: Secondary | ICD-10-CM | POA: Diagnosis not present

## 2017-06-21 DIAGNOSIS — I10 Essential (primary) hypertension: Secondary | ICD-10-CM

## 2017-06-21 DIAGNOSIS — G609 Hereditary and idiopathic neuropathy, unspecified: Secondary | ICD-10-CM | POA: Diagnosis not present

## 2017-06-21 DIAGNOSIS — M170 Bilateral primary osteoarthritis of knee: Secondary | ICD-10-CM

## 2017-06-21 DIAGNOSIS — G250 Essential tremor: Secondary | ICD-10-CM | POA: Diagnosis not present

## 2017-06-21 DIAGNOSIS — Z79899 Other long term (current) drug therapy: Secondary | ICD-10-CM | POA: Diagnosis not present

## 2017-06-21 DIAGNOSIS — E1142 Type 2 diabetes mellitus with diabetic polyneuropathy: Secondary | ICD-10-CM | POA: Diagnosis not present

## 2017-06-21 DIAGNOSIS — Z13220 Encounter for screening for lipoid disorders: Secondary | ICD-10-CM

## 2017-06-21 LAB — POCT GLYCOSYLATED HEMOGLOBIN (HGB A1C): HEMOGLOBIN A1C: 5.8

## 2017-06-21 NOTE — Progress Notes (Signed)
   Subjective:    Patient ID: Christian Thomas, male    DOB: March 12, 1915, 83 y.o.   MRN: [id number]  Diabetes  He presents for his follow-up diabetic visit. He has type 2 diabetes mellitus. He does not see a podiatrist.Eye exam is not current.  Needs knees injected today. Results for orders placed or performed in visit on 06/21/17  POCT glycosylated hemoglobin (Hb A1C)  Result Value Ref Range   Hemoglobin A1C 5.8    Patient claims compliance with diabetes medication. No obvious side effects. Reports no substantial low sugar spells. Most numbers are generally in good range when checked fasting. Generally does not miss a dose of medication. Watching diabetic diet closely  Blood pressure medicine and blood pressure levels reviewed today with patient. Compliant with blood pressure medicine. States does not miss a dose. No obvious side effects. Blood pressure generally good when checked elsewhere. Watching salt intake.   Patient reports breathing overall stable. Still uses nebulizer most nights. Overall helps his breathing.  Ongoing challenges with neuropathy. States the medicine plus a cream together handle things fairly well next  Ongoing need for knee injections gets them every 3 months. There is no way up orthopedic surgeon would work on him at this point in nature 100 with multiple medical problems  No other concerns Review of Systems No headache, no major weight loss or weight gain, no chest pain no back pain abdominal pain no change in bowel habits complete ROS otherwise negative     Objective:   Physical Exam Alert and oriented, vitals reviewed and stable, NAD ENT-TM's and ext canals WNL bilat via otoscopic exam Soft palate, tonsils and post pharynx WNL via oropharyngeal exam Neck-symmetric, no masses; thyroid nonpalpable and nontender Pulmonary-no tachypnea or accessory muscle use; Clear without wheezes via auscultation Card--no abnrml murmurs, rhythm reg and rate WNL Carotid  pulses symmetric, without bruits  Patient was prepped draped anesthetized both knees. Injected with Depo-Medrol +2 mL of Xylocaine intra-articular particular from a superolateral patellar approach      Assessment & Plan:  Impression 1 type tries good control discussed maintain same meds X  #2 hypertension good control discussed maintain same  #3 asthma clinically stable uses nebulizer daily which for this patient works  #4 Hyperlipidemia prior blood work reviewed current status uncertain  #5 painful neuropathy ongoing, with need for current medications  #6 arthritis severe with need for regular injections next  Await further blood work for additional recommendations

## 2017-06-22 DIAGNOSIS — G250 Essential tremor: Secondary | ICD-10-CM | POA: Diagnosis not present

## 2017-06-22 DIAGNOSIS — M109 Gout, unspecified: Secondary | ICD-10-CM | POA: Diagnosis not present

## 2017-06-22 DIAGNOSIS — I110 Hypertensive heart disease with heart failure: Secondary | ICD-10-CM | POA: Diagnosis not present

## 2017-06-22 DIAGNOSIS — I5030 Unspecified diastolic (congestive) heart failure: Secondary | ICD-10-CM | POA: Diagnosis not present

## 2017-06-22 DIAGNOSIS — M170 Bilateral primary osteoarthritis of knee: Secondary | ICD-10-CM | POA: Diagnosis not present

## 2017-06-22 DIAGNOSIS — E1142 Type 2 diabetes mellitus with diabetic polyneuropathy: Secondary | ICD-10-CM | POA: Diagnosis not present

## 2017-06-22 LAB — BASIC METABOLIC PANEL
BUN/Creatinine Ratio: 18 (ref 10–24)
BUN: 21 mg/dL (ref 10–36)
CALCIUM: 9.5 mg/dL (ref 8.6–10.2)
CHLORIDE: 102 mmol/L (ref 96–106)
CO2: 24 mmol/L (ref 20–29)
Creatinine, Ser: 1.18 mg/dL (ref 0.76–1.27)
GFR calc Af Amer: 58 mL/min/{1.73_m2} — ABNORMAL LOW (ref >59)
GFR, EST NON AFRICAN AMERICAN: 50 mL/min/{1.73_m2} — AB (ref >59)
GLUCOSE: 106 mg/dL — AB (ref 65–99)
POTASSIUM: 5.5 mmol/L — AB (ref 3.5–5.2)
Sodium: 140 mmol/L (ref 134–144)

## 2017-06-22 LAB — HEPATIC FUNCTION PANEL
ALK PHOS: 89 IU/L (ref 39–117)
ALT: 10 IU/L (ref 0–44)
AST: 14 IU/L (ref 0–40)
Albumin: 4.6 g/dL (ref 3.2–4.6)
BILIRUBIN, DIRECT: 0.09 mg/dL (ref 0.00–0.40)
Bilirubin Total: 0.3 mg/dL (ref 0.0–1.2)
TOTAL PROTEIN: 6.6 g/dL (ref 6.0–8.5)

## 2017-06-22 LAB — LIPID PANEL
CHOL/HDL RATIO: 2.4 ratio (ref 0.0–5.0)
CHOLESTEROL TOTAL: 156 mg/dL (ref 100–199)
HDL: 65 mg/dL (ref >39)
LDL CALC: 67 mg/dL (ref 0–99)
Triglycerides: 119 mg/dL (ref 0–149)
VLDL Cholesterol Cal: 24 mg/dL (ref 5–40)

## 2017-06-23 DIAGNOSIS — I110 Hypertensive heart disease with heart failure: Secondary | ICD-10-CM | POA: Diagnosis not present

## 2017-06-23 DIAGNOSIS — M109 Gout, unspecified: Secondary | ICD-10-CM | POA: Diagnosis not present

## 2017-06-23 DIAGNOSIS — E1142 Type 2 diabetes mellitus with diabetic polyneuropathy: Secondary | ICD-10-CM | POA: Diagnosis not present

## 2017-06-23 DIAGNOSIS — I5030 Unspecified diastolic (congestive) heart failure: Secondary | ICD-10-CM | POA: Diagnosis not present

## 2017-06-23 DIAGNOSIS — M170 Bilateral primary osteoarthritis of knee: Secondary | ICD-10-CM | POA: Diagnosis not present

## 2017-06-23 DIAGNOSIS — G250 Essential tremor: Secondary | ICD-10-CM | POA: Diagnosis not present

## 2017-06-23 MED ORDER — METHYLPREDNISOLONE ACETATE 40 MG/ML IJ SUSP: 40.0000 mg | Freq: Once | INTRAMUSCULAR | Status: DC

## 2017-06-24 DIAGNOSIS — G250 Essential tremor: Secondary | ICD-10-CM | POA: Diagnosis not present

## 2017-06-24 DIAGNOSIS — M170 Bilateral primary osteoarthritis of knee: Secondary | ICD-10-CM | POA: Diagnosis not present

## 2017-06-24 DIAGNOSIS — I110 Hypertensive heart disease with heart failure: Secondary | ICD-10-CM | POA: Diagnosis not present

## 2017-06-24 DIAGNOSIS — I5030 Unspecified diastolic (congestive) heart failure: Secondary | ICD-10-CM | POA: Diagnosis not present

## 2017-06-24 DIAGNOSIS — E1142 Type 2 diabetes mellitus with diabetic polyneuropathy: Secondary | ICD-10-CM | POA: Diagnosis not present

## 2017-06-24 DIAGNOSIS — M109 Gout, unspecified: Secondary | ICD-10-CM | POA: Diagnosis not present

## 2017-06-27 ENCOUNTER — Telehealth: Payer: Self-pay | Admitting: Family Medicine

## 2017-06-27 DIAGNOSIS — M170 Bilateral primary osteoarthritis of knee: Secondary | ICD-10-CM | POA: Diagnosis not present

## 2017-06-27 DIAGNOSIS — E1142 Type 2 diabetes mellitus with diabetic polyneuropathy: Secondary | ICD-10-CM | POA: Diagnosis not present

## 2017-06-27 DIAGNOSIS — G250 Essential tremor: Secondary | ICD-10-CM | POA: Diagnosis not present

## 2017-06-27 DIAGNOSIS — I110 Hypertensive heart disease with heart failure: Secondary | ICD-10-CM | POA: Diagnosis not present

## 2017-06-27 DIAGNOSIS — I5030 Unspecified diastolic (congestive) heart failure: Secondary | ICD-10-CM | POA: Diagnosis not present

## 2017-06-27 DIAGNOSIS — M109 Gout, unspecified: Secondary | ICD-10-CM | POA: Diagnosis not present

## 2017-06-27 NOTE — Telephone Encounter (Signed)
Patient is having trouble passing a bowel movement again.  The facility has tried Miralax, prune juice, prunes.  Daughter is requesting Dr. Richardson Landry to give her some advice on what they can try next.

## 2017-06-27 NOTE — Telephone Encounter (Signed)
Advised Christian Thomas -Milk of magnesia thirty cc's now, may repeat in morning if necessary, also two dulcolax tabs soon, may repeat two in morn if necessary maintain two scoops of miralax every day-Christian Thomas verbalized understanding and will call back if further problems.

## 2017-06-27 NOTE — Telephone Encounter (Signed)
Milk of magnesia thirty cc's now, may repeat in morn if necessary, also two dulcolax tabs soon, may repeat two in morn if necessary  mainatia two scoops of miralax every day

## 2017-06-28 ENCOUNTER — Telehealth: Payer: Self-pay | Admitting: Family Medicine

## 2017-06-28 NOTE — Telephone Encounter (Signed)
Order faxed to [facility] for fleets enema x 2- 4 hours apart.

## 2017-06-28 NOTE — Telephone Encounter (Signed)
yest recommendation included interventions this morn?  Call nurses at facility and rec fleets enema two , one four hrs apart, also let joy know we are doing this

## 2017-06-28 NOTE — Telephone Encounter (Signed)
Pt is needing an order of how many dulcolax the pt can take for constipation. Pt has not been in five days. Please send order to [facility].

## 2017-06-28 NOTE — Telephone Encounter (Signed)
Patient had no results with yesterdays recomendations

## 2017-06-28 NOTE — Telephone Encounter (Signed)
[name] notified and verbalized understanding.

## 2017-06-29 ENCOUNTER — Encounter: Payer: Self-pay | Admitting: Family Medicine

## 2017-06-30 DIAGNOSIS — I5030 Unspecified diastolic (congestive) heart failure: Secondary | ICD-10-CM | POA: Diagnosis not present

## 2017-06-30 DIAGNOSIS — G250 Essential tremor: Secondary | ICD-10-CM | POA: Diagnosis not present

## 2017-06-30 DIAGNOSIS — E1142 Type 2 diabetes mellitus with diabetic polyneuropathy: Secondary | ICD-10-CM | POA: Diagnosis not present

## 2017-06-30 DIAGNOSIS — I110 Hypertensive heart disease with heart failure: Secondary | ICD-10-CM | POA: Diagnosis not present

## 2017-06-30 DIAGNOSIS — M170 Bilateral primary osteoarthritis of knee: Secondary | ICD-10-CM | POA: Diagnosis not present

## 2017-06-30 DIAGNOSIS — M109 Gout, unspecified: Secondary | ICD-10-CM | POA: Diagnosis not present

## 2017-07-01 ENCOUNTER — Telehealth: Payer: Self-pay | Admitting: Family Medicine

## 2017-07-01 NOTE — Telephone Encounter (Signed)
Any over the counter medication in his room care center must have order from the doctor ,so he can continue to use it. He has a oilment for his bottom that he uses.

## 2017-07-01 NOTE — Telephone Encounter (Signed)
OTC ointment patient uses for bottom

## 2017-07-03 NOTE — Telephone Encounter (Signed)
Ok wis 

## 2017-07-04 ENCOUNTER — Emergency Department (HOSPITAL_COMMUNITY)
Admission: EM | Admit: 2017-07-04 | Discharge: 2017-07-05 | Disposition: A | Discharge status: Home / Self Care | Payer: Medicare Other | Attending: Emergency Medicine | Admitting: Emergency Medicine

## 2017-07-04 ENCOUNTER — Encounter (HOSPITAL_COMMUNITY): Payer: Self-pay | Admitting: *Deleted

## 2017-07-04 ENCOUNTER — Emergency Department (HOSPITAL_COMMUNITY): Payer: Medicare Other

## 2017-07-04 DIAGNOSIS — S14102A Unspecified injury at C2 level of cervical spinal cord, initial encounter: Secondary | ICD-10-CM | POA: Diagnosis not present

## 2017-07-04 DIAGNOSIS — S064X0A Epidural hemorrhage without loss of consciousness, initial encounter: Secondary | ICD-10-CM | POA: Diagnosis not present

## 2017-07-04 DIAGNOSIS — T148XXA Other injury of unspecified body region, initial encounter: Secondary | ICD-10-CM | POA: Diagnosis not present

## 2017-07-04 DIAGNOSIS — Z87891 Personal history of nicotine dependence: Secondary | ICD-10-CM | POA: Insufficient documentation

## 2017-07-04 DIAGNOSIS — S0080XA Unspecified superficial injury of other part of head, initial encounter: Secondary | ICD-10-CM | POA: Diagnosis not present

## 2017-07-04 DIAGNOSIS — J449 Chronic obstructive pulmonary disease, unspecified: Secondary | ICD-10-CM | POA: Diagnosis not present

## 2017-07-04 DIAGNOSIS — Z23 Encounter for immunization: Secondary | ICD-10-CM | POA: Insufficient documentation

## 2017-07-04 DIAGNOSIS — Y999 Unspecified external cause status: Secondary | ICD-10-CM | POA: Insufficient documentation

## 2017-07-04 DIAGNOSIS — Z79899 Other long term (current) drug therapy: Secondary | ICD-10-CM | POA: Diagnosis not present

## 2017-07-04 DIAGNOSIS — I509 Heart failure, unspecified: Secondary | ICD-10-CM | POA: Diagnosis not present

## 2017-07-04 DIAGNOSIS — S064XAA Epidural hemorrhage with loss of consciousness status unknown, initial encounter: Secondary | ICD-10-CM

## 2017-07-04 DIAGNOSIS — W1830XA Fall on same level, unspecified, initial encounter: Secondary | ICD-10-CM | POA: Diagnosis not present

## 2017-07-04 DIAGNOSIS — I110 Hypertensive heart disease with heart failure: Secondary | ICD-10-CM | POA: Insufficient documentation

## 2017-07-04 DIAGNOSIS — S6992XA Unspecified injury of left wrist, hand and finger(s), initial encounter: Secondary | ICD-10-CM | POA: Diagnosis not present

## 2017-07-04 DIAGNOSIS — S064X9A Epidural hemorrhage with loss of consciousness of unspecified duration, initial encounter: Secondary | ICD-10-CM

## 2017-07-04 DIAGNOSIS — S0990XA Unspecified injury of head, initial encounter: Secondary | ICD-10-CM | POA: Diagnosis not present

## 2017-07-04 DIAGNOSIS — Y929 Unspecified place or not applicable: Secondary | ICD-10-CM | POA: Diagnosis not present

## 2017-07-04 DIAGNOSIS — S199XXA Unspecified injury of neck, initial encounter: Secondary | ICD-10-CM | POA: Diagnosis not present

## 2017-07-04 DIAGNOSIS — Y939 Activity, unspecified: Secondary | ICD-10-CM | POA: Diagnosis not present

## 2017-07-04 DIAGNOSIS — E119 Type 2 diabetes mellitus without complications: Secondary | ICD-10-CM | POA: Diagnosis not present

## 2017-07-04 DIAGNOSIS — Z8673 Personal history of transient ischemic attack (TIA), and cerebral infarction without residual deficits: Secondary | ICD-10-CM | POA: Diagnosis not present

## 2017-07-04 DIAGNOSIS — S0001XA Abrasion of scalp, initial encounter: Secondary | ICD-10-CM

## 2017-07-04 DIAGNOSIS — R279 Unspecified lack of coordination: Secondary | ICD-10-CM | POA: Diagnosis not present

## 2017-07-04 DIAGNOSIS — S12001A Unspecified nondisplaced fracture of first cervical vertebra, initial encounter for closed fracture: Secondary | ICD-10-CM | POA: Diagnosis not present

## 2017-07-04 DIAGNOSIS — M542 Cervicalgia: Secondary | ICD-10-CM | POA: Insufficient documentation

## 2017-07-04 DIAGNOSIS — S12112A Nondisplaced Type II dens fracture, initial encounter for closed fracture: Secondary | ICD-10-CM | POA: Diagnosis not present

## 2017-07-04 DIAGNOSIS — S098XXA Other specified injuries of head, initial encounter: Secondary | ICD-10-CM | POA: Diagnosis present

## 2017-07-04 DIAGNOSIS — S12100A Unspecified displaced fracture of second cervical vertebra, initial encounter for closed fracture: Secondary | ICD-10-CM | POA: Diagnosis not present

## 2017-07-04 DIAGNOSIS — R220 Localized swelling, mass and lump, head: Secondary | ICD-10-CM | POA: Diagnosis not present

## 2017-07-04 DIAGNOSIS — Z7984 Long term (current) use of oral hypoglycemic drugs: Secondary | ICD-10-CM | POA: Insufficient documentation

## 2017-07-04 DIAGNOSIS — Z743 Need for continuous supervision: Secondary | ICD-10-CM | POA: Diagnosis not present

## 2017-07-04 DIAGNOSIS — M79642 Pain in left hand: Secondary | ICD-10-CM | POA: Diagnosis not present

## 2017-07-04 DIAGNOSIS — M25512 Pain in left shoulder: Secondary | ICD-10-CM | POA: Diagnosis not present

## 2017-07-04 MED ORDER — BACITRACIN ZINC 500 UNIT/GM EX OINT
TOPICAL_OINTMENT | CUTANEOUS | Status: AC
  Filled 2017-07-04: qty 0.9

## 2017-07-04 MED ORDER — TETANUS-DIPHTH-ACELL PERTUSSIS 5-2.5-18.5 LF-MCG/0.5 IM SUSP
0.5000 mL | Freq: Once | INTRAMUSCULAR | Status: AC
  Administered 2017-07-04: 0.5 mL via INTRAMUSCULAR
  Filled 2017-07-04: qty 0.5

## 2017-07-04 NOTE — ED Triage Notes (Signed)
Pt arrived from [place] by EMS. Pt fell from standing position hitting head. Knot on head noted & per EMS pt has a skin tear to shoulder.

## 2017-07-04 NOTE — Telephone Encounter (Signed)
Prescription faxed to Nursing center.

## 2017-07-04 NOTE — Telephone Encounter (Signed)
Prescription wrote out for Desitin cream. Awaiting signature.

## 2017-07-04 NOTE — ED Provider Notes (Signed)
[facility] DEPT Provider Note   CSN: [id number] Arrival date & time: 07/04/17  6948     History   Chief Complaint Chief Complaint  Patient presents with  . Fall    HPI Christian Thomas is a 83 y.o. male.  HPI Patient coming from [facility] for fall from standing. States he lost his balance and fell forward striking his head on the floor. Denies loss of consciousness. Complains of frontal headache and posterior neck pain. Denies any focal weakness.He does complain about some numbness to the left hand since the fall. Placed in cervical collar by EMS. 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 He is not on blood thinners. Past Medical History:  Diagnosis Date  . Benign essential tremor   . BPH (benign prostatic hypertrophy)   . Cerebellar stroke syndrome   . CHF (congestive heart failure) ([place])   . Chronic constipation   . Clostridium difficile colitis 12/2008  . DDD (degenerative disc disease), lumbar   . Diabetic neuropathy ([place])   . Diverticulosis   . Dysrhythmia   . Essential hypertension, benign   . Gout   . Macular degeneration   . Mixed hyperlipidemia   . Stroke ([place])   . Type 2 diabetes mellitus ([place])   . Venous insufficiency     Patient Active Problem List   Diagnosis Date Noted  . Hypoxia 08/10/2016  . COPD exacerbation ([place]) with hypoxia and wheezy bronchitis 08/10/2016  . Asthma with acute exacerbation 03/03/2016  . Degenerative arthritis of knee, bilateral 12/10/2014  . GI bleeding 12/14/2013  . Accelerated hypertension 12/14/2013  . Anemia due to blood loss, acute 12/14/2013  . Dizziness and giddiness 08/20/2013  . Cerebellar stroke syndrome 08/20/2013  . Hereditary and idiopathic peripheral neuropathy 06/15/2013  . Prostate hypertrophy 06/15/2013  . DM2 (diabetes mellitus, type 2) ([place]) 11/05/2012  . Femoral neck fracture ([place]) 11/05/2012  . Constipation 11/05/2012  . Syncope 07/07/2011  . Abnormal ECG 07/07/2011  .  Essential hypertension, benign 07/07/2011    Past Surgical History:  Procedure Laterality Date  . APPENDECTOMY    . COLONOSCOPY     NL TI, pan-TICS, sml IH, CDIFF COLITIS  . COLONOSCOPY N/A 12/15/2013   Procedure: Colonoscopy and possible EGD;  Surgeon: Rogene Houston, MD;  Location: [facility] ENDO SUITE;  Service: Endoscopy;  Laterality: N/A;  . HEMORRHOID SURGERY    . HERNIA REPAIR    . HIP ARTHROPLASTY  11/07/2012   Procedure: ARTHROPLASTY BIPOLAR HIP;  Surgeon: Mauri Pole, MD;  Location: [facility] ORS;  Service: Orthopedics;  Laterality: Right;  . SIGMOIDOSCOPY     Internal hemorrhoids       Home Medications    Prior to Admission medications   Medication Sig Start Date End Date Taking? Authorizing Provider  acetaminophen (TYLENOL) 325 MG tablet Take 650 mg by mouth every 4 (four) hours as needed.   Yes [provider]  albuterol (PROVENTIL HFA;VENTOLIN HFA) 108 (90 Base) MCG/ACT inhaler Inhale 2 puffs into the lungs every 6 (six) hours as needed for wheezing or shortness of breath. 06/07/16  Yes Mikey Kirschner, MD  allopurinol (ZYLOPRIM) 300 MG tablet TAKE 1 TABLET BY MOUTH ONCE DAILY FOR GOUT. 06/07/16  Yes Mikey Kirschner, MD  ALPRAZolam Duanne Moron) 0.25 MG tablet Take 1 tablet at bedtime as needed for anxiety and itching. 03/02/17  Yes Mikey Kirschner, MD  amLODipine (NORVASC) 5 MG tablet Take 1 tablet (5 mg total) by mouth daily. 06/07/16  Yes Mikey Kirschner, MD  calcium carbonate (TUMS - DOSED IN MG ELEMENTAL CALCIUM) 500 MG chewable tablet Chew 2 tablets by mouth every 6 (six) hours as needed for indigestion or heartburn.   Yes [provider]  capsaicin (ZOSTRIX) 0.025 % cream Apply 1 application topically 2 (two) times daily as needed (apply to left ankle and feet as needed).   Yes [provider]  Diphenhyd-Hydrocort-Nystatin (FIRST-DUKES MOUTHWASH) SUSP One tablespoon swish and gargle qid  prn Patient taking differently: Take 15 mLs by mouth 4 (four)  times daily as needed. One tablespoon swish and gargle as needed 12/03/15  Yes Mikey Kirschner, MD  diphenhydrAMINE (BENADRYL) 25 MG tablet Take 25 mg by mouth every 6 (six) hours as needed for itching.   Yes [provider]  enalapril (VASOTEC) 10 MG tablet TAKE (1) TABLET BY MOUTH TWICE DAILY. 06/07/16  Yes Mikey Kirschner, MD  finasteride (PROSCAR) 5 MG tablet Take 1 tablet (5 mg total) by mouth daily. 06/07/16  Yes Mikey Kirschner, MD  glipiZIDE (GLUCOTROL XL) 2.5 MG 24 hr tablet Take 1 tablet (2.5 mg total) by mouth daily with breakfast. 06/07/16  Yes Mikey Kirschner, MD  hydrOXYzine (ATARAX/VISTARIL) 25 MG tablet TAKE ONE TABLET BY MOUTH AT BEDTIME AS NEEDED ITCHING/SLEEP. Patient taking differently: TAKE ONE TABLET BY MOUTH AT BEDTIME 01/02/15  Yes Mikey Kirschner, MD  INSTA-GLUCOSE 77.4 % GEL GIVE 1 TUBE BY MOUTH IF B/S <60; WAIT 15 MINUTES THEN RECHECK. 02/10/15  Yes Mikey Kirschner, MD  lactulose (CHRONULAC) 10 GM/15ML solution TAKE 2 TABLESPOONFULS (30ML) BY MOUTH ONCE DAILY. 08/21/14  Yes Mikey Kirschner, MD  loratadine (CLARITIN) 10 MG tablet TAKE 1 TABLET BY MOUTH ONCE DAILY FOR ALLERGIC RHINITIS. 09/02/14  Yes Luking, Elayne Snare, MD  lovastatin (MEVACOR) 40 MG tablet TAKE (1) TABLET BY MOUTH AT BEDTIME FOR CHOLESTEROL. 06/07/16  Yes Mikey Kirschner, MD  magnesium hydroxide (MILK OF MAGNESIA) 400 MG/5ML suspension Take 30 mLs by mouth daily as needed for mild constipation.   Yes [provider]  meclizine (ANTIVERT) 25 MG tablet Take 25 mg by mouth every 6 (six) hours as needed for dizziness.   Yes [provider]  Menthol, Topical Analgesic, (BIOFREEZE EX) Apply 1 application topically 4 (four) times daily as needed (for pain).   Yes [provider]  metFORMIN (GLUCOPHAGE) 500 MG tablet Take 1 tablet (500 mg total) by mouth 2 (two) times daily. 06/07/16  Yes Mikey Kirschner, MD  nortriptyline (PAMELOR) 10 MG capsule Take 1 capsule (10 mg total)  by mouth at bedtime. For neuropathic pain 12/22/16  Yes Mikey Kirschner, MD  Olopatadine HCl 0.2 % SOLN One drop each eye daily 03/16/17  Yes Mikey Kirschner, MD  omeprazole (PRILOSEC) 20 MG capsule Take 1 capsule (20 mg total) by mouth 2 (two) times daily before a meal. 06/07/16  Yes Mikey Kirschner, MD  polyethylene glycol powder (GLYCOLAX/MIRALAX) powder Take 17 g by mouth daily.   Yes [provider]  Q-PAP 500 MG tablet TAKE 2 TABLETS BY MOUTH TWICE DAILY. 02/05/15  Yes Mikey Kirschner, MD  QC ANTACID/ANTI-GAS [phone] MG/5ML suspension TAKE 2 TABLESPOONFULS (1ml) BY MOUTH EACH EVENING. 02/10/15  Yes Mikey Kirschner, MD  senna (SENOKOT) 8.6 MG TABS tablet TAKE 2 TABLETS BY MOUTH AT BEDTIME. 12/26/14  Yes Mikey Kirschner, MD  SORE THROAT LIQD USE 5 SPRAYS IN THROAT EVERY 2 HOURS AS NEEDED FOR SORE THROAT. 01/08/15  Yes Mikey Kirschner, MD  Spacer/Aero-Holding Chambers (AEROCHAMBER MINI CHAMBER) DEVI Use as directed 09/01/15  Yes Mikey Kirschner, MD  traMADol (ULTRAM) 50 MG tablet TAKE 1 TABLET BY MOUTH QID  AS NEEDED FOR PAIN. Patient taking differently: Take 50 mg by mouth 4 (four) times daily as needed for moderate pain. TAKE 1 TABLET BY MOUTH QID  AS NEEDED FOR PAIN. 04/26/17  Yes Mikey Kirschner, MD  traZODone (DESYREL) 50 MG tablet One qhs Patient taking differently: Take 50 mg by mouth at bedtime. One qhs 04/26/17  Yes Mikey Kirschner, MD  albuterol (PROVENTIL) (2.5 MG/3ML) 0.083% nebulizer solution Take 3 mLs (2.5 mg total) by nebulization every 4 (four) hours as needed for wheezing or shortness of breath. 04/04/17   Mikey Kirschner, MD    Family History Family History  Problem Relation Age of Onset  . Cancer Mother        Breast    Social History Social History  Substance Use Topics  . Smoking status: Former Smoker    Packs/day: 0.50    Years: 54.00    Types: Cigarettes    Quit date: 11/19/1992  . Smokeless tobacco: Never Used  . Alcohol use Yes      Comment: Rare     Allergies   Morphine and related and Other   Review of Systems Review of Systems  Constitutional: Negative for chills and fever.  HENT: Negative for facial swelling.   Eyes: Negative for visual disturbance.  Respiratory: Negative for shortness of breath.   Cardiovascular: Negative for chest pain, palpitations and leg swelling.  Gastrointestinal: Positive for constipation. Negative for abdominal pain, diarrhea, nausea and vomiting.  Musculoskeletal: Positive for gait problem and neck pain. Negative for back pain.  Skin: Positive for wound.  Neurological: Positive for headaches. Negative for dizziness, syncope, weakness, light-headedness and numbness.  All other systems reviewed and are negative.    Physical Exam Updated Vital Signs BP (!) 168/88 (BP Location: Right Arm)   Pulse 75   Temp 98.7 F (37.1 C) (Oral)   Resp 20   Ht 6\' 2"  (1.88 m)   Wt 81.6 kg (180 lb)   SpO2 95%   BMI 23.11 kg/m   Physical Exam  Constitutional: He is oriented to person, place, and time. He appears well-developed and well-nourished. No distress.  Hard of hearing  HENT:  Head: Normocephalic and atraumatic.  Mouth/Throat: Oropharynx is clear and moist. No oropharyngeal exudate.  Patient with full-thickness abrasion to the left forehead. No underlying bony abnormality. No active bleeding. Midface is stable.  Eyes: Pupils are equal, round, and reactive to light. EOM are normal.  Neck:  Patient has midline cervical tenderness in the superior cervical spine.  Cardiovascular: Normal rate and regular rhythm.  Exam reveals no gallop and no friction rub.   No murmur heard. Pulmonary/Chest: Effort normal and breath sounds normal. No respiratory distress. He has no wheezes. He has no rales. He exhibits no tenderness.  Abdominal: Soft. Bowel sounds are normal. There is no tenderness. There is no rebound and no guarding.  Musculoskeletal: Normal range of motion. He exhibits no edema or  tenderness.  Pelvis is stable. Distal pulses intact.  Neurological: He is alert and oriented to person, place, and time.  Questional mild left grip strength weakness compared to right. 5/5 motor in bilateral lower extremities. Patient does have some decreased sensation to light touch over the third and fourth digits of the left hand.  Skin: Skin is  warm and dry. Capillary refill takes less than 2 seconds. No rash noted. No erythema.  Psychiatric: He has a normal mood and affect. His behavior is normal.  Nursing note and vitals reviewed.    ED Treatments / Results  Labs (all labs ordered are listed, but only abnormal results are displayed) Labs Reviewed - No data to display  EKG  EKG Interpretation None       Radiology Ct Head Wo Contrast  Result Date: 07/04/2017 CLINICAL DATA:  Fall, head trauma, ataxia EXAM: CT HEAD WITHOUT CONTRAST CT CERVICAL SPINE WITHOUT CONTRAST TECHNIQUE: Multidetector CT imaging of the head and cervical spine was performed following the standard protocol without intravenous contrast. Multiplanar CT image reconstructions of the cervical spine were also generated. COMPARISON:  CT head dated 08/19/2013 FINDINGS: CT HEAD FINDINGS Brain: No evidence of acute infarction, hemorrhage, hydrocephalus, extra-axial collection or mass lesion/mass effect. Old small bilateral cerebellar infarcts, right greater than left. Subcortical white matter and periventricular small vessel ischemic changes. Vascular: Intracranial atherosclerosis. Skull: Normal. Negative for fracture or focal lesion. Sinuses/Orbits: The visualized paranasal sinuses are essentially clear. The mastoid air cells are unopacified. Other: Soft tissue swelling overlying the left frontal bone (series 3/ image 26). CT CERVICAL SPINE FINDINGS Alignment: Normal cervical lordosis. Skull base and vertebrae: No acute fracture. No primary bone lesion or focal pathologic process. Soft tissues and spinal canal: No prevertebral  fluid or swelling. No visible canal hematoma. Disc levels:  Mild to moderate multilevel degenerative changes. Spinal canal is patent. Upper chest: Visualized lung apices are notable for mild biapical pleural-parenchymal scarring. Other: Visualized thyroid is grossly unremarkable. IMPRESSION: Soft tissue swelling overlying the left frontal bone. No evidence of calvarial fracture. No evidence of acute intracranial abnormality. Small vessel ischemic changes. Old small bilateral cerebellar infarcts, right greater than left. No evidence of traumatic injury to the cervical spine. Mild to moderate multilevel degenerative changes. Electronically Signed   By: Julian Hy M.D.   On: 07/04/2017 20:21   Ct Cervical Spine Wo Contrast  Result Date: 07/04/2017 CLINICAL DATA:  Fall, head trauma, ataxia EXAM: CT HEAD WITHOUT CONTRAST CT CERVICAL SPINE WITHOUT CONTRAST TECHNIQUE: Multidetector CT imaging of the head and cervical spine was performed following the standard protocol without intravenous contrast. Multiplanar CT image reconstructions of the cervical spine were also generated. COMPARISON:  CT head dated 08/19/2013 FINDINGS: CT HEAD FINDINGS Brain: No evidence of acute infarction, hemorrhage, hydrocephalus, extra-axial collection or mass lesion/mass effect. Old small bilateral cerebellar infarcts, right greater than left. Subcortical white matter and periventricular small vessel ischemic changes. Vascular: Intracranial atherosclerosis. Skull: Normal. Negative for fracture or focal lesion. Sinuses/Orbits: The visualized paranasal sinuses are essentially clear. The mastoid air cells are unopacified. Other: Soft tissue swelling overlying the left frontal bone (series 3/ image 26). CT CERVICAL SPINE FINDINGS Alignment: Normal cervical lordosis. Skull base and vertebrae: No acute fracture. No primary bone lesion or focal pathologic process. Soft tissues and spinal canal: No prevertebral fluid or swelling. No visible  canal hematoma. Disc levels:  Mild to moderate multilevel degenerative changes. Spinal canal is patent. Upper chest: Visualized lung apices are notable for mild biapical pleural-parenchymal scarring. Other: Visualized thyroid is grossly unremarkable. IMPRESSION: Soft tissue swelling overlying the left frontal bone. No evidence of calvarial fracture. No evidence of acute intracranial abnormality. Small vessel ischemic changes. Old small bilateral cerebellar infarcts, right greater than left. No evidence of traumatic injury to the cervical spine. Mild to moderate multilevel degenerative changes. Electronically Signed   By:  Julian Hy M.D.   On: 07/04/2017 20:21   Dg Hand Complete Left  Result Date: 07/04/2017 CLINICAL DATA:  Pain after fall. EXAM: LEFT HAND - COMPLETE 3+ VIEW COMPARISON:  None. FINDINGS: There is unusual positioning of the fifth finger with flexion at the proximal interphalangeal joint. No definitive fractures are seen in this location on imaging. There is a calcification adjacent to the distal radius which has a well corticated chronic appearance. No other evidence of fracture. IMPRESSION: 1. Unusual positioning of the fifth finger with flexion at the proximal interphalangeal joint. No fracture is identified in this region however. Electronically Signed   By: Dorise Bullion III M.D   On: 07/04/2017 20:30    Procedures Procedures (including critical care time)  Medications Ordered in ED Medications  Tdap (BOOSTRIX) injection 0.5 mL (0.5 mLs Intramuscular Given 07/04/17 2033)     Initial Impression / Assessment and Plan / ED Course  I have reviewed the triage vital signs and the nursing notes.  Pertinent labs & imaging results that were available during my care of the patient were reviewed by me and considered in my medical decision making (see chart for details).    Given posterior cervical spine tenderness with new neurologic changes to the left upper extremity, we'll  transfer patient to risk and emergency department to get MRI to rule out soft tissue injury. Discussed with Dr. Rex Kras who has accepted patient transfer. Tetanus is been updated.   Final Clinical Impressions(s) / ED Diagnoses   Final diagnoses:  Abrasion of scalp, initial encounter  Injury of head, initial encounter  Posterior neck pain    New Prescriptions New Prescriptions   No medications on file     Julianne Rice, MD 07/04/17 2108

## 2017-07-04 NOTE — ED Notes (Signed)
Received report from [facility]. EMS.  Pt is transfer for [facility] for an MRI.  Pt fell at his nursing home [facility]) today.  Pt hit head.  Had hematoma to L forehead.  C/o pain in back of head and neck that has increased since leaving [facility].  MRI needed due to pt's complaint of numbness and tingling in hands.  12-lead shows RBBB.  VSS.  EMS did IV.

## 2017-07-05 ENCOUNTER — Emergency Department (HOSPITAL_COMMUNITY): Payer: Medicare Other

## 2017-07-05 ENCOUNTER — Telehealth: Payer: Self-pay | Admitting: Family Medicine

## 2017-07-05 DIAGNOSIS — S12100A Unspecified displaced fracture of second cervical vertebra, initial encounter for closed fracture: Secondary | ICD-10-CM | POA: Diagnosis not present

## 2017-07-05 DIAGNOSIS — S0001XA Abrasion of scalp, initial encounter: Secondary | ICD-10-CM | POA: Diagnosis not present

## 2017-07-05 DIAGNOSIS — G8911 Acute pain due to trauma: Secondary | ICD-10-CM | POA: Diagnosis not present

## 2017-07-05 NOTE — Telephone Encounter (Signed)
Spoke with Amy at Physical Therapy at [facility] and gave verbal orders per Dr.Steve Luking- for physical therapy 2x a week starting next week for a C2 fracture. Amy verbalized understanding.

## 2017-07-05 NOTE — Telephone Encounter (Signed)
Received a phone call from Amy with PT at [facility]. Patient had a fall and fractured C2. Amy would like to extend physical therapy for 2x a week starting next.   [phone]- Amy PT at [facility]

## 2017-07-05 NOTE — ED Notes (Signed)
Patient transported to MRI 

## 2017-07-05 NOTE — ED Notes (Signed)
Report called to [facility] staff.

## 2017-07-05 NOTE — ED Notes (Addendum)
Pt's L head and L shoulder dressed with bacitracin, non-adherent dressing, and gauze.   Pt ambulated with walker with even, steady gait, at his baseline.  Pt uses walker at facility.

## 2017-07-05 NOTE — ED Notes (Signed)
Adjusted pt's C-collar; as he sts he cannot breathe with it on.  Lung sounds clear.  Pt sts he can't breathe through his nose.  Have repositioned HOB at this time and will continue to monitor.

## 2017-07-05 NOTE — Discharge Instructions (Signed)
You were seen in the emergency department after you had a fall. You have a closed, nondisplaced type II C2 fracture. We have consulted Dr. Arnoldo Morale with neurosurgery. He recommends that you stay in an Aspen collar and follow-up with your primary care physician. You do not need to follow-up with neurosurgery as an outpatient but we have given you his number if you have any concerns. You need to wear cervical collar at all times. You may take Tylenol 1000 mg every 6 hours as needed for pain. I recommended she do of weighted aspirin, ibuprofen, Aleve at this time. You may continue your tramadol but is already prescribed as needed for pain. If you develop numbness, tingling or weakness on one side of your body, difficulty holding her bowel or bladder, are unable to walk, worsening headache or neck pain, confusion, please return to the emergency department.

## 2017-07-05 NOTE — ED Notes (Signed)
[facility] will be able to transport pt to [place] [facility].

## 2017-07-05 NOTE — Telephone Encounter (Signed)
Ok may do 

## 2017-07-05 NOTE — ED Provider Notes (Addendum)
12:00 AM  Assumed care from Dr. Rex Kras and Dr. Lita Mains.  Patient is a 83 year old male who had a mechanical fall. He is transferred from outside hospital for MRI of the cervical spine. Head CT of his head and cervical spine with no acute abnormality. While being monitored he developed left hand numbness and decreased grip strength. Getting MRI to evaluate for further injury.   Patient lives at [place]. He reports that he was in his wheelchair and was leaning forward to change his pajamas when he fell out of the wheelchair striking his head. Denies loss of consciousness. He is not on antiplatelets or anticoagulants. Uses a walker at baseline.  2:18 AM  D/w Dr. Arnoldo Morale with neurosurgery. Appreciate his help. Patient does have an acute nondisplaced C2 fracture at the base of the dens, type II. There is also a C1-C2 ventral epidural hematoma without canal stenosis. At this time patient's neurologic deficits have resolved and he is neurologically intact. Given patient's age and multiple comorbidities, patient would not be a good surgical candidate. Dr. Arnoldo Morale recommends placing patient in a Aspen collar have him follow-up with his outpatient primary care physician. Patient is not on antiplatelets or anticoagulants. Discussed at length return precautions. Recommended Tylenol for pain control. Patient and family are comfortable with this plan. All questions have been answered.  3:25 AM  Pt able to angulate you're using walker without any difficulty. Will discharge back to his nursing facility with [facility].   I reviewed all nursing notes, vitals, pertinent previous records, EKGs, lab and urine results, imaging (as available).    Blossie Raffel, Delice Bison, DO 07/05/17 [place], Delice Bison, DO 07/05/17 [place], Delice Bison, DO 07/05/17 8250

## 2017-07-06 ENCOUNTER — Encounter: Payer: Self-pay | Admitting: Family Medicine

## 2017-07-06 ENCOUNTER — Ambulatory Visit (INDEPENDENT_AMBULATORY_CARE_PROVIDER_SITE_OTHER): Payer: Medicare Other | Admitting: Family Medicine

## 2017-07-06 VITALS — Ht 72.0 in | Wt 182.0 lb

## 2017-07-06 DIAGNOSIS — S12100A Unspecified displaced fracture of second cervical vertebra, initial encounter for closed fracture: Secondary | ICD-10-CM | POA: Diagnosis not present

## 2017-07-06 DIAGNOSIS — W0110XD Fall on same level from slipping, tripping and stumbling with subsequent striking against unspecified object, subsequent encounter: Secondary | ICD-10-CM

## 2017-07-06 NOTE — Progress Notes (Signed)
   Subjective:    Patient ID: Christian Thomas, male    DOB: 07-29-1915, 83 y.o.   MRN: [id number]  HPI  Patient arrives for a follow up on a recent ER visit for neck fracture s/p fall  Complete hospital record reviewed in presence of patient. Full emergency room note/ER doctor notes/neurosurgery input/MRI reports scans all reviewed at length.  Patient took a fall. CT scan of head fortunately revealed no bleed. Had substantial neck pain. MRI revealed a C2 stable fracture.  Neurosurgery was discussed. They looked scan felt patient's age he did be too much of a risk.  Patient did have transient tingling left hand reports this has now gone.  Some having some pain but tramadol overall health unit  Review of Systems No headache, no major weight loss or weight gain, no chest pain no back pain abdominal pain no change in bowel habits complete ROS otherwise negative     Objective:   Physical Exam Alert and oriented, vitals reviewed and stable, NAD ENT-Bilateral "black eyes" positive forehead abrasion. Neck brace intact. Otherwise TM's and ext canals WNL bilat via otoscopic exam Soft palate, tonsils and post pharynx WNL via oropharyngeal exam Neck-symmetric, no masses; thyroid nonpalpable and nontender Pulmonary-no tachypnea or accessory muscle use; Clear without wheezes via auscultation Card--no abnrml murmurs, rhythm reg and rate WNL Carotid pulses symmetric, without bruits  Distal hands sensation strength coordination all intact      Assessment & Plan:  Impression C2 fracture. Deemed nonsurgical. I feel this is reasonable. Patient would be an extraordinarily high surgery risk with his many comorbidities. To wear the brace 3 weeks. Symptom care discussed. I will touch base with patient in several weeks to see with the neurosurgeons next recommendation is. Symptom control discussed important not to fall. Proper transfer/immobilization discussed use wheelchair and walker

## 2017-07-07 DIAGNOSIS — I5030 Unspecified diastolic (congestive) heart failure: Secondary | ICD-10-CM | POA: Diagnosis not present

## 2017-07-07 DIAGNOSIS — E1142 Type 2 diabetes mellitus with diabetic polyneuropathy: Secondary | ICD-10-CM | POA: Diagnosis not present

## 2017-07-07 DIAGNOSIS — M109 Gout, unspecified: Secondary | ICD-10-CM | POA: Diagnosis not present

## 2017-07-07 DIAGNOSIS — G250 Essential tremor: Secondary | ICD-10-CM | POA: Diagnosis not present

## 2017-07-07 DIAGNOSIS — I110 Hypertensive heart disease with heart failure: Secondary | ICD-10-CM | POA: Diagnosis not present

## 2017-07-07 DIAGNOSIS — M170 Bilateral primary osteoarthritis of knee: Secondary | ICD-10-CM | POA: Diagnosis not present

## 2017-07-11 DIAGNOSIS — I110 Hypertensive heart disease with heart failure: Secondary | ICD-10-CM | POA: Diagnosis not present

## 2017-07-11 DIAGNOSIS — E1142 Type 2 diabetes mellitus with diabetic polyneuropathy: Secondary | ICD-10-CM | POA: Diagnosis not present

## 2017-07-11 DIAGNOSIS — M109 Gout, unspecified: Secondary | ICD-10-CM | POA: Diagnosis not present

## 2017-07-11 DIAGNOSIS — G250 Essential tremor: Secondary | ICD-10-CM | POA: Diagnosis not present

## 2017-07-11 DIAGNOSIS — I5030 Unspecified diastolic (congestive) heart failure: Secondary | ICD-10-CM | POA: Diagnosis not present

## 2017-07-11 DIAGNOSIS — M170 Bilateral primary osteoarthritis of knee: Secondary | ICD-10-CM | POA: Diagnosis not present

## 2017-07-12 ENCOUNTER — Telehealth: Payer: Self-pay | Admitting: Family Medicine

## 2017-07-12 NOTE — Telephone Encounter (Signed)
incr xanax to 0.25 mg bid, fleets enema times two

## 2017-07-12 NOTE — Telephone Encounter (Signed)
Orders faxed to [facility]-confirmed receipt of orders by Sharyn Lull at [facility].

## 2017-07-12 NOTE — Telephone Encounter (Signed)
Patient is having a lot of anxiety, saying things like he doesn't want to live, etc.  His daughter is really concerned.  She is requesting that Dr. Richardson Landry increase the dosage on his xanax until further notice.  Also, would like order for enema because he is having trouble using the bathroom again.

## 2017-07-14 DIAGNOSIS — M109 Gout, unspecified: Secondary | ICD-10-CM | POA: Diagnosis not present

## 2017-07-14 DIAGNOSIS — M170 Bilateral primary osteoarthritis of knee: Secondary | ICD-10-CM | POA: Diagnosis not present

## 2017-07-14 DIAGNOSIS — E1142 Type 2 diabetes mellitus with diabetic polyneuropathy: Secondary | ICD-10-CM | POA: Diagnosis not present

## 2017-07-14 DIAGNOSIS — I5030 Unspecified diastolic (congestive) heart failure: Secondary | ICD-10-CM | POA: Diagnosis not present

## 2017-07-14 DIAGNOSIS — G250 Essential tremor: Secondary | ICD-10-CM | POA: Diagnosis not present

## 2017-07-14 DIAGNOSIS — I110 Hypertensive heart disease with heart failure: Secondary | ICD-10-CM | POA: Diagnosis not present

## 2017-07-19 ENCOUNTER — Telehealth: Payer: Self-pay | Admitting: Family Medicine

## 2017-07-19 DIAGNOSIS — G250 Essential tremor: Secondary | ICD-10-CM | POA: Diagnosis not present

## 2017-07-19 DIAGNOSIS — M109 Gout, unspecified: Secondary | ICD-10-CM | POA: Diagnosis not present

## 2017-07-19 DIAGNOSIS — I5030 Unspecified diastolic (congestive) heart failure: Secondary | ICD-10-CM | POA: Diagnosis not present

## 2017-07-19 DIAGNOSIS — M170 Bilateral primary osteoarthritis of knee: Secondary | ICD-10-CM | POA: Diagnosis not present

## 2017-07-19 DIAGNOSIS — E1142 Type 2 diabetes mellitus with diabetic polyneuropathy: Secondary | ICD-10-CM | POA: Diagnosis not present

## 2017-07-19 DIAGNOSIS — I110 Hypertensive heart disease with heart failure: Secondary | ICD-10-CM | POA: Diagnosis not present

## 2017-07-19 NOTE — Telephone Encounter (Signed)
Order faxed to [facility] for u/a and urine culture

## 2017-07-19 NOTE — Telephone Encounter (Signed)
Urine and culture

## 2017-07-19 NOTE — Telephone Encounter (Signed)
Pt has a possible uti and [name] is requesting an order for a urinalysis.

## 2017-07-20 DIAGNOSIS — E1151 Type 2 diabetes mellitus with diabetic peripheral angiopathy without gangrene: Secondary | ICD-10-CM | POA: Diagnosis not present

## 2017-07-20 DIAGNOSIS — L84 Corns and callosities: Secondary | ICD-10-CM | POA: Diagnosis not present

## 2017-07-20 DIAGNOSIS — R399 Unspecified symptoms and signs involving the genitourinary system: Secondary | ICD-10-CM | POA: Diagnosis not present

## 2017-07-21 ENCOUNTER — Telehealth: Payer: Self-pay | Admitting: *Deleted

## 2017-07-21 DIAGNOSIS — G250 Essential tremor: Secondary | ICD-10-CM | POA: Diagnosis not present

## 2017-07-21 DIAGNOSIS — M170 Bilateral primary osteoarthritis of knee: Secondary | ICD-10-CM | POA: Diagnosis not present

## 2017-07-21 DIAGNOSIS — I110 Hypertensive heart disease with heart failure: Secondary | ICD-10-CM | POA: Diagnosis not present

## 2017-07-21 DIAGNOSIS — E1142 Type 2 diabetes mellitus with diabetic polyneuropathy: Secondary | ICD-10-CM | POA: Diagnosis not present

## 2017-07-21 DIAGNOSIS — M109 Gout, unspecified: Secondary | ICD-10-CM | POA: Diagnosis not present

## 2017-07-21 DIAGNOSIS — I5030 Unspecified diastolic (congestive) heart failure: Secondary | ICD-10-CM | POA: Diagnosis not present

## 2017-07-21 NOTE — Telephone Encounter (Signed)
Pt needs appt with Dr. Frederich Cha neurosurgeon in [place]. Tried to get appt for pt next week per dr Richardson Landry. Office staff states they talked with Dr. Arnoldo Morale and he did not need to be seen for 2 -3 weeks. appt scheduled for sept 25th 10 am. [name] pt's niece notified of appt and address and phone number given to office.

## 2017-07-25 ENCOUNTER — Telehealth: Payer: Self-pay | Admitting: Family Medicine

## 2017-07-25 NOTE — Telephone Encounter (Signed)
Afrin prn up to one spray bid next five d for nasal cong

## 2017-07-25 NOTE — Telephone Encounter (Signed)
Pt has a stopped up nose and is needing something to open up his sinuses. Please advise.   [facility]

## 2017-07-25 NOTE — Telephone Encounter (Signed)
I spoke with Loma Sousa med tech at [facility] she was made aware verbally and I sent an order over for the Afrin to fax # [phone].

## 2017-07-25 NOTE — Telephone Encounter (Signed)
I spoke with the pt's niece/poa ([name]). She states the pt started complaining of having a stopped up nose yesterday. There are no other complaints, would like something called into the drugstore for this please.

## 2017-07-27 ENCOUNTER — Telehealth: Payer: Self-pay | Admitting: Family Medicine

## 2017-07-27 MED ORDER — ALPRAZOLAM 0.25 MG PO TABS: ORAL_TABLET | ORAL | 5 refills | Status: DC

## 2017-07-27 NOTE — Telephone Encounter (Signed)
Requesting a hard Rx for alprazolam 0.25 mg tablet.

## 2017-07-27 NOTE — Telephone Encounter (Signed)
Prescription faxed to [facility]

## 2017-07-27 NOTE — Telephone Encounter (Signed)
Ok, assuming hard means written

## 2017-07-29 ENCOUNTER — Telehealth: Payer: Self-pay | Admitting: *Deleted

## 2017-07-29 ENCOUNTER — Other Ambulatory Visit: Payer: Self-pay | Admitting: *Deleted

## 2017-07-29 MED ORDER — TRAMADOL HCL 50 MG PO TABS: ORAL_TABLET | ORAL | 0 refills | Status: DC

## 2017-07-29 MED ORDER — ALPRAZOLAM 0.25 MG PO TABS: ORAL_TABLET | ORAL | 0 refills | Status: DC

## 2017-07-29 NOTE — Telephone Encounter (Signed)
sure

## 2017-07-29 NOTE — Telephone Encounter (Signed)
DONE

## 2017-07-29 NOTE — Telephone Encounter (Signed)
Angela from [facility] calling to get orders changed. She states [name] pt's niece wants the xanax 0.25mg  order changed from one qhs to one bid and wants it scheduled not as needed, tramadol 50mg  one qid prn changed to one bid scheduled and also have order to do one qid as needed. Also requesting order for u/a due to frequent urination.  If approved fax order to [facility] [phone] atten Angela.

## 2017-08-04 ENCOUNTER — Other Ambulatory Visit: Payer: Self-pay

## 2017-08-04 ENCOUNTER — Other Ambulatory Visit: Payer: Self-pay | Admitting: *Deleted

## 2017-08-04 MED ORDER — ALPRAZOLAM 0.25 MG PO TABS: ORAL_TABLET | ORAL | 0 refills | Status: DC

## 2017-08-04 MED ORDER — ALPRAZOLAM 0.25 MG PO TABS: ORAL_TABLET | ORAL | 5 refills | Status: DC

## 2017-08-09 DIAGNOSIS — I10 Essential (primary) hypertension: Secondary | ICD-10-CM | POA: Diagnosis not present

## 2017-08-09 DIAGNOSIS — S12110D Anterior displaced Type II dens fracture, subsequent encounter for fracture with routine healing: Secondary | ICD-10-CM | POA: Diagnosis not present

## 2017-08-09 DIAGNOSIS — M542 Cervicalgia: Secondary | ICD-10-CM | POA: Diagnosis not present

## 2017-08-18 ENCOUNTER — Telehealth: Payer: Self-pay | Admitting: Family Medicine

## 2017-08-18 NOTE — Telephone Encounter (Signed)
[facility] called to check status of this  Please see lab results in red folder in yellow box (since Dr. Richardson Landry is not back until 08/24/17, sent to Dr. Nicki Reaper)  Once signed off on please fax back to [name] at [phone]

## 2017-08-19 MED ORDER — CEFPROZIL 250 MG PO TABS: 250.0000 mg | ORAL_TABLET | Freq: Two times a day (BID) | ORAL | 0 refills | Status: DC

## 2017-08-19 NOTE — Telephone Encounter (Signed)
I would recommend Cefzil 250 mg 1 twice daily for the next 7 days if ongoing troubles or problems reconnect with or follow-up here or at ER

## 2017-08-19 NOTE — Telephone Encounter (Signed)
Per Levada Dy at [facility] pt has not been treated for the UTI.He is asymptomatic, no fever, having frequency needs to go Q hour to use the bathroom.He is not in any pain,but increased confusion. Please advise.

## 2017-08-19 NOTE — Telephone Encounter (Signed)
I spoke w Levada Dy at [facility] gave her a verbal order and faxed the rx to her atten at [phone].

## 2017-08-19 NOTE — Telephone Encounter (Signed)
Please discuss this with the nurses at [facility] find out has this patient been treated with any antibiotics for this? Is a patient having any symptoms such as fever vomiting? I would recommend if fevers or vomiting occurs for ER evaluation. Please find out how things are going discuss with me then discuss with me then we can order appropriate antibiotics

## 2017-09-17 ENCOUNTER — Emergency Department (HOSPITAL_COMMUNITY): Payer: Medicare Other

## 2017-09-17 ENCOUNTER — Encounter (HOSPITAL_COMMUNITY): Payer: Self-pay | Admitting: Emergency Medicine

## 2017-09-17 ENCOUNTER — Emergency Department (HOSPITAL_COMMUNITY)
Admission: EM | Admit: 2017-09-17 | Discharge: 2017-09-17 | Disposition: A | Discharge status: Skilled Nursing Facility | Payer: Medicare Other | Attending: Emergency Medicine | Admitting: Emergency Medicine

## 2017-09-17 DIAGNOSIS — Z471 Aftercare following joint replacement surgery: Secondary | ICD-10-CM | POA: Diagnosis not present

## 2017-09-17 DIAGNOSIS — T148XXA Other injury of unspecified body region, initial encounter: Secondary | ICD-10-CM | POA: Diagnosis not present

## 2017-09-17 DIAGNOSIS — S51811A Laceration without foreign body of right forearm, initial encounter: Secondary | ICD-10-CM | POA: Diagnosis not present

## 2017-09-17 DIAGNOSIS — E119 Type 2 diabetes mellitus without complications: Secondary | ICD-10-CM | POA: Diagnosis not present

## 2017-09-17 DIAGNOSIS — Z7984 Long term (current) use of oral hypoglycemic drugs: Secondary | ICD-10-CM | POA: Diagnosis not present

## 2017-09-17 DIAGNOSIS — S59911A Unspecified injury of right forearm, initial encounter: Secondary | ICD-10-CM | POA: Diagnosis present

## 2017-09-17 DIAGNOSIS — J449 Chronic obstructive pulmonary disease, unspecified: Secondary | ICD-10-CM | POA: Insufficient documentation

## 2017-09-17 DIAGNOSIS — S4991XA Unspecified injury of right shoulder and upper arm, initial encounter: Secondary | ICD-10-CM | POA: Diagnosis not present

## 2017-09-17 DIAGNOSIS — Y939 Activity, unspecified: Secondary | ICD-10-CM | POA: Insufficient documentation

## 2017-09-17 DIAGNOSIS — Y92129 Unspecified place in nursing home as the place of occurrence of the external cause: Secondary | ICD-10-CM | POA: Diagnosis not present

## 2017-09-17 DIAGNOSIS — I110 Hypertensive heart disease with heart failure: Secondary | ICD-10-CM | POA: Insufficient documentation

## 2017-09-17 DIAGNOSIS — Y999 Unspecified external cause status: Secondary | ICD-10-CM | POA: Diagnosis not present

## 2017-09-17 DIAGNOSIS — M25561 Pain in right knee: Secondary | ICD-10-CM

## 2017-09-17 DIAGNOSIS — J45909 Unspecified asthma, uncomplicated: Secondary | ICD-10-CM | POA: Insufficient documentation

## 2017-09-17 DIAGNOSIS — Z96641 Presence of right artificial hip joint: Secondary | ICD-10-CM | POA: Diagnosis not present

## 2017-09-17 DIAGNOSIS — M25511 Pain in right shoulder: Secondary | ICD-10-CM | POA: Insufficient documentation

## 2017-09-17 DIAGNOSIS — M25562 Pain in left knee: Secondary | ICD-10-CM | POA: Insufficient documentation

## 2017-09-17 DIAGNOSIS — W19XXXA Unspecified fall, initial encounter: Secondary | ICD-10-CM | POA: Insufficient documentation

## 2017-09-17 DIAGNOSIS — S8992XA Unspecified injury of left lower leg, initial encounter: Secondary | ICD-10-CM | POA: Diagnosis not present

## 2017-09-17 DIAGNOSIS — M25462 Effusion, left knee: Secondary | ICD-10-CM | POA: Diagnosis not present

## 2017-09-17 DIAGNOSIS — I509 Heart failure, unspecified: Secondary | ICD-10-CM | POA: Diagnosis not present

## 2017-09-17 DIAGNOSIS — S51819A Laceration without foreign body of unspecified forearm, initial encounter: Secondary | ICD-10-CM

## 2017-09-17 DIAGNOSIS — S8991XA Unspecified injury of right lower leg, initial encounter: Secondary | ICD-10-CM | POA: Diagnosis not present

## 2017-09-17 MED ORDER — TRAMADOL HCL 50 MG PO TABS: 50.0000 mg | ORAL_TABLET | Freq: Four times a day (QID) | ORAL | 0 refills | Status: DC | PRN

## 2017-09-17 NOTE — ED Notes (Signed)
Pt resting with eyes closed, appears to be in no distress. Respirations are even and unlabored. Family at bedside.

## 2017-09-17 NOTE — ED Notes (Signed)
D/c instructions reviewed with family member prior to d/c.

## 2017-09-17 NOTE — Discharge Instructions (Signed)
X-rays of right shoulder area and both knees hips and pelvis without any acute findings.  There was a questionable fracture to the left kneecap.  But CT scan is negative.  Does have arthritis in both knees.  Recommend treatment symptomatically is.  Wound care for the skin tears on the right forearm.  Return for any new or worse symptoms or as needed.

## 2017-09-17 NOTE — ED Notes (Signed)
Report given to [facility] LPN Enis Gash.

## 2017-09-17 NOTE — ED Notes (Signed)
EMS called for transport back home 

## 2017-09-17 NOTE — ED Triage Notes (Signed)
Pt fell yesterday and is c/o right knee pain with weight bearing.  States it does not hurt unless he walks.

## 2017-09-17 NOTE — ED Provider Notes (Signed)
[facility] EMERGENCY DEPARTMENT Provider Note   CSN: [id number] Arrival date & time: 09/17/17  6948     History   Chief Complaint Chief Complaint  Patient presents with  . Knee Pain    HPI Christian Thomas is a 83 y.o. male.  Patient with fall yesterday at nursing facility.  Patient today with complaint of increased right knee pain when trying to weight-bear.  Patient also sustained some skin tears to his right forearm.  Patient complaining of right proximal arm and shoulder pain.  Also complaining of both hips hurting.      Past Medical History:  Diagnosis Date  . Benign essential tremor   . BPH (benign prostatic hypertrophy)   . Cerebellar stroke syndrome   . CHF (congestive heart failure) ([place])   . Chronic constipation   . Clostridium difficile colitis 12/2008  . DDD (degenerative disc disease), lumbar   . Diabetic neuropathy ([place])   . Diverticulosis   . Dysrhythmia   . Essential hypertension, benign   . Gout   . Macular degeneration   . Mixed hyperlipidemia   . Stroke ([place])   . Type 2 diabetes mellitus ([place])   . Venous insufficiency     Patient Active Problem List   Diagnosis Date Noted  . Hypoxia 08/10/2016  . COPD exacerbation ([place]) with hypoxia and wheezy bronchitis 08/10/2016  . Asthma with acute exacerbation 03/03/2016  . Degenerative arthritis of knee, bilateral 12/10/2014  . GI bleeding 12/14/2013  . Accelerated hypertension 12/14/2013  . Anemia due to blood loss, acute 12/14/2013  . Dizziness and giddiness 08/20/2013  . Cerebellar stroke syndrome 08/20/2013  . Hereditary and idiopathic peripheral neuropathy 06/15/2013  . Prostate hypertrophy 06/15/2013  . DM2 (diabetes mellitus, type 2) ([place]) 11/05/2012  . Femoral neck fracture ([place]) 11/05/2012  . Constipation 11/05/2012  . Syncope 07/07/2011  . Abnormal ECG 07/07/2011  . Essential hypertension, benign 07/07/2011    Past Surgical History:  Procedure Laterality Date  . APPENDECTOMY    .  COLONOSCOPY     NL TI, pan-TICS, sml IH, CDIFF COLITIS  . COLONOSCOPY N/A 12/15/2013   Procedure: Colonoscopy and possible EGD;  Surgeon: Rogene Houston, MD;  Location: [facility] ENDO SUITE;  Service: Endoscopy;  Laterality: N/A;  . HEMORRHOID SURGERY    . HERNIA REPAIR    . HIP ARTHROPLASTY  11/07/2012   Procedure: ARTHROPLASTY BIPOLAR HIP;  Surgeon: Mauri Pole, MD;  Location: [facility] ORS;  Service: Orthopedics;  Laterality: Right;  . SIGMOIDOSCOPY     Internal hemorrhoids       Home Medications    Prior to Admission medications   Medication Sig Start Date End Date Taking? Authorizing Provider  acetaminophen (TYLENOL) 325 MG tablet Take 650 mg by mouth every 4 (four) hours as needed.   Yes [provider]  albuterol (PROVENTIL HFA;VENTOLIN HFA) 108 (90 Base) MCG/ACT inhaler Inhale 2 puffs into the lungs every 6 (six) hours as needed for wheezing or shortness of breath. 06/07/16  Yes Mikey Kirschner, MD  albuterol (PROVENTIL) (2.5 MG/3ML) 0.083% nebulizer solution Take 3 mLs (2.5 mg total) by nebulization every 4 (four) hours as needed for wheezing or shortness of breath. 04/04/17  Yes Mikey Kirschner, MD  ALPRAZolam Duanne Moron) 0.25 MG tablet Take 1 tablet BID Patient taking differently: Take 0.25 mg by mouth 2 (two) times daily. Take 1 tablet BID 08/04/17  Yes Mikey Kirschner, MD  calcium carbonate (TUMS - DOSED IN MG ELEMENTAL CALCIUM) 500 MG  chewable tablet Chew 2 tablets by mouth every 6 (six) hours as needed for indigestion or heartburn.   Yes [provider]  capsaicin (ZOSTRIX) 0.025 % cream Apply 1 application topically 2 (two) times daily as needed (apply to left ankle and feet as needed).   Yes [provider]  Diphenhyd-Hydrocort-Nystatin (FIRST-DUKES MOUTHWASH) SUSP One tablespoon swish and gargle qid  prn Patient taking differently: Take 15 mLs by mouth 4 (four) times daily as needed. One tablespoon swish and gargle as needed 12/03/15  Yes Mikey Kirschner,  MD  diphenhydrAMINE (BENADRYL) 25 MG tablet Take 25 mg by mouth every 6 (six) hours as needed for itching.   Yes [provider]  enalapril (VASOTEC) 10 MG tablet TAKE (1) TABLET BY MOUTH TWICE DAILY. 06/07/16  Yes Mikey Kirschner, MD  glipiZIDE (GLUCOTROL XL) 2.5 MG 24 hr tablet Take 1 tablet (2.5 mg total) by mouth daily with breakfast. 06/07/16  Yes Mikey Kirschner, MD  hydrOXYzine (ATARAX/VISTARIL) 25 MG tablet TAKE ONE TABLET BY MOUTH AT BEDTIME AS NEEDED ITCHING/SLEEP. Patient taking differently: TAKE ONE TABLET BY MOUTH AT BEDTIME 01/02/15  Yes Mikey Kirschner, MD  INSTA-GLUCOSE 77.4 % GEL GIVE 1 TUBE BY MOUTH IF B/S <60; WAIT 15 MINUTES THEN RECHECK. 02/10/15  Yes Mikey Kirschner, MD  loratadine (CLARITIN) 10 MG tablet TAKE 1 TABLET BY MOUTH ONCE DAILY FOR ALLERGIC RHINITIS. 09/02/14  Yes Luking, Elayne Snare, MD  lovastatin (MEVACOR) 40 MG tablet TAKE (1) TABLET BY MOUTH AT BEDTIME FOR CHOLESTEROL. 06/07/16  Yes Mikey Kirschner, MD  magnesium hydroxide (MILK OF MAGNESIA) 400 MG/5ML suspension Take 30 mLs by mouth daily as needed for mild constipation.   Yes [provider]  meclizine (ANTIVERT) 25 MG tablet Take 25 mg by mouth every 6 (six) hours as needed for dizziness.   Yes [provider]  Menthol, Topical Analgesic, (BIOFREEZE EX) Apply 1 application topically 4 (four) times daily as needed (for pain).   Yes [provider]  metFORMIN (GLUCOPHAGE) 500 MG tablet Take 1 tablet (500 mg total) by mouth 2 (two) times daily. 06/07/16  Yes Mikey Kirschner, MD  nortriptyline (PAMELOR) 10 MG capsule Take 1 capsule (10 mg total) by mouth at bedtime. For neuropathic pain 12/22/16  Yes Mikey Kirschner, MD  Olopatadine HCl 0.2 % SOLN One drop each eye daily 03/16/17  Yes Mikey Kirschner, MD  omeprazole (PRILOSEC) 20 MG capsule Take 1 capsule (20 mg total) by mouth 2 (two) times daily before a meal. 06/07/16  Yes Mikey Kirschner, MD  oxymetazoline (AFRIN) 0.05 %  nasal spray Place 1 spray into both nostrils 2 (two) times daily as needed for congestion.   Yes [provider]  polyethylene glycol powder (GLYCOLAX/MIRALAX) powder Take 17 g by mouth 2 (two) times daily.    Yes [provider]  QC ANTACID/ANTI-GAS [phone] MG/5ML suspension TAKE 2 TABLESPOONFULS (76ml) BY MOUTH EACH EVENING. 02/10/15  Yes Mikey Kirschner, MD  senna (SENOKOT) 8.6 MG TABS tablet TAKE 2 TABLETS BY MOUTH AT BEDTIME. 12/26/14  Yes Mikey Kirschner, MD  SORE THROAT LIQD USE 5 SPRAYS IN THROAT EVERY 2 HOURS AS NEEDED FOR SORE THROAT. 01/08/15  Yes Mikey Kirschner, MD  traMADol (ULTRAM) 50 MG tablet TAKE 1 BID SCHEDULED AND MAX ONE EVERY 6 HOURS PRN PAIN 07/29/17  Yes Mikey Kirschner, MD  traZODone (DESYREL) 50 MG tablet One qhs Patient taking differently: Take 50 mg by mouth at  bedtime. One qhs 04/26/17  Yes Mikey Kirschner, MD  Zinc Oxide (DESITIN) 13 % CREA Apply 1 application topically daily as needed (rash).   Yes [provider]  cefPROZIL (CEFZIL) 250 MG tablet Take 1 tablet (250 mg total) by mouth 2 (two) times daily. Patient not taking: Reported on 09/17/2017 08/19/17   Kathyrn Drown, MD  Spacer/Aero-Holding Chambers (AEROCHAMBER MINI CHAMBER) DEVI Use as directed 09/01/15   Mikey Kirschner, MD  traMADol (ULTRAM) 50 MG tablet Take 1 tablet (50 mg total) by mouth every 6 (six) hours as needed. 09/17/17   Fredia Sorrow, MD    Family History Family History  Problem Relation Age of Onset  . Cancer Mother        Breast    Social History Social History  Substance Use Topics  . Smoking status: Former Smoker    Packs/day: 0.50    Years: 54.00    Types: Cigarettes    Quit date: 11/19/1992  . Smokeless tobacco: Never Used  . Alcohol use Yes     Comment: Rare     Allergies   Morphine and related and Other   Review of Systems Review of Systems  Constitutional: Negative for fever.  HENT: Positive for hearing loss. Negative for  congestion.   Eyes: Negative for redness.  Respiratory: Negative for shortness of breath.   Cardiovascular: Negative for chest pain.  Gastrointestinal: Negative for abdominal pain.  Genitourinary: Negative for dysuria.  Musculoskeletal: Positive for arthralgias and joint swelling. Negative for back pain and neck pain.  Skin: Negative for rash.  Neurological: Negative for headaches.  Hematological: Does not bruise/bleed easily.  Psychiatric/Behavioral: Negative for confusion.     Physical Exam Updated Vital Signs BP (!) 152/78   Pulse 78   Temp 98.1 F (36.7 C) (Oral)   Resp 18   Ht 1.905 m (6\' 3" )   Wt 82.6 kg (182 lb)   SpO2 95%   BMI 22.75 kg/m   Physical Exam  Constitutional: He appears well-developed and well-nourished. No distress.  HENT:  Head: Normocephalic and atraumatic.  Mouth/Throat: Oropharynx is clear and moist.  Eyes: Pupils are equal, round, and reactive to light. Conjunctivae and EOM are normal.  Neck: Normal range of motion. Neck supple.  Cardiovascular: Normal rate, regular rhythm and normal heart sounds.   Pulmonary/Chest: Effort normal and breath sounds normal. No respiratory distress.  Abdominal: Soft. Bowel sounds are normal. There is no tenderness.  Musculoskeletal: Normal range of motion. He exhibits edema.  Slight swelling to the right knee.  Slight tenderness to palpation to both knees.  No erythema no abrasions.  Right forearm with a linear skin tear measuring 4 cm.  With dressing in place.  No signs of secondary infection.  Good range of motion at hips.  Some pain with range of motion of right shoulder.  But no obvious deformity.  Radial pulse distally 2+.  Good cap refill to both toes.  Neurological: A cranial nerve deficit is present. No sensory deficit. He exhibits normal muscle tone. Coordination normal.  Hearing deficit  Skin: Skin is warm.  Nursing note and vitals reviewed.    ED Treatments / Results  Labs (all labs ordered are listed,  but only abnormal results are displayed) Labs Reviewed - No data to display  EKG  EKG Interpretation None       Radiology Ct Knee Left Wo Contrast  Result Date: 09/17/2017 CLINICAL DATA:  Fall yesterday, possible radiographically occult fracture. EXAM: CT OF  THE left KNEE WITHOUT CONTRAST TECHNIQUE: Multidetector CT imaging of the left knee was performed according to the standard protocol. Multiplanar CT image reconstructions were also generated. COMPARISON:  Radiographs from 09/17/2017 FINDINGS: Bones/Joint/Cartilage Bony demineralization. Notable tricompartmental osteoarthritis with associated marginal spurring and articular space loss is specially in the medial compartment. Subcortical sclerosis in the medial tibial plateau and posteriorly in the lateral tibial plateau is probably due to his osteoarthritis rather than impaction injury. No definite cortical discontinuity characteristic of acute fracture. Degenerative subcortical cystic lesions are observed along the lateral femoral trochlear groove. Mild articular surface spurring along the medial femoral condyles. There is an effusion in the suprapatellar bursa along with indistinct tissue planes around the cruciate ligaments suggesting surrounding fluid. The menisci are not well assessed on today' s exam. Ligaments Suboptimally assessed by CT. No obvious rupture of the MCL or fibular collateral ligament. Muscles and Tendons No obvious rupture of the quadriceps or patellar tendon. There is some relative atrophy of the gastrocnemius musculature. Soft tissues Small Baker's cyst. Prepatellar subcutaneous edema. Atherosclerotic vascular calcifications. IMPRESSION: 1. No fracture is identified. 2. Small knee effusion and small Baker's cyst. 3. Moderate to severe osteoarthritis that is severe osteoarthritis. 4. Prepatellar subcutaneous edema. 5. Atherosclerosis. Electronically Signed   By: Van Clines M.D.   On: 09/17/2017 14:23   Dg Knee Complete  4 Views Left  Result Date: 09/17/2017 CLINICAL DATA:  One 83 year old male with a history of fall. Pain and at the knee. EXAM: LEFT KNEE - COMPLETE 4+ VIEW COMPARISON:  None. FINDINGS: No acute displaced fracture identified. Osteopenia. Lateral greater than medial joint space narrowing with marginal osteophyte formation. Likely bone-on-bone articulation at the lateral compartment. Degenerative changes of the patellofemoral joint. Questionable lucency at the superior aspect of the patella, however, osteopenia somewhat limits evaluation. There is associated soft tissue swelling/joint effusion on the lateral view. Atherosclerosis IMPRESSION: No acute displaced fracture of femur, tibia, fibula. Questionable nondisplaced fracture involving the superior aspect of the patella with associated joint effusion/soft tissue swelling. Correlation with point tenderness at the superior patella may be useful. Advanced tricompartmental osteoarthritis. Osteopenia. Atherosclerosis Electronically Signed   By: Corrie Mckusick D.O.   On: 09/17/2017 11:07   Dg Knee Complete 4 Views Right  Result Date: 09/17/2017 CLINICAL DATA:  One 83 year old male status post fall yesterday. EXAM: RIGHT KNEE - COMPLETE 4+ VIEW COMPARISON:  None. FINDINGS: No evidence of fracture, dislocation, or joint effusion. No evidence of significant arthropathy or other focal bone abnormality. Soft tissues are unremarkable. IMPRESSION: Negative. Electronically Signed   By: Kristopher Oppenheim M.D.   On: 09/17/2017 11:07   Dg Humerus Right  Result Date: 09/17/2017 CLINICAL DATA:  One 83 year old male status post fall yesterday. EXAM: RIGHT HUMERUS - 2+ VIEW COMPARISON:  None. FINDINGS: There is no evidence of fracture or other focal bone lesions. Soft tissues are unremarkable. IMPRESSION: Negative. Electronically Signed   By: Kristopher Oppenheim M.D.   On: 09/17/2017 11:06   Dg Hips Bilat W Or Wo Pelvis 3-4 Views  Result Date: 09/17/2017 CLINICAL  DATA:  One 83 year old male status post fall yesterday. EXAM: DG HIP (WITH OR WITHOUT PELVIS) 3-4V BILAT COMPARISON:  None. FINDINGS: The patient is status post right total hip arthroplasty. Hardware is intact. There is no evidence of hip fracture or dislocation. Scratched Note is made of mild degenerative changes. No other focal bone abnormality. Soft tissue structures grossly unremarkable. IMPRESSION: No acute hip fracture or  dislocation. Status post right total hip arthroplasty. Electronically Signed   By: Kristopher Oppenheim M.D.   On: 09/17/2017 11:08    Procedures Procedures (including critical care time)  Medications Ordered in ED Medications - No data to display   Initial Impression / Assessment and Plan / ED Course  I have reviewed the triage vital signs and the nursing notes.  Pertinent labs & imaging results that were available during my care of the patient were reviewed by me and considered in my medical decision making (see chart for details).     Patient with fall at nursing facility yesterday.  Increased right knee pain today.  Patient also seem to have some hip pain and had left knee pain had right shoulder area pain.  States he did not hit his head no head injury.  Does have skin tear wound to the right forearm.  X-rays of both knees without any obvious findings other than some arthritic changes.  There was some slight swelling and effusion.  Left knee with questionable fracture to the patella.  CT scan done without evidence of any fracture.  Patient will be treated symptomatically with tramadol is taken that in the past even though he has an allergy to morphine x-ray of the right humerus without any bony abnormalities.  Patient will require continued wound care for the skin tears.  Patient stable for discharge back to nursing facility.  Final Clinical Impressions(s) / ED Diagnoses   Final diagnoses:  Fall, initial encounter  Acute pain of both knees  Acute pain of right  shoulder  Skin tear of forearm without complication, initial encounter    New Prescriptions New Prescriptions   TRAMADOL (ULTRAM) 50 MG TABLET    Take 1 tablet (50 mg total) by mouth every 6 (six) hours as needed.     Fredia Sorrow, MD 09/17/17 1500

## 2017-09-21 ENCOUNTER — Encounter: Payer: Self-pay | Admitting: Family Medicine

## 2017-09-21 ENCOUNTER — Ambulatory Visit (INDEPENDENT_AMBULATORY_CARE_PROVIDER_SITE_OTHER): Payer: Medicare Other | Admitting: Family Medicine

## 2017-09-21 VITALS — BP 110/70 | Ht 72.0 in

## 2017-09-21 DIAGNOSIS — M170 Bilateral primary osteoarthritis of knee: Secondary | ICD-10-CM

## 2017-09-21 DIAGNOSIS — E119 Type 2 diabetes mellitus without complications: Secondary | ICD-10-CM

## 2017-09-21 DIAGNOSIS — I10 Essential (primary) hypertension: Secondary | ICD-10-CM

## 2017-09-21 DIAGNOSIS — J441 Chronic obstructive pulmonary disease with (acute) exacerbation: Secondary | ICD-10-CM

## 2017-09-21 LAB — POCT GLYCOSYLATED HEMOGLOBIN (HGB A1C): Hemoglobin A1C: 5.9

## 2017-09-21 NOTE — Progress Notes (Signed)
   Subjective:    Patient ID: Christian Thomas, male    DOB: 06-27-1915, 83 y.o.   MRN: [id number]  Diabetes  He presents for his follow-up diabetic visit. He has type 2 diabetes mellitus. No MedicAlert identification noted. He has not had a previous visit with a dietitian. He does not see a podiatrist.Eye exam is not current.   Patient's family has concerns of leg weakness and decreased appetite. Also has concern of decreased activity and sleep more. Patient took yet another fall.  All hospital report imaging tests and notes reviewed  Past few days.  No fever no chills.  No wheezing  Continues to use nebulizer as needed continues to wear cervical spine hard collar.  Neurosurgeon was to remain for 6 weeks  Patient has concerns of cough.   Results for orders placed or performed in visit on 09/21/17  POCT HgB A1C  Result Value Ref Range   Hemoglobin A1C 5.9   \ Review of Systems No headache, no major weight loss or weight gain, no chest pain no back pain abdominal pain no change in bowel habits complete ROS otherwise negative     Objective:   Physical Exam  Alert and oriented, vitals reviewed and stable, NAD ENT-TM's and ext canals WNL bilat via otoscopic exam Soft palate, tonsils and post pharynx WNL via oropharyngeal exam Neck-symmetric, no masses; thyroid nonpalpable and nontender Pulmonary-no tachypnea or accessory muscle use; Clear without wheezes via auscultation Card--no abnrml murmurs, rhythm reg and rate WNL Carotid pulses symmetric, without bruits  Patient's knees were prepped, anesthetized, injected 1 cc Depo-Medrol and 2 cc Xylocaine    1 status post fall discussed  2.  Airways.  Discussed.  Continue albuterol as needed  3.  Type 2 diabetes.  Clinically stable   4.  Status post cervical spine fracture.  Using collar discussed and reviewed  5.  Bilateral knee arthritis injection given the steroids  Greater than 50% of this 25 minute face to face visit was  spent in counseling and discussion and coordination of care regarding the above diagnosis/diagnosies       Assessment & Plan:

## 2017-09-25 MED ORDER — METHYLPREDNISOLONE ACETATE 40 MG/ML IJ SUSP: 40.0000 mg | Freq: Once | INTRAMUSCULAR | Status: DC

## 2017-10-04 DIAGNOSIS — E1151 Type 2 diabetes mellitus with diabetic peripheral angiopathy without gangrene: Secondary | ICD-10-CM | POA: Diagnosis not present

## 2017-10-04 DIAGNOSIS — L84 Corns and callosities: Secondary | ICD-10-CM | POA: Diagnosis not present

## 2017-10-07 ENCOUNTER — Other Ambulatory Visit: Payer: Self-pay | Admitting: Family Medicine

## 2017-10-19 ENCOUNTER — Telehealth: Payer: Self-pay | Admitting: *Deleted

## 2017-10-19 ENCOUNTER — Other Ambulatory Visit: Payer: Self-pay | Admitting: *Deleted

## 2017-10-19 MED ORDER — TRAMADOL HCL 50 MG PO TABS: 50.0000 mg | ORAL_TABLET | Freq: Two times a day (BID) | ORAL | 4 refills | Status: DC

## 2017-10-19 NOTE — Telephone Encounter (Signed)
he may have a prescription of this 1 twice daily, #60, 4refills

## 2017-10-19 NOTE — Telephone Encounter (Signed)
Fax from [facility] requesting a new hard script for tramadol 50mg . Take one tab by mouth bid. Please call facility when script is ready for pickup. [phone]

## 2017-10-19 NOTE — Telephone Encounter (Signed)
Sharyn Lull at [facility] aware and will have the rx picked up from the front desk here.

## 2017-10-20 ENCOUNTER — Ambulatory Visit (INDEPENDENT_AMBULATORY_CARE_PROVIDER_SITE_OTHER): Payer: Medicare Other | Admitting: Otolaryngology

## 2017-10-20 DIAGNOSIS — H6123 Impacted cerumen, bilateral: Secondary | ICD-10-CM | POA: Diagnosis not present

## 2017-10-21 ENCOUNTER — Other Ambulatory Visit: Payer: Self-pay | Admitting: *Deleted

## 2017-10-21 MED ORDER — HYDROXYZINE HCL 25 MG PO TABS: ORAL_TABLET | ORAL | 1 refills | Status: DC

## 2017-10-28 ENCOUNTER — Other Ambulatory Visit: Payer: Self-pay | Admitting: Family Medicine

## 2017-11-01 DIAGNOSIS — S12112D Nondisplaced Type II dens fracture, subsequent encounter for fracture with routine healing: Secondary | ICD-10-CM | POA: Diagnosis not present

## 2017-11-01 DIAGNOSIS — S12110D Anterior displaced Type II dens fracture, subsequent encounter for fracture with routine healing: Secondary | ICD-10-CM | POA: Diagnosis not present

## 2017-11-09 ENCOUNTER — Telehealth: Payer: Self-pay

## 2017-11-09 NOTE — Telephone Encounter (Signed)
[facility] had sent over a note stating that the pt was agitated and he had a strong smell to urine,and they would like to get an order from [place] to do a UA. I called back to the nursing home spoke with Levada Dy the med tech whom states she has been with the pt all week end and she has not noticed anything wrong with the urine no strong smell. She states he is agitated as he feels he is unable to do for him self and he is feeling sad and anxious,crying . He is on xanax .25 BID. Please advise if you would like for me to order that UA. Thanks CS

## 2017-11-09 NOTE — Telephone Encounter (Signed)
wpould off on urine test, would prefer not to incr xanax , ifpt may benfit from lexapro 10 daily one ea morn to helo with mood, rec starting if pt willing six mo worth

## 2017-11-10 ENCOUNTER — Telehealth: Payer: Self-pay | Admitting: Family Medicine

## 2017-11-10 DIAGNOSIS — R2681 Unsteadiness on feet: Secondary | ICD-10-CM

## 2017-11-10 DIAGNOSIS — M542 Cervicalgia: Secondary | ICD-10-CM

## 2017-11-10 NOTE — Telephone Encounter (Signed)
Niece Caryl Asp Ward said that Christian Thomas has been complaining of his neck and arm hurting and being stiff.  She wonders if it is because he was in the neck brace for so long that it has made his muscles all stiff and would Physical therapy at the nursing home help?

## 2017-11-10 NOTE — Telephone Encounter (Signed)
Sure lets try p t referral

## 2017-11-10 NOTE — Telephone Encounter (Signed)
Spoke with nurse at [facility] and advised Dr Richardson Landry would off on urine test, would prefer not to increase xanax , If patietn may benfit from lexapro 10 daily one each morning to help with mood, rec starting if pt willing six mo worth. Nurse stated she will speak with patient and call us back in the morning to let us know if he is willing to start lexapro.

## 2017-11-10 NOTE — Telephone Encounter (Signed)
Referral ordered in Epic. 

## 2017-11-14 ENCOUNTER — Other Ambulatory Visit: Payer: Self-pay | Admitting: *Deleted

## 2017-11-14 MED ORDER — TRAMADOL HCL 50 MG PO TABS: 50.0000 mg | ORAL_TABLET | Freq: Four times a day (QID) | ORAL | 2 refills | Status: DC | PRN

## 2017-11-14 NOTE — Telephone Encounter (Signed)
Give thirty plus two ref

## 2017-11-16 NOTE — Telephone Encounter (Signed)
Spoke with niece who will talk with Marcello Moores and see if he is willing to start medication

## 2017-11-17 ENCOUNTER — Encounter: Payer: Self-pay | Admitting: Family Medicine

## 2017-11-21 ENCOUNTER — Other Ambulatory Visit: Payer: Self-pay | Admitting: Family Medicine

## 2017-11-21 DIAGNOSIS — W19XXXD Unspecified fall, subsequent encounter: Secondary | ICD-10-CM | POA: Diagnosis not present

## 2017-11-21 DIAGNOSIS — I69398 Other sequelae of cerebral infarction: Secondary | ICD-10-CM | POA: Diagnosis not present

## 2017-11-21 DIAGNOSIS — Z7984 Long term (current) use of oral hypoglycemic drugs: Secondary | ICD-10-CM | POA: Diagnosis not present

## 2017-11-21 DIAGNOSIS — M170 Bilateral primary osteoarthritis of knee: Secondary | ICD-10-CM | POA: Diagnosis not present

## 2017-11-21 DIAGNOSIS — I5020 Unspecified systolic (congestive) heart failure: Secondary | ICD-10-CM | POA: Diagnosis not present

## 2017-11-21 DIAGNOSIS — I110 Hypertensive heart disease with heart failure: Secondary | ICD-10-CM | POA: Diagnosis not present

## 2017-11-21 DIAGNOSIS — E119 Type 2 diabetes mellitus without complications: Secondary | ICD-10-CM | POA: Diagnosis not present

## 2017-11-21 DIAGNOSIS — G464 Cerebellar stroke syndrome: Secondary | ICD-10-CM | POA: Diagnosis not present

## 2017-11-21 DIAGNOSIS — S12191D Other nondisplaced fracture of second cervical vertebra, subsequent encounter for fracture with routine healing: Secondary | ICD-10-CM | POA: Diagnosis not present

## 2017-11-21 DIAGNOSIS — J449 Chronic obstructive pulmonary disease, unspecified: Secondary | ICD-10-CM | POA: Diagnosis not present

## 2017-11-23 ENCOUNTER — Telehealth: Payer: Self-pay | Admitting: *Deleted

## 2017-11-23 NOTE — Telephone Encounter (Signed)
Fax from [facility]. They cannot send senna because the med is on manufacturer back order. Can this med be changed.   Fax number  [phone]

## 2017-11-23 NOTE — Telephone Encounter (Signed)
Change to colace 200 mg daily

## 2017-11-23 NOTE — Telephone Encounter (Signed)
Order faxed.

## 2017-11-23 NOTE — Telephone Encounter (Signed)
Rx written awaiting signature. 

## 2017-11-24 ENCOUNTER — Telehealth: Payer: Self-pay | Admitting: Family Medicine

## 2017-11-24 ENCOUNTER — Other Ambulatory Visit: Payer: Self-pay | Admitting: *Deleted

## 2017-11-24 MED ORDER — ACETAMINOPHEN 325 MG PO TABS: 650.0000 mg | ORAL_TABLET | ORAL | 99 refills | Status: DC | PRN

## 2017-11-24 NOTE — Telephone Encounter (Signed)
150 tabs

## 2017-11-24 NOTE — Telephone Encounter (Signed)
[facility]-Requesting refill for patient on tylenol 325 mg to be called into [facility].

## 2017-11-24 NOTE — Telephone Encounter (Signed)
Med sent to pharm. Tried to call [facility] multiple times and kept getting hung up on.

## 2017-11-24 NOTE — Telephone Encounter (Signed)
Pt takes 2 every 4 hours as needed. rx has not been sent through epic in the past. What quantity do you want to give him?

## 2017-11-24 NOTE — Telephone Encounter (Signed)
Ok ref one yr 

## 2017-11-25 DIAGNOSIS — I69398 Other sequelae of cerebral infarction: Secondary | ICD-10-CM | POA: Diagnosis not present

## 2017-11-25 DIAGNOSIS — M170 Bilateral primary osteoarthritis of knee: Secondary | ICD-10-CM | POA: Diagnosis not present

## 2017-11-25 DIAGNOSIS — W19XXXD Unspecified fall, subsequent encounter: Secondary | ICD-10-CM | POA: Diagnosis not present

## 2017-11-25 DIAGNOSIS — G464 Cerebellar stroke syndrome: Secondary | ICD-10-CM | POA: Diagnosis not present

## 2017-11-25 DIAGNOSIS — S12191D Other nondisplaced fracture of second cervical vertebra, subsequent encounter for fracture with routine healing: Secondary | ICD-10-CM | POA: Diagnosis not present

## 2017-11-25 DIAGNOSIS — I110 Hypertensive heart disease with heart failure: Secondary | ICD-10-CM | POA: Diagnosis not present

## 2017-11-29 DIAGNOSIS — G464 Cerebellar stroke syndrome: Secondary | ICD-10-CM | POA: Diagnosis not present

## 2017-11-29 DIAGNOSIS — M170 Bilateral primary osteoarthritis of knee: Secondary | ICD-10-CM | POA: Diagnosis not present

## 2017-11-29 DIAGNOSIS — W19XXXD Unspecified fall, subsequent encounter: Secondary | ICD-10-CM | POA: Diagnosis not present

## 2017-11-29 DIAGNOSIS — I69398 Other sequelae of cerebral infarction: Secondary | ICD-10-CM | POA: Diagnosis not present

## 2017-11-29 DIAGNOSIS — I110 Hypertensive heart disease with heart failure: Secondary | ICD-10-CM | POA: Diagnosis not present

## 2017-11-29 DIAGNOSIS — S12191D Other nondisplaced fracture of second cervical vertebra, subsequent encounter for fracture with routine healing: Secondary | ICD-10-CM | POA: Diagnosis not present

## 2017-12-01 ENCOUNTER — Telehealth: Payer: Self-pay | Admitting: *Deleted

## 2017-12-01 MED ORDER — TRAZODONE HCL 100 MG PO TABS: 100.0000 mg | ORAL_TABLET | Freq: Every day | ORAL | 5 refills | Status: AC

## 2017-12-01 NOTE — Telephone Encounter (Signed)
Prescription sent electronically to pharmacy. Med tech from [facility] notified and verbalized understanding.

## 2017-12-01 NOTE — Telephone Encounter (Signed)
See note from [facility]

## 2017-12-01 NOTE — Telephone Encounter (Signed)
We can try increasing trazadone to 100 qhs, try to discourage daytime napping if possible

## 2017-12-02 DIAGNOSIS — W19XXXD Unspecified fall, subsequent encounter: Secondary | ICD-10-CM | POA: Diagnosis not present

## 2017-12-02 DIAGNOSIS — I69398 Other sequelae of cerebral infarction: Secondary | ICD-10-CM | POA: Diagnosis not present

## 2017-12-02 DIAGNOSIS — I110 Hypertensive heart disease with heart failure: Secondary | ICD-10-CM | POA: Diagnosis not present

## 2017-12-02 DIAGNOSIS — S12191D Other nondisplaced fracture of second cervical vertebra, subsequent encounter for fracture with routine healing: Secondary | ICD-10-CM | POA: Diagnosis not present

## 2017-12-02 DIAGNOSIS — G464 Cerebellar stroke syndrome: Secondary | ICD-10-CM | POA: Diagnosis not present

## 2017-12-02 DIAGNOSIS — M170 Bilateral primary osteoarthritis of knee: Secondary | ICD-10-CM | POA: Diagnosis not present

## 2017-12-06 DIAGNOSIS — J449 Chronic obstructive pulmonary disease, unspecified: Secondary | ICD-10-CM | POA: Diagnosis not present

## 2017-12-06 DIAGNOSIS — I110 Hypertensive heart disease with heart failure: Secondary | ICD-10-CM | POA: Diagnosis not present

## 2017-12-06 DIAGNOSIS — E119 Type 2 diabetes mellitus without complications: Secondary | ICD-10-CM | POA: Diagnosis not present

## 2017-12-06 DIAGNOSIS — M170 Bilateral primary osteoarthritis of knee: Secondary | ICD-10-CM | POA: Diagnosis not present

## 2017-12-06 DIAGNOSIS — I69398 Other sequelae of cerebral infarction: Secondary | ICD-10-CM | POA: Diagnosis not present

## 2017-12-06 DIAGNOSIS — G464 Cerebellar stroke syndrome: Secondary | ICD-10-CM | POA: Diagnosis not present

## 2017-12-06 DIAGNOSIS — I5020 Unspecified systolic (congestive) heart failure: Secondary | ICD-10-CM | POA: Diagnosis not present

## 2017-12-07 DIAGNOSIS — G464 Cerebellar stroke syndrome: Secondary | ICD-10-CM | POA: Diagnosis not present

## 2017-12-07 DIAGNOSIS — I69398 Other sequelae of cerebral infarction: Secondary | ICD-10-CM | POA: Diagnosis not present

## 2017-12-07 DIAGNOSIS — S12191D Other nondisplaced fracture of second cervical vertebra, subsequent encounter for fracture with routine healing: Secondary | ICD-10-CM | POA: Diagnosis not present

## 2017-12-07 DIAGNOSIS — I110 Hypertensive heart disease with heart failure: Secondary | ICD-10-CM | POA: Diagnosis not present

## 2017-12-07 DIAGNOSIS — M170 Bilateral primary osteoarthritis of knee: Secondary | ICD-10-CM | POA: Diagnosis not present

## 2017-12-07 DIAGNOSIS — W19XXXD Unspecified fall, subsequent encounter: Secondary | ICD-10-CM | POA: Diagnosis not present

## 2017-12-09 DIAGNOSIS — G464 Cerebellar stroke syndrome: Secondary | ICD-10-CM | POA: Diagnosis not present

## 2017-12-09 DIAGNOSIS — W19XXXD Unspecified fall, subsequent encounter: Secondary | ICD-10-CM | POA: Diagnosis not present

## 2017-12-09 DIAGNOSIS — S12191D Other nondisplaced fracture of second cervical vertebra, subsequent encounter for fracture with routine healing: Secondary | ICD-10-CM | POA: Diagnosis not present

## 2017-12-09 DIAGNOSIS — I69398 Other sequelae of cerebral infarction: Secondary | ICD-10-CM | POA: Diagnosis not present

## 2017-12-09 DIAGNOSIS — I110 Hypertensive heart disease with heart failure: Secondary | ICD-10-CM | POA: Diagnosis not present

## 2017-12-09 DIAGNOSIS — M170 Bilateral primary osteoarthritis of knee: Secondary | ICD-10-CM | POA: Diagnosis not present

## 2017-12-12 DIAGNOSIS — M170 Bilateral primary osteoarthritis of knee: Secondary | ICD-10-CM | POA: Diagnosis not present

## 2017-12-12 DIAGNOSIS — I110 Hypertensive heart disease with heart failure: Secondary | ICD-10-CM | POA: Diagnosis not present

## 2017-12-12 DIAGNOSIS — S12191D Other nondisplaced fracture of second cervical vertebra, subsequent encounter for fracture with routine healing: Secondary | ICD-10-CM | POA: Diagnosis not present

## 2017-12-12 DIAGNOSIS — I69398 Other sequelae of cerebral infarction: Secondary | ICD-10-CM | POA: Diagnosis not present

## 2017-12-12 DIAGNOSIS — W19XXXD Unspecified fall, subsequent encounter: Secondary | ICD-10-CM | POA: Diagnosis not present

## 2017-12-12 DIAGNOSIS — G464 Cerebellar stroke syndrome: Secondary | ICD-10-CM | POA: Diagnosis not present

## 2017-12-14 DIAGNOSIS — W19XXXD Unspecified fall, subsequent encounter: Secondary | ICD-10-CM | POA: Diagnosis not present

## 2017-12-14 DIAGNOSIS — M170 Bilateral primary osteoarthritis of knee: Secondary | ICD-10-CM | POA: Diagnosis not present

## 2017-12-14 DIAGNOSIS — S12191D Other nondisplaced fracture of second cervical vertebra, subsequent encounter for fracture with routine healing: Secondary | ICD-10-CM | POA: Diagnosis not present

## 2017-12-14 DIAGNOSIS — I69398 Other sequelae of cerebral infarction: Secondary | ICD-10-CM | POA: Diagnosis not present

## 2017-12-14 DIAGNOSIS — I110 Hypertensive heart disease with heart failure: Secondary | ICD-10-CM | POA: Diagnosis not present

## 2017-12-14 DIAGNOSIS — G464 Cerebellar stroke syndrome: Secondary | ICD-10-CM | POA: Diagnosis not present

## 2017-12-19 ENCOUNTER — Encounter: Payer: Self-pay | Admitting: Family Medicine

## 2017-12-19 ENCOUNTER — Ambulatory Visit (INDEPENDENT_AMBULATORY_CARE_PROVIDER_SITE_OTHER): Payer: Medicare Other | Admitting: Family Medicine

## 2017-12-19 DIAGNOSIS — W19XXXD Unspecified fall, subsequent encounter: Secondary | ICD-10-CM | POA: Diagnosis not present

## 2017-12-19 DIAGNOSIS — M170 Bilateral primary osteoarthritis of knee: Secondary | ICD-10-CM | POA: Diagnosis not present

## 2017-12-19 DIAGNOSIS — I69398 Other sequelae of cerebral infarction: Secondary | ICD-10-CM | POA: Diagnosis not present

## 2017-12-19 DIAGNOSIS — G464 Cerebellar stroke syndrome: Secondary | ICD-10-CM | POA: Diagnosis not present

## 2017-12-19 DIAGNOSIS — S12191D Other nondisplaced fracture of second cervical vertebra, subsequent encounter for fracture with routine healing: Secondary | ICD-10-CM | POA: Diagnosis not present

## 2017-12-19 DIAGNOSIS — I110 Hypertensive heart disease with heart failure: Secondary | ICD-10-CM | POA: Diagnosis not present

## 2017-12-19 NOTE — Progress Notes (Signed)
   Subjective:    Patient ID: Christian Thomas, male    DOB: [birth year], 84 y.o.   MRN: [id number]  Diabetes  He presents for his follow-up diabetic visit. He has type 2 diabetes mellitus. Risk factors for coronary artery disease include dyslipidemia, diabetes mellitus, hypertension and sedentary lifestyle.   Cold sx for a few weeks.  No fever occasional slight wheeze.  Treated with nebulizer.  Patient claims compliance with diabetes medication. No obvious side effects. Reports no substantial low sugar spells. Most numbers are generally in good range when checked fasting. Generally does not miss a dose of medication. Watching diabetic diet closely  Patient compliant with insomnia medication. Generally takes most nights. No obvious morning drowsiness. Definitely helps patient sleep. Without it patient states would not get a good nights rest.   Ongoing challenges with knee arthritis.  Patient continues to take lipid medication regularly. No obvious side effects from it. Generally does not miss a dose. Prior blood work results are reviewed with patient. Patient continues to work on fat intake in diet  Patient has had progressive weakness.  Recently in a wheelchair more more.  Also complicated by cervical spine fracture several months ago with protracted bracing which affected patient's mobility   Review of Systems No headache, no major weight loss or weight gain, no chest pain no back pain abdominal pain no change in bowel habits complete ROS otherwise negative     Objective:   Physical Exam  Alert and oriented, vitals reviewed and stable, NAD ENT-TM's and ext canals WNL bilat via otoscopic exam Soft palate, tonsils and post pharynx WNL via oropharyngeal exam Neck-symmetric, no masses; thyroid nonpalpable and nontender Pulmonary-no tachypnea or accessory muscle use; Clear without wheezes via auscultation Card--no abnrml murmurs, rhythm reg and rate WNL Carotid pulses symmetric, without  bruits  Patient was prepped, draped, anesthetized, both knees were injected with 1 cc Depo-Medrol and 2 cc of Xylocaine      Assessment & Plan:  Impression 1 type 2 diabetes decent control discussed maintain same meds  2.  Insomnia ongoing challenges continued adjustment of medications  3.  Hypertension decent control discussed maintain same meds  4.  Hyperlipidemia discussed to maintain the same meds compliance discussed prior blood work reviewed  5.  Severe bilateral knee arthritis knees injected this has helped patient in the past  6.  Progressive weakness.  Physical therapy utilized.  They have been working with the patient to help with mobility and strength.

## 2017-12-22 ENCOUNTER — Ambulatory Visit: Payer: Medicare Other | Admitting: Family Medicine

## 2017-12-22 DIAGNOSIS — W19XXXD Unspecified fall, subsequent encounter: Secondary | ICD-10-CM | POA: Diagnosis not present

## 2017-12-22 DIAGNOSIS — I110 Hypertensive heart disease with heart failure: Secondary | ICD-10-CM | POA: Diagnosis not present

## 2017-12-22 DIAGNOSIS — I69398 Other sequelae of cerebral infarction: Secondary | ICD-10-CM | POA: Diagnosis not present

## 2017-12-22 DIAGNOSIS — M170 Bilateral primary osteoarthritis of knee: Secondary | ICD-10-CM | POA: Diagnosis not present

## 2017-12-22 DIAGNOSIS — S12191D Other nondisplaced fracture of second cervical vertebra, subsequent encounter for fracture with routine healing: Secondary | ICD-10-CM | POA: Diagnosis not present

## 2017-12-22 DIAGNOSIS — G464 Cerebellar stroke syndrome: Secondary | ICD-10-CM | POA: Diagnosis not present

## 2017-12-25 MED ORDER — METHYLPREDNISOLONE ACETATE 40 MG/ML IJ SUSP: 40.0000 mg | Freq: Once | INTRAMUSCULAR | Status: DC

## 2017-12-26 ENCOUNTER — Telehealth: Payer: Self-pay | Admitting: Family Medicine

## 2017-12-26 NOTE — Telephone Encounter (Signed)
So are they still requesting a chest xray?? I think they can just keep an eye on the rash,m or, conversely they can have [place] see it if they think its bad enough

## 2017-12-26 NOTE — Telephone Encounter (Signed)
We need our nurse to talk directly with their nurse, and get a list of all the symtoms pt having, then go ahead and aouthorize chest xray

## 2017-12-26 NOTE — Telephone Encounter (Signed)
Christian Thomas states he has has a purple/red area on left side-under the ribs-they don't know if he scratched off a skin tag(has a lot in this area) or if it is the beginnings of shingles ut he has no cough or congestion or fever

## 2017-12-26 NOTE — Telephone Encounter (Signed)
Spoke with Patient's niece(POA) who verbalized understanding and stated she will call back in the morning to schedule office visit if no better

## 2017-12-26 NOTE — Telephone Encounter (Signed)
Can apply bid antibiotic oint and if persists or worsens can ck in office

## 2017-12-26 NOTE — Telephone Encounter (Signed)
They did not feel he needed the portable chest xray and feel the pain is from the rash on his chest

## 2017-12-26 NOTE — Telephone Encounter (Signed)
Left message for nurse at [facility] to return call

## 2017-12-26 NOTE — Telephone Encounter (Signed)
Pt is needing a mobile xray of his chest. Pt states that his left lung hurts. Pt states that it started at the bottom but now it is up further. This has been going on since Friday. Please advise.

## 2017-12-27 ENCOUNTER — Encounter: Payer: Self-pay | Admitting: Family Medicine

## 2017-12-27 ENCOUNTER — Ambulatory Visit (INDEPENDENT_AMBULATORY_CARE_PROVIDER_SITE_OTHER): Payer: Medicare Other | Admitting: Family Medicine

## 2017-12-27 VITALS — BP 128/88 | Temp 98.1°F | Ht 72.0 in

## 2017-12-27 DIAGNOSIS — B029 Zoster without complications: Secondary | ICD-10-CM

## 2017-12-27 MED ORDER — VALACYCLOVIR HCL 1 G PO TABS: 1000.0000 mg | ORAL_TABLET | Freq: Three times a day (TID) | ORAL | 0 refills | Status: AC

## 2017-12-27 MED ORDER — MUPIROCIN 2 % EX OINT: TOPICAL_OINTMENT | CUTANEOUS | 0 refills | Status: DC

## 2017-12-27 NOTE — Progress Notes (Signed)
   Subjective:    Patient ID: Christian Thomas, male    DOB: 1915/02/28, 84 y.o.   MRN: [id number]  HPI Pt in today with niece. Pt has place on left side and the left side of his chest is hurting also. Pts niece states there is a rash on his left side also. Pt states it feels like a "rat knawing on a bone". Pt has used pain pill and Sarna cream.  Pain is severe at times burning Comes and goes worse with movement worse with cough classic shingles rash  Review of Systems No headache, no major weight loss or weight gain, no chest pain no back pain abdominal pain no change in bowel habits complete ROS otherwise negative     Objective:   Physical Exam Alert vitals stable, NAD. Blood pressure good on repeat. HEENT normal. Lungs clear. Heart regular rate and rhythm.  Left flank classic shingles rash      Assessment & Plan:  Impression shingles discussed plan initiate Valtrex 1 g 3 times daily.  Increase tramadol to 3 times daily local measures discussed

## 2017-12-28 ENCOUNTER — Other Ambulatory Visit: Payer: Self-pay | Admitting: Family Medicine

## 2017-12-28 DIAGNOSIS — M170 Bilateral primary osteoarthritis of knee: Secondary | ICD-10-CM | POA: Diagnosis not present

## 2017-12-28 DIAGNOSIS — I69398 Other sequelae of cerebral infarction: Secondary | ICD-10-CM | POA: Diagnosis not present

## 2017-12-28 DIAGNOSIS — I110 Hypertensive heart disease with heart failure: Secondary | ICD-10-CM | POA: Diagnosis not present

## 2017-12-28 DIAGNOSIS — W19XXXD Unspecified fall, subsequent encounter: Secondary | ICD-10-CM | POA: Diagnosis not present

## 2017-12-28 DIAGNOSIS — G464 Cerebellar stroke syndrome: Secondary | ICD-10-CM | POA: Diagnosis not present

## 2017-12-28 DIAGNOSIS — S12191D Other nondisplaced fracture of second cervical vertebra, subsequent encounter for fracture with routine healing: Secondary | ICD-10-CM | POA: Diagnosis not present

## 2017-12-29 DIAGNOSIS — L84 Corns and callosities: Secondary | ICD-10-CM | POA: Diagnosis not present

## 2017-12-29 DIAGNOSIS — E1151 Type 2 diabetes mellitus with diabetic peripheral angiopathy without gangrene: Secondary | ICD-10-CM | POA: Diagnosis not present

## 2018-01-06 ENCOUNTER — Telehealth: Payer: Self-pay | Admitting: Family Medicine

## 2018-01-06 MED ORDER — TRAMADOL HCL 50 MG PO TABS: 50.0000 mg | ORAL_TABLET | Freq: Three times a day (TID) | ORAL | 5 refills | Status: DC

## 2018-01-06 NOTE — Telephone Encounter (Signed)
Pt is needing a hard script of Tramadol per [facility].

## 2018-01-06 NOTE — Telephone Encounter (Signed)
Numb 90 one tid five ref

## 2018-01-06 NOTE — Telephone Encounter (Signed)
Script ready. Pt notified.  

## 2018-01-06 NOTE — Telephone Encounter (Signed)
Last seen 12/27/17 

## 2018-01-25 DIAGNOSIS — Z20828 Contact with and (suspected) exposure to other viral communicable diseases: Secondary | ICD-10-CM | POA: Diagnosis not present

## 2018-02-03 ENCOUNTER — Telehealth: Payer: Self-pay | Admitting: Family Medicine

## 2018-02-03 NOTE — Telephone Encounter (Signed)
ok 

## 2018-02-03 NOTE — Telephone Encounter (Signed)
Contacted [facility] and they need the order faxed over. Order written on rx pad awaiting signature

## 2018-02-03 NOTE — Telephone Encounter (Signed)
[facility] called regarding face to face that was sent back earlier in the week.  They are requesting this ASAP.

## 2018-02-03 NOTE — Telephone Encounter (Signed)
Spoke with Donavan Foil at [facility] to get reason for UA. Miranda stated that his daughter wanted it done due to a fall the other day. No symptoms of any UTI per Miranda.

## 2018-02-06 DIAGNOSIS — N390 Urinary tract infection, site not specified: Secondary | ICD-10-CM | POA: Diagnosis not present

## 2018-02-07 ENCOUNTER — Other Ambulatory Visit: Payer: Self-pay | Admitting: Family Medicine

## 2018-02-08 ENCOUNTER — Telehealth: Payer: Self-pay | Admitting: Family Medicine

## 2018-02-08 DIAGNOSIS — E119 Type 2 diabetes mellitus without complications: Secondary | ICD-10-CM | POA: Diagnosis not present

## 2018-02-08 DIAGNOSIS — J449 Chronic obstructive pulmonary disease, unspecified: Secondary | ICD-10-CM | POA: Diagnosis not present

## 2018-02-08 DIAGNOSIS — Z7984 Long term (current) use of oral hypoglycemic drugs: Secondary | ICD-10-CM | POA: Diagnosis not present

## 2018-02-08 DIAGNOSIS — I110 Hypertensive heart disease with heart failure: Secondary | ICD-10-CM | POA: Diagnosis not present

## 2018-02-08 DIAGNOSIS — I5020 Unspecified systolic (congestive) heart failure: Secondary | ICD-10-CM | POA: Diagnosis not present

## 2018-02-08 DIAGNOSIS — M170 Bilateral primary osteoarthritis of knee: Secondary | ICD-10-CM | POA: Diagnosis not present

## 2018-02-08 DIAGNOSIS — R296 Repeated falls: Secondary | ICD-10-CM | POA: Diagnosis not present

## 2018-02-08 NOTE — Telephone Encounter (Signed)
sure

## 2018-02-08 NOTE — Telephone Encounter (Signed)
Please advise 

## 2018-02-08 NOTE — Telephone Encounter (Signed)
I spoke with Amy she is aware of all.

## 2018-02-08 NOTE — Telephone Encounter (Signed)
Amy, PT with [name], is requesting verbal order for PT for 2 times a week for 5 weeks.

## 2018-02-13 ENCOUNTER — Other Ambulatory Visit: Payer: Self-pay | Admitting: Family Medicine

## 2018-02-14 DIAGNOSIS — R296 Repeated falls: Secondary | ICD-10-CM | POA: Diagnosis not present

## 2018-02-14 DIAGNOSIS — I110 Hypertensive heart disease with heart failure: Secondary | ICD-10-CM | POA: Diagnosis not present

## 2018-02-14 DIAGNOSIS — I5020 Unspecified systolic (congestive) heart failure: Secondary | ICD-10-CM | POA: Diagnosis not present

## 2018-02-14 DIAGNOSIS — M170 Bilateral primary osteoarthritis of knee: Secondary | ICD-10-CM | POA: Diagnosis not present

## 2018-02-14 DIAGNOSIS — E119 Type 2 diabetes mellitus without complications: Secondary | ICD-10-CM | POA: Diagnosis not present

## 2018-02-14 DIAGNOSIS — J449 Chronic obstructive pulmonary disease, unspecified: Secondary | ICD-10-CM | POA: Diagnosis not present

## 2018-02-16 DIAGNOSIS — I5020 Unspecified systolic (congestive) heart failure: Secondary | ICD-10-CM | POA: Diagnosis not present

## 2018-02-16 DIAGNOSIS — M170 Bilateral primary osteoarthritis of knee: Secondary | ICD-10-CM | POA: Diagnosis not present

## 2018-02-16 DIAGNOSIS — J449 Chronic obstructive pulmonary disease, unspecified: Secondary | ICD-10-CM | POA: Diagnosis not present

## 2018-02-16 DIAGNOSIS — I110 Hypertensive heart disease with heart failure: Secondary | ICD-10-CM | POA: Diagnosis not present

## 2018-02-16 DIAGNOSIS — R296 Repeated falls: Secondary | ICD-10-CM | POA: Diagnosis not present

## 2018-02-16 DIAGNOSIS — E119 Type 2 diabetes mellitus without complications: Secondary | ICD-10-CM | POA: Diagnosis not present

## 2018-02-16 NOTE — Telephone Encounter (Signed)
Patient needs lipid liver metabolic 7 D8K, CBC before next visit, please verify with the pharmacy that he is on all of these medicines if so he may have a 90-day supply patient has office visit in May

## 2018-02-16 NOTE — Telephone Encounter (Signed)
Pharmacy fax wanting 90 day supply for following meds: Glipizide 2.5mg ; Metformin 500mg ; Lovastatin 40mg . Pt is resident of [place]

## 2018-02-17 ENCOUNTER — Other Ambulatory Visit: Payer: Self-pay | Admitting: Family Medicine

## 2018-02-17 DIAGNOSIS — E118 Type 2 diabetes mellitus with unspecified complications: Secondary | ICD-10-CM

## 2018-02-17 DIAGNOSIS — Z0000 Encounter for general adult medical examination without abnormal findings: Secondary | ICD-10-CM

## 2018-02-17 MED ORDER — LOVASTATIN 40 MG PO TABS: ORAL_TABLET | ORAL | 1 refills | Status: AC

## 2018-02-17 MED ORDER — GLIPIZIDE ER 2.5 MG PO TB24: 2.5000 mg | ORAL_TABLET | Freq: Every day | ORAL | 1 refills | Status: AC

## 2018-02-17 MED ORDER — METFORMIN HCL 500 MG PO TABS: ORAL_TABLET | ORAL | 1 refills | Status: DC

## 2018-02-17 NOTE — Telephone Encounter (Signed)
Labs placed and meds verified with [facility].

## 2018-02-20 DIAGNOSIS — J449 Chronic obstructive pulmonary disease, unspecified: Secondary | ICD-10-CM | POA: Diagnosis not present

## 2018-02-20 DIAGNOSIS — I110 Hypertensive heart disease with heart failure: Secondary | ICD-10-CM | POA: Diagnosis not present

## 2018-02-20 DIAGNOSIS — R296 Repeated falls: Secondary | ICD-10-CM | POA: Diagnosis not present

## 2018-02-20 DIAGNOSIS — E119 Type 2 diabetes mellitus without complications: Secondary | ICD-10-CM | POA: Diagnosis not present

## 2018-02-20 DIAGNOSIS — M170 Bilateral primary osteoarthritis of knee: Secondary | ICD-10-CM | POA: Diagnosis not present

## 2018-02-20 DIAGNOSIS — I5020 Unspecified systolic (congestive) heart failure: Secondary | ICD-10-CM | POA: Diagnosis not present

## 2018-02-22 DIAGNOSIS — J449 Chronic obstructive pulmonary disease, unspecified: Secondary | ICD-10-CM | POA: Diagnosis not present

## 2018-02-22 DIAGNOSIS — I110 Hypertensive heart disease with heart failure: Secondary | ICD-10-CM | POA: Diagnosis not present

## 2018-02-22 DIAGNOSIS — I5020 Unspecified systolic (congestive) heart failure: Secondary | ICD-10-CM | POA: Diagnosis not present

## 2018-02-22 DIAGNOSIS — M170 Bilateral primary osteoarthritis of knee: Secondary | ICD-10-CM | POA: Diagnosis not present

## 2018-02-22 DIAGNOSIS — E119 Type 2 diabetes mellitus without complications: Secondary | ICD-10-CM | POA: Diagnosis not present

## 2018-02-22 DIAGNOSIS — R296 Repeated falls: Secondary | ICD-10-CM | POA: Diagnosis not present

## 2018-02-24 ENCOUNTER — Encounter: Payer: Self-pay | Admitting: Family Medicine

## 2018-02-24 ENCOUNTER — Ambulatory Visit (INDEPENDENT_AMBULATORY_CARE_PROVIDER_SITE_OTHER): Payer: Medicare Other | Admitting: Family Medicine

## 2018-02-24 VITALS — BP 122/64 | Ht 72.0 in

## 2018-02-24 DIAGNOSIS — E118 Type 2 diabetes mellitus with unspecified complications: Secondary | ICD-10-CM | POA: Diagnosis not present

## 2018-02-24 DIAGNOSIS — M170 Bilateral primary osteoarthritis of knee: Secondary | ICD-10-CM | POA: Diagnosis not present

## 2018-02-24 DIAGNOSIS — Z0000 Encounter for general adult medical examination without abnormal findings: Secondary | ICD-10-CM | POA: Diagnosis not present

## 2018-02-24 DIAGNOSIS — R2681 Unsteadiness on feet: Secondary | ICD-10-CM | POA: Diagnosis not present

## 2018-02-24 DIAGNOSIS — R531 Weakness: Secondary | ICD-10-CM | POA: Diagnosis not present

## 2018-02-24 NOTE — Progress Notes (Signed)
   Subjective:    Patient ID: Christian Thomas, male    DOB: [birth year], 84 y.o.   MRN: [id number]  HPIpt arrives with niece Caryl Asp and [name]'s husband Timmothy Sours to get order for PT services. Pt having weakness in both legs.  Patient having ongoing challenges.  Recently had a fall.  No major injury with this.  History of progressive severe arthritis of the knees.  Complicated by neuropathy.  Also complicated by legal blindness.  Now having more difficulty transferring to his wheelchair.  Facility would like to continue physical therapy    Review of Systems No headache, no major weight loss or weight gain, no chest pain no back pain abdominal pain no change in bowel habits complete ROS otherwise negative     Objective:   Physical Exam  Alert and oriented, vitals reviewed and stable, NAD ENT-TM's and ext canals WNL bilat via otoscopic exam Soft palate, tonsils and post pharynx WNL via oropharyngeal exam Neck-symmetric, no masses; thyroid nonpalpable and nontender Pulmonary-no tachypnea or accessory muscle use; Clear without wheezes via auscultation Card--no abnrml murmurs, rhythm reg and rate WNL Carotid pulses symmetric, without bruits   Knees bilateral substantial arthritis.  Patient barely able to stand on his own.  Unable to take steps without assistance      Assessment & Plan:  Impression progressive weakness.  Long discussion held.  Notes reviewed.  Physical therapy proposal and ongoing management is appropriate.  It may by back enough strength to allow better transfer from bed to wheelchair.  This would also reduce risk of falls.  Maintain same interventions.  Follow-up regular appointment  Will work on paperwork to authorize ongoing physical therapy intervention.  Definitely appropriate.  Needed in order to try to strengthen legs in order to assist in gait  Greater than 50% of this 25 minute face to face visit was spent in counseling and discussion and coordination of care regarding  the above diagnosis/diagnosies

## 2018-02-25 LAB — BASIC METABOLIC PANEL
BUN / CREAT RATIO: 19 (ref 10–24)
BUN: 20 mg/dL (ref 10–36)
CALCIUM: 9.4 mg/dL (ref 8.6–10.2)
CHLORIDE: 100 mmol/L (ref 96–106)
CO2: 25 mmol/L (ref 20–29)
CREATININE: 1.08 mg/dL (ref 0.76–1.27)
GFR, EST AFRICAN AMERICAN: 65 mL/min/{1.73_m2} (ref >59)
GFR, EST NON AFRICAN AMERICAN: 56 mL/min/{1.73_m2} — AB (ref >59)
Glucose: 101 mg/dL — ABNORMAL HIGH (ref 65–99)
Potassium: 5 mmol/L (ref 3.5–5.2)
Sodium: 140 mmol/L (ref 134–144)

## 2018-02-25 LAB — HEPATIC FUNCTION PANEL
ALK PHOS: 70 IU/L (ref 39–117)
ALT: 7 IU/L (ref 0–44)
AST: 8 IU/L (ref 0–40)
Albumin: 4 g/dL (ref 3.2–4.6)
BILIRUBIN TOTAL: 0.2 mg/dL (ref 0.0–1.2)
BILIRUBIN, DIRECT: 0.09 mg/dL (ref 0.00–0.40)
Total Protein: 5.9 g/dL — ABNORMAL LOW (ref 6.0–8.5)

## 2018-02-25 LAB — LIPID PANEL
CHOL/HDL RATIO: 2.3 ratio (ref 0.0–5.0)
Cholesterol, Total: 139 mg/dL (ref 100–199)
HDL: 60 mg/dL (ref >39)
LDL Calculated: 64 mg/dL (ref 0–99)
Triglycerides: 75 mg/dL (ref 0–149)
VLDL CHOLESTEROL CAL: 15 mg/dL (ref 5–40)

## 2018-02-25 LAB — CBC WITH DIFFERENTIAL/PLATELET
Basophils Absolute: 0 10*3/uL (ref 0.0–0.2)
Basos: 1 %
EOS (ABSOLUTE): 0.2 10*3/uL (ref 0.0–0.4)
EOS: 3 %
HEMATOCRIT: 34.8 % — AB (ref 37.5–51.0)
HEMOGLOBIN: 12.1 g/dL — AB (ref 13.0–17.7)
Immature Grans (Abs): 0 10*3/uL (ref 0.0–0.1)
Immature Granulocytes: 0 %
LYMPHS ABS: 2.3 10*3/uL (ref 0.7–3.1)
Lymphs: 39 %
MCH: 31.2 pg (ref 26.6–33.0)
MCHC: 34.8 g/dL (ref 31.5–35.7)
MCV: 90 fL (ref 79–97)
MONOCYTES: 8 %
Monocytes Absolute: 0.5 10*3/uL (ref 0.1–0.9)
NEUTROS ABS: 3 10*3/uL (ref 1.4–7.0)
Neutrophils: 49 %
Platelets: 287 10*3/uL (ref 150–379)
RBC: 3.88 x10E6/uL — AB (ref 4.14–5.80)
RDW: 14.5 % (ref 12.3–15.4)
WBC: 5.9 10*3/uL (ref 3.4–10.8)

## 2018-02-25 LAB — HEMOGLOBIN A1C
ESTIMATED AVERAGE GLUCOSE: 131 mg/dL
Hgb A1c MFr Bld: 6.2 % — ABNORMAL HIGH (ref 4.8–5.6)

## 2018-02-27 DIAGNOSIS — R296 Repeated falls: Secondary | ICD-10-CM | POA: Diagnosis not present

## 2018-02-27 DIAGNOSIS — I5020 Unspecified systolic (congestive) heart failure: Secondary | ICD-10-CM | POA: Diagnosis not present

## 2018-02-27 DIAGNOSIS — M170 Bilateral primary osteoarthritis of knee: Secondary | ICD-10-CM | POA: Diagnosis not present

## 2018-02-27 DIAGNOSIS — I110 Hypertensive heart disease with heart failure: Secondary | ICD-10-CM | POA: Diagnosis not present

## 2018-02-27 DIAGNOSIS — J449 Chronic obstructive pulmonary disease, unspecified: Secondary | ICD-10-CM | POA: Diagnosis not present

## 2018-02-27 DIAGNOSIS — E119 Type 2 diabetes mellitus without complications: Secondary | ICD-10-CM | POA: Diagnosis not present

## 2018-03-01 DIAGNOSIS — R296 Repeated falls: Secondary | ICD-10-CM | POA: Diagnosis not present

## 2018-03-01 DIAGNOSIS — J449 Chronic obstructive pulmonary disease, unspecified: Secondary | ICD-10-CM | POA: Diagnosis not present

## 2018-03-01 DIAGNOSIS — E119 Type 2 diabetes mellitus without complications: Secondary | ICD-10-CM | POA: Diagnosis not present

## 2018-03-01 DIAGNOSIS — M170 Bilateral primary osteoarthritis of knee: Secondary | ICD-10-CM | POA: Diagnosis not present

## 2018-03-01 DIAGNOSIS — I110 Hypertensive heart disease with heart failure: Secondary | ICD-10-CM | POA: Diagnosis not present

## 2018-03-02 DIAGNOSIS — M170 Bilateral primary osteoarthritis of knee: Secondary | ICD-10-CM | POA: Diagnosis not present

## 2018-03-02 DIAGNOSIS — I110 Hypertensive heart disease with heart failure: Secondary | ICD-10-CM | POA: Diagnosis not present

## 2018-03-02 DIAGNOSIS — E119 Type 2 diabetes mellitus without complications: Secondary | ICD-10-CM | POA: Diagnosis not present

## 2018-03-02 DIAGNOSIS — R296 Repeated falls: Secondary | ICD-10-CM | POA: Diagnosis not present

## 2018-03-02 DIAGNOSIS — J449 Chronic obstructive pulmonary disease, unspecified: Secondary | ICD-10-CM | POA: Diagnosis not present

## 2018-03-02 DIAGNOSIS — I5020 Unspecified systolic (congestive) heart failure: Secondary | ICD-10-CM | POA: Diagnosis not present

## 2018-03-03 ENCOUNTER — Other Ambulatory Visit: Payer: Self-pay

## 2018-03-03 ENCOUNTER — Telehealth: Payer: Self-pay

## 2018-03-03 MED ORDER — ALPRAZOLAM 0.25 MG PO TABS: ORAL_TABLET | ORAL | 4 refills | Status: DC

## 2018-03-03 NOTE — Telephone Encounter (Signed)
Patient rx printed awating Dr. Serena Croissant.

## 2018-03-03 NOTE — Telephone Encounter (Signed)
May have a refill 1 month supply with 4 refills

## 2018-03-03 NOTE — Telephone Encounter (Signed)
[name] states the pt needs refills on Xanax 0.25 po Bid sent to [facility] care at fax # [phone]. Please advise.

## 2018-03-03 NOTE — Telephone Encounter (Signed)
Miranda aware we have sent the medication into [facility].

## 2018-03-04 ENCOUNTER — Other Ambulatory Visit: Payer: Self-pay

## 2018-03-04 ENCOUNTER — Emergency Department (HOSPITAL_COMMUNITY): Payer: Medicare Other

## 2018-03-04 ENCOUNTER — Encounter (HOSPITAL_COMMUNITY): Payer: Self-pay | Admitting: Emergency Medicine

## 2018-03-04 ENCOUNTER — Emergency Department (HOSPITAL_COMMUNITY)
Admission: EM | Admit: 2018-03-04 | Discharge: 2018-03-04 | Disposition: A | Discharge status: Home / Self Care | Payer: Medicare Other | Attending: Emergency Medicine | Admitting: Emergency Medicine

## 2018-03-04 DIAGNOSIS — Y93E1 Activity, personal bathing and showering: Secondary | ICD-10-CM | POA: Diagnosis not present

## 2018-03-04 DIAGNOSIS — W182XXA Fall in (into) shower or empty bathtub, initial encounter: Secondary | ICD-10-CM | POA: Insufficient documentation

## 2018-03-04 DIAGNOSIS — I110 Hypertensive heart disease with heart failure: Secondary | ICD-10-CM | POA: Diagnosis not present

## 2018-03-04 DIAGNOSIS — Y929 Unspecified place or not applicable: Secondary | ICD-10-CM | POA: Insufficient documentation

## 2018-03-04 DIAGNOSIS — E785 Hyperlipidemia, unspecified: Secondary | ICD-10-CM | POA: Diagnosis not present

## 2018-03-04 DIAGNOSIS — E1140 Type 2 diabetes mellitus with diabetic neuropathy, unspecified: Secondary | ICD-10-CM | POA: Insufficient documentation

## 2018-03-04 DIAGNOSIS — J449 Chronic obstructive pulmonary disease, unspecified: Secondary | ICD-10-CM | POA: Diagnosis not present

## 2018-03-04 DIAGNOSIS — S0990XA Unspecified injury of head, initial encounter: Secondary | ICD-10-CM | POA: Diagnosis not present

## 2018-03-04 DIAGNOSIS — Z79899 Other long term (current) drug therapy: Secondary | ICD-10-CM | POA: Diagnosis not present

## 2018-03-04 DIAGNOSIS — Z87891 Personal history of nicotine dependence: Secondary | ICD-10-CM | POA: Diagnosis not present

## 2018-03-04 DIAGNOSIS — S098XXA Other specified injuries of head, initial encounter: Secondary | ICD-10-CM | POA: Diagnosis not present

## 2018-03-04 DIAGNOSIS — S01111A Laceration without foreign body of right eyelid and periocular area, initial encounter: Secondary | ICD-10-CM | POA: Diagnosis not present

## 2018-03-04 DIAGNOSIS — I509 Heart failure, unspecified: Secondary | ICD-10-CM | POA: Diagnosis not present

## 2018-03-04 DIAGNOSIS — R279 Unspecified lack of coordination: Secondary | ICD-10-CM | POA: Diagnosis not present

## 2018-03-04 DIAGNOSIS — Y998 Other external cause status: Secondary | ICD-10-CM | POA: Diagnosis not present

## 2018-03-04 DIAGNOSIS — S4991XA Unspecified injury of right shoulder and upper arm, initial encounter: Secondary | ICD-10-CM | POA: Diagnosis not present

## 2018-03-04 DIAGNOSIS — S0181XA Laceration without foreign body of other part of head, initial encounter: Secondary | ICD-10-CM

## 2018-03-04 DIAGNOSIS — S59911A Unspecified injury of right forearm, initial encounter: Secondary | ICD-10-CM | POA: Diagnosis not present

## 2018-03-04 DIAGNOSIS — Z7984 Long term (current) use of oral hypoglycemic drugs: Secondary | ICD-10-CM | POA: Diagnosis not present

## 2018-03-04 DIAGNOSIS — S0101XA Laceration without foreign body of scalp, initial encounter: Secondary | ICD-10-CM | POA: Diagnosis not present

## 2018-03-04 DIAGNOSIS — Z8673 Personal history of transient ischemic attack (TIA), and cerebral infarction without residual deficits: Secondary | ICD-10-CM | POA: Insufficient documentation

## 2018-03-04 DIAGNOSIS — W19XXXA Unspecified fall, initial encounter: Secondary | ICD-10-CM

## 2018-03-04 DIAGNOSIS — Z743 Need for continuous supervision: Secondary | ICD-10-CM | POA: Diagnosis not present

## 2018-03-04 DIAGNOSIS — S0993XA Unspecified injury of face, initial encounter: Secondary | ICD-10-CM | POA: Diagnosis not present

## 2018-03-04 MED ORDER — BACITRACIN-NEOMYCIN-POLYMYXIN 400-5-5000 EX OINT: 1.0000 "application " | TOPICAL_OINTMENT | Freq: Two times a day (BID) | CUTANEOUS | 0 refills | Status: AC

## 2018-03-04 MED ORDER — BACITRACIN ZINC 500 UNIT/GM EX OINT
TOPICAL_OINTMENT | CUTANEOUS | Status: AC
  Filled 2018-03-04: qty 2.7

## 2018-03-04 MED ORDER — LIDOCAINE-EPINEPHRINE-TETRACAINE (LET) SOLUTION
3.0000 mL | Freq: Once | NASAL | Status: AC
  Administered 2018-03-04: 3 mL via TOPICAL
  Filled 2018-03-04: qty 3

## 2018-03-04 NOTE — ED Triage Notes (Signed)
Pt slipped and fell in the shower at [facility].  C/o stinging to right arm from skin tear and laceration over right eye.

## 2018-03-04 NOTE — Discharge Instructions (Signed)
Please keep the skin tear wounds covered with antibiotic ointment on them with daily dressing changes for the next 4 days.  For the wound on his head please keep the bandage on it for the next 24 hours and after that use antibiotic ointment twice a day and leave it to air.

## 2018-03-04 NOTE — ED Provider Notes (Signed)
Emergency Department Provider Note   I have reviewed the triage vital signs and the nursing notes.   HISTORY  Chief Complaint Fall   HPI Christian Thomas is a 84 y.o. male with multiple medical problems as documented below the presents to the emergency department after a fall.  Patient is hard of hearing but has no history of memory issues or dementia that him or his niece know of.  Patient states that he was taking shower and was wet and his feet slipped out from underneath him and he fell on his right side hitting his head.  Right now is complaining of right forehead pain, right wrist pain and r left elbow pain.  He has a skin tear on his right arm that was bandaged prior to arrival.  States he did not pass out.  Did not have any chest pain or headache or palpitations prior to the fall. No other associated or modifying symptoms.    Past Medical History:  Diagnosis Date  . Benign essential tremor   . BPH (benign prostatic hypertrophy)   . Cerebellar stroke syndrome   . CHF (congestive heart failure) ([place])   . Chronic constipation   . Clostridium difficile colitis 12/2008  . DDD (degenerative disc disease), lumbar   . Diabetic neuropathy ([place])   . Diverticulosis   . Dysrhythmia   . Essential hypertension, benign   . Gout   . Macular degeneration   . Mixed hyperlipidemia   . Stroke ([place])   . Type 2 diabetes mellitus ([place])   . Venous insufficiency     Patient Active Problem List   Diagnosis Date Noted  . Hypoxia 08/10/2016  . COPD exacerbation ([place]) with hypoxia and wheezy bronchitis 08/10/2016  . Asthma with acute exacerbation 03/03/2016  . Degenerative arthritis of knee, bilateral 12/10/2014  . GI bleeding 12/14/2013  . Accelerated hypertension 12/14/2013  . Anemia due to blood loss, acute 12/14/2013  . Dizziness and giddiness 08/20/2013  . Cerebellar stroke syndrome 08/20/2013  . Hereditary and idiopathic peripheral neuropathy 06/15/2013  . Prostate hypertrophy  06/15/2013  . DM2 (diabetes mellitus, type 2) ([place]) 11/05/2012  . Femoral neck fracture ([place]) 11/05/2012  . Constipation 11/05/2012  . Syncope 07/07/2011  . Abnormal ECG 07/07/2011  . Essential hypertension, benign 07/07/2011    Past Surgical History:  Procedure Laterality Date  . APPENDECTOMY    . COLONOSCOPY     NL TI, pan-TICS, sml IH, CDIFF COLITIS  . COLONOSCOPY N/A 12/15/2013   Procedure: Colonoscopy and possible EGD;  Surgeon: Rogene Houston, MD;  Location: [facility];  Service: Endoscopy;  Laterality: N/A;  . HEMORRHOID SURGERY    . HERNIA REPAIR    . HIP ARTHROPLASTY  11/07/2012   Procedure: ARTHROPLASTY BIPOLAR HIP;  Surgeon: Mauri Pole, MD;  Location: [facility];  Service: Orthopedics;  Laterality: Right;  . SIGMOIDOSCOPY     Internal hemorrhoids    Current Outpatient Rx  . Order #: [id number] Class: Historical Med  . Order #: [id number] Class: Normal  . Order #: [id number] Class: Print  . Order #: [id number] Class: Historical Med  . Order #: [id number] Class: Normal  . Order #: [id number] Class: Historical Med  . Order #: [id number] Class: Historical Med  . Order #: [id number] Class: Normal  . Order #: [id number] Class: Normal  . Order #: [id number] Class: Normal  . Order #: [id number] Class: Normal  . Order #: [id number] Class: Normal  . Order #: [id number] Class: Normal  . Order #: [id number] Class: Normal  . Order #: [id number] Class: Normal  .  Order #: [id number] Class: Normal  . Order #: [id number] Class: Historical Med  . Order #: [id number] Class: Normal  . Order #: [id number] Class: Normal  . Order #: [id number] Class: Print  . Order #: [id number] Class: Normal  . Order #: [id number] Class: Normal  . Order #: [id number] Class: Historical Med  . Order #: [id number] Class: Historical Med  . Order #: [id number] Class: Historical Med  . Order #: [id number] Class: Historical Med  . Order #: [id number] Class: Normal  . Order #: [id number] Class: Historical Med  . Order #: [id number] Class: Normal  .  Order #: [id number] Class: Historical Med  . Order #: [id number] Class: Print  . Order #: [id number] Class: Historical Med  . Order #: [id number] Class: Normal  . Order #: [id number] Class: Normal  . Order #: [id number] Class: Historical Med    Allergies Morphine and related and Other  Family History  Problem Relation Age of Onset  . Cancer Mother        Breast    Social History Social History   Tobacco Use  . Smoking status: Former Smoker    Packs/day: 0.50    Years: 54.00    Pack years: 27.00    Types: Cigarettes    Last attempt to quit: 11/19/1992    Years since quitting: 25.3  . Smokeless tobacco: Never Used  Substance Use Topics  . Alcohol use: Yes    Comment: Rare  . Drug use: No    Review of Systems  All other systems negative except as documented in the HPI. All pertinent positives and negatives as reviewed in the HPI. ____________________________________________   PHYSICAL EXAM:  VITAL SIGNS: ED Triage Vitals  Enc Vitals Group     BP 03/04/18 1558 (!) 156/106     Pulse Rate 03/04/18 1558 (!) 109     Resp 03/04/18 1558 18     Temp 03/04/18 1558 98.6 F (37 C)     Temp Source 03/04/18 1558 Oral     SpO2 03/04/18 1558 97 %     Weight 03/04/18 1553 180 lb (81.6 kg)     Height 03/04/18 1553 6' (1.829 m)    Constitutional: Alert and oriented. Well appearing and in no acute distress. Eyes: Conjunctivae are normal. PERRL. EOMI. Head: stellate laceration above right eye, hemostatic. Nose: No congestion/rhinnorhea. Mouth/Throat: Mucous membranes are moist.  Oropharynx non-erythematous. Neck: No stridor.  No meningeal signs.   Cardiovascular: Normal rate, regular rhythm. Good peripheral circulation. Grossly normal heart sounds.   Respiratory: Normal respiratory effort.  No retractions. Lungs CTAB. Gastrointestinal: Soft and nontender. No distention.  Musculoskeletal: No lower extremity tenderness nor edema. No gross deformities of extremities. No cervical spine  tenderness, thoracic spine tenderness or Lumbar spine tenderness.  No tenderness or pain with palpation and full ROM of all joints in upper and lower extremities.  No ecchymosis or other signs of trauma on back or extremities.  No Pain with AP or lateral compression of ribs.  No Paracervical ttp, paraspinal ttp Neurologic:  Normal speech and language. No gross focal neurologic deficits are appreciated.  Skin:  Skin is warm, dry and intact. No rash noted. Skin tear on right wrist. Ecchymosis over right proximal humerus.   ____________________________________________   LABS (all labs ordered are listed, but only abnormal results are displayed)  Labs Reviewed - No data to display ____________________________________________  RADIOLOGY  Dg Forearm Right  Result Date: 03/04/2018 CLINICAL DATA:  [place] in shower at care facility, skin tear. EXAM: RIGHT HUMERUS - 2+ VIEW; RIGHT FOREARM - 2 VIEW COMPARISON:  Chest radiograph August 10, 2016 FINDINGS: RIGHT humerus: There is no evidence of fracture or other focal bone lesions. Soft tissues are nonsuspicious. Tiny LEFT lung base nodular densities. RIGHT forearm: There is no evidence of fracture or other focal bone lesions. Soft tissues are nonsuspicious. Mild vascular calcifications. IMPRESSION: 1. No acute osseous process. 2. Tiny LEFT lung base nodular densities, possible granulomas. Electronically Signed   By: Elon Alas M.D.   On: 03/04/2018 17:24   Ct Head Wo Contrast  Result Date: 03/04/2018 CLINICAL DATA:  84 y/o M; fall in shower and broke tail. Laceration over right arm. EXAM: CT HEAD WITHOUT CONTRAST TECHNIQUE: Contiguous axial images were obtained from the base of the skull through the vertex without intravenous contrast. COMPARISON:  07/04/2017 CT head FINDINGS: Brain: No evidence of acute infarction, hemorrhage, hydrocephalus, extra-axial collection or mass lesion/mass effect. Stable small chronic infarcts within bilateral  cerebellar hemispheres, parenchymal volume loss, and chronic microvascular ischemic changes of the brain. Vascular: Calcific atherosclerosis of carotid siphons and vertebral arteries. No hyperdense vessel identified. Skull: Laceration to the right supraorbital scalp soft tissues. No acute fracture identified. Sinuses/Orbits: No acute finding. Other: None. IMPRESSION: 1. Laceration to the right supraorbital scalp soft tissues. 2. No acute fracture or intracranial abnormality identified. 3. 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 Electronically Signed   By: Kristine Garbe M.D.   On: 03/04/2018 18:12   Dg Humerus Right  Result Date: 03/04/2018 CLINICAL DATA:  [place] in shower at care facility, skin tear. EXAM: RIGHT HUMERUS - 2+ VIEW; RIGHT FOREARM - 2 VIEW COMPARISON:  Chest radiograph August 10, 2016 FINDINGS: RIGHT humerus: There is no evidence of fracture or other focal bone lesions. Soft tissues are nonsuspicious. Tiny LEFT lung base nodular densities. RIGHT forearm: There is no evidence of fracture or other focal bone lesions. Soft tissues are nonsuspicious. Mild vascular calcifications. IMPRESSION: 1. No acute osseous process. 2. Tiny LEFT lung base nodular densities, possible granulomas. Electronically Signed   By: Elon Alas M.D.   On: 03/04/2018 17:24    ____________________________________________   PROCEDURES  Procedure(s) performed:   Marland KitchenMarland KitchenLaceration Repair Date/Time: 03/04/2018 9:04 PM Performed by: Merrily Pew, MD Authorized by: Merrily Pew, MD   Consent:    Consent obtained:  Verbal   Consent given by:  Patient   Risks discussed:  Infection, need for additional repair, poor cosmetic result and pain   Alternatives discussed:  No treatment Anesthesia (see MAR for exact dosages):    Anesthesia method:  Topical application   Topical anesthetic:   LET Laceration details:    Location:  Face   Face location:  R eyebrow   Wound length (cm): stellate, each side approx 1 cm. Repair type:    Repair type:  Simple Pre-procedure details:    Preparation:  Patient was prepped and draped in usual sterile fashion Exploration:    Hemostasis achieved with:  LET   Wound exploration: wound explored through full range of motion and entire depth of wound probed and visualized     Contaminated: no   Treatment:    Area cleansed with:  Betadine and saline   Amount of cleaning:  Standard   Irrigation solution:  Sterile saline   Irrigation volume:  50   Irrigation method:  Syringe Skin repair:    Repair method:  Sutures   Suture size:  5-0   Suture material:  Prolene   Suture technique:  Simple interrupted  Number of sutures:  5 Approximation:    Approximation:  Close Post-procedure details:    Dressing:  Antibiotic ointment and non-adherent dressing   Patient tolerance of procedure:  Tolerated well, no immediate complications    ____________________________________________   INITIAL IMPRESSION / ASSESSMENT AND PLAN / ED COURSE  Sound like a mechanical fall.  Patient will get head CT and x-rays of his arm.  Will repair laceration afterwards.  Skin tear to his right arm will reapproximate as well as possible and Steri-Strip.  Sutured as above. Will return 7-10 days for removal. Stable for dc to facility.   Pertinent labs & imaging results that were available during my care of the patient were reviewed by me and considered in my medical decision making (see chart for details).  ____________________________________________  FINAL CLINICAL IMPRESSION(S) / ED DIAGNOSES  Final diagnoses:  Fall, initial encounter  Laceration of eyebrow and forehead, right, initial encounter    MEDICATIONS GIVEN DURING THIS VISIT:  Medications  bacitracin 500 UNIT/GM ointment (has no administration in time range)  lidocaine-EPINEPHrine-tetracaine  (LET) solution (3 mLs Topical Given 03/04/18 1634)    NEW OUTPATIENT MEDICATIONS STARTED DURING THIS VISIT:  New Prescriptions   NEOMYCIN-BACITRACIN-POLYMYXIN (NEOSPORIN) OINTMENT    Apply 1 application topically every 12 (twelve) hours.    Note:  This note was prepared with assistance of Dragon voice recognition software. Occasional wrong-word or sound-a-like substitutions may have occurred due to the inherent limitations of voice recognition software.   Merrily Pew, MD 03/04/18 2107

## 2018-03-06 DIAGNOSIS — I5020 Unspecified systolic (congestive) heart failure: Secondary | ICD-10-CM | POA: Diagnosis not present

## 2018-03-06 DIAGNOSIS — I110 Hypertensive heart disease with heart failure: Secondary | ICD-10-CM | POA: Diagnosis not present

## 2018-03-06 DIAGNOSIS — J449 Chronic obstructive pulmonary disease, unspecified: Secondary | ICD-10-CM | POA: Diagnosis not present

## 2018-03-06 DIAGNOSIS — E119 Type 2 diabetes mellitus without complications: Secondary | ICD-10-CM | POA: Diagnosis not present

## 2018-03-06 DIAGNOSIS — M170 Bilateral primary osteoarthritis of knee: Secondary | ICD-10-CM | POA: Diagnosis not present

## 2018-03-06 DIAGNOSIS — R296 Repeated falls: Secondary | ICD-10-CM | POA: Diagnosis not present

## 2018-03-07 ENCOUNTER — Encounter (HOSPITAL_COMMUNITY): Payer: Self-pay | Admitting: Emergency Medicine

## 2018-03-07 ENCOUNTER — Emergency Department (HOSPITAL_COMMUNITY): Payer: Medicare Other

## 2018-03-07 ENCOUNTER — Telehealth: Payer: Self-pay | Admitting: Family Medicine

## 2018-03-07 ENCOUNTER — Inpatient Hospital Stay (HOSPITAL_COMMUNITY)
Admission: EM | Admit: 2018-03-07 | Discharge: 2018-03-10 | DRG: 193 | Disposition: A | Discharge status: Home Health | Payer: Medicare Other | Attending: Family Medicine | Admitting: Family Medicine

## 2018-03-07 ENCOUNTER — Other Ambulatory Visit: Payer: Self-pay

## 2018-03-07 DIAGNOSIS — I4891 Unspecified atrial fibrillation: Secondary | ICD-10-CM | POA: Diagnosis present

## 2018-03-07 DIAGNOSIS — Z7189 Other specified counseling: Secondary | ICD-10-CM

## 2018-03-07 DIAGNOSIS — J9601 Acute respiratory failure with hypoxia: Secondary | ICD-10-CM | POA: Diagnosis not present

## 2018-03-07 DIAGNOSIS — R4182 Altered mental status, unspecified: Secondary | ICD-10-CM | POA: Diagnosis not present

## 2018-03-07 DIAGNOSIS — R05 Cough: Secondary | ICD-10-CM | POA: Diagnosis not present

## 2018-03-07 DIAGNOSIS — Z8673 Personal history of transient ischemic attack (TIA), and cerebral infarction without residual deficits: Secondary | ICD-10-CM

## 2018-03-07 DIAGNOSIS — Z79899 Other long term (current) drug therapy: Secondary | ICD-10-CM

## 2018-03-07 DIAGNOSIS — N400 Enlarged prostate without lower urinary tract symptoms: Secondary | ICD-10-CM | POA: Diagnosis present

## 2018-03-07 DIAGNOSIS — Z7951 Long term (current) use of inhaled steroids: Secondary | ICD-10-CM

## 2018-03-07 DIAGNOSIS — K5909 Other constipation: Secondary | ICD-10-CM | POA: Diagnosis present

## 2018-03-07 DIAGNOSIS — R0902 Hypoxemia: Secondary | ICD-10-CM | POA: Diagnosis not present

## 2018-03-07 DIAGNOSIS — H3530 Unspecified macular degeneration: Secondary | ICD-10-CM | POA: Diagnosis present

## 2018-03-07 DIAGNOSIS — G9340 Encephalopathy, unspecified: Secondary | ICD-10-CM | POA: Diagnosis present

## 2018-03-07 DIAGNOSIS — I10 Essential (primary) hypertension: Secondary | ICD-10-CM | POA: Diagnosis present

## 2018-03-07 DIAGNOSIS — J189 Pneumonia, unspecified organism: Secondary | ICD-10-CM | POA: Diagnosis not present

## 2018-03-07 DIAGNOSIS — Z87898 Personal history of other specified conditions: Secondary | ICD-10-CM

## 2018-03-07 DIAGNOSIS — E782 Mixed hyperlipidemia: Secondary | ICD-10-CM | POA: Diagnosis present

## 2018-03-07 DIAGNOSIS — E860 Dehydration: Secondary | ICD-10-CM | POA: Diagnosis present

## 2018-03-07 DIAGNOSIS — R0602 Shortness of breath: Secondary | ICD-10-CM | POA: Diagnosis not present

## 2018-03-07 DIAGNOSIS — I872 Venous insufficiency (chronic) (peripheral): Secondary | ICD-10-CM | POA: Diagnosis present

## 2018-03-07 DIAGNOSIS — G9341 Metabolic encephalopathy: Secondary | ICD-10-CM

## 2018-03-07 DIAGNOSIS — N179 Acute kidney failure, unspecified: Secondary | ICD-10-CM | POA: Diagnosis not present

## 2018-03-07 DIAGNOSIS — Z515 Encounter for palliative care: Secondary | ICD-10-CM

## 2018-03-07 DIAGNOSIS — Z6822 Body mass index (BMI) 22.0-22.9, adult: Secondary | ICD-10-CM

## 2018-03-07 DIAGNOSIS — E1140 Type 2 diabetes mellitus with diabetic neuropathy, unspecified: Secondary | ICD-10-CM | POA: Diagnosis present

## 2018-03-07 DIAGNOSIS — R269 Unspecified abnormalities of gait and mobility: Secondary | ICD-10-CM | POA: Diagnosis present

## 2018-03-07 DIAGNOSIS — E876 Hypokalemia: Secondary | ICD-10-CM | POA: Diagnosis present

## 2018-03-07 DIAGNOSIS — Z7984 Long term (current) use of oral hypoglycemic drugs: Secondary | ICD-10-CM

## 2018-03-07 DIAGNOSIS — Z87891 Personal history of nicotine dependence: Secondary | ICD-10-CM

## 2018-03-07 DIAGNOSIS — R296 Repeated falls: Secondary | ICD-10-CM | POA: Diagnosis present

## 2018-03-07 DIAGNOSIS — H9190 Unspecified hearing loss, unspecified ear: Secondary | ICD-10-CM | POA: Diagnosis present

## 2018-03-07 DIAGNOSIS — Z66 Do not resuscitate: Secondary | ICD-10-CM | POA: Diagnosis not present

## 2018-03-07 DIAGNOSIS — E43 Unspecified severe protein-calorie malnutrition: Secondary | ICD-10-CM

## 2018-03-07 DIAGNOSIS — R404 Transient alteration of awareness: Secondary | ICD-10-CM | POA: Diagnosis not present

## 2018-03-07 DIAGNOSIS — G250 Essential tremor: Secondary | ICD-10-CM | POA: Diagnosis present

## 2018-03-07 DIAGNOSIS — Z885 Allergy status to narcotic agent status: Secondary | ICD-10-CM

## 2018-03-07 DIAGNOSIS — R030 Elevated blood-pressure reading, without diagnosis of hypertension: Secondary | ICD-10-CM | POA: Diagnosis not present

## 2018-03-07 DIAGNOSIS — R748 Abnormal levels of other serum enzymes: Secondary | ICD-10-CM | POA: Diagnosis present

## 2018-03-07 DIAGNOSIS — M109 Gout, unspecified: Secondary | ICD-10-CM | POA: Diagnosis present

## 2018-03-07 LAB — BRAIN NATRIURETIC PEPTIDE: B Natriuretic Peptide: 125 pg/mL — ABNORMAL HIGH (ref 0.0–100.0)

## 2018-03-07 LAB — CBC WITH DIFFERENTIAL/PLATELET
BASOS ABS: 0 10*3/uL (ref 0.0–0.1)
BASOS PCT: 0 %
EOS PCT: 0 %
Eosinophils Absolute: 0 10*3/uL (ref 0.0–0.7)
HCT: 37 % — ABNORMAL LOW (ref 39.0–52.0)
Hemoglobin: 12.1 g/dL — ABNORMAL LOW (ref 13.0–17.0)
Lymphocytes Relative: 13 %
Lymphs Abs: 1.4 10*3/uL (ref 0.7–4.0)
MCH: 30.8 pg (ref 26.0–34.0)
MCHC: 32.7 g/dL (ref 30.0–36.0)
MCV: 94.1 fL (ref 78.0–100.0)
MONO ABS: 0.8 10*3/uL (ref 0.1–1.0)
Monocytes Relative: 7 %
NEUTROS ABS: 9.1 10*3/uL — AB (ref 1.7–7.7)
Neutrophils Relative %: 80 %
PLATELETS: 270 10*3/uL (ref 150–400)
RBC: 3.93 MIL/uL — ABNORMAL LOW (ref 4.22–5.81)
RDW: 14.6 % (ref 11.5–15.5)
WBC: 11.3 10*3/uL — AB (ref 4.0–10.5)

## 2018-03-07 LAB — URINALYSIS, ROUTINE W REFLEX MICROSCOPIC
Bilirubin Urine: NEGATIVE
Glucose, UA: NEGATIVE mg/dL
HGB URINE DIPSTICK: NEGATIVE
Ketones, ur: NEGATIVE mg/dL
LEUKOCYTES UA: NEGATIVE
NITRITE: NEGATIVE
PROTEIN: NEGATIVE mg/dL
Specific Gravity, Urine: 1.02 (ref 1.005–1.030)
pH: 5 (ref 5.0–8.0)

## 2018-03-07 LAB — COMPREHENSIVE METABOLIC PANEL
ALBUMIN: 3.4 g/dL — AB (ref 3.5–5.0)
ALT: 16 U/L — ABNORMAL LOW (ref 17–63)
AST: 25 U/L (ref 15–41)
Alkaline Phosphatase: 57 U/L (ref 38–126)
Anion gap: 13 (ref 5–15)
BUN: 57 mg/dL — ABNORMAL HIGH (ref 6–20)
CHLORIDE: 97 mmol/L — AB (ref 101–111)
CO2: 24 mmol/L (ref 22–32)
Calcium: 8.6 mg/dL — ABNORMAL LOW (ref 8.9–10.3)
Creatinine, Ser: 2.03 mg/dL — ABNORMAL HIGH (ref 0.61–1.24)
GFR calc Af Amer: 29 mL/min — ABNORMAL LOW (ref >60)
GFR calc non Af Amer: 25 mL/min — ABNORMAL LOW (ref >60)
GLUCOSE: 161 mg/dL — AB (ref 65–99)
POTASSIUM: 3.9 mmol/L (ref 3.5–5.1)
Sodium: 134 mmol/L — ABNORMAL LOW (ref 135–145)
Total Bilirubin: 0.5 mg/dL (ref 0.3–1.2)
Total Protein: 6.3 g/dL — ABNORMAL LOW (ref 6.5–8.1)

## 2018-03-07 LAB — BLOOD GAS, ARTERIAL
ACID-BASE EXCESS: 1.2 mmol/L (ref 0.0–2.0)
Bicarbonate: 25.1 mmol/L (ref 20.0–28.0)
DRAWN BY: 28459
O2 Saturation: 77.6 %
PATIENT TEMPERATURE: 37
pCO2 arterial: 40.7 mmHg (ref 32.0–48.0)
pH, Arterial: 7.411 (ref 7.350–7.450)
pO2, Arterial: 43.7 mmHg — ABNORMAL LOW (ref 83.0–108.0)

## 2018-03-07 LAB — POC OCCULT BLOOD, ED: FECAL OCCULT BLD: NEGATIVE

## 2018-03-07 LAB — TROPONIN I: Troponin I: 0.2 ng/mL (ref <0.03)

## 2018-03-07 LAB — CBG MONITORING, ED: Glucose-Capillary: 176 mg/dL — ABNORMAL HIGH (ref 65–99)

## 2018-03-07 LAB — PROCALCITONIN: PROCALCITONIN: 6.57 ng/mL

## 2018-03-07 MED ORDER — AZITHROMYCIN 500 MG IV SOLR
250.0000 mg | INTRAVENOUS | Status: DC
  Administered 2018-03-08 – 2018-03-09 (×2): 250 mg via INTRAVENOUS
  Filled 2018-03-07 (×5): qty 250

## 2018-03-07 MED ORDER — ACETAMINOPHEN 325 MG PO TABS
650.0000 mg | ORAL_TABLET | Freq: Four times a day (QID) | ORAL | Status: DC | PRN
  Administered 2018-03-10: 650 mg via ORAL
  Filled 2018-03-07: qty 2

## 2018-03-07 MED ORDER — SODIUM CHLORIDE 0.9 % IV SOLN
1.0000 g | Freq: Once | INTRAVENOUS | Status: AC
  Administered 2018-03-07: 1 g via INTRAVENOUS
  Filled 2018-03-07: qty 10

## 2018-03-07 MED ORDER — FLUTICASONE PROPIONATE 50 MCG/ACT NA SUSP
2.0000 | Freq: Every day | NASAL | Status: DC
  Administered 2018-03-08 – 2018-03-10 (×3): 2 via NASAL
  Filled 2018-03-07 (×2): qty 16

## 2018-03-07 MED ORDER — ONDANSETRON HCL 4 MG PO TABS: 4.0000 mg | ORAL_TABLET | Freq: Four times a day (QID) | ORAL | Status: DC | PRN

## 2018-03-07 MED ORDER — LACTATED RINGERS IV SOLN
INTRAVENOUS | Status: AC
  Administered 2018-03-08: via INTRAVENOUS

## 2018-03-07 MED ORDER — HEPARIN SODIUM (PORCINE) 5000 UNIT/ML IJ SOLN
5000.0000 [IU] | Freq: Three times a day (TID) | INTRAMUSCULAR | Status: DC
  Administered 2018-03-08 – 2018-03-10 (×9): 5000 [IU] via SUBCUTANEOUS
  Filled 2018-03-07 (×9): qty 1

## 2018-03-07 MED ORDER — ACETAMINOPHEN 650 MG RE SUPP
650.0000 mg | Freq: Four times a day (QID) | RECTAL | Status: DC | PRN
  Administered 2018-03-08 – 2018-03-09 (×3): 650 mg via RECTAL
  Filled 2018-03-07 (×3): qty 1

## 2018-03-07 MED ORDER — AMLODIPINE BESYLATE 5 MG PO TABS
5.0000 mg | ORAL_TABLET | Freq: Every day | ORAL | Status: DC
  Filled 2018-03-07: qty 1

## 2018-03-07 MED ORDER — SODIUM CHLORIDE 0.9 % IV SOLN
500.0000 mg | Freq: Once | INTRAVENOUS | Status: AC
  Administered 2018-03-07: 500 mg via INTRAVENOUS
  Filled 2018-03-07: qty 500

## 2018-03-07 MED ORDER — INSULIN ASPART 100 UNIT/ML ~~LOC~~ SOLN: 0.0000 [IU] | Freq: Every day | SUBCUTANEOUS | Status: DC

## 2018-03-07 MED ORDER — CALCIUM CARBONATE ANTACID 500 MG PO CHEW: 2.0000 | CHEWABLE_TABLET | Freq: Four times a day (QID) | ORAL | Status: DC | PRN

## 2018-03-07 MED ORDER — INSULIN ASPART 100 UNIT/ML ~~LOC~~ SOLN
0.0000 [IU] | Freq: Three times a day (TID) | SUBCUTANEOUS | Status: DC
  Administered 2018-03-08: 1 [IU] via SUBCUTANEOUS

## 2018-03-07 MED ORDER — DM-GUAIFENESIN ER 30-600 MG PO TB12
1.0000 | ORAL_TABLET | Freq: Two times a day (BID) | ORAL | Status: DC
  Administered 2018-03-08: 1 via ORAL
  Filled 2018-03-07 (×2): qty 1

## 2018-03-07 MED ORDER — ONDANSETRON HCL 4 MG/2ML IJ SOLN
4.0000 mg | Freq: Four times a day (QID) | INTRAMUSCULAR | Status: DC | PRN
Start: 2018-03-07 — End: 2018-03-10

## 2018-03-07 MED ORDER — IPRATROPIUM-ALBUTEROL 0.5-2.5 (3) MG/3ML IN SOLN: 3.0000 mL | Freq: Four times a day (QID) | RESPIRATORY_TRACT | Status: DC | PRN

## 2018-03-07 MED ORDER — ALLOPURINOL 100 MG PO TABS
100.0000 mg | ORAL_TABLET | Freq: Every day | ORAL | Status: DC
  Administered 2018-03-10: 100 mg via ORAL
  Filled 2018-03-07 (×2): qty 1

## 2018-03-07 MED ORDER — FINASTERIDE 5 MG PO TABS
5.0000 mg | ORAL_TABLET | Freq: Every day | ORAL | Status: DC
  Administered 2018-03-10: 5 mg via ORAL
  Filled 2018-03-07 (×6): qty 1

## 2018-03-07 NOTE — H&P (Addendum)
History and Physical    Christian Thomas DZH:[id number] DOB: 1915-03-15 DOA: 03/07/2018  PCP: Mikey Kirschner, MD   Patient coming from: Assisted living facility  Chief Complaint: Altered mental status  HPI: Christian Thomas is a 84 y.o. male with medical history significant for progressive weakness, hypertension, dyslipidemia, BPH, and type 2 diabetes who presents from his assisted living facility after he was noted to be unarousable despite sternal rub.  He apparently fell asleep earlier this evening at approximately 6 PM.  He has been dealing with progressive generalized weakness with recurrent falls.  Patient is otherwise a difficult historian and very hard of hearing. He states that he has had some chills but no fevers and does have a cough with yellowish sputum noted. He has poor appetite and denies any nausea, vomiting or diarrhea.   ED Course: Vital signs noted to be stable.  Laboratory data remarkable for leukocytosis of 11,300, troponin 0 0.20, hemoglobin 12.1, creatinine 2.03 with baseline near 1, BUN 57, glucose 161, and platelets of 270.  Two-view chest x-ray demonstrates findings with possible left-sided pneumonia.  He is currently on 2 L nasal cannula with oxygen desaturation in the mid 80th percentile on room air.  He does not wear oxygen at home.  Review of Systems: Difficult to obtain due to patient condition.  Past Medical History:  Diagnosis Date  . Benign essential tremor   . BPH (benign prostatic hypertrophy)   . Cerebellar stroke syndrome   . CHF (congestive heart failure) ([place])   . Chronic constipation   . Clostridium difficile colitis 12/2008  . DDD (degenerative disc disease), lumbar   . Diabetic neuropathy ([place])   . Diverticulosis   . Dysrhythmia   . Essential hypertension, benign   . Gout   . Macular degeneration   . Mixed hyperlipidemia   . Stroke ([place])   . Type 2 diabetes mellitus ([place])   . Venous insufficiency     Past Surgical History:    Procedure Laterality Date  . APPENDECTOMY    . COLONOSCOPY     NL TI, pan-TICS, sml IH, CDIFF COLITIS  . COLONOSCOPY N/A 12/15/2013   Procedure: Colonoscopy and possible EGD;  Surgeon: Rogene Houston, MD;  Location: [facility];  Service: Endoscopy;  Laterality: N/A;  . HEMORRHOID SURGERY    . HERNIA REPAIR    . HIP ARTHROPLASTY  11/07/2012   Procedure: ARTHROPLASTY BIPOLAR HIP;  Surgeon: Mauri Pole, MD;  Location: [facility];  Service: Orthopedics;  Laterality: Right;  . SIGMOIDOSCOPY     Internal hemorrhoids     reports that he quit smoking about 25 years ago. His smoking use included cigarettes. He has a 27.00 pack-year smoking history. He has never used smokeless tobacco. He reports that he drinks alcohol. He reports that he does not use drugs.  Allergies  Allergen Reactions  . Morphine And Related   . Opium     Family History  Problem Relation Age of Onset  . Cancer Mother        Breast    Prior to Admission medications   Medication Sig Start Date End Date Taking? Authorizing Provider  acetaminophen (TYLENOL) 500 MG tablet Take 1,000 mg by mouth 2 (two) times daily. 2 bid    Yes [provider]  albuterol (PROVENTIL HFA;VENTOLIN HFA) 108 (90 Base) MCG/ACT inhaler Inhale 2 puffs into the lungs 4 (four) times daily as needed for wheezing or shortness of breath.   Yes [provider]  albuterol (PROVENTIL) (2.5 MG/3ML) 0.083% nebulizer solution Take 3 mLs (2.5 mg total) by nebulization every 4 (four) hours as needed for wheezing or shortness of breath. 04/04/17  Yes Mikey Kirschner, MD  allopurinol (ZYLOPRIM) 300 MG tablet TAKE 1 TAB BY MOUTH EVERY DAY -- DX: GOUT 10/28/17  Yes Mikey Kirschner, MD  ALPRAZolam Duanne Moron) 0.25 MG tablet Take 1 tablet BID Patient taking differently: Take 0.25 mg by mouth 2 (two) times daily. Take 1 tablet BID 03/03/18  Yes Luking, Elayne Snare, MD  alum & mag hydroxide-simeth ([place]) 200-200-20 MG/5ML suspension Take 30 mLs by  mouth every evening.   Yes [provider]  amLODipine (NORVASC) 5 MG tablet Take 5 mg by mouth daily.  02/12/18  Yes [provider]  calcium carbonate (TUMS - DOSED IN MG ELEMENTAL CALCIUM) 500 MG chewable tablet Chew 2 tablets by mouth every 6 (six) hours as needed for indigestion or heartburn.   Yes [provider]  capsaicin (ZOSTRIX) 0.025 % cream Apply 1 application topically 2 (two) times daily as needed (apply to left ankle and feet as needed).   Yes [provider]  dextrose (GLUTOSE) 40 % GEL Take 1 Tube by mouth once as needed for low blood sugar (for levels <60).   Yes [provider]  diphenhydrAMINE (BENADRYL) 25 MG tablet Take 25 mg by mouth every 6 (six) hours as needed for itching.   Yes [provider]  enalapril (VASOTEC) 10 MG tablet TAKE (1) TABLET BY MOUTH TWICE DAILY. 06/07/16  Yes Mikey Kirschner, MD  finasteride (PROSCAR) 5 MG tablet Take 5 mg by mouth daily.  02/07/18  Yes [provider]  fluticasone (FLONASE) 50 MCG/ACT nasal spray Place 2 sprays into both nostrils daily.  02/12/18  Yes [provider]  GENERLAC 10 GM/15ML SOLN TAKE 30 ML BY MOUTH EVERY DAY 10/07/17  Yes Mikey Kirschner, MD  glipiZIDE (GLUCOTROL XL) 2.5 MG 24 hr tablet Take 1 tablet (2.5 mg total) by mouth daily with breakfast. 02/17/18  Yes Luking, Elayne Snare, MD  hydrOXYzine (ATARAX/VISTARIL) 25 MG tablet TAKE 1 TAB BY MOUTH AT BEDTIME FOR ITCHING Patient taking differently: Take 25 mg by mouth at bedtime.  02/07/18  Yes Mikey Kirschner, MD  loratadine (CLARITIN) 10 MG tablet TAKE 1 TABLET BY MOUTH ONCE DAILY FOR ALLERGIC RHINITIS. 09/02/14  Yes Luking, Elayne Snare, MD  lovastatin (MEVACOR) 40 MG tablet TAKE 1 TAB BY MOUTH AT BEDTIME -- DX: CHOLESTEROL Patient taking differently: Take 40 mg by mouth at bedtime.  02/17/18  Yes Kathyrn Drown, MD  magic mouthwash SOLN Take 15 mLs by mouth 4 (four) times daily as needed for mouth pain.   Yes  [provider]  meclizine (ANTIVERT) 25 MG tablet TAKE 1 TAB BY MOUTH EVERY 6 HOURS AS NEEDED FOR DIZZINESS 02/13/18  Yes Luking, Elayne Snare, MD  Menthol, Topical Analgesic, (BIOFREEZE EX) Apply 1 application topically 4 (four) times daily as needed (for pain).   Yes [provider]  metFORMIN (GLUCOPHAGE) 500 MG tablet TAKE 1 TAB BY MOUTH TWICE DAILY Patient taking differently: Take 500 mg by mouth 2 (two) times daily with a meal.  02/17/18  Yes Luking, Elayne Snare, MD  neomycin-bacitracin-polymyxin (NEOSPORIN) ointment Apply 1 application topically every 12 (twelve) hours. 03/04/18  Yes Mesner, Corene Cornea, MD  nortriptyline (PAMELOR) 10 MG capsule TAKE 1 CAPSULE (10 MG TOTAL) BY MOUTH AT BEDTIME. FOR NEUROPATHIC PAIN 12/28/17  Yes Mikey Kirschner,  MD  Olopatadine HCl 0.2 % SOLN One drop each eye daily Patient taking differently: Apply 1 drop to eye daily. One drop each eye daily 03/16/17  Yes Mikey Kirschner, MD  omeprazole (PRILOSEC) 20 MG capsule Take 1 capsule (20 mg total) by mouth 2 (two) times daily before a meal. 06/07/16  Yes Mikey Kirschner, MD  polyethylene glycol powder (GLYCOLAX/MIRALAX) powder Take 17 g by mouth 2 (two) times daily.    Yes [provider]  QC ANTACID/ANTI-GAS [phone] MG/5ML suspension TAKE 2 TABLESPOONFULS (17ml) BY MOUTH EACH EVENING. 02/10/15  Yes Mikey Kirschner, MD  senna (SENOKOT) 8.6 MG TABS tablet TAKE 2 TABS (17.2MG ) BY MOUTH AT BEDTIME 11/21/17  Yes Mikey Kirschner, MD  SORE THROAT LIQD USE 5 SPRAYS IN THROAT EVERY 2 HOURS AS NEEDED FOR SORE THROAT. 01/08/15  Yes Mikey Kirschner, MD  traMADol (ULTRAM) 50 MG tablet Take 1 tablet (50 mg total) by mouth 3 (three) times daily. 01/06/18  Yes Mikey Kirschner, MD  traZODone (DESYREL) 100 MG tablet Take 1 tablet (100 mg total) by mouth at bedtime. qhs Patient taking differently: Take 100 mg by mouth at bedtime.  12/01/17  Yes Mikey Kirschner, MD  Zinc Oxide (DESITIN) 13 % CREA Apply 1 application  topically daily as needed (rash).   Yes [provider]  Spacer/Aero-Holding Chambers (AEROCHAMBER MINI CHAMBER) DEVI Use as directed 09/01/15   Mikey Kirschner, MD    Physical Exam: Vitals:   03/07/18 2030 03/07/18 2100 03/07/18 2130 03/07/18 2138  BP: 127/62 (!) 125/59 130/65   Pulse: 82 85 85   Resp: (!) 26 (!) 25 (!) 24   Temp:      TempSrc:      SpO2: 96% (!) 87% (!) 86% 91%  Weight:      Height:        Constitutional: NAD, calm, comfortable Vitals:   03/07/18 2030 03/07/18 2100 03/07/18 2130 03/07/18 2138  BP: 127/62 (!) 125/59 130/65   Pulse: 82 85 85   Resp: (!) 26 (!) 25 (!) 24   Temp:      TempSrc:      SpO2: 96% (!) 87% (!) 86% 91%  Weight:      Height:       Eyes: lids and conjunctivae normal ENMT: Mucous membranes are moist.  Neck: normal, supple Respiratory: clear to auscultation bilaterally. Normal respiratory effort. No accessory muscle use.  Cardiovascular: Regular rate and rhythm, no murmurs. No extremity edema. Abdomen: no tenderness, no distention. Bowel sounds positive.  Musculoskeletal:  No joint deformity upper and lower extremities.   Skin: no rashes, lesions, ulcers.  Psychiatric: Normal judgment and insight. Alert and oriented x 3. Normal mood.   Labs on Admission: I have personally reviewed following labs and imaging studies  CBC: Recent Labs  Lab 03/07/18 2020  WBC 11.3*  NEUTROABS 9.1*  HGB 12.1*  HCT 37.0*  MCV 94.1  PLT 202   Basic Metabolic Panel: Recent Labs  Lab 03/07/18 2020  NA 134*  K 3.9  CL 97*  CO2 24  GLUCOSE 161*  BUN 57*  CREATININE 2.03*  CALCIUM 8.6*   GFR: Estimated Creatinine Clearance: 21.7 mL/min (A) (by C-G formula based on SCr of 2.03 mg/dL (H)). Liver Function Tests: Recent Labs  Lab 03/07/18 2020  AST 25  ALT 16*  ALKPHOS 57  BILITOT 0.5  PROT 6.3*  ALBUMIN 3.4*   No results for input(s): LIPASE, AMYLASE in the last  168 hours. No results for input(s): AMMONIA in the last 168  hours. Coagulation Profile: No results for input(s): INR, PROTIME in the last 168 hours. Cardiac Enzymes: Recent Labs  Lab 03/07/18 2020  TROPONINI 0.20*   BNP (last 3 results) No results for input(s): PROBNP in the last 8760 hours. HbA1C: No results for input(s): HGBA1C in the last 72 hours. CBG: Recent Labs  Lab 03/07/18 2117  GLUCAP 176*   Lipid Profile: No results for input(s): CHOL, HDL, LDLCALC, TRIG, CHOLHDL, LDLDIRECT in the last 72 hours. Thyroid Function Tests: No results for input(s): TSH, T4TOTAL, FREET4, T3FREE, THYROIDAB in the last 72 hours. Anemia Panel: No results for input(s): VITAMINB12, FOLATE, FERRITIN, TIBC, IRON, RETICCTPCT in the last 72 hours. Urine analysis:    Component Value Date/Time   COLORURINE YELLOW 03/07/2018 1944   APPEARANCEUR HAZY (A) 03/07/2018 1944   LABSPEC 1.020 03/07/2018 1944   PHURINE 5.0 03/07/2018 1944   GLUCOSEU NEGATIVE 03/07/2018 1944   HGBUR NEGATIVE 03/07/2018 1944   BILIRUBINUR NEGATIVE 03/07/2018 1944   KETONESUR NEGATIVE 03/07/2018 1944   PROTEINUR NEGATIVE 03/07/2018 1944   UROBILINOGEN 0.2 01/07/2014 0956   NITRITE NEGATIVE 03/07/2018 1944   LEUKOCYTESUR NEGATIVE 03/07/2018 1944    Radiological Exams on Admission: Dg Chest 2 View  Result Date: 03/07/2018 CLINICAL DATA:  84 y/o  M; cough and shortness of breath. EXAM: CHEST - 2 VIEW COMPARISON:  08/10/2016 chest radiograph FINDINGS: Stable cardiomegaly given projection technique. Calcific aortic atherosclerosis. Elevated left hemidiaphragm and left basilar opacity. Trace right pleural effusion. No acute osseous abnormality. IMPRESSION: Elevated left hemidiaphragm and left basilar opacity which may represent associated atelectasis or pneumonia. Electronically Signed   By: Kristine Garbe M.D.   On: 03/07/2018 20:27    EKG: Independently reviewed.  Sinus rhythm at 86 bpm with left anterior fascicular block.  Assessment/Plan Principal Problem:   Acute  metabolic encephalopathy Active Problems:   Pneumonia   AKI (acute kidney injury) ([place])   History of progressive weakness   Acute hypoxemic respiratory failure ([place])    1. Acute metabolic encephalopathy-multifactorial.  I believe that much of this is related to pneumonia as well as AK I.  Continue treatment for these as outlined below with neurochecks.  Will consult palliative care due to advanced age and multifactorial medical issues.  Patient is DNR/DNI. 2. Pneumonia.  Cultures are currently being obtained prior to initiation of Rocephin and azithromycin which I currently agree with.  Check sputum cultures if he can produce productive sputum.  Mucinex twice daily.  Duo nebs as needed for shortness of breath or wheezing.  Pro-calcitonin per lower respiratory protocol. 3. Acute hypoxemic respiratory failure likely secondary to above.  Continue treatment for pneumonia with incentive spirometry and up to chair.  Wean oxygen as tolerated. 4. AK I.  Avoid nephrotoxic agents and recheck a.m. renal panel.  Continue on gentle IV fluid as patient appears slightly dry. 5. Diabetes.  Sliding scale insulin and hold oral medications. 6. Hypertension.  Continue amlodipine and hold ACE inhibitor on account of AK I. 7. Progressive weakness with ambulatory dysfunction.  Fall precautions.  PT evaluation. 8. Elevated troponin.  Likely secondary to AK I.  No significant findings noted on EKG.  I will not cycle troponins at this time-patient denies chest pain.   DVT prophylaxis: Heparin Code Status: DNR/DNI Family Communication: None currently at bedside Disposition Plan: Treatment of pneumonia and AK I for resolution of encephalopathy Consults called: None Admission status: Observation, telemetry   Yarithza Mink  Darleen Crocker DO [facility] Pager [phone]  If 7PM-7AM, please contact night-coverage [email or web address] Password [facility]  03/07/2018, 10:32 PM

## 2018-03-07 NOTE — ED Provider Notes (Signed)
[facility] EMERGENCY DEPARTMENT Provider Note   CSN: [id number] Arrival date & time: 03/07/18  1842     History   Chief Complaint Chief Complaint  Patient presents with  . Altered Mental Status   5 caveat altered mental status HPI Christian Thomas is a 84 y.o. male.  History is obtained from patient's niece and nephew who accompany him as well as from [facility] facility where patient lives.   HPI Patient with generalized weakness since having fall for which she was evaluated here 03/04/2018.  He fell asleep tonight at 6 PM.  He was unarousable to sternal rub blood pressure was noted to be 100/20.  No other vital signs were taken.  Patient denies pain anywhere. Past Medical History:  Diagnosis Date  . Benign essential tremor   . BPH (benign prostatic hypertrophy)   . Cerebellar stroke syndrome   . CHF (congestive heart failure) ([place])   . Chronic constipation   . Clostridium difficile colitis 12/2008  . DDD (degenerative disc disease), lumbar   . Diabetic neuropathy ([place])   . Diverticulosis   . Dysrhythmia   . Essential hypertension, benign   . Gout   . Macular degeneration   . Mixed hyperlipidemia   . Stroke ([place])   . Type 2 diabetes mellitus ([place])   . Venous insufficiency     Patient Active Problem List   Diagnosis Date Noted  . Hypoxia 08/10/2016  . COPD exacerbation ([place]) with hypoxia and wheezy bronchitis 08/10/2016  . Asthma with acute exacerbation 03/03/2016  . Degenerative arthritis of knee, bilateral 12/10/2014  . GI bleeding 12/14/2013  . Accelerated hypertension 12/14/2013  . Anemia due to blood loss, acute 12/14/2013  . Dizziness and giddiness 08/20/2013  . Cerebellar stroke syndrome 08/20/2013  . Hereditary and idiopathic peripheral neuropathy 06/15/2013  . Prostate hypertrophy 06/15/2013  . DM2 (diabetes mellitus, type 2) ([place]) 11/05/2012  . Femoral neck fracture ([place]) 11/05/2012  . Constipation 11/05/2012  . Syncope 07/07/2011  .  Abnormal ECG 07/07/2011  . Essential hypertension, benign 07/07/2011    Past Surgical History:  Procedure Laterality Date  . APPENDECTOMY    . COLONOSCOPY     NL TI, pan-TICS, sml IH, CDIFF COLITIS  . COLONOSCOPY N/A 12/15/2013   Procedure: Colonoscopy and possible EGD;  Surgeon: Rogene Houston, MD;  Location: [facility] ENDO SUITE;  Service: Endoscopy;  Laterality: N/A;  . HEMORRHOID SURGERY    . HERNIA REPAIR    . HIP ARTHROPLASTY  11/07/2012   Procedure: ARTHROPLASTY BIPOLAR HIP;  Surgeon: Mauri Pole, MD;  Location: [facility] ORS;  Service: Orthopedics;  Laterality: Right;  . SIGMOIDOSCOPY     Internal hemorrhoids        Home Medications    Prior to Admission medications   Medication Sig Start Date End Date Taking? Authorizing Provider  acetaminophen (TYLENOL) 500 MG tablet Take 1,000 mg by mouth 2 (two) times daily. 2 bid    Yes [provider]  albuterol (PROVENTIL HFA;VENTOLIN HFA) 108 (90 Base) MCG/ACT inhaler Inhale 2 puffs into the lungs 4 (four) times daily as needed for wheezing or shortness of breath.   Yes [provider]  albuterol (PROVENTIL) (2.5 MG/3ML) 0.083% nebulizer solution Take 3 mLs (2.5 mg total) by nebulization every 4 (four) hours as needed for wheezing or shortness of breath. 04/04/17  Yes Mikey Kirschner, MD  allopurinol (ZYLOPRIM) 300 MG tablet TAKE 1 TAB BY MOUTH EVERY DAY -- DX: GOUT 10/28/17  Yes Luking,  Grace Bushy, MD  ALPRAZolam Duanne Moron) 0.25 MG tablet Take 1 tablet BID Patient taking differently: Take 0.25 mg by mouth 2 (two) times daily. Take 1 tablet BID 03/03/18  Yes Luking, Elayne Snare, MD  alum & mag hydroxide-simeth ([place]) 200-200-20 MG/5ML suspension Take 30 mLs by mouth every evening.   Yes [provider]  amLODipine (NORVASC) 5 MG tablet Take 5 mg by mouth daily.  02/12/18  Yes [provider]  calcium carbonate (TUMS - DOSED IN MG ELEMENTAL CALCIUM) 500 MG chewable tablet Chew 2 tablets by mouth every 6 (six) hours  as needed for indigestion or heartburn.   Yes [provider]  capsaicin (ZOSTRIX) 0.025 % cream Apply 1 application topically 2 (two) times daily as needed (apply to left ankle and feet as needed).   Yes [provider]  dextrose (GLUTOSE) 40 % GEL Take 1 Tube by mouth once as needed for low blood sugar (for levels <60).   Yes [provider]  diphenhydrAMINE (BENADRYL) 25 MG tablet Take 25 mg by mouth every 6 (six) hours as needed for itching.   Yes [provider]  enalapril (VASOTEC) 10 MG tablet TAKE (1) TABLET BY MOUTH TWICE DAILY. 06/07/16  Yes Mikey Kirschner, MD  finasteride (PROSCAR) 5 MG tablet Take 5 mg by mouth daily.  02/07/18  Yes [provider]  fluticasone (FLONASE) 50 MCG/ACT nasal spray Place 2 sprays into both nostrils daily.  02/12/18  Yes [provider]  GENERLAC 10 GM/15ML SOLN TAKE 30 ML BY MOUTH EVERY DAY 10/07/17  Yes Mikey Kirschner, MD  glipiZIDE (GLUCOTROL XL) 2.5 MG 24 hr tablet Take 1 tablet (2.5 mg total) by mouth daily with breakfast. 02/17/18  Yes Luking, Elayne Snare, MD  hydrOXYzine (ATARAX/VISTARIL) 25 MG tablet TAKE 1 TAB BY MOUTH AT BEDTIME FOR ITCHING Patient taking differently: Take 25 mg by mouth at bedtime.  02/07/18  Yes Mikey Kirschner, MD  loratadine (CLARITIN) 10 MG tablet TAKE 1 TABLET BY MOUTH ONCE DAILY FOR ALLERGIC RHINITIS. 09/02/14  Yes Luking, Elayne Snare, MD  lovastatin (MEVACOR) 40 MG tablet TAKE 1 TAB BY MOUTH AT BEDTIME -- DX: CHOLESTEROL Patient taking differently: Take 40 mg by mouth at bedtime.  02/17/18  Yes Kathyrn Drown, MD  magic mouthwash SOLN Take 15 mLs by mouth 4 (four) times daily as needed for mouth pain.   Yes [provider]  meclizine (ANTIVERT) 25 MG tablet TAKE 1 TAB BY MOUTH EVERY 6 HOURS AS NEEDED FOR DIZZINESS 02/13/18  Yes Luking, Elayne Snare, MD  Menthol, Topical Analgesic, (BIOFREEZE EX) Apply 1 application topically 4 (four) times daily as needed (for pain).   Yes  [provider]  metFORMIN (GLUCOPHAGE) 500 MG tablet TAKE 1 TAB BY MOUTH TWICE DAILY Patient taking differently: Take 500 mg by mouth 2 (two) times daily with a meal.  02/17/18  Yes Luking, Elayne Snare, MD  neomycin-bacitracin-polymyxin (NEOSPORIN) ointment Apply 1 application topically every 12 (twelve) hours. 03/04/18  Yes Mesner, Corene Cornea, MD  nortriptyline (PAMELOR) 10 MG capsule TAKE 1 CAPSULE (10 MG TOTAL) BY MOUTH AT BEDTIME. FOR NEUROPATHIC PAIN 12/28/17  Yes Mikey Kirschner, MD  Olopatadine HCl 0.2 % SOLN One drop each eye daily Patient taking differently: Apply 1 drop to eye daily. One drop each eye daily 03/16/17  Yes Mikey Kirschner, MD  omeprazole (PRILOSEC) 20 MG capsule Take 1 capsule (20 mg total) by mouth 2 (two) times daily before a meal. 06/07/16  Yes Mikey Kirschner, MD  polyethylene glycol powder (GLYCOLAX/MIRALAX) powder Take 17 g by mouth 2 (two) times daily.    Yes [provider]  QC ANTACID/ANTI-GAS [phone] MG/5ML suspension TAKE 2 TABLESPOONFULS (33ml) BY MOUTH EACH EVENING. 02/10/15  Yes Mikey Kirschner, MD  senna (SENOKOT) 8.6 MG TABS tablet TAKE 2 TABS (17.2MG ) BY MOUTH AT BEDTIME 11/21/17  Yes Mikey Kirschner, MD  SORE THROAT LIQD USE 5 SPRAYS IN THROAT EVERY 2 HOURS AS NEEDED FOR SORE THROAT. 01/08/15  Yes Mikey Kirschner, MD  traMADol (ULTRAM) 50 MG tablet Take 1 tablet (50 mg total) by mouth 3 (three) times daily. 01/06/18  Yes Mikey Kirschner, MD  traZODone (DESYREL) 100 MG tablet Take 1 tablet (100 mg total) by mouth at bedtime. qhs Patient taking differently: Take 100 mg by mouth at bedtime.  12/01/17  Yes Mikey Kirschner, MD  Zinc Oxide (DESITIN) 13 % CREA Apply 1 application topically daily as needed (rash).   Yes [provider]  Spacer/Aero-Holding Chambers (AEROCHAMBER MINI CHAMBER) DEVI Use as directed 09/01/15   Mikey Kirschner, MD    Family History Family History  Problem Relation Age of Onset  . Cancer Mother         Breast    Social History Social History   Tobacco Use  . Smoking status: Former Smoker    Packs/day: 0.50    Years: 54.00    Pack years: 27.00    Types: Cigarettes    Last attempt to quit: 11/19/1992    Years since quitting: 25.3  . Smokeless tobacco: Never Used  Substance Use Topics  . Alcohol use: Yes    Comment: Rare  . Drug use: No   Lives in assisted living facility.  DO NOT RESUSCITATE CODE STATUS  Allergies   Morphine and related and Opium   Review of Systems Review of Systems  Unable to perform ROS: Mental status change  Musculoskeletal: Positive for gait problem.       Wheelchair-bound  Skin: Positive for wound.       Sutured laceration at forehead  Allergic/Immunologic: Positive for immunocompromised state.       Diabetic  Neurological: Positive for weakness.       Generalized weakness     Physical Exam Updated Vital Signs BP 106/60 (BP Location: Left Arm)   Pulse 88   Temp 98.9 F (37.2 C) (Rectal)   Resp (!) 27   Ht 6\' 2"  (1.88 m)   Wt 79.4 kg (175 lb)   SpO2 95%   BMI 22.47 kg/m   Physical Exam  Constitutional:  Frail-appearing chronically ill-appearing  HENT:  Head: Normocephalic and atraumatic.  Sutured well-healing laceration at left eyebrow otherwise normocephalic atraumatic.  No facial asymmetry  Neck: Neck supple. No tracheal deviation present. No thyromegaly present.  Cardiovascular: Normal rate and regular rhythm.  No murmur heard. Pulmonary/Chest: Effort normal.  Diffuse rhonchi  Abdominal: Soft. Bowel sounds are normal. He exhibits no distension. There is no tenderness.  Genitourinary: Prostate normal. No penile tenderness.  Genitourinary Comments: Normal tone brown stool no gross blood scrotum normal  Musculoskeletal: Normal range of motion. He exhibits no edema or tenderness.  Neurological: Coordination normal.  Sleepy arousable to verbal stimulus simple commands moves all extremities  Skin: Skin is warm and dry. No rash  noted.  Psychiatric: He has a normal mood and affect.  Nursing note and vitals reviewed.    ED Treatments / Results  Labs (all labs  ordered are listed, but only abnormal results are displayed) Labs Reviewed  URINALYSIS, [place] BLOOD, ED    EKG None  Radiology No results found.  Procedures Procedures (including critical care time)  Medications Ordered in ED Medications - No data to display Chest viewed by me Results for orders placed or performed during the hospital encounter of 03/07/18  Urinalysis, Routine w reflex microscopic  Result Value Ref Range   Color, Urine YELLOW YELLOW   APPearance HAZY (A) CLEAR   Specific Gravity, Urine 1.020 1.005 - 1.030   pH 5.0 5.0 - 8.0   Glucose, UA NEGATIVE NEGATIVE mg/dL   Hgb urine dipstick NEGATIVE NEGATIVE   Bilirubin Urine NEGATIVE NEGATIVE   Ketones, ur NEGATIVE NEGATIVE mg/dL   Protein, ur NEGATIVE NEGATIVE mg/dL   Nitrite NEGATIVE NEGATIVE   Leukocytes, UA NEGATIVE NEGATIVE  Blood gas, arterial  Result Value Ref Range   O2 Content ROOM AIR L/min   pH, Arterial 7.411 7.350 - 7.450   pCO2 arterial 40.7 32.0 - 48.0 mmHg   pO2, Arterial 43.7 (L) 83.0 - 108.0 mmHg   Bicarbonate 25.1 20.0 - 28.0 mmol/L   Acid-Base Excess 1.2 0.0 - 2.0 mmol/L   O2 Saturation 77.6 %   Patient temperature 37.0    Collection site RIGHT RADIAL    Drawn by [phone]    Sample type ARTERIAL DRAW    Allens test (pass/fail) PASS PASS  CBC with Differential/Platelet  Result Value Ref Range   WBC 11.3 (H) 4.0 - 10.5 K/uL   RBC 3.93 (L) 4.22 - 5.81 MIL/uL   Hemoglobin 12.1 (L) 13.0 - 17.0 g/dL   HCT 37.0 (L) 39.0 - 52.0 %   MCV 94.1 78.0 - 100.0 fL   MCH 30.8 26.0 - 34.0 pg   MCHC 32.7 30.0 - 36.0 g/dL   RDW 14.6 11.5 - 15.5 %   Platelets 270 150 - 400 K/uL   Neutrophils Relative % 80 %   Neutro Abs 9.1 (H) 1.7 - 7.7 K/uL   Lymphocytes Relative 13 %   Lymphs Abs 1.4 0.7 - 4.0 K/uL   Monocytes Relative 7 %    Monocytes Absolute 0.8 0.1 - 1.0 K/uL   Eosinophils Relative 0 %   Eosinophils Absolute 0.0 0.0 - 0.7 K/uL   Basophils Relative 0 %   Basophils Absolute 0.0 0.0 - 0.1 K/uL  Comprehensive metabolic panel  Result Value Ref Range   Sodium 134 (L) 135 - 145 mmol/L   Potassium 3.9 3.5 - 5.1 mmol/L   Chloride 97 (L) 101 - 111 mmol/L   CO2 24 22 - 32 mmol/L   Glucose, Bld 161 (H) 65 - 99 mg/dL   BUN 57 (H) 6 - 20 mg/dL   Creatinine, Ser 2.03 (H) 0.61 - 1.24 mg/dL   Calcium 8.6 (L) 8.9 - 10.3 mg/dL   Total Protein 6.3 (L) 6.5 - 8.1 g/dL   Albumin 3.4 (L) 3.5 - 5.0 g/dL   AST 25 15 - 41 U/L   ALT 16 (L) 17 - 63 U/L   Alkaline Phosphatase 57 38 - 126 U/L   Total Bilirubin 0.5 0.3 - 1.2 mg/dL   GFR calc non Af Amer 25 (L) >60 mL/min   GFR calc Af Amer 29 (L) >60 mL/min   Anion gap 13 5 - 15  Brain natriuretic peptide  Result Value Ref Range   B Natriuretic Peptide 125.0 (H) 0.0 - 100.0 pg/mL  Troponin I  Result Value Ref Range   Troponin I 0.20 (HH) <0.03 ng/mL  POC occult blood, ED  Result Value Ref Range   Fecal Occult Bld NEGATIVE NEGATIVE  POC CBG, ED  Result Value Ref Range   Glucose-Capillary 176 (H) 65 - 99 mg/dL   Dg Chest 2 View  Result Date: 03/07/2018 CLINICAL DATA:  84 y/o  M; cough and shortness of breath. EXAM: CHEST - 2 VIEW COMPARISON:  08/10/2016 chest radiograph FINDINGS: Stable cardiomegaly given projection technique. Calcific aortic atherosclerosis. Elevated left hemidiaphragm and left basilar opacity. Trace right pleural effusion. No acute osseous abnormality. IMPRESSION: Elevated left hemidiaphragm and left basilar opacity which may represent associated atelectasis or pneumonia. Electronically Signed   By: Kristine Garbe M.D.   On: 03/07/2018 20:27   Dg Forearm Right  Result Date: 03/04/2018 CLINICAL DATA:  [place] in shower at care facility, skin tear. EXAM: RIGHT HUMERUS - 2+ VIEW; RIGHT FOREARM - 2 VIEW COMPARISON:  Chest radiograph August 10, 2016  FINDINGS: RIGHT humerus: There is no evidence of fracture or other focal bone lesions. Soft tissues are nonsuspicious. Tiny LEFT lung base nodular densities. RIGHT forearm: There is no evidence of fracture or other focal bone lesions. Soft tissues are nonsuspicious. Mild vascular calcifications. IMPRESSION: 1. No acute osseous process. 2. Tiny LEFT lung base nodular densities, possible granulomas. Electronically Signed   By: Elon Alas M.D.   On: 03/04/2018 17:24   Ct Head Wo Contrast  Result Date: 03/04/2018 CLINICAL DATA:  84 y/o M; fall in shower and broke tail. Laceration over right arm. EXAM: CT HEAD WITHOUT CONTRAST TECHNIQUE: Contiguous axial images were obtained from the base of the skull through the vertex without intravenous contrast. COMPARISON:  07/04/2017 CT head FINDINGS: Brain: No evidence of acute infarction, hemorrhage, hydrocephalus, extra-axial collection or mass lesion/mass effect. 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 Vascular: Calcific atherosclerosis of carotid siphons and vertebral arteries. No hyperdense vessel identified. Skull: Laceration to the right supraorbital scalp soft tissues. No acute fracture identified. Sinuses/Orbits: No acute finding. Other: None. IMPRESSION: 1. Laceration to the right supraorbital scalp soft tissues. 2. No acute fracture or intracranial abnormality identified. 3. 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 Electronically Signed   By: Kristine Garbe M.D.   On: 03/04/2018 18:12   Dg Humerus Right  Result Date: 03/04/2018 CLINICAL DATA:  [place] in shower at care facility, skin tear. EXAM: RIGHT HUMERUS - 2+ VIEW; RIGHT FOREARM - 2 VIEW COMPARISON:  Chest radiograph August 10, 2016 FINDINGS: RIGHT humerus: There is no  evidence of fracture or other focal bone lesions. Soft tissues are nonsuspicious. Tiny LEFT lung base nodular densities. RIGHT forearm: There is no evidence of fracture or other focal bone lesions. Soft tissues are nonsuspicious. Mild vascular calcifications. IMPRESSION: 1. No acute osseous process. 2. Tiny LEFT lung base nodular densities, possible granulomas. Electronically Signed   By: Elon Alas M.D.   On: 03/04/2018 17:24   Initial Impression / Assessment and Plan / ED Course  I have reviewed the triage vital signs and the nursing notes.  Pertinent labs & imaging results that were available during my care of the patient were reviewed by me and considered in my medical decision making (see chart for details).    Given chest x-ray findings, leukocytosis and hypoxia and rhonchi on lung exam, We will treat empirically  for community-acquired pneumonia with intravenous Rocephin and azithromycin.  Lab work consistent with arterial blood gas on room air showing hypoxia.   lab work also consistent with acute kidney injury.  Elevated troponin likely secondary to demand ischemia Consulted Dr.Shah hospitalist service who will arrange for overnight stay.  I have also consulted palliative care service to discuss goals of care with family patient also treated with supplemental oxygen Final Clinical Impressions(s) / ED Diagnoses  Diagnoses #1 community-acquired pneumonia #2 hypoxia #3 acute kidney injury #4 altered mental status Final diagnoses:  None  CRITICAL CARE Performed by: Orlie Dakin Total critical care time: 40 minutes Critical care time was exclusive of separately billable procedures and treating other patients. Critical care was necessary to treat or prevent imminent or life-threatening deterioration. Critical care was time spent personally by me on the following activities: development of treatment plan with patient and/or surrogate as well as nursing, discussions with consultants,  evaluation of patient's response to treatment, examination of patient, obtaining history from patient or surrogate, ordering and performing treatments and interventions, ordering and review of laboratory studies, ordering and review of radiographic studies, pulse oximetry and re-evaluation of patient's condition.  ED Discharge Orders    None       Orlie Dakin, MD 03/07/18 2241

## 2018-03-07 NOTE — Telephone Encounter (Signed)
I spoke with Sharyn Lull at [facility] and she states she has received the orders.

## 2018-03-07 NOTE — ED Notes (Signed)
Date and time results received: 03/07/18 2142 (use smartphrase ".now" to insert current time)  Test: troponin Critical Value: 0.20  Name of Provider Notified: Dr Winfred Leeds  Orders Received? Or Actions Taken?: Actions Taken: no orders received

## 2018-03-07 NOTE — Progress Notes (Signed)
Pt on 3L [place]. Turned O2 off. Will return to do ABG on room air later

## 2018-03-07 NOTE — Telephone Encounter (Signed)
Christian Thomas is needing an order sent over to remove stiches on right eyebrow. Please advise.

## 2018-03-07 NOTE — ED Notes (Signed)
Report given to 300 RN. 

## 2018-03-07 NOTE — ED Triage Notes (Signed)
Patient was sitting at the table and he went to sleep and the staff were unable to arouse. EMS states he speaks appropriately. Fall last Saturday and he has stiches over his right eyebrow.

## 2018-03-07 NOTE — Telephone Encounter (Signed)
Please advise 

## 2018-03-07 NOTE — Telephone Encounter (Signed)
Sure, thought I signed one, do at recommended interval from ER event

## 2018-03-08 ENCOUNTER — Other Ambulatory Visit: Payer: Self-pay

## 2018-03-08 ENCOUNTER — Encounter (HOSPITAL_COMMUNITY): Payer: Self-pay | Admitting: *Deleted

## 2018-03-08 ENCOUNTER — Telehealth: Payer: Self-pay | Admitting: Family Medicine

## 2018-03-08 DIAGNOSIS — R4182 Altered mental status, unspecified: Secondary | ICD-10-CM | POA: Diagnosis not present

## 2018-03-08 DIAGNOSIS — R269 Unspecified abnormalities of gait and mobility: Secondary | ICD-10-CM | POA: Diagnosis present

## 2018-03-08 DIAGNOSIS — G9340 Encephalopathy, unspecified: Secondary | ICD-10-CM

## 2018-03-08 DIAGNOSIS — K5909 Other constipation: Secondary | ICD-10-CM | POA: Diagnosis present

## 2018-03-08 DIAGNOSIS — N400 Enlarged prostate without lower urinary tract symptoms: Secondary | ICD-10-CM | POA: Diagnosis present

## 2018-03-08 DIAGNOSIS — I872 Venous insufficiency (chronic) (peripheral): Secondary | ICD-10-CM | POA: Diagnosis present

## 2018-03-08 DIAGNOSIS — J9601 Acute respiratory failure with hypoxia: Secondary | ICD-10-CM

## 2018-03-08 DIAGNOSIS — Z7189 Other specified counseling: Secondary | ICD-10-CM

## 2018-03-08 DIAGNOSIS — H9190 Unspecified hearing loss, unspecified ear: Secondary | ICD-10-CM | POA: Diagnosis present

## 2018-03-08 DIAGNOSIS — Z515 Encounter for palliative care: Secondary | ICD-10-CM

## 2018-03-08 DIAGNOSIS — G250 Essential tremor: Secondary | ICD-10-CM | POA: Diagnosis present

## 2018-03-08 DIAGNOSIS — G9341 Metabolic encephalopathy: Secondary | ICD-10-CM | POA: Diagnosis not present

## 2018-03-08 DIAGNOSIS — E1140 Type 2 diabetes mellitus with diabetic neuropathy, unspecified: Secondary | ICD-10-CM | POA: Diagnosis present

## 2018-03-08 DIAGNOSIS — I10 Essential (primary) hypertension: Secondary | ICD-10-CM | POA: Diagnosis present

## 2018-03-08 DIAGNOSIS — Z87898 Personal history of other specified conditions: Secondary | ICD-10-CM | POA: Diagnosis not present

## 2018-03-08 DIAGNOSIS — N179 Acute kidney failure, unspecified: Secondary | ICD-10-CM | POA: Diagnosis not present

## 2018-03-08 DIAGNOSIS — I4891 Unspecified atrial fibrillation: Secondary | ICD-10-CM | POA: Diagnosis present

## 2018-03-08 DIAGNOSIS — Z66 Do not resuscitate: Secondary | ICD-10-CM | POA: Diagnosis present

## 2018-03-08 DIAGNOSIS — E860 Dehydration: Secondary | ICD-10-CM | POA: Diagnosis present

## 2018-03-08 DIAGNOSIS — E43 Unspecified severe protein-calorie malnutrition: Secondary | ICD-10-CM | POA: Diagnosis not present

## 2018-03-08 DIAGNOSIS — E782 Mixed hyperlipidemia: Secondary | ICD-10-CM | POA: Diagnosis present

## 2018-03-08 DIAGNOSIS — Z6822 Body mass index (BMI) 22.0-22.9, adult: Secondary | ICD-10-CM | POA: Diagnosis not present

## 2018-03-08 DIAGNOSIS — J189 Pneumonia, unspecified organism: Secondary | ICD-10-CM | POA: Diagnosis not present

## 2018-03-08 DIAGNOSIS — R748 Abnormal levels of other serum enzymes: Secondary | ICD-10-CM | POA: Diagnosis present

## 2018-03-08 DIAGNOSIS — H3530 Unspecified macular degeneration: Secondary | ICD-10-CM | POA: Diagnosis present

## 2018-03-08 DIAGNOSIS — E876 Hypokalemia: Secondary | ICD-10-CM | POA: Diagnosis present

## 2018-03-08 DIAGNOSIS — M109 Gout, unspecified: Secondary | ICD-10-CM | POA: Diagnosis present

## 2018-03-08 DIAGNOSIS — Z8673 Personal history of transient ischemic attack (TIA), and cerebral infarction without residual deficits: Secondary | ICD-10-CM | POA: Diagnosis not present

## 2018-03-08 DIAGNOSIS — R296 Repeated falls: Secondary | ICD-10-CM | POA: Diagnosis present

## 2018-03-08 LAB — CBC
HCT: 34.6 % — ABNORMAL LOW (ref 39.0–52.0)
HEMOGLOBIN: 11.5 g/dL — AB (ref 13.0–17.0)
MCH: 31 pg (ref 26.0–34.0)
MCHC: 33.2 g/dL (ref 30.0–36.0)
MCV: 93.3 fL (ref 78.0–100.0)
PLATELETS: 246 10*3/uL (ref 150–400)
RBC: 3.71 MIL/uL — AB (ref 4.22–5.81)
RDW: 14.5 % (ref 11.5–15.5)
WBC: 9.5 10*3/uL (ref 4.0–10.5)

## 2018-03-08 LAB — BASIC METABOLIC PANEL
ANION GAP: 11 (ref 5–15)
BUN: 46 mg/dL — ABNORMAL HIGH (ref 6–20)
CALCIUM: 8.4 mg/dL — AB (ref 8.9–10.3)
CO2: 25 mmol/L (ref 22–32)
CREATININE: 1.33 mg/dL — AB (ref 0.61–1.24)
Chloride: 98 mmol/L — ABNORMAL LOW (ref 101–111)
GFR calc non Af Amer: 42 mL/min — ABNORMAL LOW (ref >60)
GFR, EST AFRICAN AMERICAN: 49 mL/min — AB (ref >60)
Glucose, Bld: 113 mg/dL — ABNORMAL HIGH (ref 65–99)
Potassium: 3.4 mmol/L — ABNORMAL LOW (ref 3.5–5.1)
SODIUM: 134 mmol/L — AB (ref 135–145)

## 2018-03-08 LAB — GLUCOSE, CAPILLARY
GLUCOSE-CAPILLARY: 86 mg/dL (ref 65–99)
GLUCOSE-CAPILLARY: 128 mg/dL — AB (ref 65–99)
Glucose-Capillary: 132 mg/dL — ABNORMAL HIGH (ref 65–99)
Glucose-Capillary: 141 mg/dL — ABNORMAL HIGH (ref 65–99)
Glucose-Capillary: 153 mg/dL — ABNORMAL HIGH (ref 65–99)

## 2018-03-08 MED ORDER — CEFTRIAXONE SODIUM 1 G IJ SOLR
1.0000 g | INTRAMUSCULAR | Status: DC
  Administered 2018-03-08 – 2018-03-09 (×2): 1 g via INTRAVENOUS
  Filled 2018-03-08 (×3): qty 10
  Filled 2018-03-08 (×2): qty 1

## 2018-03-08 NOTE — Progress Notes (Signed)
Pharmacy Antibiotic Note  Christian Thomas is a 84 y.o. male admitted on 03/07/2018 with pneumonia.  Pharmacy has been consulted for ROCEPHIN dosing.  Plan: Rocephin 1gm IV q24hrs Monitor labs, progress, c/s  Height: 6\' 2"  (188 cm) Weight: 175 lb (79.4 kg) IBW/kg (Calculated) : 82.2  Temp (24hrs), Avg:98.3 F (36.8 C), Min:97.4 F (36.3 C), Max:98.9 F (37.2 C)  Recent Labs  Lab 03/07/18 2020 03/08/18 0415  WBC 11.3* 9.5  CREATININE 2.03* 1.33*    Estimated Creatinine Clearance: 33.2 mL/min (A) (by C-G formula based on SCr of 1.33 mg/dL (H)).    Allergies  Allergen Reactions  . Morphine And Related   . Opium    Antimicrobials this admission: Rocephin 4/23 >>  Zithromax 4/23 >>   Dose adjustments this admission:  Microbiology results:  BCx: pending  UCx: pending   Sputum:    MRSA PCR:   Thank you for allowing pharmacy to be a part of this patient's care.  Hart Robinsons A 03/08/2018 10:41 AM

## 2018-03-08 NOTE — Telephone Encounter (Signed)
Pt niece is aware

## 2018-03-08 NOTE — Progress Notes (Signed)
PROGRESS NOTE    ADVAY VOLANTE  XQJ:[id number]  DOB: June 29, 1915  DOA: 03/07/2018 PCP: Mikey Kirschner, MD   Brief Admission Hx:  Christian Thomas is a 84 y.o. male with medical history significant for progressive weakness, hypertension, dyslipidemia, BPH, and type 2 diabetes who presents from his assisted living facility after he was noted to be unarousable despite sternal rub.  He apparently fell asleep earlier this evening at approximately 6 PM.  He has been dealing with progressive generalized weakness with recurrent falls.  Patient is otherwise a difficult historian and very hard of hearing.  He states that he has had some chills but no fevers and does have a cough with yellowish sputum noted.  He has poor appetite and denies any nausea, vomiting or diarrhea.  MDM/Assessment & Plan:   1. Acute metabolic encephalopathy-multifactorial.  I believe that much of this is related to pneumonia as well as AKI. Continue treatment for these as outlined below with neurochecks.  Will consult palliative care due to advanced age and multifactorial medical issues.  Patient is DNR/DNI. 2. Community Acquired Pneumonia.  Cultures pending, continue Rocephin and azithromycin.   Follow sputum cultures if he can produce productive sputum.  Mucinex twice daily.  Duo nebs as needed for shortness of breath or wheezing.  Pro-calcitonin per lower respiratory protocol. 3. Acute hypoxemic respiratory failure likely secondary to above.  Continue treatment for pneumonia with incentive spirometry and up to chair.  Wean oxygen as tolerated. 4. Dehydration with AKI.  Avoid nephrotoxic agents and recheck a.m. renal panel.  Continue on gentle IV fluid as patient appears slightly dry.  His renal function is slowly improving with IV fluids.  5. Diabetes.  Sliding scale insulin and hold oral medications.  Blood sugars stable and controlled.  6. Hypertension.  Continue amlodipine and hold ACE inhibitor due to  AKI. 7. Progressive weakness with ambulatory dysfunction.  Fall precautions.  PT evaluation pending. 8. Elevated troponin.  Likely secondary to AK I.  No significant findings noted on EKG.    DVT prophylaxis: Heparin Code Status: DNR/DNI Family Communication: None currently at bedside Disposition Plan: pending ongoing acute medical treatment Consults called: None Admission status: Observation, telemetry  Subjective: Pt complains of being thirsty  Objective: Vitals:   03/07/18 2300 03/08/18 0016 03/08/18 0019 03/08/18 0544  BP: 140/78 (!) 143/85  (!) 148/82  Pulse: 93 97 (!) 102 (!) 107  Resp: (!) 26 19  20   Temp:  98.5 F (36.9 C)  98.3 F (36.8 C)  TempSrc:  Oral  Oral  SpO2: 94% (!) 86% 90% 92%  Weight:      Height:        Intake/Output Summary (Last 24 hours) at 03/08/2018 4481 Last data filed at 03/08/2018 0600 Gross per 24 hour  Intake 795 ml  Output 200 ml  Net 595 ml   Filed Weights   03/07/18 1847  Weight: 79.4 kg (175 lb)     REVIEW OF SYSTEMS  As per history otherwise all reviewed and reported negative  Exam:  General exam: thin, elderly male, hard of hearing, NAD, cooperative, oriented x 3. Dry mucus membranes.  Respiratory system: Clear. No increased work of breathing. Cardiovascular system: S1 & S2 heard.  Gastrointestinal system: Abdomen is nondistended, soft and nontender. Normal bowel sounds heard. Central nervous system: Alert and oriented. No focal neurological deficits. Extremities: no CCE.  Data Reviewed: Basic Metabolic Panel: Recent Labs  Lab 03/07/18 2020 03/08/18 0415  NA 134* 134*  K 3.9 3.4*  CL 97* 98*  CO2 24 25  GLUCOSE 161* 113*  BUN 57* 46*  CREATININE 2.03* 1.33*  CALCIUM 8.6* 8.4*   Liver Function Tests: Recent Labs  Lab 03/07/18 2020  AST 25  ALT 16*  ALKPHOS 57  BILITOT 0.5  PROT 6.3*  ALBUMIN 3.4*   No results for input(s): LIPASE, AMYLASE in the last 168 hours. No results for input(s): AMMONIA in  the last 168 hours. CBC: Recent Labs  Lab 03/07/18 2020 03/08/18 0415  WBC 11.3* 9.5  NEUTROABS 9.1*  --   HGB 12.1* 11.5*  HCT 37.0* 34.6*  MCV 94.1 93.3  PLT 270 246   Cardiac Enzymes: Recent Labs  Lab 03/07/18 2020  TROPONINI 0.20*   CBG (last 3)  Recent Labs    03/07/18 2117 03/08/18 0022 03/08/18 0806  GLUCAP 176* 153* 86   Recent Results (from the past 240 hour(s))  Culture, blood (Routine X 2) w Reflex to ID Panel     Status: None (Preliminary result)   Collection Time: 03/07/18 10:21 PM  Result Value Ref Range Status   Specimen Description LEFT ANTECUBITAL  Final   Special Requests   Final    BOTTLES DRAWN AEROBIC AND ANAEROBIC Blood Culture adequate volume   Culture   Final    NO GROWTH < 12 HOURS Performed at [facility], [address] [place], [place] [address]    Report Status PENDING  Incomplete  Culture, blood (Routine X 2) w Reflex to ID Panel     Status: None (Preliminary result)   Collection Time: 03/07/18 10:24 PM  Result Value Ref Range Status   Specimen Description BLOOD RIGHT WRIST  Final   Special Requests   Final    BOTTLES DRAWN AEROBIC AND ANAEROBIC Blood Culture adequate volume   Culture   Final    NO GROWTH < 12 HOURS Performed at [facility], [address], [place], [place] [address]    Report Status PENDING  Incomplete     Studies: Dg Chest 2 View  Result Date: 03/07/2018 CLINICAL DATA:  84 y/o  M; cough and shortness of breath. EXAM: CHEST - 2 VIEW COMPARISON:  08/10/2016 chest radiograph FINDINGS: Stable cardiomegaly given projection technique. Calcific aortic atherosclerosis. Elevated left hemidiaphragm and left basilar opacity. Trace right pleural effusion. No acute osseous abnormality. IMPRESSION: Elevated left hemidiaphragm and left basilar opacity which may represent associated atelectasis or pneumonia. Electronically Signed   By: Kristine Garbe M.D.   On: 03/07/2018 20:27     Scheduled Meds: . allopurinol   100 mg Oral Daily  . amLODipine  5 mg Oral Daily  . dextromethorphan-guaiFENesin  1 tablet Oral BID  . finasteride  5 mg Oral Daily  . fluticasone  2 spray Each Nare Daily  . heparin  5,000 Units Subcutaneous Q8H  . insulin aspart  0-5 Units Subcutaneous QHS  . insulin aspart  0-9 Units Subcutaneous TID WC   Continuous Infusions: . azithromycin    . cefTRIAXone (ROCEPHIN)  IV    . lactated ringers 75 mL/hr at 03/08/18 0300    Principal Problem:   Acute metabolic encephalopathy Active Problems:   Pneumonia   AKI (acute kidney injury) ([place])   History of progressive weakness   Acute hypoxemic respiratory failure (HCC)   Encephalopathy acute   Time spent:   Irwin Brakeman, MD, FAAFP [facility] Pager [phone]  If 7PM-7AM, please contact night-coverage [email or web address] Password [facility] 03/08/2018, 9:27 AM  LOS: 0 days

## 2018-03-08 NOTE — Consult Note (Signed)
Consultation Note Date: 03/08/2018   Patient Name: Christian Thomas  DOB: 1915-09-15  MRN: [id number]  Age / Sex: 84 y.o., male  PCP: Mikey Kirschner, MD Referring Physician: Murlean Iba, MD  Reason for Consultation: Establishing goals of care and Psychosocial/spiritual support  HPI/Patient Profile: 84 y.o. male  with past medical history of BPH, type 2 diabetes, high blood pressure and cholesterol, history of gout, diverticulosis, history of CDF infection, cerebellar stroke syndrome, benign essential tremor, admitted on 03/07/2018 with acute metabolic encephalopathy multifactorial related to pneumonia and acute kidney injury.   Clinical Assessment and Goals of Care: [name] is resting quietly in bed.  He wakes easily making them briefly keeping eye contact.  He is able to make his basic needs known, but is oriented to self only at this time.  There is no family at bedside at this time.  [name] states that he would like water.  Upon taking water he clearly aspirates, coughing and eyes watering.  It seems as he is attempted to speak before all the water has been swallowed.    Call to niece, healthcare power of attorney, [name].  She shares that she is present in the hospital at this time.  I returned to [name] room for a family conference.   [name] states that he has lived at [facility] assisted living since 2012. He and his wife moved there, due to her COPD.  [name] wife Bonnita Nasuti has since passed away (here in the ED at [facility]).  They both worked at [employer].  We talked about Blanch Media power of attorney, advanced directives for [name].  She shares that he has made his wishes known to her through multiple conversations through the years.  We talked about what is next.  We talked about what gives [name] pleasure.  [name] shares that he used to enjoy fishing, watching baseball on  television (he is no longer able to do this), what gives him pleasure now is visits from family.During our conversation [name]'s daughter Janace Hoard arrives.  She stays for a few minutes but then leaves.  We talked about [name]'s current and chronic health concerns.  [name] states that there has been an "unbelievable decline" since his fall on Saturday.  As we are talking [name] asks for more water.  He again clearly aspirates.  [name] and I talk about aspiration.  I share my worries about treating pneumonia, but recurrence of pneumonia due to aspiration.  [name] is now n.p.o., speech therapy consulted.  Blanch Media states that [name] has been very confused.  We talked about causes of delirium including electrolyte imbalance (current problem), infection (positive for pneumonia), and end-of-life (I share that without modern medicine, IV fluids and antibiotics, [name] would have passed).  We talked about CODE STATUS, [name] elected to allow a natural death.  We talked about the concept of "treat the treatable".  We also talked about preferred place of death.  [name] states that her uncle  always said that he would not leave [place] until he went to [facility] [employer] [facility].  I share a diagram of the chronic illness pathway, what is normal and expected.  Also show a diagram regarding quick recovery usually equals more recovery versus slower recovery leading to poor physical functioning.  I share my worry about [name] becoming so sick that he is not able to leave the hospital.  We talked about giving [name] 24 to 48 hours for outcomes.  We also talked about how we would continue to care for him if he does not recover, including keeping his skin warm and dry, giving medicines for pain, anxiety and breathlessness, focusing on things that are important to him.  We talked about hospice at [facility].  I share that [name] has a contract with hospice and palliative care of [place], they are available to assist.  [name]'s  mother also passed away at [place] with hospice.  She shares that there is a Insurance underwriter on-site who can help schedule around-the-clock sitters/care.  We talked about unburdening [name] for medications that are changing outcomes.  [name] agrees to DC cholesterol medication, change in bowel regimen.  We are planning for a family meeting 4/25 at 1 PM.  Healthcare power of attorney HCPOA -niece, Tommie Ard, and her husband Timmothy Sours.   SUMMARY OF RECOMMENDATIONS   24 to 48 hours for outcomes. Regardless of outcome, return to [place] with hospice and palliative care of [place].  Code Status/Advance Care Planning:  DNR  Symptom Management:   Per hospitalist, no additional needs at this time.  Palliative Prophylaxis:   Aspiration and Turn Reposition  Additional Recommendations (Limitations, Scope, Preferences):  No Artificial Feeding and Treat the treatable but no CPR, no intubation.  Return to [facility] with hospice  Psycho-social/Spiritual:   Desire for further Chaplaincy support:no  Additional Recommendations: Caregiving  Support/Resources and Education on Hospice  Prognosis:   Unable to determine, based on outcomes.  Discharge Planning: To be determined, based on outcomes.  Likely return to ALF, ? With hospice.      Primary Diagnoses: Present on Admission: . Encephalopathy acute . Acute renal failure (ARF) ([place])   I have reviewed the medical record, interviewed the patient and family, and examined the patient. The following aspects are pertinent.  Past Medical History:  Diagnosis Date  . Benign essential tremor   . BPH (benign prostatic hypertrophy)   . Cerebellar stroke syndrome   . CHF (congestive heart failure) ([place])   . Chronic constipation   . Clostridium difficile colitis 12/2008  . DDD (degenerative disc disease), lumbar   . Diabetic neuropathy ([place])   . Diverticulosis   . Dysrhythmia   . Essential hypertension, benign   . Gout   . Macular  degeneration   . Mixed hyperlipidemia   . Stroke ([place])   . Type 2 diabetes mellitus ([place])   . Venous insufficiency    Social History   Socioeconomic History  . Marital status: Married    Spouse name: Not on file  . Number of children: Not on file  . Years of education: Not on file  . Highest education level: Not on file  Occupational History  . Occupation: Retired    Fish farm manager: RETIRED    Comment: [employer]  . Financial resource strain: Not on file  . Food insecurity:    Worry: Not on file    Inability: Not on file  . Transportation needs:    Medical: Not  on file    Non-medical: Not on file  Tobacco Use  . Smoking status: Former Smoker    Packs/day: 0.50    Years: 54.00    Pack years: 27.00    Types: Cigarettes    Last attempt to quit: 11/19/1992    Years since quitting: 25.3  . Smokeless tobacco: Never Used  Substance and Sexual Activity  . Alcohol use: Yes    Comment: Rare  . Drug use: No  . Sexual activity: Never  Lifestyle  . Physical activity:    Days per week: Not on file    Minutes per session: Not on file  . Stress: Not on file  Relationships  . Social connections:    Talks on phone: Not on file    Gets together: Not on file    Attends religious service: Not on file    Active member of club or organization: Not on file    Attends meetings of clubs or organizations: Not on file    Relationship status: Not on file  Other Topics Concern  . Not on file  Social History Narrative  . Not on file   Family History  Problem Relation Age of Onset  . Cancer Mother        Breast   Scheduled Meds: . allopurinol  100 mg Oral Daily  . amLODipine  5 mg Oral Daily  . dextromethorphan-guaiFENesin  1 tablet Oral BID  . finasteride  5 mg Oral Daily  . fluticasone  2 spray Each Nare Daily  . heparin  5,000 Units Subcutaneous Q8H  . insulin aspart  0-5 Units Subcutaneous QHS  . insulin aspart  0-9 Units Subcutaneous TID WC    Continuous Infusions: . azithromycin    . cefTRIAXone (ROCEPHIN)  IV     PRN Meds:.acetaminophen **OR** acetaminophen, calcium carbonate, ipratropium-albuterol, ondansetron **OR** ondansetron (ZOFRAN) IV Medications Prior to Admission:  Prior to Admission medications   Medication Sig Start Date End Date Taking? Authorizing Provider  acetaminophen (TYLENOL) 500 MG tablet Take 1,000 mg by mouth 2 (two) times daily. 2 bid    Yes [provider]  albuterol (PROVENTIL HFA;VENTOLIN HFA) 108 (90 Base) MCG/ACT inhaler Inhale 2 puffs into the lungs 4 (four) times daily as needed for wheezing or shortness of breath.   Yes [provider]  albuterol (PROVENTIL) (2.5 MG/3ML) 0.083% nebulizer solution Take 3 mLs (2.5 mg total) by nebulization every 4 (four) hours as needed for wheezing or shortness of breath. 04/04/17  Yes Mikey Kirschner, MD  allopurinol (ZYLOPRIM) 300 MG tablet TAKE 1 TAB BY MOUTH EVERY DAY -- DX: GOUT 10/28/17  Yes Mikey Kirschner, MD  ALPRAZolam Duanne Moron) 0.25 MG tablet Take 1 tablet BID Patient taking differently: Take 0.25 mg by mouth 2 (two) times daily. Take 1 tablet BID 03/03/18  Yes Luking, Elayne Snare, MD  alum & mag hydroxide-simeth ([place]) 200-200-20 MG/5ML suspension Take 30 mLs by mouth every evening.   Yes [provider]  amLODipine (NORVASC) 5 MG tablet Take 5 mg by mouth daily.  02/12/18  Yes [provider]  calcium carbonate (TUMS - DOSED IN MG ELEMENTAL CALCIUM) 500 MG chewable tablet Chew 2 tablets by mouth every 6 (six) hours as needed for indigestion or heartburn.   Yes [provider]  capsaicin (ZOSTRIX) 0.025 % cream Apply 1 application topically 2 (two) times daily as needed (apply to left ankle and feet as needed).   Yes [provider]  dextrose (GLUTOSE) 40 %  GEL Take 1 Tube by mouth once as needed for low blood sugar (for levels <60).   Yes [provider]  diphenhydrAMINE (BENADRYL) 25 MG tablet  Take 25 mg by mouth every 6 (six) hours as needed for itching.   Yes [provider]  enalapril (VASOTEC) 10 MG tablet TAKE (1) TABLET BY MOUTH TWICE DAILY. 06/07/16  Yes Mikey Kirschner, MD  finasteride (PROSCAR) 5 MG tablet Take 5 mg by mouth daily.  02/07/18  Yes [provider]  fluticasone (FLONASE) 50 MCG/ACT nasal spray Place 2 sprays into both nostrils daily.  02/12/18  Yes [provider]  GENERLAC 10 GM/15ML SOLN TAKE 30 ML BY MOUTH EVERY DAY 10/07/17  Yes Mikey Kirschner, MD  glipiZIDE (GLUCOTROL XL) 2.5 MG 24 hr tablet Take 1 tablet (2.5 mg total) by mouth daily with breakfast. 02/17/18  Yes Luking, Elayne Snare, MD  hydrOXYzine (ATARAX/VISTARIL) 25 MG tablet TAKE 1 TAB BY MOUTH AT BEDTIME FOR ITCHING Patient taking differently: Take 25 mg by mouth at bedtime.  02/07/18  Yes Mikey Kirschner, MD  loratadine (CLARITIN) 10 MG tablet TAKE 1 TABLET BY MOUTH ONCE DAILY FOR ALLERGIC RHINITIS. 09/02/14  Yes Luking, Elayne Snare, MD  lovastatin (MEVACOR) 40 MG tablet TAKE 1 TAB BY MOUTH AT BEDTIME -- DX: CHOLESTEROL Patient taking differently: Take 40 mg by mouth at bedtime.  02/17/18  Yes Kathyrn Drown, MD  magic mouthwash SOLN Take 15 mLs by mouth 4 (four) times daily as needed for mouth pain.   Yes [provider]  meclizine (ANTIVERT) 25 MG tablet TAKE 1 TAB BY MOUTH EVERY 6 HOURS AS NEEDED FOR DIZZINESS 02/13/18  Yes Luking, Elayne Snare, MD  Menthol, Topical Analgesic, (BIOFREEZE EX) Apply 1 application topically 4 (four) times daily as needed (for pain).   Yes [provider]  metFORMIN (GLUCOPHAGE) 500 MG tablet TAKE 1 TAB BY MOUTH TWICE DAILY Patient taking differently: Take 500 mg by mouth 2 (two) times daily with a meal.  02/17/18  Yes Luking, Elayne Snare, MD  neomycin-bacitracin-polymyxin (NEOSPORIN) ointment Apply 1 application topically every 12 (twelve) hours. 03/04/18  Yes Mesner, Corene Cornea, MD  nortriptyline (PAMELOR) 10 MG capsule TAKE 1 CAPSULE (10 MG TOTAL) BY  MOUTH AT BEDTIME. FOR NEUROPATHIC PAIN 12/28/17  Yes Mikey Kirschner, MD  Olopatadine HCl 0.2 % SOLN One drop each eye daily Patient taking differently: Apply 1 drop to eye daily. One drop each eye daily 03/16/17  Yes Mikey Kirschner, MD  omeprazole (PRILOSEC) 20 MG capsule Take 1 capsule (20 mg total) by mouth 2 (two) times daily before a meal. 06/07/16  Yes Mikey Kirschner, MD  polyethylene glycol powder (GLYCOLAX/MIRALAX) powder Take 17 g by mouth 2 (two) times daily.    Yes [provider]  QC ANTACID/ANTI-GAS [phone] MG/5ML suspension TAKE 2 TABLESPOONFULS (52ml) BY MOUTH EACH EVENING. 02/10/15  Yes Mikey Kirschner, MD  senna (SENOKOT) 8.6 MG TABS tablet TAKE 2 TABS (17.2MG ) BY MOUTH AT BEDTIME 11/21/17  Yes Mikey Kirschner, MD  SORE THROAT LIQD USE 5 SPRAYS IN THROAT EVERY 2 HOURS AS NEEDED FOR SORE THROAT. 01/08/15  Yes Mikey Kirschner, MD  traMADol (ULTRAM) 50 MG tablet Take 1 tablet (50 mg total) by mouth 3 (three) times daily. 01/06/18  Yes Mikey Kirschner, MD  traZODone (DESYREL) 100 MG tablet Take 1 tablet (100 mg total) by mouth at bedtime. qhs Patient taking differently: Take 100 mg by mouth at bedtime.  12/01/17  Yes Mikey Kirschner, MD  Zinc Oxide (DESITIN) 13 % CREA Apply 1 application topically daily as needed (rash).   Yes [provider]  Spacer/Aero-Holding Chambers (AEROCHAMBER MINI CHAMBER) DEVI Use as directed 09/01/15   Mikey Kirschner, MD   Allergies  Allergen Reactions  . Morphine And Related   . Opium    Review of Systems  Unable to perform ROS: Age    Physical Exam  Constitutional: No distress.  Appears frail, acutely/chronically ill.  HENT:  Head: Atraumatic.  Temporal wasting  Cardiovascular: Normal rate.  Pulmonary/Chest: Effort normal. No respiratory distress.  Abdominal: Soft. He exhibits no distension.  Musculoskeletal: He exhibits no edema.  Neurological: He is alert.  Skin: Skin is warm and dry.  Psychiatric:  Calm  and cooperative, not fearful  Nursing note and vitals reviewed.   Vital Signs: BP (!) 148/82 (BP Location: Right Arm)   Pulse (!) 107   Temp 98.3 F (36.8 C) (Oral)   Resp 20   Ht 6\' 2"  (1.88 m)   Wt 79.4 kg (175 lb)   SpO2 92%   BMI 22.47 kg/m  Pain Scale: 0-10   Pain Score: 0-No pain   SpO2: SpO2: 92 % O2 Device:SpO2: 92 % O2 Flow Rate: .O2 Flow Rate (L/min): 3 L/min  IO: Intake/output summary:   Intake/Output Summary (Last 24 hours) at 03/08/2018 1130 Last data filed at 03/08/2018 0600 Gross per 24 hour  Intake 795 ml  Output 200 ml  Net 595 ml    LBM: Last BM Date: 03/07/18 Baseline Weight: Weight: 79.4 kg (175 lb) Most recent weight: Weight: 79.4 kg (175 lb)     Palliative Assessment/Data:   Flowsheet Rows     Most Recent Value  Intake Tab  Referral Department  Hospitalist  Unit at Time of Referral  Med/Surg Unit  Palliative Care Primary Diagnosis  Pulmonary  Date Notified  03/07/18  Palliative Care Type  New Palliative care  Reason for referral  Psychosocial or Spiritual support, Clarify Goals of Care  Date of Admission  03/07/18  Date first seen by Palliative Care  03/08/18  # of days Palliative referral response time  1 Day(s)  # of days IP prior to Palliative referral  0  Clinical Assessment  Palliative Performance Scale Score  30%  Pain Max last 24 hours  Not able to report  Pain Min Last 24 hours  Not able to report  Dyspnea Max Last 24 Hours  Not able to report  Dyspnea Min Last 24 hours  Not able to report  Psychosocial & Spiritual Assessment  Palliative Care Outcomes  Patient/Family meeting held?  Yes  Who was at the meeting?  Niece [name]  Palliative Care Outcomes  Clarified goals of care, Provided psychosocial or spiritual support  Patient/Family wishes: Interventions discontinued/not started   Mechanical Ventilation      Time In: 1030 Time Out: 1145 Time Total: 75 minutes Greater than 50%  of this time was spent counseling and  coordinating care related to the above assessment and plan.  Signed by: Drue Novel, NP   Please contact Palliative Medicine Team phone at [phone] for questions and concerns.  For individual provider: See Shea Evans

## 2018-03-08 NOTE — Telephone Encounter (Signed)
Pt's family called to let the Dr. Gwyndolyn Kaufman that the pt is currently in the hospital.

## 2018-03-08 NOTE — Telephone Encounter (Signed)
Let family know that I already knew, planning to drop by tomorrow

## 2018-03-08 NOTE — Progress Notes (Signed)
Initial Nutrition Assessment  DOCUMENTATION CODES:   Severe malnutrition in context of acute illness/injury(Pneumonia, AKI (dehydrated on admission))  INTERVENTION:  If pt is appropriate for diet advancement following swallow study will initiate oral nutrition supplement    NUTRITION DIAGNOSIS:   Severe Malnutrition(acute PNA (metabolic encephalopathy)) related to acute illness, decreased appetite as evidenced by energy intake < or equal to 50% for > or equal to 5 days, severe muscle depletion, severe fat depletion.   GOAL:  Promote quality of life, provide support and honor food preferences as able within scope of goals   MONITOR:   PO intake, Skin, I & O's, Supplement acceptance  REASON FOR ASSESSMENT:   Malnutrition Screening Tool/ Low Braden     ASSESSMENT:  Mr Christian Thomas is a pleasant 84 yo gentleman who has been living at [facility] more than 5 years and plans to return there barring unforseen circumstances.   Patient presents to ED with acute metabolic encephalopathy ( PNA and dehydrated with AKI). Palliative medicine has addressed goals of care and the plan presently is for him to return to [facility] possibly with Hospice.  He had an episode of aspiration yesterday during palliative assessment and is currently NPO waiting for swallow evaluation.   Patient is minimally responsive to questions. Family members are here and help with nutrition background.  They tell me he has continued to become more weak and having repeated falls. In fact he has stitches on his right forehead due to a recent fall.   His appetite has deteriorated over the past 2-3 months as well. He has experienced taste changes and his overall intake has decreased. He ate a few bites of breakfast (eggs, toast) yesterday per family. He has had very little intake for the past week prior to admission.  Patient usual weight is 180-183 lb. Acute weight loss of 5 lb (3%) in 1 week. He has moderate-severe muscle and  fat loss in upper arms, clavicle, buccal and temporal regions.   Labs: BMP Latest Ref Rng & Units 03/08/2018 03/07/2018 02/24/2018  Glucose 65 - 99 mg/dL 113(H) 161(H) 101(H)  BUN 6 - 20 mg/dL 46(H) 57(H) 20  Creatinine 0.61 - 1.24 mg/dL 1.33(H) 2.03(H) 1.08  BUN/Creat Ratio 10 - 24 - - 19  Sodium 135 - 145 mmol/L 134(L) 134(L) 140  Potassium 3.5 - 5.1 mmol/L 3.4(L) 3.9 5.0  Chloride 101 - 111 mmol/L 98(L) 97(L) 100  CO2 22 - 32 mmol/L 25 24 25   Calcium 8.9 - 10.3 mg/dL 8.4(L) 8.6(L) 9.4    Meds: . allopurinol  100 mg Oral Daily  . amLODipine  10 mg Oral Daily  . finasteride  5 mg Oral Daily  . fluticasone  2 spray Each Nare Daily  . heparin  5,000 Units Subcutaneous Q8H     NUTRITION - FOCUSED PHYSICAL EXAM:    Most Recent Value  Orbital Region  Moderate depletion  Upper Arm Region  Severe depletion  Thoracic and Lumbar Region  Moderate depletion  Buccal Region  Moderate depletion  Temple Region  Moderate depletion  Clavicle Bone Region  Severe depletion  Clavicle and Acromion Bone Region  Severe depletion  Dorsal Hand  Moderate depletion  Patellar Region  Moderate depletion  Anterior Thigh Region  Moderate depletion  Edema (RD Assessment)  Mild  Hair  Reviewed  Eyes  Reviewed  Mouth  Reviewed  Skin  Reviewed  Nails  Reviewed      Diet Order:  No diet orders on file  EDUCATION NEEDS:  No education needs have been identified at this time Skin:  Skin Assessment: Reviewed RN Assessment(MASD)  Last BM:  4/23  Height:   Ht Readings from Last 1 Encounters:  03/07/18 6\' 2"  (1.88 m)    Weight:   Wt Readings from Last 1 Encounters:  03/07/18 175 lb (79.4 kg)    Ideal Body Weight:  86 kg  BMI:  Body mass index is 22.47 kg/m.  Estimated Nutritional Needs:   Kcal:  1950-2130  Protein:  90-97 gr  Fluid:  2.0-2.1 liters daily    Colman Cater MS,RD,CSG,LDN Office: [phone] Pager: [phone]

## 2018-03-09 DIAGNOSIS — E43 Unspecified severe protein-calorie malnutrition: Secondary | ICD-10-CM

## 2018-03-09 LAB — GLUCOSE, CAPILLARY
GLUCOSE-CAPILLARY: 120 mg/dL — AB (ref 65–99)
GLUCOSE-CAPILLARY: 147 mg/dL — AB (ref 65–99)
GLUCOSE-CAPILLARY: 126 mg/dL — AB (ref 65–99)

## 2018-03-09 MED ORDER — SODIUM CHLORIDE 0.9 % IV SOLN
INTRAVENOUS | Status: AC
  Administered 2018-03-09: 11:00:00 via INTRAVENOUS

## 2018-03-09 MED ORDER — METOPROLOL TARTRATE 5 MG/5ML IV SOLN
5.0000 mg | Freq: Once | INTRAVENOUS | Status: AC
  Administered 2018-03-10: 5 mg via INTRAVENOUS
  Filled 2018-03-09: qty 5

## 2018-03-09 MED ORDER — AMLODIPINE BESYLATE 5 MG PO TABS
10.0000 mg | ORAL_TABLET | Freq: Every day | ORAL | Status: DC
  Administered 2018-03-10: 10 mg via ORAL
  Filled 2018-03-09: qty 2

## 2018-03-09 MED ORDER — DM-GUAIFENESIN ER 30-600 MG PO TB12: 1.0000 | ORAL_TABLET | Freq: Two times a day (BID) | ORAL | Status: DC | PRN

## 2018-03-09 MED ORDER — HYDRALAZINE HCL 20 MG/ML IJ SOLN
10.0000 mg | INTRAMUSCULAR | Status: DC | PRN
  Administered 2018-03-09: 10 mg via INTRAVENOUS
  Filled 2018-03-09: qty 1

## 2018-03-09 NOTE — Progress Notes (Signed)
PROGRESS NOTE    Christian Thomas  ZDG:[id number]  DOB: [birth year]  DOA: 03/07/2018 PCP: Mikey Kirschner, MD   Brief Admission Hx:  Christian Thomas is a 84 y.o. male with medical history significant for progressive weakness, hypertension, dyslipidemia, BPH, and type 2 diabetes who presents from his assisted living facility after he was noted to be unarousable despite sternal rub.  He apparently fell asleep earlier this evening at approximately 6 PM.  He has been dealing with progressive generalized weakness with recurrent falls.  Patient is otherwise a difficult historian and very hard of hearing.  He states that he has had some chills but no fevers and does have a cough with yellowish sputum noted.  He has poor appetite and denies any nausea, vomiting or diarrhea.  MDM/Assessment & Plan:   1. Acute metabolic encephalopathy-multifactorial.  I believe that much of this is related to pneumonia as well as AKI. Continue treatment for these as outlined below with neurochecks. Consulted palliative care due to advanced age and multifactorial medical issues.  Patient is DNR/DNI. 2. Community Acquired Pneumonia.  Cultures pending, continue Rocephin and azithromycin.   Follow sputum cultures if he can produce productive sputum.  Mucinex twice daily.  Duo nebs as needed for shortness of breath or wheezing.  Pro-calcitonin per lower respiratory protocol. 3. Acute hypoxemic respiratory failure likely secondary to above.  Continue treatment for pneumonia with incentive spirometry and up to chair.  Wean oxygen as tolerated. 4. Dehydration with AKI.  Avoid nephrotoxic agents and recheck a.m. renal panel.  Continue on gentle IV fluid as patient appears slightly dry.  His renal function is slowly improving with IV fluids.  5. Diabetes.  Blood sugars stable and controlled. DC SSI and cbg testing.  6. Hypertension.  Continue amlodipine and hold ACE inhibitor due to AKI.  Hydralazine ordered as needed.   7. Progressive weakness with ambulatory dysfunction.  Fall precautions.  PT evaluation pending. 8. Elevated troponin.  Likely secondary to AKI.  No significant findings noted on EKG.    DVT prophylaxis: Heparin Code Status: DNR/DNI Family Communication: None currently at bedside Disposition Plan: pending ongoing acute medical treatment Consults called: None Admission status: Observation, telemetry  Subjective: Pt complains of being thirsty  Objective: Vitals:   03/08/18 0544 03/08/18 1548 03/08/18 2029 03/09/18 0343  BP: (!) 148/82 (!) 151/75 (!) 148/96 (!) 173/78  Pulse: (!) 107 (!) 109 82 88  Resp: 20 19 19 18   Temp: 98.3 F (36.8 C) 98.3 F (36.8 C) 98.8 F (37.1 C) 98.8 F (37.1 C)  TempSrc: Oral Oral Oral Oral  SpO2: 92% 92% 95% 99%  Weight:      Height:        Intake/Output Summary (Last 24 hours) at 03/09/2018 9518 Last data filed at 03/09/2018 8416 Gross per 24 hour  Intake 225 ml  Output 400 ml  Net -175 ml   Filed Weights   03/07/18 1847  Weight: 79.4 kg (175 lb)     REVIEW OF SYSTEMS  As per history otherwise all reviewed and reported negative  Exam:  General exam: thin, elderly male, hard of hearing, NAD, cooperative, oriented x 3. Dry mucus membranes.  Respiratory system: Clear. No increased work of breathing. Cardiovascular system: S1 & S2 heard.  Gastrointestinal system: Abdomen is nondistended, soft and nontender. Normal bowel sounds heard. Central nervous system: Alert and oriented. No focal neurological deficits. Extremities: no CCE.  Data Reviewed: Basic Metabolic Panel: Recent Labs  Lab 03/07/18 2020  03/08/18 0415  NA 134* 134*  K 3.9 3.4*  CL 97* 98*  CO2 24 25  GLUCOSE 161* 113*  BUN 57* 46*  CREATININE 2.03* 1.33*  CALCIUM 8.6* 8.4*   Liver Function Tests: Recent Labs  Lab 03/07/18 2020  AST 25  ALT 16*  ALKPHOS 57  BILITOT 0.5  PROT 6.3*  ALBUMIN 3.4*   No results for input(s): LIPASE, AMYLASE in the last 168  hours. No results for input(s): AMMONIA in the last 168 hours. CBC: Recent Labs  Lab 03/07/18 2020 03/08/18 0415  WBC 11.3* 9.5  NEUTROABS 9.1*  --   HGB 12.1* 11.5*  HCT 37.0* 34.6*  MCV 94.1 93.3  PLT 270 246   Cardiac Enzymes: Recent Labs  Lab 03/07/18 2020  TROPONINI 0.20*   CBG (last 3)  Recent Labs    03/08/18 1630 03/08/18 2126 03/09/18 0721  GLUCAP 132* 141* 120*   Recent Results (from the past 240 hour(s))  Culture, blood (Routine X 2) w Reflex to ID Panel     Status: None (Preliminary result)   Collection Time: 03/07/18 10:21 PM  Result Value Ref Range Status   Specimen Description LEFT ANTECUBITAL  Final   Special Requests   Final    BOTTLES DRAWN AEROBIC AND ANAEROBIC Blood Culture adequate volume   Culture   Final    NO GROWTH 2 DAYS Performed at [facility], [address], [place], [place] [address]    Report Status PENDING  Incomplete  Culture, blood (Routine X 2) w Reflex to ID Panel     Status: None (Preliminary result)   Collection Time: 03/07/18 10:24 PM  Result Value Ref Range Status   Specimen Description BLOOD RIGHT WRIST  Final   Special Requests   Final    BOTTLES DRAWN AEROBIC AND ANAEROBIC Blood Culture adequate volume   Culture   Final    NO GROWTH 2 DAYS Performed at [facility], [address] [place], [place] [address]    Report Status PENDING  Incomplete     Studies: Dg Chest 2 View  Result Date: 03/07/2018 CLINICAL DATA:  84 y/o  M; cough and shortness of breath. EXAM: CHEST - 2 VIEW COMPARISON:  08/10/2016 chest radiograph FINDINGS: Stable cardiomegaly given projection technique. Calcific aortic atherosclerosis. Elevated left hemidiaphragm and left basilar opacity. Trace right pleural effusion. No acute osseous abnormality. IMPRESSION: Elevated left hemidiaphragm and left basilar opacity which may represent associated atelectasis or pneumonia. Electronically Signed   By: Kristine Garbe M.D.   On: 03/07/2018 20:27    Scheduled Meds: . allopurinol  100 mg Oral Daily  . amLODipine  5 mg Oral Daily  . dextromethorphan-guaiFENesin  1 tablet Oral BID  . finasteride  5 mg Oral Daily  . fluticasone  2 spray Each Nare Daily  . heparin  5,000 Units Subcutaneous Q8H  . insulin aspart  0-5 Units Subcutaneous QHS  . insulin aspart  0-9 Units Subcutaneous TID WC   Continuous Infusions: . azithromycin Stopped (03/08/18 2152)  . cefTRIAXone (ROCEPHIN)  IV Stopped (03/08/18 2237)    Principal Problem:   Acute metabolic encephalopathy Active Problems:   Pneumonia   AKI (acute kidney injury) ([place])   History of progressive weakness   Acute hypoxemic respiratory failure (HCC)   Encephalopathy acute   Acute renal failure (ARF) ([place])   Palliative care by specialist   Goals of care, counseling/discussion   Encounter for hospice care discussion   Time spent:   [employer],  MD, FAAFP [facility] Pager [phone]  If 7PM-7AM, please contact night-coverage [email or web address] Password [facility] 03/09/2018, 8:37 AM    LOS: 1 day

## 2018-03-09 NOTE — Progress Notes (Signed)
Daily Progress Note   Patient Name: Christian Thomas       Date: 03/09/2018 DOB: 06-10-1915  Age: 84 y.o. MRN#: [id number] Attending Physician: Murlean Iba, MD Primary Care Physician: Christian Kirschner, MD Admit Date: 03/07/2018  Reason for Consultation/Follow-up: Establishing goals of care, Hospice Evaluation and Psychosocial/spiritual support  Subjective: Christian Thomas is resting quietly in bed.  He greets me making and keeping eye contact.  He is calm and cooperative, not fearful.  He is oriented to person and place.  He looks improved from yesterday, but continues to have a wet voice quality.  Present today at bedside is niece/HC POA, [name].  We talked about Christian Thomas improvement, another 24 hours for outcomes.  Christian Thomas asks for food and drink.  I share that speech therapy will have an evaluation of him later today, and give him the safest diet available.  [name] agrees.  I encouraged him that we are doing the best we can to care for him, he is doing the best he can also.  He states, "it is in [employer]", and agree.   [name] make him comfortable with warm blankets.  We will have a follow-up again tomorrow.  [name] continues to be agreeable to return Christian Thomas to [facility] when stable with the benefits of hospice.  No further questions at this time.  Length of Stay: 1  Current Medications: Scheduled Meds:  . allopurinol  100 mg Oral Daily  . amLODipine  10 mg Oral Daily  . finasteride  5 mg Oral Daily  . fluticasone  2 spray Each Nare Daily  . heparin  5,000 Units Subcutaneous Q8H    Continuous Infusions: . sodium chloride 60 mL/hr at 03/09/18 1129  . azithromycin Stopped (03/08/18 2152)  . cefTRIAXone (ROCEPHIN)  IV Stopped (03/08/18 2237)    PRN Meds: acetaminophen  **OR** acetaminophen, calcium carbonate, dextromethorphan-guaiFENesin, hydrALAZINE, ipratropium-albuterol, ondansetron **OR** ondansetron (ZOFRAN) IV  Physical Exam  Constitutional:  Makes and keeps eye contact, appears frail, acutely/chronically ill  HENT:  Head: Atraumatic.  Temporal wasting  Cardiovascular: Normal rate.  Pulmonary/Chest: Effort normal. No respiratory distress. He has wheezes.  Abdominal: Soft. He exhibits no distension.  Musculoskeletal: He exhibits no edema.  Muscle wasting  Neurological: He is alert.  Oriented to person and place, questionable about  time.  Skin: Skin is warm and dry.  Nursing note and vitals reviewed.           Vital Signs: BP (!) 173/78 (BP Location: Left Arm)   Pulse 88   Temp 98.8 F (37.1 C) (Oral)   Resp 18   Ht 6\' 2"  (1.88 m)   Wt 79.4 kg (175 lb)   SpO2 99%   BMI 22.47 kg/m  SpO2: SpO2: 99 % O2 Device: O2 Device: Nasal Cannula O2 Flow Rate: O2 Flow Rate (L/min): 4 L/min  Intake/output summary:   Intake/Output Summary (Last 24 hours) at 03/09/2018 1250 Last data filed at 03/09/2018 7425 Gross per 24 hour  Intake 225 ml  Output 400 ml  Net -175 ml   LBM: Last BM Date: 03/07/18 Baseline Weight: Weight: 79.4 kg (175 lb) Most recent weight: Weight: 79.4 kg (175 lb)       Palliative Assessment/Data:    Flowsheet Rows     Most Recent Value  Intake Tab  Referral Department  Hospitalist  Unit at Time of Referral  Med/Surg Unit  Palliative Care Primary Diagnosis  Pulmonary  Date Notified  03/07/18  Palliative Care Type  New Palliative care  Reason for referral  Psychosocial or Spiritual support, Clarify Goals of Care  Date of Admission  03/07/18  Date first seen by Palliative Care  03/08/18  # of days Palliative referral response time  1 Day(s)  # of days IP prior to Palliative referral  0  Clinical Assessment  Palliative Performance Scale Score  30%  Pain Max last 24 hours  Not able to report  Pain Min Last 24 hours   Not able to report  Dyspnea Max Last 24 Hours  Not able to report  Dyspnea Min Last 24 hours  Not able to report  Psychosocial & Spiritual Assessment  Palliative Care Outcomes  Patient/Family meeting held?  Yes  Who was at the meeting?  Niece [name]  Palliative Care Outcomes  Clarified goals of care, Provided psychosocial or spiritual support  Patient/Family wishes: Interventions discontinued/not started   Mechanical Ventilation      Patient Active Problem List   Diagnosis Date Noted  . Acute renal failure (ARF) ([place]) 03/08/2018  . Palliative care by specialist   . Goals of care, counseling/discussion   . Encounter for hospice care discussion   . Acute metabolic encephalopathy 95/63/8756  . Pneumonia 03/07/2018  . AKI (acute kidney injury) ([place]) 03/07/2018  . History of progressive weakness 03/07/2018  . Acute hypoxemic respiratory failure ([place]) 03/07/2018  . Encephalopathy acute 03/07/2018  . Hypoxia 08/10/2016  . COPD exacerbation ([place]) with hypoxia and wheezy bronchitis 08/10/2016  . Asthma with acute exacerbation 03/03/2016  . Degenerative arthritis of knee, bilateral 12/10/2014  . GI bleeding 12/14/2013  . Accelerated hypertension 12/14/2013  . Anemia due to blood loss, acute 12/14/2013  . Dizziness and giddiness 08/20/2013  . Cerebellar stroke syndrome 08/20/2013  . Hereditary and idiopathic peripheral neuropathy 06/15/2013  . Prostate hypertrophy 06/15/2013  . DM2 (diabetes mellitus, type 2) ([place]) 11/05/2012  . Femoral neck fracture ([place]) 11/05/2012  . Constipation 11/05/2012  . Syncope 07/07/2011  . Abnormal ECG 07/07/2011  . Essential hypertension, benign 07/07/2011    Palliative Care Assessment & Plan   Patient Profile: 84 y.o. male  with past medical history of BPH, type 2 diabetes, high blood pressure and cholesterol, history of gout, diverticulosis, history of CDF infection, cerebellar stroke syndrome, benign essential tremor, admitted on 03/07/2018 with  acute metabolic encephalopathy multifactorial related to pneumonia and acute kidney injury.   Assessment: encephalopathy multifactorial related to pneumonia and acute kidney injury: Improved mental state today.  Pneumonia being treated, but questionable aspiration pneumonia, ST eval requested.  Electrolyte imbalance improving.  Recommendations/Plan:  Continue to treat the treatable but no CPR, no intubation, no PEG tube  Return to [facility] with the benefits of hospice and palliative care of [place], when stable.  Goals of Care and Additional Recommendations:  Limitations on Scope of Treatment: Treat the treatable but no CPR, no intubation, no PEG tube  Code Status:    Code Status Orders  (From admission, onward)        Start     Ordered   03/07/18 2302  Do not attempt resuscitation (DNR)  Continuous    Question Answer Comment  In the event of cardiac or respiratory ARREST Do not call a "code blue"   In the event of cardiac or respiratory ARREST Do not perform Intubation, CPR, defibrillation or ACLS   In the event of cardiac or respiratory ARREST Use medication by any route, position, wound care, and other measures to relive pain and suffering. May use oxygen, suction and manual treatment of airway obstruction as needed for comfort.      03/07/18 2304    Code Status History    Date Active Date Inactive Code Status Order ID Comments User Context   03/07/2018 1957 03/07/2018 2304 DNR [id number]  Orlie Dakin, MD ED   09/17/2017 0953 09/17/2017 1818 DNR [id number]  Fredia Sorrow, MD ED   08/10/2016 1755 08/12/2016 1816 Full Code [id number]  Roxan Hockey, MD ED   08/10/2016 1717 08/10/2016 1755 DNR [id number]  Roxan Hockey, MD ED   12/14/2013 2140 12/17/2013 1907 DNR [id number]  Bonnielee Haff, MD Inpatient   11/05/2012 2151 11/09/2012 1903 DNR [id number]  Parke Simmers, RN Inpatient    Advance Directive Documentation     Most Recent Value  Type of Advance Directive   Out of facility DNR (pink MOST or yellow form)  Pre-existing out of facility DNR order (yellow form or pink MOST form)  -  "MOST" Form in Place?  -       Prognosis:   Unable to determine, based on outcomes.  6 months or less would not be surprising if Christian Thomas is now experiencing aspiration pneumonia.  Discharge Planning:  Return to [facility] with the benefits of hospice.  To be set in place by [facility].  Care plan was discussed with nursing staff, case management, social worker, and Dr. Wynetta Emery.  Thank you for allowing the Palliative Medicine Team to assist in the care of this patient.   Time In: 1245 Time Out: 1320 Total Time  40 minutes Prolonged Time Billed  no       Greater than 50%  of this time was spent counseling and coordinating care related to the above assessment and plan.  Drue Novel, NP  Please contact Palliative Medicine Team phone at [phone] for questions and concerns.

## 2018-03-09 NOTE — Evaluation (Signed)
Clinical/Bedside Swallow Evaluation Patient Details  Name: Christian Thomas MRN: [id number] Date of Birth: Mar 16, 1915  Today's Date: 03/09/2018 Time: SLP Start Time (ACUTE ONLY): 57 SLP Stop Time (ACUTE ONLY): 1603 SLP Time Calculation (min) (ACUTE ONLY): 26 min  Past Medical History:  Past Medical History:  Diagnosis Date  . Benign essential tremor   . BPH (benign prostatic hypertrophy)   . Cerebellar stroke syndrome   . CHF (congestive heart failure) ([place])   . Chronic constipation   . Clostridium difficile colitis 12/2008  . DDD (degenerative disc disease), lumbar   . Diabetic neuropathy ([place])   . Diverticulosis   . Dysrhythmia   . Essential hypertension, benign   . Gout   . Macular degeneration   . Mixed hyperlipidemia   . Stroke ([place])   . Type 2 diabetes mellitus ([place])   . Venous insufficiency    Past Surgical History:  Past Surgical History:  Procedure Laterality Date  . APPENDECTOMY    . COLONOSCOPY     NL TI, pan-TICS, sml IH, CDIFF COLITIS  . COLONOSCOPY N/A 12/15/2013   Procedure: Colonoscopy and possible EGD;  Surgeon: Rogene Houston, MD;  Location: [facility];  Service: Endoscopy;  Laterality: N/A;  . HEMORRHOID SURGERY    . HERNIA REPAIR    . HIP ARTHROPLASTY  11/07/2012   Procedure: ARTHROPLASTY BIPOLAR HIP;  Surgeon: Mauri Pole, MD;  Location: [facility];  Service: Orthopedics;  Laterality: Right;  . SIGMOIDOSCOPY     Internal hemorrhoids   HPI:  84 y.o. male with medical history significant for progressive weakness, hypertension, dyslipidemia, BPH, and type 2 diabetes who presents from his assisted living facility after he was noted to be unarousable despite sternal rub.  He apparently fell asleep earlier this evening at approximately 6 PM.  He has been dealing with progressive generalized weakness with recurrent falls.  Patient is otherwise a difficult historian and very hard of hearing. CXR on 03/07/18 indicated Elevated left hemidiaphragm and left  basilar opacity which may represent associated atelectasis or pneumonia.   Assessment / Plan / Recommendation Clinical Impression   Pt with overt s/s of aspiration including delayed throat clearing/cough after entire PO intake during BSE; difficult to assess which consistency triggered the delayed throat clearing/coughing during assessment; suspect delay in the initiation of the swallow (which is [facility] for age d/t presbyphagia) paired with weakened state and SOB, swallowing/breathing reciprocity also plays a role in mild-moderate risk for aspiration; pt has no prior known history of dysphagia per family/pt; recommend Dysphagia 1/thin liquid conservative diet with general swallowing precautions of small bites/sips during intake and if s/s of aspiration increase during meal consumption, NPO status may be warranted with ice chips for comfort until an objective assessment can be completed; ST will f/u while in acute setting for diet tolerance/possibility of objective testing prn. SLP Visit Diagnosis: Dysphagia, oropharyngeal phase (R13.12)    Aspiration Risk  Moderate aspiration risk    Diet Recommendation   Dysphagia 1/thin liquids (via small sips)  Medication Administration: Whole meds with puree    Other  Recommendations Oral Care Recommendations: Oral care QID   Follow up Recommendations Other (comment)(TBD)      Frequency and Duration min 2x/week  1 week       Prognosis Prognosis for Safe Diet Advancement: Good      Swallow Study   General Date of Onset: 03/07/18 HPI: 84 y.o. male with medical history significant for progressive weakness, hypertension, dyslipidemia, BPH, and type  2 diabetes who presents from his assisted living facility after he was noted to be unarousable despite sternal rub.  He apparently fell asleep earlier this evening at approximately 6 PM.  He has been dealing with progressive generalized weakness with recurrent falls.  Patient is otherwise a difficult  historian and very hard of hearing. Type of Study: Bedside Swallow Evaluation Previous Swallow Assessment: n/a Diet Prior to this Study: NPO Temperature Spikes Noted: No Respiratory Status: Nasal cannula History of Recent Intubation: No Behavior/Cognition: Alert;Cooperative;Pleasant mood;Other (Comment)(HOH) Oral Cavity Assessment: Dry Oral Care Completed by SLP: Recent completion by staff Oral Cavity - Dentition: Adequate natural dentition Self-Feeding Abilities: Needs assist Patient Positioning: Upright in bed Baseline Vocal Quality: Low vocal intensity Volitional Cough: Strong Volitional Swallow: Able to elicit    Oral/Motor/Sensory Function Overall Oral Motor/Sensory Function: Generalized oral weakness   Ice Chips Ice chips: Within functional limits Presentation: Spoon   Thin Liquid Thin Liquid: Impaired Presentation: Cup;Spoon Pharyngeal  Phase Impairments: Suspected delayed Swallow;Multiple swallows;Throat Clearing - Delayed;Cough - Delayed    Nectar Thick Nectar Thick Liquid: Not tested   Honey Thick Honey Thick Liquid: Not tested   Puree Puree: Impaired Presentation: Spoon Pharyngeal Phase Impairments: Suspected delayed Swallow;Throat Clearing - Delayed   Solid      Solid: Impaired Presentation: Spoon Pharyngeal Phase Impairments: Throat Clearing - Delayed        Elvina Sidle, M.S., CCC-SLP 03/09/2018,6:59 PM

## 2018-03-10 ENCOUNTER — Telehealth: Payer: Self-pay | Admitting: Family Medicine

## 2018-03-10 DIAGNOSIS — E43 Unspecified severe protein-calorie malnutrition: Secondary | ICD-10-CM

## 2018-03-10 DIAGNOSIS — J189 Pneumonia, unspecified organism: Principal | ICD-10-CM

## 2018-03-10 LAB — BASIC METABOLIC PANEL
ANION GAP: 20 — AB (ref 5–15)
BUN: 34 mg/dL — ABNORMAL HIGH (ref 6–20)
CALCIUM: 9.2 mg/dL (ref 8.9–10.3)
CO2: 24 mmol/L (ref 22–32)
CREATININE: 0.93 mg/dL (ref 0.61–1.24)
Chloride: 100 mmol/L — ABNORMAL LOW (ref 101–111)
Glucose, Bld: 166 mg/dL — ABNORMAL HIGH (ref 65–99)
Potassium: 3.2 mmol/L — ABNORMAL LOW (ref 3.5–5.1)
SODIUM: 144 mmol/L (ref 135–145)

## 2018-03-10 LAB — GLUCOSE, CAPILLARY
GLUCOSE-CAPILLARY: 155 mg/dL — AB (ref 65–99)
Glucose-Capillary: 160 mg/dL — ABNORMAL HIGH (ref 65–99)
Glucose-Capillary: 210 mg/dL — ABNORMAL HIGH (ref 65–99)

## 2018-03-10 MED ORDER — METOPROLOL TARTRATE 25 MG PO TABS: 12.5000 mg | ORAL_TABLET | Freq: Two times a day (BID) | ORAL | Status: AC

## 2018-03-10 MED ORDER — DOXYCYCLINE HYCLATE 100 MG PO CAPS: 100.0000 mg | ORAL_CAPSULE | Freq: Two times a day (BID) | ORAL | 0 refills | Status: AC

## 2018-03-10 MED ORDER — METOPROLOL TARTRATE 5 MG/5ML IV SOLN
5.0000 mg | Freq: Once | INTRAVENOUS | Status: AC
  Administered 2018-03-10: 5 mg via INTRAVENOUS
  Filled 2018-03-10: qty 5

## 2018-03-10 MED ORDER — POLYETHYLENE GLYCOL 3350 17 GM/SCOOP PO POWD: 17.0000 g | Freq: Every day | ORAL | 0 refills | Status: DC | PRN

## 2018-03-10 MED ORDER — TRAMADOL HCL 50 MG PO TABS: 50.0000 mg | ORAL_TABLET | Freq: Four times a day (QID) | ORAL | 0 refills | Status: DC | PRN

## 2018-03-10 MED ORDER — METOPROLOL TARTRATE 25 MG PO TABS
12.5000 mg | ORAL_TABLET | Freq: Two times a day (BID) | ORAL | Status: DC
  Administered 2018-03-10: 12.5 mg via ORAL
  Filled 2018-03-10: qty 1

## 2018-03-10 MED ORDER — ENALAPRIL MALEATE 5 MG PO TABS: 5.0000 mg | ORAL_TABLET | Freq: Every day | ORAL | Status: AC

## 2018-03-10 MED ORDER — HYDROXYZINE HCL 25 MG PO TABS: 25.0000 mg | ORAL_TABLET | Freq: Three times a day (TID) | ORAL | 10 refills | Status: AC | PRN

## 2018-03-10 MED ORDER — OLOPATADINE HCL 0.2 % OP SOLN: 1.0000 [drp] | Freq: Every day | OPHTHALMIC | Status: AC

## 2018-03-10 MED ORDER — POTASSIUM CHLORIDE 20 MEQ/15ML (10%) PO SOLN
40.0000 meq | Freq: Once | ORAL | Status: AC
  Administered 2018-03-10: 40 meq via ORAL
  Filled 2018-03-10: qty 30

## 2018-03-10 MED ORDER — ALPRAZOLAM 0.25 MG PO TABS: 0.2500 mg | ORAL_TABLET | Freq: Two times a day (BID) | ORAL | 0 refills | Status: DC | PRN

## 2018-03-10 NOTE — Telephone Encounter (Signed)
Patient is being sent back to [facility] today from the hospital today.  They are needing an order needing an order for palliative care and a hospital bed.  They are needing this ordered quickly and sent to [facility].  Christian Thomas

## 2018-03-10 NOTE — Discharge Instructions (Signed)
Pt would benefit from hospital bed.  Please consult palliative medicine services of [facility] Please monitor blood sugars 3 times per day and as needed if not feeling well.    Follow with Primary MD  Mikey Kirschner, MD  and other consultant's as instructed your Hospitalist MD  Please get a complete blood count and chemistry panel checked by your Primary MD at your next visit, and again as instructed by your Primary MD.  Get Medicines reviewed and adjusted: Please take all your medications with you for your next visit with your Primary MD  Laboratory/radiological data: Please request your Primary MD to go over all hospital tests and procedure/radiological results at the follow up, please ask your Primary MD to get all Hospital records sent to his/her office.  In some cases, they will be blood work, cultures and biopsy results pending at the time of your discharge. Please request that your primary care M.D. follows up on these results.  Also Note the following: If you experience worsening of your admission symptoms, develop shortness of breath, life threatening emergency, suicidal or homicidal thoughts you must seek medical attention immediately by calling 911 or calling your MD immediately  if symptoms less severe.  You must read complete instructions/literature along with all the possible adverse reactions/side effects for all the Medicines you take and that have been prescribed to you. Take any new Medicines after you have completely understood and accpet all the possible adverse reactions/side effects.   Do not drive when taking Pain medications or sleeping medications (Benzodaizepines)  Do not take more than prescribed Pain, Sleep and Anxiety Medications. It is not advisable to combine anxiety,sleep and pain medications without talking with your primary care practitioner  Special Instructions: If you have smoked or chewed Tobacco  in the last 2 yrs please stop smoking, stop any  regular Alcohol  and or any Recreational drug use.  Wear Seat belts while driving.  Please note: You were cared for by a hospitalist during your hospital stay. Once you are discharged, your primary care physician will handle any further medical issues. Please note that NO REFILLS for any discharge medications will be authorized once you are discharged, as it is imperative that you return to your primary care physician (or establish a relationship with a primary care physician if you do not have one) for your post hospital discharge needs so that they can reassess your need for medications and monitor your lab values.     Community-Acquired Pneumonia, Adult Pneumonia is an infection of the lungs. There are different types of pneumonia. One type can develop while a person is in a hospital. A different type, called community-acquired pneumonia, develops in people who are not, or have not recently been, in the hospital or other health care facility. What are the causes? Pneumonia may be caused by bacteria, viruses, or funguses. Community-acquired pneumonia is often caused by Streptococcus pneumonia bacteria. These bacteria are often passed from one person to another by breathing in droplets from the cough or sneeze of an infected person. What increases the risk? The condition is more likely to develop in:  People who havechronic diseases, such as chronic obstructive pulmonary disease (COPD), asthma, congestive heart failure, cystic fibrosis, diabetes, or kidney disease.  People who haveearly-stage or late-stage HIV.  People who havesickle cell disease.  People who havehad their spleen removed (splenectomy).  People who havepoor Human resources officer.  People who havemedical conditions that increase the risk of breathing in (aspirating) secretions their own  mouth and nose.  People who havea weakened immune system (immunocompromised).  People who smoke.  People whotravel to areas where  pneumonia-causing germs commonly exist.  People whoare around animal habitats or animals that have pneumonia-causing germs, including birds, bats, rabbits, cats, and farm animals.  What are the signs or symptoms? Symptoms of this condition include:  Adry cough.  A wet (productive) cough.  Fever.  Sweating.  Chest pain, especially when breathing deeply or coughing.  Rapid breathing or difficulty breathing.  Shortness of breath.  Shaking chills.  Fatigue.  Muscle aches.  How is this diagnosed? Your health care provider will take a medical history and perform a physical exam. You may also have other tests, including:  Imaging studies of your chest, including X-rays.  Tests to check your blood oxygen level and other blood gases.  Other tests on blood, mucus (sputum), fluid around your lungs (pleural fluid), and urine.  If your pneumonia is severe, other tests may be done to identify the specific cause of your illness. How is this treated? The type of treatment that you receive depends on many factors, such as the cause of your pneumonia, the medicines you take, and other medical conditions that you have. For most adults, treatment and recovery from pneumonia may occur at home. In some cases, treatment must happen in a hospital. Treatment may include:  Antibiotic medicines, if the pneumonia was caused by bacteria.  Antiviral medicines, if the pneumonia was caused by a virus.  Medicines that are given by mouth or through an IV tube.  Oxygen.  Respiratory therapy.  Although rare, treating severe pneumonia may include:  Mechanical ventilation. This is done if you are not breathing well on your own and you cannot maintain a safe blood oxygen level.  Thoracentesis. This procedureremoves fluid around one lung or both lungs to help you breathe better.  Follow these instructions at home:  Take over-the-counter and prescription medicines only as told by your health care  provider. ? Only takecough medicine if you are losing sleep. Understand that cough medicine can prevent your bodys natural ability to remove mucus from your lungs. ? If you were prescribed an antibiotic medicine, take it as told by your health care provider. Do not stop taking the antibiotic even if you start to feel better.  Sleep in a semi-upright position at night. Try sleeping in a reclining chair, or place a few pillows under your head.  Do not use tobacco products, including cigarettes, chewing tobacco, and e-cigarettes. If you need help quitting, ask your health care provider.  Drink enough water to keep your urine clear or pale yellow. This will help to thin out mucus secretions in your lungs. How is this prevented? There are ways that you can decrease your risk of developing community-acquired pneumonia. Consider getting a pneumococcal vaccine if:  You are older than 82 years of age.  You are older than 82 years of age and are undergoing cancer treatment, have chronic lung disease, or have other medical conditions that affect your immune system. Ask your health care provider if this applies to you.  There are different types and schedules of pneumococcal vaccines. Ask your health care provider which vaccination option is best for you. You may also prevent community-acquired pneumonia if you take these actions:  Get an influenza vaccine every year. Ask your health care provider which type of influenza vaccine is best for you.  Go to the dentist on a regular basis.  Wash your hands  often. Use hand sanitizer if soap and water are not available.  Contact a health care provider if:  You have a fever.  You are losing sleep because you cannot control your cough with cough medicine. Get help right away if:  You have worsening shortness of breath.  You have increased chest pain.  Your sickness becomes worse, especially if you are an older adult or have a weakened immune  system.  You cough up blood. This information is not intended to replace advice given to you by your health care provider. Make sure you discuss any questions you have with your health care provider. Document Released: 11/01/2005 Document Revised: 03/11/2016 Document Reviewed: 02/26/2015 Elsevier Interactive Patient Education  [employer].

## 2018-03-10 NOTE — Care Management Note (Signed)
Case Management Note  Patient Details  Name: Christian Thomas MRN: [id number] Date of Birth: [birth year]  Subjective/Objective:         Admitted with acute encephelopathy secondary to pneumonia. Pt from [facility].            Action/Plan: DC back to ALF today with resumption of HH services through [facility], Judson Roch, [facility] rep, aware of DC and will pull resumption order from chart. Facility will make referral to Hospice and Palliative of , per facility preference, for palliative services as pt's family wants him to cont [facility] PT. He will need hospital bed. Referral started by [facility] through [facility]. CM has provided documentation. Bed will be delivered prior to pts return. Pt does not qualify for home Oxygen. He may have qualified if he had been able to ambulate. CM discussed this with Olivia Mackie, ALF rep, and she is aware he may need oxygen in the future if he is able to ambulate at facility. CSW making arrangements for return to facility.   Expected Discharge Date:  03/10/18               Expected Discharge Plan:  Assisted Living / Rest Home  In-House Referral:  Clinical Social Work, Hospice / Palliative Care  Discharge planning Services  CM Consult  Post Acute Care Choice:  Durable Medical Equipment, Home Health Choice offered to:  NA  DME Arranged:  Hospital bed DME Agency:  [place] Arranged:  PT, RN [facility] Agency:  [facility]  Status of Service:  Completed, signed off   Sherald Barge, RN 03/10/2018, 12:45 PM

## 2018-03-10 NOTE — Telephone Encounter (Signed)
Order written; awaiting signature.

## 2018-03-10 NOTE — Telephone Encounter (Signed)
Rip Harbour, nurse from [place], contacted office to clarify a few questions. 1) The hospital placed pt on Alprazolam 0.25 Bid prn. Per Dr. Nicki Reaper 0.25 Alprazolam BID PRN for anxiousness/sleep. 2)Order to still check glucose 2x week. Per Dr. Nicki Reaper, continue to check BG twice weekly on weekdays. 3)The Dextromethrophan (Mucinex) comes in a 3 pack; still using the same dosing it is just packaged different. Per Dr. Nicki Reaper, that is fine. 4) hospital had pt on low sodium heart healthy diet; Per Dr. Nicki Reaper, that seems reasonable. Rip Harbour will also fax over order conformation sheet.

## 2018-03-10 NOTE — NC FL2 (Signed)
[place] LEVEL OF CARE SCREENING TOOL     IDENTIFICATION  Patient Name: Christian Thomas Birthdate: [birth year] Sex: male Admission Date (Current Location): 03/07/2018  [facility] and [place] Number:  [employer] and Address:  [facility] [address], [place]      Provider Number: [id number]  Attending Physician Name and Address:  Murlean Iba, MD  Relative Name and Phone Number:       Current Level of Care: Hospital Recommended Level of Care: [place] Prior Approval Number:    Date Approved/Denied:   PASRR Number:    Discharge Plan: Other (Comment)([facility])    Current Diagnoses: Patient Active Problem List   Diagnosis Date Noted  . Protein-calorie malnutrition, severe 03/09/2018  . Acute renal failure (ARF) ([place]) 03/08/2018  . Palliative care by specialist   . Goals of care, counseling/discussion   . Encounter for hospice care discussion   . Acute metabolic encephalopathy 45/40/9811  . Community acquired pneumonia 03/07/2018  . AKI (acute kidney injury) ([place]) 03/07/2018  . History of progressive weakness 03/07/2018  . Acute hypoxemic respiratory failure ([place]) 03/07/2018  . Encephalopathy acute 03/07/2018  . Hypoxia 08/10/2016  . COPD exacerbation ([place]) with hypoxia and wheezy bronchitis 08/10/2016  . Asthma with acute exacerbation 03/03/2016  . Degenerative arthritis of knee, bilateral 12/10/2014  . GI bleeding 12/14/2013  . Accelerated hypertension 12/14/2013  . Anemia due to blood loss, acute 12/14/2013  . Dizziness and giddiness 08/20/2013  . Cerebellar stroke syndrome 08/20/2013  . Hereditary and idiopathic peripheral neuropathy 06/15/2013  . Prostate hypertrophy 06/15/2013  . DM2 (diabetes mellitus, type 2) ([place]) 11/05/2012  . Femoral neck fracture ([place]) 11/05/2012  . Constipation 11/05/2012  . Syncope 07/07/2011  . Abnormal ECG 07/07/2011  . Essential hypertension, benign  07/07/2011    Orientation RESPIRATION BLADDER Height & Weight     Self  Normal Incontinent Weight: 153 lb 3.5 oz (69.5 kg) Height:  6\' 2"  (188 cm)  BEHAVIORAL SYMPTOMS/MOOD NEUROLOGICAL BOWEL NUTRITION STATUS      Incontinent Diet(low sodium/heart healthy)  AMBULATORY STATUS COMMUNICATION OF NEEDS Skin   Extensive Assist(patient mainly uses a wheelchair ) Verbally Normal                       Personal Care Assistance Level of Assistance  Bathing, Feeding, Dressing Bathing Assistance: Limited assistance Feeding assistance: Independent       Functional Limitations Info  Sight, Hearing, Speech Sight Info: Adequate Hearing Info: Impaired(HOH ) Speech Info: Adequate    SPECIAL CARE FACTORS FREQUENCY                       Contractures Contractures Info: Not present    Additional Factors Info  Code Status, Allergies, Psychotropic Code Status Info: DNR Allergies Info: Morphine and Related, Opium Psychotropic Info: Xanax, Desyrel         Current Medications (03/10/2018):  This is the current hospital active medication list Current Facility-Administered Medications  Medication Dose Route Frequency Provider Last Rate Last Dose  . acetaminophen (TYLENOL) tablet 650 mg  650 mg Oral Q6H PRN Heath Lark D, DO   650 mg at 03/10/18 1119   Or  . acetaminophen (TYLENOL) suppository 650 mg  650 mg Rectal Q6H PRN Heath Lark D, DO   650 mg at 03/09/18 9147  . allopurinol (ZYLOPRIM) tablet 100 mg  100 mg Oral Daily Manuella Ghazi, Pratik D, DO  100 mg at 03/10/18 1119  . amLODipine (NORVASC) tablet 10 mg  10 mg Oral Daily Johnson, Clanford L, MD   10 mg at 03/10/18 1119  . azithromycin (ZITHROMAX) 250 mg in dextrose 5 % 125 mL IVPB  250 mg Intravenous Q24H Manuella Ghazi, Pratik D, DO   Stopped at 03/09/18 2208  . calcium carbonate (TUMS - dosed in mg elemental calcium) chewable tablet 400 mg of elemental calcium  2 tablet Oral Q6H PRN Manuella Ghazi, Pratik D, DO      . cefTRIAXone (ROCEPHIN) 1 g in  sodium chloride 0.9 % 100 mL IVPB  1 g Intravenous Q24H Murlean Iba, MD   Stopped at 03/09/18 2139  . dextromethorphan-guaiFENesin (MUCINEX DM) 30-600 MG per 12 hr tablet 1 tablet  1 tablet Oral BID PRN Wynetta Emery, Clanford L, MD      . finasteride (PROSCAR) tablet 5 mg  5 mg Oral Daily Manuella Ghazi, Pratik D, DO   5 mg at 03/10/18 1123  . fluticasone (FLONASE) 50 MCG/ACT nasal spray 2 spray  2 spray Each Nare Daily Manuella Ghazi, Pratik D, DO   2 spray at 03/10/18 1119  . heparin injection 5,000 Units  5,000 Units Subcutaneous Q8H Shah, Pratik D, DO   5,000 Units at 03/10/18 0410  . hydrALAZINE (APRESOLINE) injection 10 mg  10 mg Intravenous Q4H PRN Wynetta Emery, Clanford L, MD   10 mg at 03/09/18 2147  . ipratropium-albuterol (DUONEB) 0.5-2.5 (3) MG/3ML nebulizer solution 3 mL  3 mL Nebulization Q6H PRN Manuella Ghazi, Pratik D, DO      . metoprolol tartrate (LOPRESSOR) tablet 12.5 mg  12.5 mg Oral BID Johnson, Clanford L, MD   12.5 mg at 03/10/18 1120  . ondansetron (ZOFRAN) tablet 4 mg  4 mg Oral Q6H PRN Manuella Ghazi, Pratik D, DO       Or  . ondansetron (ZOFRAN) injection 4 mg  4 mg Intravenous Q6H PRN Manuella Ghazi, Pratik D, DO      . potassium chloride 20 MEQ/15ML (10%) solution 40 mEq  40 mEq Oral Once Johnson, Clanford L, MD         Discharge Medications:  Medication List    TAKE these medications   acetaminophen 500 MG tablet Commonly known as:  TYLENOL Take 1,000 mg by mouth 2 (two) times daily. 2 bid   AEROCHAMBER MINI CHAMBER Devi Use as directed   albuterol 108 (90 Base) MCG/ACT inhaler Commonly known as:  PROVENTIL HFA;VENTOLIN HFA Inhale 2 puffs into the lungs 4 (four) times daily as needed for wheezing or shortness of breath.   albuterol (2.5 MG/3ML) 0.083% nebulizer solution Commonly known as:  PROVENTIL Take 3 mLs (2.5 mg total) by nebulization every 4 (four) hours as needed for wheezing or shortness of breath.   allopurinol 300 MG tablet Commonly known as:  ZYLOPRIM TAKE 1 TAB BY MOUTH EVERY DAY  -- DX: GOUT   ALPRAZolam 0.25 MG tablet Commonly known as:  XANAX Take 1 tablet (0.25 mg total) by mouth 2 (two) times daily as needed for anxiety or sleep. Take 1 tablet BID What changed:    how much to take  how to take this  when to take this  reasons to take this   amLODipine 5 MG tablet Commonly known as:  NORVASC Take 5 mg by mouth daily.   BIOFREEZE EX Apply 1 application topically 4 (four) times daily as needed (for pain).   calcium carbonate 500 MG chewable tablet Commonly known as:  TUMS - dosed in mg elemental  calcium Chew 2 tablets by mouth every 6 (six) hours as needed for indigestion or heartburn.   capsaicin 0.025 % cream Commonly known as:  ZOSTRIX Apply 1 application topically 2 (two) times daily as needed (apply to left ankle and feet as needed).   DESITIN 13 % Crea Generic drug:  Zinc Oxide Apply 1 application topically daily as needed (rash).   dextrose 40 % Gel Commonly known as:  GLUTOSE Take 1 Tube by mouth once as needed for low blood sugar (for levels <60).   diphenhydrAMINE 25 MG tablet Commonly known as:  BENADRYL Take 25 mg by mouth every 6 (six) hours as needed for itching.   doxycycline 100 MG capsule Commonly known as:  VIBRAMYCIN Take 1 capsule (100 mg total) by mouth 2 (two) times daily for 5 days.   enalapril 5 MG tablet Commonly known as:  VASOTEC Take 1 tablet (5 mg total) by mouth daily. TAKE (1) TABLET BY MOUTH TWICE DAILY. What changed:    medication strength  how much to take  how to take this  when to take this   finasteride 5 MG tablet Commonly known as:  PROSCAR Take 5 mg by mouth daily.   fluticasone 50 MCG/ACT nasal spray Commonly known as:  FLONASE Place 2 sprays into both nostrils daily.   GENERLAC 10 GM/15ML Soln Generic drug:  lactulose (encephalopathy) TAKE 30 ML BY MOUTH EVERY DAY   glipiZIDE 2.5 MG 24 hr tablet Commonly known as:  GLUCOTROL XL Take 1 tablet (2.5 mg total) by  mouth daily with breakfast.   hydrOXYzine 25 MG tablet Commonly known as:  ATARAX/VISTARIL Take 1 tablet (25 mg total) by mouth every 8 (eight) hours as needed for itching. What changed:    how much to take  how to take this  when to take this  reasons to take this  additional instructions   loratadine 10 MG tablet Commonly known as:  CLARITIN TAKE 1 TABLET BY MOUTH ONCE DAILY FOR ALLERGIC RHINITIS.   lovastatin 40 MG tablet Commonly known as:  MEVACOR TAKE 1 TAB BY MOUTH AT BEDTIME -- DX: CHOLESTEROL What changed:    how much to take  how to take this  when to take this  additional instructions   magic mouthwash Soln Take 15 mLs by mouth 4 (four) times daily as needed for mouth pain.   meclizine 25 MG tablet Commonly known as:  ANTIVERT TAKE 1 TAB BY MOUTH EVERY 6 HOURS AS NEEDED FOR DIZZINESS   metFORMIN 500 MG tablet Commonly known as:  GLUCOPHAGE TAKE 1 TAB BY MOUTH TWICE DAILY What changed:    how much to take  how to take this  when to take this  additional instructions   metoprolol tartrate 25 MG tablet Commonly known as:  LOPRESSOR Take 0.5 tablets (12.5 mg total) by mouth 2 (two) times daily.   neomycin-bacitracin-polymyxin ointment Commonly known as:  NEOSPORIN Apply 1 application topically every 12 (twelve) hours.   nortriptyline 10 MG capsule Commonly known as:  PAMELOR TAKE 1 CAPSULE (10 MG TOTAL) BY MOUTH AT BEDTIME. FOR NEUROPATHIC PAIN   Olopatadine HCl 0.2 % Soln Apply 1 drop to eye daily. One drop each eye daily What changed:    how much to take  how to take this  when to take this   omeprazole 20 MG capsule Commonly known as:  PRILOSEC Take 1 capsule (20 mg total) by mouth 2 (two) times daily before a meal.   polyethylene  glycol powder powder Commonly known as:  GLYCOLAX/MIRALAX Take 17 g by mouth daily as needed for moderate constipation (now bowel movement in 2 days). What changed:    when to take  this  reasons to take this   [place] 200-200-20 MG/5ML suspension Generic drug:  alum & mag hydroxide-simeth Take 30 mLs by mouth every evening.   QC ANTACID/ANTI-GAS 200-200-20 MG/5ML suspension Generic drug:  alum & mag hydroxide-simeth TAKE 2 TABLESPOONFULS (92ml) BY MOUTH EACH EVENING.   senna 8.6 MG Tabs tablet Commonly known as:  SENOKOT TAKE 2 TABS (17.2MG ) BY MOUTH AT BEDTIME   SORE THROAT 1.4 % Liqd Generic drug:  phenol USE 5 SPRAYS IN THROAT EVERY 2 HOURS AS NEEDED FOR SORE THROAT.   traMADol 50 MG tablet Commonly known as:  ULTRAM Take 1 tablet (50 mg total) by mouth every 6 (six) hours as needed for moderate pain. What changed:    when to take this  reasons to take this   traZODone 100 MG tablet Commonly known as:  DESYREL Take 1 tablet (100 mg total) by mouth at bedtime. qhs What changed:  additional instructions       Relevant Imaging Results:  Relevant Lab Results:   Additional Information    Shonta Phillis, Clydene Pugh, LCSW

## 2018-03-10 NOTE — Progress Notes (Signed)
IV discontinued,catheter intact.Discharged to [name]. Patient transported to facility by family.Accompanied by staff to awaiting vehicle.

## 2018-03-10 NOTE — Discharge Summary (Signed)
Physician Discharge Summary  Christian Thomas YQI:[id number] DOB: Jul 13, 1915 DOA: 03/07/2018  PCP: Mikey Kirschner, MD  Admit date: 03/07/2018 Discharge date: 03/10/2018  Admitted From: Home  Disposition: Home   Recommendations for Outpatient Follow-up:  1. Follow up with PCP in 1 weeks.  2. Please consult pallitive medicine services of [facility].  3. Patient would benefit from a hospital bed.  4. Please obtain BMP/CBC in one week.  5. Please follow up on the following pending results: Final Culture Data   Discharge Condition: STABLE   CODE STATUS: DNR    Brief Hospitalization Summary: Please see all hospital notes, images, labs for full details of the hospitalization. From HPI: Christian Thomas is a 84 y.o. male with medical history significant for progressive weakness, hypertension, dyslipidemia, BPH, and type 2 diabetes who presents from his assisted living facility after he was noted to be unarousable despite sternal rub.  He apparently fell asleep earlier this evening at approximately 6 PM.  He has been dealing with progressive generalized weakness with recurrent falls.  Patient is otherwise a difficult historian and very hard of hearing. He states that he has had some chills but no fevers and does have a cough with yellowish sputum noted. He has poor appetite and denies any nausea, vomiting or diarrhea.   ED Course: Vital signs noted to be stable.  Laboratory data remarkable for leukocytosis of 11,300, troponin 0 0.20, hemoglobin 12.1, creatinine 2.03 with baseline near 1, BUN 57, glucose 161, and platelets of 270.  Two-view chest x-ray demonstrates findings with possible left-sided pneumonia.  He is currently on 2 L nasal cannula with oxygen desaturation in the mid 80th percentile on room air.  He does not wear oxygen at home.  Brief Admission Hx:  Christian Andujo Rumleyis a 84 y.o.malewith medical history significant forprogressive weakness, hypertension, dyslipidemia, BPH, and  type 2 diabetes who presents from his assisted living facility after he was noted to be unarousable despite sternal rub. He apparently fell asleep earlier this evening at approximately 6 PM.He has been dealing with progressive generalized weakness with recurrent falls. Patient is otherwise a difficult historian and very hard of hearing.  He states that he has had some chills but no fevers and does have a cough with yellowish sputum noted.  He has poor appetite and denies any nausea, vomiting or diarrhea.  MDM/Assessment & Plan:   1. Acute metabolic encephalopathy-RESOLVED NOW.  multifactorial. I believe that much of this is related to pneumonia as well as AKI. He was treated with IVF and antibiotics and he improved.  Patient is DNR/DNI. 2. Community Acquired Pneumonia. Cultures pending, treated with Rocephin and azithromycin.  Discharge on 5 more days of oral doxycycline.  Follow sputum cultures: no growth to date.  3. Acute hypoxemic respiratory failure likely secondary to above. Continue treatment for pneumonia with incentive spirometry and up to chair. Wean oxygen as tolerated.  He will be discharged on oxygen if he qualifies.  Will assess prior to discharge.   4. Dehydration with AKI. RESOLVED WITH IV FLUID HYDRATION.   5. Diabetes.  Blood sugars stable and controlled. RESUME HOME TREATMENT PROGRAM.  CONTINUE BLOOD SUGAR TESTING 3 TIMES PER DAY.  6. Hypertension. Continue amlodipine, enalapril, added metoprolol.  7. Atrial Fib with RVR - Pt had an episode of Afib RVR overnight that was treated with IV metoprolol and it resolved. He was started on metoprolol 12.5 mg BID, given his advanced age and fall risk, did not start anticoagulation on  him, this was likely precipitated by the pneumonia.  Follow up with his PCP to discuss further.   8. Progressive weakness with ambulatory dysfunction. Fall precautions. PT evaluation pending. 9. Elevated troponin. Likely secondary to AKI. No  chest pain and no acute ST - T abnormalities.  10. Hypokalemia -pt was given supplement prior to discharge, recheck BMP in 1 week.   DVT prophylaxis:Heparin Code Status:DNR/DNI Family Communication:neice Disposition Plan: return to SNF Consults called:None  Discharge Diagnoses:  Principal Problem:   Acute metabolic encephalopathy Active Problems:   Community acquired pneumonia   AKI (acute kidney injury) ([place])   History of progressive weakness   Acute hypoxemic respiratory failure (HCC)   Encephalopathy acute   Acute renal failure (ARF) ([place])   Palliative care by specialist   Goals of care, counseling/discussion   Encounter for hospice care discussion   Protein-calorie malnutrition, severe  Discharge Instructions: Discharge Instructions    Call MD for:  extreme fatigue   Complete by:  As directed    Call MD for:  persistant dizziness or light-headedness   Complete by:  As directed    Call MD for:  redness, tenderness, or signs of infection (pain, swelling, redness, odor or green/yellow discharge around incision site)   Complete by:  As directed    Call MD for:  severe uncontrolled pain   Complete by:  As directed    Diet - low sodium heart healthy   Complete by:  As directed    Increase activity slowly   Complete by:  As directed      Allergies as of 03/10/2018      Reactions   Morphine And Related    Opium       Medication List    TAKE these medications   acetaminophen 500 MG tablet Commonly known as:  TYLENOL Take 1,000 mg by mouth 2 (two) times daily. 2 bid   AEROCHAMBER MINI CHAMBER Devi Use as directed   albuterol 108 (90 Base) MCG/ACT inhaler Commonly known as:  PROVENTIL HFA;VENTOLIN HFA Inhale 2 puffs into the lungs 4 (four) times daily as needed for wheezing or shortness of breath.   albuterol (2.5 MG/3ML) 0.083% nebulizer solution Commonly known as:  PROVENTIL Take 3 mLs (2.5 mg total) by nebulization every 4 (four) hours as needed for  wheezing or shortness of breath.   allopurinol 300 MG tablet Commonly known as:  ZYLOPRIM TAKE 1 TAB BY MOUTH EVERY DAY -- DX: GOUT   ALPRAZolam 0.25 MG tablet Commonly known as:  XANAX Take 1 tablet (0.25 mg total) by mouth 2 (two) times daily as needed for anxiety or sleep. Take 1 tablet BID What changed:    how much to take  how to take this  when to take this  reasons to take this   amLODipine 5 MG tablet Commonly known as:  NORVASC Take 5 mg by mouth daily.   BIOFREEZE EX Apply 1 application topically 4 (four) times daily as needed (for pain).   calcium carbonate 500 MG chewable tablet Commonly known as:  TUMS - dosed in mg elemental calcium Chew 2 tablets by mouth every 6 (six) hours as needed for indigestion or heartburn.   capsaicin 0.025 % cream Commonly known as:  ZOSTRIX Apply 1 application topically 2 (two) times daily as needed (apply to left ankle and feet as needed).   DESITIN 13 % Crea Generic drug:  Zinc Oxide Apply 1 application topically daily as needed (rash).   dextrose 40 %  Gel Commonly known as:  GLUTOSE Take 1 Tube by mouth once as needed for low blood sugar (for levels <60).   diphenhydrAMINE 25 MG tablet Commonly known as:  BENADRYL Take 25 mg by mouth every 6 (six) hours as needed for itching.   doxycycline 100 MG capsule Commonly known as:  VIBRAMYCIN Take 1 capsule (100 mg total) by mouth 2 (two) times daily for 5 days.   enalapril 5 MG tablet Commonly known as:  VASOTEC Take 1 tablet (5 mg total) by mouth daily. TAKE (1) TABLET BY MOUTH TWICE DAILY. What changed:    medication strength  how much to take  how to take this  when to take this   finasteride 5 MG tablet Commonly known as:  PROSCAR Take 5 mg by mouth daily.   fluticasone 50 MCG/ACT nasal spray Commonly known as:  FLONASE Place 2 sprays into both nostrils daily.   GENERLAC 10 GM/15ML Soln Generic drug:  lactulose (encephalopathy) TAKE 30 ML BY MOUTH EVERY  DAY   glipiZIDE 2.5 MG 24 hr tablet Commonly known as:  GLUCOTROL XL Take 1 tablet (2.5 mg total) by mouth daily with breakfast.   hydrOXYzine 25 MG tablet Commonly known as:  ATARAX/VISTARIL Take 1 tablet (25 mg total) by mouth every 8 (eight) hours as needed for itching. What changed:    how much to take  how to take this  when to take this  reasons to take this  additional instructions   loratadine 10 MG tablet Commonly known as:  CLARITIN TAKE 1 TABLET BY MOUTH ONCE DAILY FOR ALLERGIC RHINITIS.   lovastatin 40 MG tablet Commonly known as:  MEVACOR TAKE 1 TAB BY MOUTH AT BEDTIME -- DX: CHOLESTEROL What changed:    how much to take  how to take this  when to take this  additional instructions   magic mouthwash Soln Take 15 mLs by mouth 4 (four) times daily as needed for mouth pain.   meclizine 25 MG tablet Commonly known as:  ANTIVERT TAKE 1 TAB BY MOUTH EVERY 6 HOURS AS NEEDED FOR DIZZINESS   metFORMIN 500 MG tablet Commonly known as:  GLUCOPHAGE TAKE 1 TAB BY MOUTH TWICE DAILY What changed:    how much to take  how to take this  when to take this  additional instructions   metoprolol tartrate 25 MG tablet Commonly known as:  LOPRESSOR Take 0.5 tablets (12.5 mg total) by mouth 2 (two) times daily.   neomycin-bacitracin-polymyxin ointment Commonly known as:  NEOSPORIN Apply 1 application topically every 12 (twelve) hours.   nortriptyline 10 MG capsule Commonly known as:  PAMELOR TAKE 1 CAPSULE (10 MG TOTAL) BY MOUTH AT BEDTIME. FOR NEUROPATHIC PAIN   Olopatadine HCl 0.2 % Soln Apply 1 drop to eye daily. One drop each eye daily What changed:    how much to take  how to take this  when to take this   omeprazole 20 MG capsule Commonly known as:  PRILOSEC Take 1 capsule (20 mg total) by mouth 2 (two) times daily before a meal.   polyethylene glycol powder powder Commonly known as:  GLYCOLAX/MIRALAX Take 17 g by mouth daily as needed  for moderate constipation (now bowel movement in 2 days). What changed:    when to take this  reasons to take this   [place] 200-200-20 MG/5ML suspension Generic drug:  alum & mag hydroxide-simeth Take 30 mLs by mouth every evening.   QC ANTACID/ANTI-GAS 200-200-20 MG/5ML suspension Generic drug:  alum & mag hydroxide-simeth TAKE 2 TABLESPOONFULS (62ml) BY MOUTH EACH EVENING.   senna 8.6 MG Tabs tablet Commonly known as:  SENOKOT TAKE 2 TABS (17.2MG ) BY MOUTH AT BEDTIME   SORE THROAT 1.4 % Liqd Generic drug:  phenol USE 5 SPRAYS IN THROAT EVERY 2 HOURS AS NEEDED FOR SORE THROAT.   traMADol 50 MG tablet Commonly known as:  ULTRAM Take 1 tablet (50 mg total) by mouth every 6 (six) hours as needed for moderate pain. What changed:    when to take this  reasons to take this   traZODone 100 MG tablet Commonly known as:  DESYREL Take 1 tablet (100 mg total) by mouth at bedtime. qhs What changed:  additional instructions      Follow-up Information    Mikey Kirschner, MD. Schedule an appointment as soon as possible for a visit in 2 week(s).   Specialty:  Family Medicine Contact information: [place] [address] [phone]          Allergies  Allergen Reactions  . Morphine And Related   . Opium    Allergies as of 03/10/2018      Reactions   Morphine And Related    Opium       Medication List    TAKE these medications   acetaminophen 500 MG tablet Commonly known as:  TYLENOL Take 1,000 mg by mouth 2 (two) times daily. 2 bid   AEROCHAMBER MINI CHAMBER Devi Use as directed   albuterol 108 (90 Base) MCG/ACT inhaler Commonly known as:  PROVENTIL HFA;VENTOLIN HFA Inhale 2 puffs into the lungs 4 (four) times daily as needed for wheezing or shortness of breath.   albuterol (2.5 MG/3ML) 0.083% nebulizer solution Commonly known as:  PROVENTIL Take 3 mLs (2.5 mg total) by nebulization every 4 (four) hours as needed for wheezing or  shortness of breath.   allopurinol 300 MG tablet Commonly known as:  ZYLOPRIM TAKE 1 TAB BY MOUTH EVERY DAY -- DX: GOUT   ALPRAZolam 0.25 MG tablet Commonly known as:  XANAX Take 1 tablet (0.25 mg total) by mouth 2 (two) times daily as needed for anxiety or sleep. Take 1 tablet BID What changed:    how much to take  how to take this  when to take this  reasons to take this   amLODipine 5 MG tablet Commonly known as:  NORVASC Take 5 mg by mouth daily.   BIOFREEZE EX Apply 1 application topically 4 (four) times daily as needed (for pain).   calcium carbonate 500 MG chewable tablet Commonly known as:  TUMS - dosed in mg elemental calcium Chew 2 tablets by mouth every 6 (six) hours as needed for indigestion or heartburn.   capsaicin 0.025 % cream Commonly known as:  ZOSTRIX Apply 1 application topically 2 (two) times daily as needed (apply to left ankle and feet as needed).   DESITIN 13 % Crea Generic drug:  Zinc Oxide Apply 1 application topically daily as needed (rash).   dextrose 40 % Gel Commonly known as:  GLUTOSE Take 1 Tube by mouth once as needed for low blood sugar (for levels <60).   diphenhydrAMINE 25 MG tablet Commonly known as:  BENADRYL Take 25 mg by mouth every 6 (six) hours as needed for itching.   doxycycline 100 MG capsule Commonly known as:  VIBRAMYCIN Take 1 capsule (100 mg total) by mouth 2 (two) times daily for 5 days.   enalapril 5 MG tablet  Commonly known as:  VASOTEC Take 1 tablet (5 mg total) by mouth daily. TAKE (1) TABLET BY MOUTH TWICE DAILY. What changed:    medication strength  how much to take  how to take this  when to take this   finasteride 5 MG tablet Commonly known as:  PROSCAR Take 5 mg by mouth daily.   fluticasone 50 MCG/ACT nasal spray Commonly known as:  FLONASE Place 2 sprays into both nostrils daily.   GENERLAC 10 GM/15ML Soln Generic drug:  lactulose (encephalopathy) TAKE 30 ML BY MOUTH EVERY DAY    glipiZIDE 2.5 MG 24 hr tablet Commonly known as:  GLUCOTROL XL Take 1 tablet (2.5 mg total) by mouth daily with breakfast.   hydrOXYzine 25 MG tablet Commonly known as:  ATARAX/VISTARIL Take 1 tablet (25 mg total) by mouth every 8 (eight) hours as needed for itching. What changed:    how much to take  how to take this  when to take this  reasons to take this  additional instructions   loratadine 10 MG tablet Commonly known as:  CLARITIN TAKE 1 TABLET BY MOUTH ONCE DAILY FOR ALLERGIC RHINITIS.   lovastatin 40 MG tablet Commonly known as:  MEVACOR TAKE 1 TAB BY MOUTH AT BEDTIME -- DX: CHOLESTEROL What changed:    how much to take  how to take this  when to take this  additional instructions   magic mouthwash Soln Take 15 mLs by mouth 4 (four) times daily as needed for mouth pain.   meclizine 25 MG tablet Commonly known as:  ANTIVERT TAKE 1 TAB BY MOUTH EVERY 6 HOURS AS NEEDED FOR DIZZINESS   metFORMIN 500 MG tablet Commonly known as:  GLUCOPHAGE TAKE 1 TAB BY MOUTH TWICE DAILY What changed:    how much to take  how to take this  when to take this  additional instructions   metoprolol tartrate 25 MG tablet Commonly known as:  LOPRESSOR Take 0.5 tablets (12.5 mg total) by mouth 2 (two) times daily.   neomycin-bacitracin-polymyxin ointment Commonly known as:  NEOSPORIN Apply 1 application topically every 12 (twelve) hours.   nortriptyline 10 MG capsule Commonly known as:  PAMELOR TAKE 1 CAPSULE (10 MG TOTAL) BY MOUTH AT BEDTIME. FOR NEUROPATHIC PAIN   Olopatadine HCl 0.2 % Soln Apply 1 drop to eye daily. One drop each eye daily What changed:    how much to take  how to take this  when to take this   omeprazole 20 MG capsule Commonly known as:  PRILOSEC Take 1 capsule (20 mg total) by mouth 2 (two) times daily before a meal.   polyethylene glycol powder powder Commonly known as:  GLYCOLAX/MIRALAX Take 17 g by mouth daily as needed for  moderate constipation (now bowel movement in 2 days). What changed:    when to take this  reasons to take this   [place] 200-200-20 MG/5ML suspension Generic drug:  alum & mag hydroxide-simeth Take 30 mLs by mouth every evening.   QC ANTACID/ANTI-GAS 200-200-20 MG/5ML suspension Generic drug:  alum & mag hydroxide-simeth TAKE 2 TABLESPOONFULS (35ml) BY MOUTH EACH EVENING.   senna 8.6 MG Tabs tablet Commonly known as:  SENOKOT TAKE 2 TABS (17.2MG ) BY MOUTH AT BEDTIME   SORE THROAT 1.4 % Liqd Generic drug:  phenol USE 5 SPRAYS IN THROAT EVERY 2 HOURS AS NEEDED FOR SORE THROAT.   traMADol 50 MG tablet Commonly known as:  ULTRAM Take 1 tablet (50 mg total) by mouth every  6 (six) hours as needed for moderate pain. What changed:    when to take this  reasons to take this   traZODone 100 MG tablet Commonly known as:  DESYREL Take 1 tablet (100 mg total) by mouth at bedtime. qhs What changed:  additional instructions      Procedures/Studies: Dg Chest 2 View  Result Date: 03/07/2018 CLINICAL DATA:  84 y/o  M; cough and shortness of breath. EXAM: CHEST - 2 VIEW COMPARISON:  08/10/2016 chest radiograph FINDINGS: Stable cardiomegaly given projection technique. Calcific aortic atherosclerosis. Elevated left hemidiaphragm and left basilar opacity. Trace right pleural effusion. No acute osseous abnormality. IMPRESSION: Elevated left hemidiaphragm and left basilar opacity which may represent associated atelectasis or pneumonia. Electronically Signed   By: Kristine Garbe M.D.   On: 03/07/2018 20:27   Dg Forearm Right  Result Date: 03/04/2018 CLINICAL DATA:  [place] in shower at care facility, skin tear. EXAM: RIGHT HUMERUS - 2+ VIEW; RIGHT FOREARM - 2 VIEW COMPARISON:  Chest radiograph August 10, 2016 FINDINGS: RIGHT humerus: There is no evidence of fracture or other focal bone lesions. Soft tissues are nonsuspicious. Tiny LEFT lung base nodular densities. RIGHT forearm: There  is no evidence of fracture or other focal bone lesions. Soft tissues are nonsuspicious. Mild vascular calcifications. IMPRESSION: 1. No acute osseous process. 2. Tiny LEFT lung base nodular densities, possible granulomas. Electronically Signed   By: Elon Alas M.D.   On: 03/04/2018 17:24   Ct Head Wo Contrast  Result Date: 03/04/2018 CLINICAL DATA:  84 y/o M; fall in shower and broke tail. Laceration over right arm. EXAM: CT HEAD WITHOUT CONTRAST TECHNIQUE: Contiguous axial images were obtained from the base of the skull through the vertex without intravenous contrast. COMPARISON:  07/04/2017 CT head FINDINGS: Brain: No evidence of acute infarction, hemorrhage, hydrocephalus, extra-axial collection or mass lesion/mass effect. 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 Vascular: Calcific atherosclerosis of carotid siphons and vertebral arteries. No hyperdense vessel identified. Skull: Laceration to the right supraorbital scalp soft tissues. No acute fracture identified. Sinuses/Orbits: No acute finding. Other: None. IMPRESSION: 1. Laceration to the right supraorbital scalp soft tissues. 2. No acute fracture or intracranial abnormality identified. 3. 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 Electronically Signed   By: Kristine Garbe M.D.   On: 03/04/2018 18:12   Dg Humerus Right  Result Date: 03/04/2018 CLINICAL DATA:  [place] in shower at care facility, skin tear. EXAM: RIGHT HUMERUS - 2+ VIEW; RIGHT FOREARM - 2 VIEW COMPARISON:  Chest radiograph August 10, 2016 FINDINGS: RIGHT humerus: There is no evidence of fracture or other focal bone lesions. Soft tissues are nonsuspicious. Tiny LEFT lung base nodular densities. RIGHT forearm: There is no evidence of fracture or other  focal bone lesions. Soft tissues are nonsuspicious. Mild vascular calcifications. IMPRESSION: 1. No acute osseous process. 2. Tiny LEFT lung base nodular densities, possible granulomas. Electronically Signed   By: Elon Alas M.D.   On: 03/04/2018 17:24      Subjective: Pt denies complaints but would like to eat his breakfast.  He is hungry.  He overall is feeling much better.  He has persistent dry nonproductive cough.  No fever or chills.  No chest pain and no SOB.    Discharge Exam: Vitals:   03/09/18 2241 03/10/18 0543  BP: 110/61 (!) 134/93  Pulse: 70  82  Resp: 16   Temp:  98.7 F (37.1 C)  SpO2: 92% 95%   Vitals:   03/09/18 2132 03/09/18 2241 03/10/18 0409 03/10/18 0543  BP: (!) 180/101 110/61  (!) 134/93  Pulse: 81 70  82  Resp: 16 16    Temp: 98.5 F (36.9 C)   98.7 F (37.1 C)  TempSrc: Oral   Oral  SpO2: 96% 92%  95%  Weight:   69.5 kg (153 lb 3.5 oz)   Height:        General: Pt is alert, awake, not in acute distress, he coughs occasionally.  Cardiovascular: RRR, S1/S2 +, no rubs, no gallops Respiratory: CTA bilaterally, no wheezing, no rhonchi Abdominal: Soft, NT, ND, bowel sounds + Extremities: no edema, no cyanosis   The results of significant diagnostics from this hospitalization (including imaging, microbiology, ancillary and laboratory) are listed below for reference.     Microbiology: Recent Results (from the past 240 hour(s))  Culture, blood (Routine X 2) w Reflex to ID Panel     Status: None (Preliminary result)   Collection Time: 03/07/18 10:21 PM  Result Value Ref Range Status   Specimen Description LEFT ANTECUBITAL  Final   Special Requests   Final    BOTTLES DRAWN AEROBIC AND ANAEROBIC Blood Culture adequate volume   Culture   Final    NO GROWTH 3 DAYS Performed at [facility], [address] [place],  [address]    Report Status PENDING  Incomplete  Culture, blood (Routine X 2) w Reflex to ID Panel     Status: None  (Preliminary result)   Collection Time: 03/07/18 10:24 PM  Result Value Ref Range Status   Specimen Description BLOOD RIGHT WRIST  Final   Special Requests   Final    BOTTLES DRAWN AEROBIC AND ANAEROBIC Blood Culture adequate volume   Culture   Final    NO GROWTH 3 DAYS Performed at [facility], [address], [place],  [address]    Report Status PENDING  Incomplete     Labs: BNP (last 3 results) Recent Labs    03/07/18 2020  BNP 465.0*   Basic Metabolic Panel: Recent Labs  Lab 03/07/18 2020 03/08/18 0415 03/10/18 0553  NA 134* 134* 144  K 3.9 3.4* 3.2*  CL 97* 98* 100*  CO2 24 25 24   GLUCOSE 161* 113* 166*  BUN 57* 46* 34*  CREATININE 2.03* 1.33* 0.93  CALCIUM 8.6* 8.4* 9.2   Liver Function Tests: Recent Labs  Lab 03/07/18 2020  AST 25  ALT 16*  ALKPHOS 57  BILITOT 0.5  PROT 6.3*  ALBUMIN 3.4*   No results for input(s): LIPASE, AMYLASE in the last 168 hours. No results for input(s): AMMONIA in the last 168 hours. CBC: Recent Labs  Lab 03/07/18 2020 03/08/18 0415  WBC 11.3* 9.5  NEUTROABS 9.1*  --   HGB 12.1* 11.5*  HCT 37.0* 34.6*  MCV 94.1 93.3  PLT 270 246   Cardiac Enzymes: Recent Labs  Lab 03/07/18 2020  TROPONINI 0.20*   BNP: Invalid input(s): POCBNP CBG: Recent Labs  Lab 03/09/18 0721 03/09/18 1126 03/09/18 1604 03/10/18 0247 03/10/18 0757  GLUCAP 120* 147* 126* 160* 155*   D-Dimer No results for input(s): DDIMER in the last 72 hours. Hgb A1c No results for input(s): HGBA1C in the last 72 hours. Lipid Profile No results for input(s): CHOL, HDL, LDLCALC, TRIG, CHOLHDL, LDLDIRECT in the last 72 hours. Thyroid function studies No results for  input(s): TSH, T4TOTAL, T3FREE, THYROIDAB in the last 72 hours.  Invalid input(s): FREET3 Anemia work up No results for input(s): VITAMINB12, FOLATE, FERRITIN, TIBC, IRON, RETICCTPCT in the last 72 hours. Urinalysis    Component Value Date/Time   COLORURINE YELLOW 03/07/2018  1944   APPEARANCEUR HAZY (A) 03/07/2018 1944   LABSPEC 1.020 03/07/2018 1944   PHURINE 5.0 03/07/2018 1944   GLUCOSEU NEGATIVE 03/07/2018 1944   HGBUR NEGATIVE 03/07/2018 1944   BILIRUBINUR NEGATIVE 03/07/2018 1944   KETONESUR NEGATIVE 03/07/2018 1944   PROTEINUR NEGATIVE 03/07/2018 1944   UROBILINOGEN 0.2 01/07/2014 0956   NITRITE NEGATIVE 03/07/2018 1944   LEUKOCYTESUR NEGATIVE 03/07/2018 1944   Sepsis Labs Invalid input(s): PROCALCITONIN,  WBC,  LACTICIDVEN Microbiology Recent Results (from the past 240 hour(s))  Culture, blood (Routine X 2) w Reflex to ID Panel     Status: None (Preliminary result)   Collection Time: 03/07/18 10:21 PM  Result Value Ref Range Status   Specimen Description LEFT ANTECUBITAL  Final   Special Requests   Final    BOTTLES DRAWN AEROBIC AND ANAEROBIC Blood Culture adequate volume   Culture   Final    NO GROWTH 3 DAYS Performed at [facility], [address] [place], [place] [address]    Report Status PENDING  Incomplete  Culture, blood (Routine X 2) w Reflex to ID Panel     Status: None (Preliminary result)   Collection Time: 03/07/18 10:24 PM  Result Value Ref Range Status   Specimen Description BLOOD RIGHT WRIST  Final   Special Requests   Final    BOTTLES DRAWN AEROBIC AND ANAEROBIC Blood Culture adequate volume   Culture   Final    NO GROWTH 3 DAYS Performed at [facility], [address], [place], [place] [address]    Report Status PENDING  Incomplete   Time coordinating discharge: 35 minutes  SIGNED:  Irwin Brakeman, MD  [facility] 03/10/2018, 11:16 AM Pager [phone]  If 7PM-7AM, please contact night-coverage [email or web address] Password [email or web address]

## 2018-03-10 NOTE — Care Management (Signed)
Patient requires frequent re-positioning of the body in ways that cannot be achieved with an ordinary bed or wedge pillow, to eliminate pain, reduce pressure, and the head of the bed to be elevated more than 30 degrees most of the time due to CHF 

## 2018-03-10 NOTE — Progress Notes (Signed)
Patients heart rate is sustaining in the 120's-130's Afib on the monitor.  On-call MD notified.  New orders received.

## 2018-03-10 NOTE — Progress Notes (Signed)
Patient is running Afib in the 140's On-call MD notified via text page.  Order for 12 lead EKG and metoprolol received.  On-call MD notified of abnormal EKG via text page.  No new orders.  Patient currently has a heart rate of 96.  Will continue to monitor.

## 2018-03-10 NOTE — Progress Notes (Signed)
Palliative:  [name] is resting quietly in bed.  He greets me making and keeping eye contact.  He is calm and cooperative.  Able to make his needs known effectively.  He looks improved from yesterday.  Present today at bedside is niece/HC POA, Christian Thomas.  We talked about disposition to [facility].  Christian Thomas has been a resident there since 2012, and is well-known to the staff.    Christian and I talk about hospice and palliative care services of [place].  She is working with [facility] for hospice/palliative services for Christian Thomas.  She has some questions related to services and equipment.  I encouraged her to work closely with the hospice provider, build a relationship with them.   I encouraged Christian Thomas to call with any problems or questions in the future.  35 minutes  Quinn Axe, NP Palliative Medicine Team Team Phone # [phone] (Nights/Weekends)

## 2018-03-10 NOTE — Telephone Encounter (Signed)
Tracey at [facility] is aware we have sent the orders over.

## 2018-03-10 NOTE — Care Management Important Message (Signed)
Important Message  Patient Details  Name: Christian Thomas MRN: [id number] Date of Birth: 07-Oct-1915   Medicare Important Message Given:  Yes    Sherald Barge, RN 03/10/2018, 11:11 AM

## 2018-03-11 DIAGNOSIS — I110 Hypertensive heart disease with heart failure: Secondary | ICD-10-CM | POA: Diagnosis not present

## 2018-03-11 DIAGNOSIS — J449 Chronic obstructive pulmonary disease, unspecified: Secondary | ICD-10-CM | POA: Diagnosis not present

## 2018-03-11 DIAGNOSIS — E119 Type 2 diabetes mellitus without complications: Secondary | ICD-10-CM | POA: Diagnosis not present

## 2018-03-11 DIAGNOSIS — I5020 Unspecified systolic (congestive) heart failure: Secondary | ICD-10-CM | POA: Diagnosis not present

## 2018-03-11 DIAGNOSIS — M170 Bilateral primary osteoarthritis of knee: Secondary | ICD-10-CM | POA: Diagnosis not present

## 2018-03-11 DIAGNOSIS — R296 Repeated falls: Secondary | ICD-10-CM | POA: Diagnosis not present

## 2018-03-12 LAB — CULTURE, BLOOD (ROUTINE X 2)
CULTURE: NO GROWTH
Culture: NO GROWTH
SPECIAL REQUESTS: ADEQUATE
SPECIAL REQUESTS: ADEQUATE

## 2018-03-13 ENCOUNTER — Telehealth: Payer: Self-pay | Admitting: Family Medicine

## 2018-03-13 NOTE — Telephone Encounter (Signed)
Patients daughter, Caryl Asp, is requesting for Dr. Richardson Landry to stop by [name] sometime early this week.  Gershon Mussel has been really weak and the stitches in his head needs to come out.  Also, he needs a face to face for his oxygen because it has been running low.

## 2018-03-13 NOTE — Telephone Encounter (Signed)
Will do because of pts weakness, I saw at hosp Thursday, not sure which day I'll come

## 2018-03-13 NOTE — Telephone Encounter (Signed)
[name](POA) was notified and verbalized understanding.

## 2018-03-14 ENCOUNTER — Non-Acute Institutional Stay: Payer: Self-pay | Admitting: Hospice and Palliative Medicine

## 2018-03-14 ENCOUNTER — Telehealth: Payer: Self-pay | Admitting: Family Medicine

## 2018-03-14 VITALS — BP 110/60 | HR 56 | Resp 20

## 2018-03-14 DIAGNOSIS — Z515 Encounter for palliative care: Secondary | ICD-10-CM

## 2018-03-14 NOTE — Telephone Encounter (Signed)
Discussed with pt's niece Caryl Asp and she states she will be there tonight at 6pm. Left message with pallativie care nurse Archie Endo.

## 2018-03-14 NOTE — Telephone Encounter (Signed)
Hospice calling with multiple concerns for Christian Thomas. He's not eating, not taking his medicines, needs oxygen, she thinks he needs to be put in Hospice care soon.  Please call to discuss next options.

## 2018-03-14 NOTE — Telephone Encounter (Signed)
Actually I said I would see this wk did no knoe the day, call joy i'll see this eve at 6pm if they'd le to be there

## 2018-03-14 NOTE — Progress Notes (Signed)
INITIAL PALLIATIVE CARE CONSULT VISIT   PATIENT NAME: Christian Thomas DOB: 1915-07-30 MRN: [id number]  PRIMARY CARE PROVIDER: Mikey Kirschner, MD  REFERRING PROVIDER: Mikey Thomas, [address], [place] [address]  RESPONSIBLE PARTY:  Acct ID - Guarantor Home Phone Work Phone Relationship Acct Type  [id number] Christian Thomas, RANN* [phone]  Self P/F     [place], [place], [place] [address]   BILLABLE ICD-10: Weakness  R53.1 PRIMARY DECISION MAKER: Niece, Christian Thomas PRIMARY CAREGIVER: Christian Thomas AL   RECOMMENDATIONS and PLAN:  1. Adult failure to thrive/weakness: he is unable to participate in therapy. He is sleeping most of the day and is not getting out of bed. His oxygen sats are at 91% on room air. He is currently getting treated with doxycycline, however, lung sounds are coarse with a "rattle." He is not talking. He did not eat anything at dinner last night or breakfast this morning. He has had a significant weight loss per family. I am recommending Hospice services. 2. Pill burden: I would recommend [place] all PO meds at this time.  3. ACP: discussed MOST form with niece...she would like comfort measures in place. He has DNR status already in chart.  I spent 60 minutes providing this consultation,  from 11:00am to 12:00 pm.. More than 50% of the time in this consultation was spent assessing patient, discussing goals of care with niece, discussing medications and coordinating communication.    HISTORY OF PRESENT ILLNESS:  Christian Thomas is a 84 y.o. male who resides at [facility] and has been referred to palliative care by his PCP for assistance with goals of care and symptom management. He has recently been sent to [facility] for a fall requiring sutures to his head and again for pna. He has had a significant decline.   CODE STATUS: DNR  ADVANCED DIRECTIVES: Y  PPS: 20% HOSPICE ELIGIBILITY/DIAGNOSIS: yes, protein calorie  malnutrition, pna, hx of CVA, fall with head injury  PAST MEDICAL HISTORY:  Past Medical History:  Diagnosis Date  . Benign essential tremor   . BPH (benign prostatic hypertrophy)   . Cerebellar stroke syndrome   . CHF (congestive heart failure) ([place])   . Chronic constipation   . Clostridium difficile colitis 12/2008  . DDD (degenerative disc disease), lumbar   . Diabetic neuropathy ([place])   . Diverticulosis   . Dysrhythmia   . Essential hypertension, benign   . Gout   . Macular degeneration   . Mixed hyperlipidemia   . Stroke ([place])   . Type 2 diabetes mellitus ([place])   . Venous insufficiency     SOCIAL HX:  Social History   Tobacco Use  . Smoking status: Former Smoker    Packs/day: 0.50    Years: 54.00    Pack years: 27.00    Types: Cigarettes    Last attempt to quit: 11/19/1992    Years since quitting: 25.3  . Smokeless tobacco: Never Used  Substance Use Topics  . Alcohol use: Yes    Comment: Rare    ALLERGIES:  Allergies  Allergen Reactions  . Morphine And Related   . Opium      REVIEWED PERTINENT MEDICATIONS:  REVIEWED    PHYSICAL EXAM:   GEN: Frail appearing, thin LUNGS: Course rhonchi in all lobes; + rattle sound CARDIAC: Irreg rhythm; bradycardic ABD: soft, active BS, NTTP EXTREMITIES: thin, no edema; no cyanosis or mottling at this time SKIN: pale, thin and easily  bruised  NEURO: able to arouse but closes his eyes quickly afterwards. Not speaking to me; not following commands. +generalized weakness  Of note: patient woke up asking to call the funeral home    Christian Rancher, NP

## 2018-03-14 NOTE — Telephone Encounter (Signed)
Rodena Piety from hospice calling to get order for hospice care. She met with pt for first time today. She does think he needs hospice care. Lungs were full. O2 91. Did not eat yesterday or today. Not talking much. Would not talk to nurse. Said a few words to [name]'s husband. Still taking all his meds even though he is not eating. States she was told you was coming to see pt on Friday and she does not think he will still be living by then. Would like order to d/c meds and to do comfort measures. Rodena Piety states she spoke with Caryl Asp and she is ok with hospice care as long as it is what dr Richardson Landry thinks is best.

## 2018-03-15 ENCOUNTER — Ambulatory Visit: Payer: Medicare Other | Admitting: Family Medicine

## 2018-03-15 DIAGNOSIS — I679 Cerebrovascular disease, unspecified: Secondary | ICD-10-CM | POA: Diagnosis not present

## 2018-03-15 DIAGNOSIS — M109 Gout, unspecified: Secondary | ICD-10-CM | POA: Diagnosis not present

## 2018-03-15 DIAGNOSIS — I10 Essential (primary) hypertension: Secondary | ICD-10-CM | POA: Diagnosis not present

## 2018-03-15 DIAGNOSIS — R1310 Dysphagia, unspecified: Secondary | ICD-10-CM | POA: Diagnosis not present

## 2018-03-15 DIAGNOSIS — R54 Age-related physical debility: Secondary | ICD-10-CM | POA: Diagnosis not present

## 2018-03-15 DIAGNOSIS — E785 Hyperlipidemia, unspecified: Secondary | ICD-10-CM | POA: Diagnosis not present

## 2018-03-15 DIAGNOSIS — R634 Abnormal weight loss: Secondary | ICD-10-CM | POA: Diagnosis not present

## 2018-03-15 DIAGNOSIS — I739 Peripheral vascular disease, unspecified: Secondary | ICD-10-CM | POA: Diagnosis not present

## 2018-03-15 DIAGNOSIS — E46 Unspecified protein-calorie malnutrition: Secondary | ICD-10-CM | POA: Diagnosis not present

## 2018-03-15 DIAGNOSIS — I509 Heart failure, unspecified: Secondary | ICD-10-CM | POA: Diagnosis not present

## 2018-03-15 DIAGNOSIS — J309 Allergic rhinitis, unspecified: Secondary | ICD-10-CM | POA: Diagnosis not present

## 2018-03-15 DIAGNOSIS — G9009 Other idiopathic peripheral autonomic neuropathy: Secondary | ICD-10-CM | POA: Diagnosis not present

## 2018-03-15 DIAGNOSIS — I4891 Unspecified atrial fibrillation: Secondary | ICD-10-CM | POA: Diagnosis not present

## 2018-03-15 DIAGNOSIS — N401 Enlarged prostate with lower urinary tract symptoms: Secondary | ICD-10-CM | POA: Diagnosis not present

## 2018-03-15 DIAGNOSIS — E1159 Type 2 diabetes mellitus with other circulatory complications: Secondary | ICD-10-CM | POA: Diagnosis not present

## 2018-03-15 NOTE — Telephone Encounter (Signed)
Tried to call anita cody pallative care nurse again and voicemail was full

## 2018-03-15 NOTE — Telephone Encounter (Signed)
Verbal orders for hospice care given to Christian Thomas. She will fax over written orders for doctor to sign.

## 2018-03-16 DIAGNOSIS — I739 Peripheral vascular disease, unspecified: Secondary | ICD-10-CM | POA: Diagnosis not present

## 2018-03-16 DIAGNOSIS — I679 Cerebrovascular disease, unspecified: Secondary | ICD-10-CM | POA: Diagnosis not present

## 2018-03-16 DIAGNOSIS — E46 Unspecified protein-calorie malnutrition: Secondary | ICD-10-CM | POA: Diagnosis not present

## 2018-03-16 DIAGNOSIS — G9009 Other idiopathic peripheral autonomic neuropathy: Secondary | ICD-10-CM | POA: Diagnosis not present

## 2018-03-16 DIAGNOSIS — I509 Heart failure, unspecified: Secondary | ICD-10-CM | POA: Diagnosis not present

## 2018-03-16 DIAGNOSIS — I4891 Unspecified atrial fibrillation: Secondary | ICD-10-CM | POA: Diagnosis not present

## 2018-03-17 ENCOUNTER — Ambulatory Visit: Payer: Medicare Other | Admitting: Family Medicine

## 2018-03-17 ENCOUNTER — Telehealth: Payer: Self-pay | Admitting: Family Medicine

## 2018-03-17 DIAGNOSIS — I679 Cerebrovascular disease, unspecified: Secondary | ICD-10-CM | POA: Diagnosis not present

## 2018-03-17 DIAGNOSIS — E43 Unspecified severe protein-calorie malnutrition: Secondary | ICD-10-CM

## 2018-03-17 DIAGNOSIS — J181 Lobar pneumonia, unspecified organism: Secondary | ICD-10-CM | POA: Diagnosis not present

## 2018-03-17 DIAGNOSIS — I739 Peripheral vascular disease, unspecified: Secondary | ICD-10-CM | POA: Diagnosis not present

## 2018-03-17 DIAGNOSIS — J189 Pneumonia, unspecified organism: Secondary | ICD-10-CM

## 2018-03-17 DIAGNOSIS — I509 Heart failure, unspecified: Secondary | ICD-10-CM | POA: Diagnosis not present

## 2018-03-17 DIAGNOSIS — E46 Unspecified protein-calorie malnutrition: Secondary | ICD-10-CM | POA: Diagnosis not present

## 2018-03-17 DIAGNOSIS — G9009 Other idiopathic peripheral autonomic neuropathy: Secondary | ICD-10-CM | POA: Diagnosis not present

## 2018-03-17 DIAGNOSIS — I4891 Unspecified atrial fibrillation: Secondary | ICD-10-CM | POA: Diagnosis not present

## 2018-03-17 NOTE — Progress Notes (Signed)
03/17/2018  Subjective patient visited at [facility].  This morning the nurses reported good and improving appetite.  Today here at noon family reports patient appears to be in low spirits.  Patient states that he is ready to go.  He does report, however, that his breathing seems to be a little better.  Reports no chest pain no back pain no abdominal pain  Vitals see center records  Alert/somewhat subdued/no acute distress.  Sitting up in bed.  Mucous membranes moist.  Patient completely oriented  Lungs some basilar crackles.  Wheezes heard several days ago have now disappeared.  No tachypnea heart normal rate and rhythm  Impression #1 status post hospitalization for pneumonia.  2 oral intake still suboptimal but somewhat improved.  Complicated by element of dysphasia.  Patient requests Cheerios so we will certainly allow this in addition to fluids plus pured diet.  .3.  Both patient and family having apparent mixed feelings regarding recovery versus palliative intervention.  A couple days ago patient was upbeat and hopeful for recovery, at this time he is more downbeat and almost hopeful for departure.  Family also has mixed feelings, as frankly I do.  Until very recently patient has had decent quality of life and exquisite mental function despite his generalized frailty secondary to his age.  Hospice has been consulted, I think appropriately, we will maintain today's therapy as delineated earlier this week.  I will see him weekly, Mickie Hillier MD

## 2018-03-17 NOTE — Telephone Encounter (Signed)
ok 

## 2018-03-17 NOTE — Telephone Encounter (Signed)
FYI,  [facility] called saying that she just received notice from family that they have put Christian Thomas in hospice so she is discharging him from home health.  They will fax this info over to be signed.

## 2018-03-18 DIAGNOSIS — I739 Peripheral vascular disease, unspecified: Secondary | ICD-10-CM | POA: Diagnosis not present

## 2018-03-18 DIAGNOSIS — E46 Unspecified protein-calorie malnutrition: Secondary | ICD-10-CM | POA: Diagnosis not present

## 2018-03-18 DIAGNOSIS — G9009 Other idiopathic peripheral autonomic neuropathy: Secondary | ICD-10-CM | POA: Diagnosis not present

## 2018-03-18 DIAGNOSIS — I509 Heart failure, unspecified: Secondary | ICD-10-CM | POA: Diagnosis not present

## 2018-03-18 DIAGNOSIS — I4891 Unspecified atrial fibrillation: Secondary | ICD-10-CM | POA: Diagnosis not present

## 2018-03-18 DIAGNOSIS — I679 Cerebrovascular disease, unspecified: Secondary | ICD-10-CM | POA: Diagnosis not present

## 2018-03-20 DIAGNOSIS — I4891 Unspecified atrial fibrillation: Secondary | ICD-10-CM | POA: Diagnosis not present

## 2018-03-20 DIAGNOSIS — E46 Unspecified protein-calorie malnutrition: Secondary | ICD-10-CM | POA: Diagnosis not present

## 2018-03-20 DIAGNOSIS — I679 Cerebrovascular disease, unspecified: Secondary | ICD-10-CM | POA: Diagnosis not present

## 2018-03-20 DIAGNOSIS — I509 Heart failure, unspecified: Secondary | ICD-10-CM | POA: Diagnosis not present

## 2018-03-20 DIAGNOSIS — I739 Peripheral vascular disease, unspecified: Secondary | ICD-10-CM | POA: Diagnosis not present

## 2018-03-20 DIAGNOSIS — G9009 Other idiopathic peripheral autonomic neuropathy: Secondary | ICD-10-CM | POA: Diagnosis not present

## 2018-03-21 DIAGNOSIS — E46 Unspecified protein-calorie malnutrition: Secondary | ICD-10-CM | POA: Diagnosis not present

## 2018-03-21 DIAGNOSIS — I679 Cerebrovascular disease, unspecified: Secondary | ICD-10-CM | POA: Diagnosis not present

## 2018-03-21 DIAGNOSIS — I739 Peripheral vascular disease, unspecified: Secondary | ICD-10-CM | POA: Diagnosis not present

## 2018-03-21 DIAGNOSIS — I4891 Unspecified atrial fibrillation: Secondary | ICD-10-CM | POA: Diagnosis not present

## 2018-03-21 DIAGNOSIS — G9009 Other idiopathic peripheral autonomic neuropathy: Secondary | ICD-10-CM | POA: Diagnosis not present

## 2018-03-21 DIAGNOSIS — I509 Heart failure, unspecified: Secondary | ICD-10-CM | POA: Diagnosis not present

## 2018-03-22 ENCOUNTER — Telehealth: Payer: Self-pay | Admitting: Family Medicine

## 2018-03-22 ENCOUNTER — Ambulatory Visit: Payer: Medicare Other | Admitting: Family Medicine

## 2018-03-22 DIAGNOSIS — I509 Heart failure, unspecified: Secondary | ICD-10-CM | POA: Diagnosis not present

## 2018-03-22 DIAGNOSIS — R531 Weakness: Secondary | ICD-10-CM | POA: Diagnosis not present

## 2018-03-22 DIAGNOSIS — I4891 Unspecified atrial fibrillation: Secondary | ICD-10-CM | POA: Diagnosis not present

## 2018-03-22 DIAGNOSIS — E43 Unspecified severe protein-calorie malnutrition: Secondary | ICD-10-CM

## 2018-03-22 DIAGNOSIS — M542 Cervicalgia: Secondary | ICD-10-CM | POA: Diagnosis not present

## 2018-03-22 DIAGNOSIS — R2681 Unsteadiness on feet: Secondary | ICD-10-CM | POA: Diagnosis not present

## 2018-03-22 DIAGNOSIS — E46 Unspecified protein-calorie malnutrition: Secondary | ICD-10-CM | POA: Diagnosis not present

## 2018-03-22 DIAGNOSIS — G9009 Other idiopathic peripheral autonomic neuropathy: Secondary | ICD-10-CM | POA: Diagnosis not present

## 2018-03-22 DIAGNOSIS — J181 Lobar pneumonia, unspecified organism: Secondary | ICD-10-CM | POA: Diagnosis not present

## 2018-03-22 DIAGNOSIS — E118 Type 2 diabetes mellitus with unspecified complications: Secondary | ICD-10-CM

## 2018-03-22 DIAGNOSIS — M6281 Muscle weakness (generalized): Secondary | ICD-10-CM | POA: Diagnosis not present

## 2018-03-22 DIAGNOSIS — I679 Cerebrovascular disease, unspecified: Secondary | ICD-10-CM | POA: Diagnosis not present

## 2018-03-22 DIAGNOSIS — I739 Peripheral vascular disease, unspecified: Secondary | ICD-10-CM | POA: Diagnosis not present

## 2018-03-22 NOTE — Telephone Encounter (Signed)
[facility], change vent neb rx's to q I d prn only (pt has been taking regulaly)  Up in chair daily  Capsaicin cr .025 per cent cr bid to both feet

## 2018-03-22 NOTE — Telephone Encounter (Signed)
done

## 2018-03-22 NOTE — Telephone Encounter (Signed)
Spoke with Miranda at [facility]. Orders need to be faxed over. Orders written out, awaiting signature.

## 2018-03-22 NOTE — Telephone Encounter (Signed)
Opened per provider request

## 2018-03-23 ENCOUNTER — Telehealth: Payer: Self-pay | Admitting: *Deleted

## 2018-03-23 NOTE — Telephone Encounter (Signed)
[name] called for patient stating patient gets very dizzy when moving to or from the wheel chair and sitting up and they are not sure what is causing the dizzy spells. Please advise

## 2018-03-23 NOTE — Telephone Encounter (Signed)
hard to know for sure, sometimes change in position when person has been lying in bed for a long time can trigger inner ear dizziness,  if dizziness is severe or intolerable will need to hold off on sitting up in chair

## 2018-03-23 NOTE — Telephone Encounter (Signed)
Spoke with Caryl Asp and she stated that they have held off on getting up in chair. [name] stated that each time patient got up he stated he needed to lay back down.

## 2018-03-23 NOTE — Telephone Encounter (Signed)
ok 

## 2018-03-24 DIAGNOSIS — G9009 Other idiopathic peripheral autonomic neuropathy: Secondary | ICD-10-CM | POA: Diagnosis not present

## 2018-03-24 DIAGNOSIS — I4891 Unspecified atrial fibrillation: Secondary | ICD-10-CM | POA: Diagnosis not present

## 2018-03-24 DIAGNOSIS — I679 Cerebrovascular disease, unspecified: Secondary | ICD-10-CM | POA: Diagnosis not present

## 2018-03-24 DIAGNOSIS — E46 Unspecified protein-calorie malnutrition: Secondary | ICD-10-CM | POA: Diagnosis not present

## 2018-03-24 DIAGNOSIS — I739 Peripheral vascular disease, unspecified: Secondary | ICD-10-CM | POA: Diagnosis not present

## 2018-03-24 DIAGNOSIS — I509 Heart failure, unspecified: Secondary | ICD-10-CM | POA: Diagnosis not present

## 2018-03-26 ENCOUNTER — Encounter: Payer: Self-pay | Admitting: Family Medicine

## 2018-03-26 NOTE — Progress Notes (Signed)
Oh this is a note for  off-site visit to our facility for patient.  Currently at [facility].  Recently in hospital with pneumonia and reactive airways.  Breathing continues to improve.  Unfortunately still profoundly weak.  Hospice folks came in and evaluated and have again plan for him.  Patient notes vitals stable.  BP 136/82.  Pulse 80 temp 98.6 alert no acute distress quiet laying in bed.  Pulmonary exam no wheezes currently no inspiratory crackles no baseline tachypnea heart regular rate and rhythm feet pulses decent no edema no point tenderness    Impression #1 status post pneumonia with profound weakness and debility and recent hospice referral  2.  Reactive airways clinically improved will back off on nebulized  3.  Bilateral foot pain.  Likely related to neuropathy.  Will resume capsaicin cream rationale discussed with family  We will attempt to increase activity.  Up in chair daily.  Discussion held with familyChange neb treatments to as needed.  Add capsaicin as noted.  We will reevaluate next week

## 2018-03-27 DIAGNOSIS — I509 Heart failure, unspecified: Secondary | ICD-10-CM | POA: Diagnosis not present

## 2018-03-27 DIAGNOSIS — I4891 Unspecified atrial fibrillation: Secondary | ICD-10-CM | POA: Diagnosis not present

## 2018-03-27 DIAGNOSIS — E46 Unspecified protein-calorie malnutrition: Secondary | ICD-10-CM | POA: Diagnosis not present

## 2018-03-27 DIAGNOSIS — I679 Cerebrovascular disease, unspecified: Secondary | ICD-10-CM | POA: Diagnosis not present

## 2018-03-27 DIAGNOSIS — I739 Peripheral vascular disease, unspecified: Secondary | ICD-10-CM | POA: Diagnosis not present

## 2018-03-27 DIAGNOSIS — G9009 Other idiopathic peripheral autonomic neuropathy: Secondary | ICD-10-CM | POA: Diagnosis not present

## 2018-03-28 ENCOUNTER — Telehealth: Payer: Self-pay | Admitting: *Deleted

## 2018-03-28 DIAGNOSIS — E46 Unspecified protein-calorie malnutrition: Secondary | ICD-10-CM | POA: Diagnosis not present

## 2018-03-28 DIAGNOSIS — I739 Peripheral vascular disease, unspecified: Secondary | ICD-10-CM | POA: Diagnosis not present

## 2018-03-28 DIAGNOSIS — G9009 Other idiopathic peripheral autonomic neuropathy: Secondary | ICD-10-CM | POA: Diagnosis not present

## 2018-03-28 DIAGNOSIS — I509 Heart failure, unspecified: Secondary | ICD-10-CM | POA: Diagnosis not present

## 2018-03-28 DIAGNOSIS — I4891 Unspecified atrial fibrillation: Secondary | ICD-10-CM | POA: Diagnosis not present

## 2018-03-28 DIAGNOSIS — I679 Cerebrovascular disease, unspecified: Secondary | ICD-10-CM | POA: Diagnosis not present

## 2018-03-28 NOTE — Telephone Encounter (Signed)
sure

## 2018-03-28 NOTE — Telephone Encounter (Signed)
Hospice nurse [facility]? [facility] calling to get verbal order for speech evaluation. Family wants to change order from a puree diet to something else. Was on the puree diet due to aspiration pneumonia. Nurse feels like he should at least have speech evaluation first to see which type of diet he will do ok with.  [phone]

## 2018-03-28 NOTE — Telephone Encounter (Signed)
Verbal order given to hospice nurse

## 2018-03-29 ENCOUNTER — Ambulatory Visit: Payer: Medicare Other | Admitting: Family Medicine

## 2018-03-29 DIAGNOSIS — J181 Lobar pneumonia, unspecified organism: Secondary | ICD-10-CM | POA: Diagnosis not present

## 2018-03-29 DIAGNOSIS — E43 Unspecified severe protein-calorie malnutrition: Secondary | ICD-10-CM | POA: Diagnosis not present

## 2018-03-29 DIAGNOSIS — E118 Type 2 diabetes mellitus with unspecified complications: Secondary | ICD-10-CM | POA: Diagnosis not present

## 2018-03-29 DIAGNOSIS — Z8673 Personal history of transient ischemic attack (TIA), and cerebral infarction without residual deficits: Secondary | ICD-10-CM

## 2018-03-29 DIAGNOSIS — R2681 Unsteadiness on feet: Secondary | ICD-10-CM | POA: Diagnosis not present

## 2018-03-29 DIAGNOSIS — I509 Heart failure, unspecified: Secondary | ICD-10-CM | POA: Diagnosis not present

## 2018-03-29 DIAGNOSIS — E46 Unspecified protein-calorie malnutrition: Secondary | ICD-10-CM | POA: Diagnosis not present

## 2018-03-29 DIAGNOSIS — M6281 Muscle weakness (generalized): Secondary | ICD-10-CM | POA: Diagnosis not present

## 2018-03-29 DIAGNOSIS — G9009 Other idiopathic peripheral autonomic neuropathy: Secondary | ICD-10-CM | POA: Diagnosis not present

## 2018-03-29 DIAGNOSIS — I739 Peripheral vascular disease, unspecified: Secondary | ICD-10-CM | POA: Diagnosis not present

## 2018-03-29 DIAGNOSIS — I679 Cerebrovascular disease, unspecified: Secondary | ICD-10-CM | POA: Diagnosis not present

## 2018-03-29 DIAGNOSIS — I4891 Unspecified atrial fibrillation: Secondary | ICD-10-CM | POA: Diagnosis not present

## 2018-03-30 ENCOUNTER — Telehealth: Payer: Self-pay | Admitting: Family Medicine

## 2018-03-30 NOTE — Telephone Encounter (Signed)
Form in dr steve's folder 

## 2018-03-30 NOTE — Telephone Encounter (Signed)
Patients daughter called and requested that Dr. Richardson Landry fax back over the order for Christian Thomas's textured diet.  They need this ASAP.  This was placed at the nurse's station.

## 2018-03-31 DIAGNOSIS — I739 Peripheral vascular disease, unspecified: Secondary | ICD-10-CM | POA: Diagnosis not present

## 2018-03-31 DIAGNOSIS — I4891 Unspecified atrial fibrillation: Secondary | ICD-10-CM | POA: Diagnosis not present

## 2018-03-31 DIAGNOSIS — E46 Unspecified protein-calorie malnutrition: Secondary | ICD-10-CM | POA: Diagnosis not present

## 2018-03-31 DIAGNOSIS — I509 Heart failure, unspecified: Secondary | ICD-10-CM | POA: Diagnosis not present

## 2018-03-31 DIAGNOSIS — G9009 Other idiopathic peripheral autonomic neuropathy: Secondary | ICD-10-CM | POA: Diagnosis not present

## 2018-03-31 DIAGNOSIS — I679 Cerebrovascular disease, unspecified: Secondary | ICD-10-CM | POA: Diagnosis not present

## 2018-04-01 ENCOUNTER — Encounter: Payer: Self-pay | Admitting: Family Medicine

## 2018-04-01 NOTE — Progress Notes (Signed)
Rest home visit on [facility].  Patient was seen in rest home with family.  Nurses consulted.  Oral intake improving but patient would like to try some solid food.  Hospice somewhat reluctant with history of dysphasia.  Cough is improved.  Minimal now.  No obvious wheezing.  No fever.  Mental status is improving happier more content  Vital signs stable.  Glucoses and blood pressures reviewed.  See the rest home chart alert Mytelase no acute distress lungs inspiratory clear no tachypnea heart regular rate and rhythm feet pulses intact abdominal exam benign  Impression status post pneumonia clinically improved discussed no further antibiotics rationale discussed  2.  Reactive airways clinically improved  3.  Nutrition suboptimum discussed we will try to advance to more textured foods rationale discussed  4.  Type 2 diabetes currently off medication.  A1c last obtain decent.  Glucose is currently decent  Follow-up in a couple weeks

## 2018-04-03 DIAGNOSIS — I509 Heart failure, unspecified: Secondary | ICD-10-CM | POA: Diagnosis not present

## 2018-04-03 DIAGNOSIS — I679 Cerebrovascular disease, unspecified: Secondary | ICD-10-CM | POA: Diagnosis not present

## 2018-04-03 DIAGNOSIS — E46 Unspecified protein-calorie malnutrition: Secondary | ICD-10-CM | POA: Diagnosis not present

## 2018-04-03 DIAGNOSIS — G9009 Other idiopathic peripheral autonomic neuropathy: Secondary | ICD-10-CM | POA: Diagnosis not present

## 2018-04-03 DIAGNOSIS — I4891 Unspecified atrial fibrillation: Secondary | ICD-10-CM | POA: Diagnosis not present

## 2018-04-03 DIAGNOSIS — I739 Peripheral vascular disease, unspecified: Secondary | ICD-10-CM | POA: Diagnosis not present

## 2018-04-04 DIAGNOSIS — I739 Peripheral vascular disease, unspecified: Secondary | ICD-10-CM | POA: Diagnosis not present

## 2018-04-04 DIAGNOSIS — I4891 Unspecified atrial fibrillation: Secondary | ICD-10-CM | POA: Diagnosis not present

## 2018-04-04 DIAGNOSIS — I509 Heart failure, unspecified: Secondary | ICD-10-CM | POA: Diagnosis not present

## 2018-04-04 DIAGNOSIS — G9009 Other idiopathic peripheral autonomic neuropathy: Secondary | ICD-10-CM | POA: Diagnosis not present

## 2018-04-04 DIAGNOSIS — E46 Unspecified protein-calorie malnutrition: Secondary | ICD-10-CM | POA: Diagnosis not present

## 2018-04-04 DIAGNOSIS — I679 Cerebrovascular disease, unspecified: Secondary | ICD-10-CM | POA: Diagnosis not present

## 2018-04-05 ENCOUNTER — Telehealth: Payer: Self-pay | Admitting: Family Medicine

## 2018-04-05 ENCOUNTER — Other Ambulatory Visit: Payer: Self-pay

## 2018-04-05 DIAGNOSIS — I509 Heart failure, unspecified: Secondary | ICD-10-CM | POA: Diagnosis not present

## 2018-04-05 DIAGNOSIS — I679 Cerebrovascular disease, unspecified: Secondary | ICD-10-CM | POA: Diagnosis not present

## 2018-04-05 DIAGNOSIS — E46 Unspecified protein-calorie malnutrition: Secondary | ICD-10-CM | POA: Diagnosis not present

## 2018-04-05 DIAGNOSIS — G9009 Other idiopathic peripheral autonomic neuropathy: Secondary | ICD-10-CM | POA: Diagnosis not present

## 2018-04-05 DIAGNOSIS — I4891 Unspecified atrial fibrillation: Secondary | ICD-10-CM | POA: Diagnosis not present

## 2018-04-05 DIAGNOSIS — I739 Peripheral vascular disease, unspecified: Secondary | ICD-10-CM | POA: Diagnosis not present

## 2018-04-05 MED ORDER — FINASTERIDE 5 MG PO TABS: 5.0000 mg | ORAL_TABLET | Freq: Every day | ORAL | 4 refills | Status: AC

## 2018-04-05 NOTE — Telephone Encounter (Signed)
Sent in meds to pt pharmacy and contacted [facility] to make them aware. Nurse needs refill request sent over, will fax.

## 2018-04-05 NOTE — Telephone Encounter (Signed)
Request from [facility] to have 90 day supply of Finasteride 5 mg. This med was last filled on 02/07/2018. Sent new script to patients pharmacy. Please advise

## 2018-04-05 NOTE — Telephone Encounter (Signed)
Ok one yrs worth 

## 2018-04-07 DIAGNOSIS — I739 Peripheral vascular disease, unspecified: Secondary | ICD-10-CM | POA: Diagnosis not present

## 2018-04-07 DIAGNOSIS — I509 Heart failure, unspecified: Secondary | ICD-10-CM | POA: Diagnosis not present

## 2018-04-07 DIAGNOSIS — I4891 Unspecified atrial fibrillation: Secondary | ICD-10-CM | POA: Diagnosis not present

## 2018-04-07 DIAGNOSIS — I679 Cerebrovascular disease, unspecified: Secondary | ICD-10-CM | POA: Diagnosis not present

## 2018-04-07 DIAGNOSIS — G9009 Other idiopathic peripheral autonomic neuropathy: Secondary | ICD-10-CM | POA: Diagnosis not present

## 2018-04-07 DIAGNOSIS — E46 Unspecified protein-calorie malnutrition: Secondary | ICD-10-CM | POA: Diagnosis not present

## 2018-04-12 DIAGNOSIS — G9009 Other idiopathic peripheral autonomic neuropathy: Secondary | ICD-10-CM | POA: Diagnosis not present

## 2018-04-12 DIAGNOSIS — I509 Heart failure, unspecified: Secondary | ICD-10-CM | POA: Diagnosis not present

## 2018-04-12 DIAGNOSIS — I4891 Unspecified atrial fibrillation: Secondary | ICD-10-CM | POA: Diagnosis not present

## 2018-04-12 DIAGNOSIS — E46 Unspecified protein-calorie malnutrition: Secondary | ICD-10-CM | POA: Diagnosis not present

## 2018-04-12 DIAGNOSIS — I739 Peripheral vascular disease, unspecified: Secondary | ICD-10-CM | POA: Diagnosis not present

## 2018-04-12 DIAGNOSIS — I679 Cerebrovascular disease, unspecified: Secondary | ICD-10-CM | POA: Diagnosis not present

## 2018-04-13 ENCOUNTER — Encounter: Admitting: Family Medicine

## 2018-04-13 DIAGNOSIS — E46 Unspecified protein-calorie malnutrition: Secondary | ICD-10-CM | POA: Diagnosis not present

## 2018-04-13 DIAGNOSIS — I679 Cerebrovascular disease, unspecified: Secondary | ICD-10-CM | POA: Diagnosis not present

## 2018-04-13 DIAGNOSIS — I509 Heart failure, unspecified: Secondary | ICD-10-CM | POA: Diagnosis not present

## 2018-04-13 DIAGNOSIS — G9009 Other idiopathic peripheral autonomic neuropathy: Secondary | ICD-10-CM | POA: Diagnosis not present

## 2018-04-13 DIAGNOSIS — I739 Peripheral vascular disease, unspecified: Secondary | ICD-10-CM | POA: Diagnosis not present

## 2018-04-13 DIAGNOSIS — I4891 Unspecified atrial fibrillation: Secondary | ICD-10-CM | POA: Diagnosis not present

## 2018-04-14 ENCOUNTER — Telehealth: Payer: Self-pay | Admitting: Family Medicine

## 2018-04-14 DIAGNOSIS — G9009 Other idiopathic peripheral autonomic neuropathy: Secondary | ICD-10-CM | POA: Diagnosis not present

## 2018-04-14 DIAGNOSIS — I509 Heart failure, unspecified: Secondary | ICD-10-CM | POA: Diagnosis not present

## 2018-04-14 DIAGNOSIS — E46 Unspecified protein-calorie malnutrition: Secondary | ICD-10-CM | POA: Diagnosis not present

## 2018-04-14 DIAGNOSIS — I679 Cerebrovascular disease, unspecified: Secondary | ICD-10-CM | POA: Diagnosis not present

## 2018-04-14 DIAGNOSIS — J811 Chronic pulmonary edema: Secondary | ICD-10-CM | POA: Diagnosis not present

## 2018-04-14 DIAGNOSIS — I4891 Unspecified atrial fibrillation: Secondary | ICD-10-CM | POA: Diagnosis not present

## 2018-04-14 DIAGNOSIS — I739 Peripheral vascular disease, unspecified: Secondary | ICD-10-CM | POA: Diagnosis not present

## 2018-04-14 NOTE — Telephone Encounter (Signed)
Review chest x-ray results from [facility] in results folder.

## 2018-04-14 NOTE — Telephone Encounter (Signed)
Advise [facility] we saw results orders as written yesterday will cover patchy pneumonia if this truly represents poneumonia

## 2018-04-14 NOTE — Telephone Encounter (Signed)
Christian Thomas at [facility] - Dr Richardson Landry saw results of CXR- orders as written yesterday will cover patchy pneumonia if this truly represents pneumonia-[facility] verbalized understanding.

## 2018-04-15 DIAGNOSIS — I509 Heart failure, unspecified: Secondary | ICD-10-CM | POA: Diagnosis not present

## 2018-04-15 DIAGNOSIS — R634 Abnormal weight loss: Secondary | ICD-10-CM | POA: Diagnosis not present

## 2018-04-15 DIAGNOSIS — R54 Age-related physical debility: Secondary | ICD-10-CM | POA: Diagnosis not present

## 2018-04-15 DIAGNOSIS — E1159 Type 2 diabetes mellitus with other circulatory complications: Secondary | ICD-10-CM | POA: Diagnosis not present

## 2018-04-15 DIAGNOSIS — I4891 Unspecified atrial fibrillation: Secondary | ICD-10-CM | POA: Diagnosis not present

## 2018-04-15 DIAGNOSIS — G9009 Other idiopathic peripheral autonomic neuropathy: Secondary | ICD-10-CM | POA: Diagnosis not present

## 2018-04-15 DIAGNOSIS — R1310 Dysphagia, unspecified: Secondary | ICD-10-CM | POA: Diagnosis not present

## 2018-04-15 DIAGNOSIS — J309 Allergic rhinitis, unspecified: Secondary | ICD-10-CM | POA: Diagnosis not present

## 2018-04-15 DIAGNOSIS — I739 Peripheral vascular disease, unspecified: Secondary | ICD-10-CM | POA: Diagnosis not present

## 2018-04-15 DIAGNOSIS — N401 Enlarged prostate with lower urinary tract symptoms: Secondary | ICD-10-CM | POA: Diagnosis not present

## 2018-04-15 DIAGNOSIS — I679 Cerebrovascular disease, unspecified: Secondary | ICD-10-CM | POA: Diagnosis not present

## 2018-04-15 DIAGNOSIS — E785 Hyperlipidemia, unspecified: Secondary | ICD-10-CM | POA: Diagnosis not present

## 2018-04-15 DIAGNOSIS — M109 Gout, unspecified: Secondary | ICD-10-CM | POA: Diagnosis not present

## 2018-04-15 DIAGNOSIS — I10 Essential (primary) hypertension: Secondary | ICD-10-CM | POA: Diagnosis not present

## 2018-04-15 DIAGNOSIS — E46 Unspecified protein-calorie malnutrition: Secondary | ICD-10-CM | POA: Diagnosis not present

## 2018-04-17 DIAGNOSIS — I509 Heart failure, unspecified: Secondary | ICD-10-CM | POA: Diagnosis not present

## 2018-04-17 DIAGNOSIS — I739 Peripheral vascular disease, unspecified: Secondary | ICD-10-CM | POA: Diagnosis not present

## 2018-04-17 DIAGNOSIS — I4891 Unspecified atrial fibrillation: Secondary | ICD-10-CM | POA: Diagnosis not present

## 2018-04-17 DIAGNOSIS — I679 Cerebrovascular disease, unspecified: Secondary | ICD-10-CM | POA: Diagnosis not present

## 2018-04-17 DIAGNOSIS — E46 Unspecified protein-calorie malnutrition: Secondary | ICD-10-CM | POA: Diagnosis not present

## 2018-04-17 DIAGNOSIS — G9009 Other idiopathic peripheral autonomic neuropathy: Secondary | ICD-10-CM | POA: Diagnosis not present

## 2018-04-18 DIAGNOSIS — I739 Peripheral vascular disease, unspecified: Secondary | ICD-10-CM | POA: Diagnosis not present

## 2018-04-18 DIAGNOSIS — G9009 Other idiopathic peripheral autonomic neuropathy: Secondary | ICD-10-CM | POA: Diagnosis not present

## 2018-04-18 DIAGNOSIS — I4891 Unspecified atrial fibrillation: Secondary | ICD-10-CM | POA: Diagnosis not present

## 2018-04-18 DIAGNOSIS — E46 Unspecified protein-calorie malnutrition: Secondary | ICD-10-CM | POA: Diagnosis not present

## 2018-04-18 DIAGNOSIS — I509 Heart failure, unspecified: Secondary | ICD-10-CM | POA: Diagnosis not present

## 2018-04-18 DIAGNOSIS — I679 Cerebrovascular disease, unspecified: Secondary | ICD-10-CM | POA: Diagnosis not present

## 2018-04-19 DIAGNOSIS — I739 Peripheral vascular disease, unspecified: Secondary | ICD-10-CM | POA: Diagnosis not present

## 2018-04-19 DIAGNOSIS — E46 Unspecified protein-calorie malnutrition: Secondary | ICD-10-CM | POA: Diagnosis not present

## 2018-04-19 DIAGNOSIS — I4891 Unspecified atrial fibrillation: Secondary | ICD-10-CM | POA: Diagnosis not present

## 2018-04-19 DIAGNOSIS — I509 Heart failure, unspecified: Secondary | ICD-10-CM | POA: Diagnosis not present

## 2018-04-19 DIAGNOSIS — I679 Cerebrovascular disease, unspecified: Secondary | ICD-10-CM | POA: Diagnosis not present

## 2018-04-19 DIAGNOSIS — G9009 Other idiopathic peripheral autonomic neuropathy: Secondary | ICD-10-CM | POA: Diagnosis not present

## 2018-04-20 ENCOUNTER — Encounter: Admitting: Family Medicine

## 2018-04-20 DIAGNOSIS — I4891 Unspecified atrial fibrillation: Secondary | ICD-10-CM | POA: Diagnosis not present

## 2018-04-20 DIAGNOSIS — I509 Heart failure, unspecified: Secondary | ICD-10-CM | POA: Diagnosis not present

## 2018-04-20 DIAGNOSIS — E46 Unspecified protein-calorie malnutrition: Secondary | ICD-10-CM | POA: Diagnosis not present

## 2018-04-20 DIAGNOSIS — G9009 Other idiopathic peripheral autonomic neuropathy: Secondary | ICD-10-CM | POA: Diagnosis not present

## 2018-04-20 DIAGNOSIS — I739 Peripheral vascular disease, unspecified: Secondary | ICD-10-CM | POA: Diagnosis not present

## 2018-04-20 DIAGNOSIS — I679 Cerebrovascular disease, unspecified: Secondary | ICD-10-CM | POA: Diagnosis not present

## 2018-04-21 ENCOUNTER — Telehealth: Payer: Self-pay | Admitting: *Deleted

## 2018-04-21 DIAGNOSIS — I4891 Unspecified atrial fibrillation: Secondary | ICD-10-CM | POA: Diagnosis not present

## 2018-04-21 DIAGNOSIS — G9009 Other idiopathic peripheral autonomic neuropathy: Secondary | ICD-10-CM | POA: Diagnosis not present

## 2018-04-21 DIAGNOSIS — I509 Heart failure, unspecified: Secondary | ICD-10-CM | POA: Diagnosis not present

## 2018-04-21 DIAGNOSIS — E46 Unspecified protein-calorie malnutrition: Secondary | ICD-10-CM | POA: Diagnosis not present

## 2018-04-21 DIAGNOSIS — I739 Peripheral vascular disease, unspecified: Secondary | ICD-10-CM | POA: Diagnosis not present

## 2018-04-21 DIAGNOSIS — I679 Cerebrovascular disease, unspecified: Secondary | ICD-10-CM | POA: Diagnosis not present

## 2018-04-21 NOTE — Telephone Encounter (Signed)
[name] called asking if Dr Wolfgang Phoenix could write an order for patient's food not to be chopped anymore because he is eating fine. Please advise [phone]

## 2018-04-21 NOTE — Telephone Encounter (Signed)
[facility] called back and needs form signed and faxed back. Form has been signed and faxed

## 2018-04-21 NOTE — Telephone Encounter (Signed)
Sure call in order not to chop food

## 2018-04-24 DIAGNOSIS — E46 Unspecified protein-calorie malnutrition: Secondary | ICD-10-CM | POA: Diagnosis not present

## 2018-04-24 DIAGNOSIS — I4891 Unspecified atrial fibrillation: Secondary | ICD-10-CM | POA: Diagnosis not present

## 2018-04-24 DIAGNOSIS — I679 Cerebrovascular disease, unspecified: Secondary | ICD-10-CM | POA: Diagnosis not present

## 2018-04-24 DIAGNOSIS — I739 Peripheral vascular disease, unspecified: Secondary | ICD-10-CM | POA: Diagnosis not present

## 2018-04-24 DIAGNOSIS — G9009 Other idiopathic peripheral autonomic neuropathy: Secondary | ICD-10-CM | POA: Diagnosis not present

## 2018-04-24 DIAGNOSIS — I509 Heart failure, unspecified: Secondary | ICD-10-CM | POA: Diagnosis not present

## 2018-04-26 DIAGNOSIS — I4891 Unspecified atrial fibrillation: Secondary | ICD-10-CM | POA: Diagnosis not present

## 2018-04-26 DIAGNOSIS — I679 Cerebrovascular disease, unspecified: Secondary | ICD-10-CM | POA: Diagnosis not present

## 2018-04-26 DIAGNOSIS — G9009 Other idiopathic peripheral autonomic neuropathy: Secondary | ICD-10-CM | POA: Diagnosis not present

## 2018-04-26 DIAGNOSIS — I509 Heart failure, unspecified: Secondary | ICD-10-CM | POA: Diagnosis not present

## 2018-04-26 DIAGNOSIS — E46 Unspecified protein-calorie malnutrition: Secondary | ICD-10-CM | POA: Diagnosis not present

## 2018-04-26 DIAGNOSIS — I739 Peripheral vascular disease, unspecified: Secondary | ICD-10-CM | POA: Diagnosis not present

## 2018-04-27 ENCOUNTER — Ambulatory Visit: Payer: Medicare Other | Admitting: Family Medicine

## 2018-04-27 ENCOUNTER — Telehealth: Payer: Self-pay | Admitting: Family Medicine

## 2018-04-27 NOTE — Telephone Encounter (Signed)
Christian Thomas is aware. 

## 2018-04-27 NOTE — Telephone Encounter (Signed)
[name] is calling to see if Dr. Richardson Landry is coming to see pt today or tomorrow on lunch. CB# [phone]

## 2018-04-28 DIAGNOSIS — I4891 Unspecified atrial fibrillation: Secondary | ICD-10-CM | POA: Diagnosis not present

## 2018-04-28 DIAGNOSIS — E46 Unspecified protein-calorie malnutrition: Secondary | ICD-10-CM | POA: Diagnosis not present

## 2018-04-28 DIAGNOSIS — I679 Cerebrovascular disease, unspecified: Secondary | ICD-10-CM | POA: Diagnosis not present

## 2018-04-28 DIAGNOSIS — I509 Heart failure, unspecified: Secondary | ICD-10-CM | POA: Diagnosis not present

## 2018-04-28 DIAGNOSIS — I739 Peripheral vascular disease, unspecified: Secondary | ICD-10-CM | POA: Diagnosis not present

## 2018-04-28 DIAGNOSIS — G9009 Other idiopathic peripheral autonomic neuropathy: Secondary | ICD-10-CM | POA: Diagnosis not present

## 2018-05-01 DIAGNOSIS — I739 Peripheral vascular disease, unspecified: Secondary | ICD-10-CM | POA: Diagnosis not present

## 2018-05-01 DIAGNOSIS — I679 Cerebrovascular disease, unspecified: Secondary | ICD-10-CM | POA: Diagnosis not present

## 2018-05-01 DIAGNOSIS — E46 Unspecified protein-calorie malnutrition: Secondary | ICD-10-CM | POA: Diagnosis not present

## 2018-05-01 DIAGNOSIS — I509 Heart failure, unspecified: Secondary | ICD-10-CM | POA: Diagnosis not present

## 2018-05-01 DIAGNOSIS — I4891 Unspecified atrial fibrillation: Secondary | ICD-10-CM | POA: Diagnosis not present

## 2018-05-01 DIAGNOSIS — G9009 Other idiopathic peripheral autonomic neuropathy: Secondary | ICD-10-CM | POA: Diagnosis not present

## 2018-05-02 DIAGNOSIS — I4891 Unspecified atrial fibrillation: Secondary | ICD-10-CM | POA: Diagnosis not present

## 2018-05-02 DIAGNOSIS — G9009 Other idiopathic peripheral autonomic neuropathy: Secondary | ICD-10-CM | POA: Diagnosis not present

## 2018-05-02 DIAGNOSIS — I739 Peripheral vascular disease, unspecified: Secondary | ICD-10-CM | POA: Diagnosis not present

## 2018-05-02 DIAGNOSIS — E46 Unspecified protein-calorie malnutrition: Secondary | ICD-10-CM | POA: Diagnosis not present

## 2018-05-02 DIAGNOSIS — I679 Cerebrovascular disease, unspecified: Secondary | ICD-10-CM | POA: Diagnosis not present

## 2018-05-02 DIAGNOSIS — I509 Heart failure, unspecified: Secondary | ICD-10-CM | POA: Diagnosis not present

## 2018-05-03 DIAGNOSIS — I509 Heart failure, unspecified: Secondary | ICD-10-CM | POA: Diagnosis not present

## 2018-05-03 DIAGNOSIS — E46 Unspecified protein-calorie malnutrition: Secondary | ICD-10-CM | POA: Diagnosis not present

## 2018-05-03 DIAGNOSIS — I679 Cerebrovascular disease, unspecified: Secondary | ICD-10-CM | POA: Diagnosis not present

## 2018-05-03 DIAGNOSIS — I739 Peripheral vascular disease, unspecified: Secondary | ICD-10-CM | POA: Diagnosis not present

## 2018-05-03 DIAGNOSIS — G9009 Other idiopathic peripheral autonomic neuropathy: Secondary | ICD-10-CM | POA: Diagnosis not present

## 2018-05-03 DIAGNOSIS — I4891 Unspecified atrial fibrillation: Secondary | ICD-10-CM | POA: Diagnosis not present

## 2018-05-04 ENCOUNTER — Ambulatory Visit: Payer: Medicare Other | Admitting: Family Medicine

## 2018-05-04 DIAGNOSIS — J189 Pneumonia, unspecified organism: Secondary | ICD-10-CM

## 2018-05-04 DIAGNOSIS — E118 Type 2 diabetes mellitus with unspecified complications: Secondary | ICD-10-CM

## 2018-05-04 DIAGNOSIS — R531 Weakness: Secondary | ICD-10-CM

## 2018-05-05 DIAGNOSIS — I4891 Unspecified atrial fibrillation: Secondary | ICD-10-CM | POA: Diagnosis not present

## 2018-05-05 DIAGNOSIS — I679 Cerebrovascular disease, unspecified: Secondary | ICD-10-CM | POA: Diagnosis not present

## 2018-05-05 DIAGNOSIS — I739 Peripheral vascular disease, unspecified: Secondary | ICD-10-CM | POA: Diagnosis not present

## 2018-05-05 DIAGNOSIS — E46 Unspecified protein-calorie malnutrition: Secondary | ICD-10-CM | POA: Diagnosis not present

## 2018-05-05 DIAGNOSIS — I509 Heart failure, unspecified: Secondary | ICD-10-CM | POA: Diagnosis not present

## 2018-05-05 DIAGNOSIS — G9009 Other idiopathic peripheral autonomic neuropathy: Secondary | ICD-10-CM | POA: Diagnosis not present

## 2018-05-06 DIAGNOSIS — J811 Chronic pulmonary edema: Secondary | ICD-10-CM | POA: Diagnosis not present

## 2018-05-07 ENCOUNTER — Encounter: Payer: Self-pay | Admitting: Family Medicine

## 2018-05-07 NOTE — Progress Notes (Signed)
Patient seen at nursing center.  Please see chart therefore vitals.  Continues to have substantial cough.  No fever.  Appetite fair.  Hospice still working with patient.  Patient still hopeful for improvement.  Utilize nebulizer 4 times daily.  Has come off antibiotics  Vitals stable  Alert active somewhat weak appearing no acute distress intermittent cough during exam rare wheeze with expiration no tachypnea no inspiratory crackles but productive cough noted.  Abdomen benign feet sensation absent pulses diminished but present no focal deficit  Impression #1 persistent cough bronchial findings we will repeat a chest x-ray rationale discussed with family  2.  Ongoing neuropathic pain.  3.  On blood work appetite.  Discussed.  Patient utilizing supplement  Further recommendations based on x-ray results.  We will follow-up and recheck patient in 2 weeks.  Of note patient also has diabetes.  Review glucoses shows good control.  Review of blood pressures also show good control  Greater than 50% of this 40 minute face to face visit was spent in counseling and discussion and coordination of care regarding the above diagnosis/diagnosies

## 2018-05-08 DIAGNOSIS — G9009 Other idiopathic peripheral autonomic neuropathy: Secondary | ICD-10-CM | POA: Diagnosis not present

## 2018-05-08 DIAGNOSIS — I679 Cerebrovascular disease, unspecified: Secondary | ICD-10-CM | POA: Diagnosis not present

## 2018-05-08 DIAGNOSIS — I509 Heart failure, unspecified: Secondary | ICD-10-CM | POA: Diagnosis not present

## 2018-05-08 DIAGNOSIS — I4891 Unspecified atrial fibrillation: Secondary | ICD-10-CM | POA: Diagnosis not present

## 2018-05-08 DIAGNOSIS — E46 Unspecified protein-calorie malnutrition: Secondary | ICD-10-CM | POA: Diagnosis not present

## 2018-05-08 DIAGNOSIS — I739 Peripheral vascular disease, unspecified: Secondary | ICD-10-CM | POA: Diagnosis not present

## 2018-05-09 ENCOUNTER — Other Ambulatory Visit: Payer: Self-pay | Admitting: *Deleted

## 2018-05-09 DIAGNOSIS — I4891 Unspecified atrial fibrillation: Secondary | ICD-10-CM | POA: Diagnosis not present

## 2018-05-09 DIAGNOSIS — G9009 Other idiopathic peripheral autonomic neuropathy: Secondary | ICD-10-CM | POA: Diagnosis not present

## 2018-05-09 DIAGNOSIS — I509 Heart failure, unspecified: Secondary | ICD-10-CM | POA: Diagnosis not present

## 2018-05-09 DIAGNOSIS — E46 Unspecified protein-calorie malnutrition: Secondary | ICD-10-CM | POA: Diagnosis not present

## 2018-05-09 DIAGNOSIS — I739 Peripheral vascular disease, unspecified: Secondary | ICD-10-CM | POA: Diagnosis not present

## 2018-05-09 DIAGNOSIS — I679 Cerebrovascular disease, unspecified: Secondary | ICD-10-CM | POA: Diagnosis not present

## 2018-05-09 MED ORDER — AMOXICILLIN 500 MG PO CAPS: 500.0000 mg | ORAL_CAPSULE | Freq: Three times a day (TID) | ORAL | 0 refills | Status: AC

## 2018-05-10 DIAGNOSIS — G9009 Other idiopathic peripheral autonomic neuropathy: Secondary | ICD-10-CM | POA: Diagnosis not present

## 2018-05-10 DIAGNOSIS — I509 Heart failure, unspecified: Secondary | ICD-10-CM | POA: Diagnosis not present

## 2018-05-10 DIAGNOSIS — E46 Unspecified protein-calorie malnutrition: Secondary | ICD-10-CM | POA: Diagnosis not present

## 2018-05-10 DIAGNOSIS — I739 Peripheral vascular disease, unspecified: Secondary | ICD-10-CM | POA: Diagnosis not present

## 2018-05-10 DIAGNOSIS — I679 Cerebrovascular disease, unspecified: Secondary | ICD-10-CM | POA: Diagnosis not present

## 2018-05-10 DIAGNOSIS — I4891 Unspecified atrial fibrillation: Secondary | ICD-10-CM | POA: Diagnosis not present

## 2018-05-12 DIAGNOSIS — I739 Peripheral vascular disease, unspecified: Secondary | ICD-10-CM | POA: Diagnosis not present

## 2018-05-12 DIAGNOSIS — E46 Unspecified protein-calorie malnutrition: Secondary | ICD-10-CM | POA: Diagnosis not present

## 2018-05-12 DIAGNOSIS — I4891 Unspecified atrial fibrillation: Secondary | ICD-10-CM | POA: Diagnosis not present

## 2018-05-12 DIAGNOSIS — I509 Heart failure, unspecified: Secondary | ICD-10-CM | POA: Diagnosis not present

## 2018-05-12 DIAGNOSIS — G9009 Other idiopathic peripheral autonomic neuropathy: Secondary | ICD-10-CM | POA: Diagnosis not present

## 2018-05-12 DIAGNOSIS — I679 Cerebrovascular disease, unspecified: Secondary | ICD-10-CM | POA: Diagnosis not present

## 2018-05-15 ENCOUNTER — Telehealth: Payer: Self-pay | Admitting: Family Medicine

## 2018-05-15 DIAGNOSIS — N401 Enlarged prostate with lower urinary tract symptoms: Secondary | ICD-10-CM | POA: Diagnosis not present

## 2018-05-15 DIAGNOSIS — J309 Allergic rhinitis, unspecified: Secondary | ICD-10-CM | POA: Diagnosis not present

## 2018-05-15 DIAGNOSIS — I739 Peripheral vascular disease, unspecified: Secondary | ICD-10-CM | POA: Diagnosis not present

## 2018-05-15 DIAGNOSIS — G9009 Other idiopathic peripheral autonomic neuropathy: Secondary | ICD-10-CM | POA: Diagnosis not present

## 2018-05-15 DIAGNOSIS — I679 Cerebrovascular disease, unspecified: Secondary | ICD-10-CM | POA: Diagnosis not present

## 2018-05-15 DIAGNOSIS — M109 Gout, unspecified: Secondary | ICD-10-CM | POA: Diagnosis not present

## 2018-05-15 DIAGNOSIS — I4891 Unspecified atrial fibrillation: Secondary | ICD-10-CM | POA: Diagnosis not present

## 2018-05-15 DIAGNOSIS — R54 Age-related physical debility: Secondary | ICD-10-CM | POA: Diagnosis not present

## 2018-05-15 DIAGNOSIS — I10 Essential (primary) hypertension: Secondary | ICD-10-CM | POA: Diagnosis not present

## 2018-05-15 DIAGNOSIS — E785 Hyperlipidemia, unspecified: Secondary | ICD-10-CM | POA: Diagnosis not present

## 2018-05-15 DIAGNOSIS — E46 Unspecified protein-calorie malnutrition: Secondary | ICD-10-CM | POA: Diagnosis not present

## 2018-05-15 DIAGNOSIS — I509 Heart failure, unspecified: Secondary | ICD-10-CM | POA: Diagnosis not present

## 2018-05-15 DIAGNOSIS — R634 Abnormal weight loss: Secondary | ICD-10-CM | POA: Diagnosis not present

## 2018-05-15 DIAGNOSIS — E1159 Type 2 diabetes mellitus with other circulatory complications: Secondary | ICD-10-CM | POA: Diagnosis not present

## 2018-05-15 DIAGNOSIS — R1310 Dysphagia, unspecified: Secondary | ICD-10-CM | POA: Diagnosis not present

## 2018-05-15 NOTE — Telephone Encounter (Signed)
[name] wants to know what day that Dr. Richardson Landry is going to see Gershon Mussel this week?

## 2018-05-16 ENCOUNTER — Ambulatory Visit: Payer: Medicare Other | Admitting: Family Medicine

## 2018-05-16 DIAGNOSIS — M170 Bilateral primary osteoarthritis of knee: Secondary | ICD-10-CM | POA: Diagnosis not present

## 2018-05-16 DIAGNOSIS — R531 Weakness: Secondary | ICD-10-CM | POA: Diagnosis not present

## 2018-05-16 DIAGNOSIS — J189 Pneumonia, unspecified organism: Secondary | ICD-10-CM | POA: Diagnosis not present

## 2018-05-16 DIAGNOSIS — G9009 Other idiopathic peripheral autonomic neuropathy: Secondary | ICD-10-CM | POA: Diagnosis not present

## 2018-05-16 DIAGNOSIS — E46 Unspecified protein-calorie malnutrition: Secondary | ICD-10-CM | POA: Diagnosis not present

## 2018-05-16 DIAGNOSIS — I679 Cerebrovascular disease, unspecified: Secondary | ICD-10-CM | POA: Diagnosis not present

## 2018-05-16 DIAGNOSIS — I739 Peripheral vascular disease, unspecified: Secondary | ICD-10-CM | POA: Diagnosis not present

## 2018-05-16 DIAGNOSIS — I509 Heart failure, unspecified: Secondary | ICD-10-CM | POA: Diagnosis not present

## 2018-05-16 DIAGNOSIS — E118 Type 2 diabetes mellitus with unspecified complications: Secondary | ICD-10-CM | POA: Diagnosis not present

## 2018-05-16 DIAGNOSIS — I4891 Unspecified atrial fibrillation: Secondary | ICD-10-CM | POA: Diagnosis not present

## 2018-05-16 NOTE — Telephone Encounter (Signed)
Call joy I can see six or six 15 this eve or wed eve o whichever works better for them

## 2018-05-16 NOTE — Telephone Encounter (Signed)
Left message on Joys voicemail to return call

## 2018-05-16 NOTE — Telephone Encounter (Signed)
[name] stated that six this evening would be fine.

## 2018-05-16 NOTE — Telephone Encounter (Signed)
good

## 2018-05-17 ENCOUNTER — Other Ambulatory Visit: Payer: Self-pay | Admitting: *Deleted

## 2018-05-17 DIAGNOSIS — I4891 Unspecified atrial fibrillation: Secondary | ICD-10-CM | POA: Diagnosis not present

## 2018-05-17 DIAGNOSIS — I509 Heart failure, unspecified: Secondary | ICD-10-CM | POA: Diagnosis not present

## 2018-05-17 DIAGNOSIS — I679 Cerebrovascular disease, unspecified: Secondary | ICD-10-CM | POA: Diagnosis not present

## 2018-05-17 DIAGNOSIS — I739 Peripheral vascular disease, unspecified: Secondary | ICD-10-CM | POA: Diagnosis not present

## 2018-05-17 DIAGNOSIS — G9009 Other idiopathic peripheral autonomic neuropathy: Secondary | ICD-10-CM | POA: Diagnosis not present

## 2018-05-17 DIAGNOSIS — E46 Unspecified protein-calorie malnutrition: Secondary | ICD-10-CM | POA: Diagnosis not present

## 2018-05-17 MED ORDER — METFORMIN HCL 500 MG PO TABS: ORAL_TABLET | ORAL | 1 refills | Status: AC

## 2018-05-18 NOTE — Progress Notes (Signed)
Patient seen at rest home along with family members.  Reports overall feeling better.  Still having knee pain at times.  Notes cough has improved somewhat.  Still using breathing treatments.  No fever.  Family reports appetite off and on.  Unable to get out of bed now  Vitals stable blood pressures reviewed  Lungs clear.  Heart regular rate and rhythm.  Abdomen soft.  Feet sensation absent bilateral no obvious infection no skin breakdown  Impression hospice status with severe fragile condition discussed once again with him  2.  Recent pneumonia clinically stable now.  Will hold off on any further antibiotics.  Will maintain nebulizer due to history of substantial reactive airway disease.  3.  Chronic knee pain discussed with family will inject knees at next visit in couple weeks  4.  Patient asked when he will walk again I advised him I doubt that he will ever walk again

## 2018-05-19 DIAGNOSIS — I739 Peripheral vascular disease, unspecified: Secondary | ICD-10-CM | POA: Diagnosis not present

## 2018-05-19 DIAGNOSIS — I679 Cerebrovascular disease, unspecified: Secondary | ICD-10-CM | POA: Diagnosis not present

## 2018-05-19 DIAGNOSIS — E46 Unspecified protein-calorie malnutrition: Secondary | ICD-10-CM | POA: Diagnosis not present

## 2018-05-19 DIAGNOSIS — I509 Heart failure, unspecified: Secondary | ICD-10-CM | POA: Diagnosis not present

## 2018-05-19 DIAGNOSIS — I4891 Unspecified atrial fibrillation: Secondary | ICD-10-CM | POA: Diagnosis not present

## 2018-05-19 DIAGNOSIS — G9009 Other idiopathic peripheral autonomic neuropathy: Secondary | ICD-10-CM | POA: Diagnosis not present

## 2018-05-22 DIAGNOSIS — E46 Unspecified protein-calorie malnutrition: Secondary | ICD-10-CM | POA: Diagnosis not present

## 2018-05-22 DIAGNOSIS — I679 Cerebrovascular disease, unspecified: Secondary | ICD-10-CM | POA: Diagnosis not present

## 2018-05-22 DIAGNOSIS — I509 Heart failure, unspecified: Secondary | ICD-10-CM | POA: Diagnosis not present

## 2018-05-22 DIAGNOSIS — G9009 Other idiopathic peripheral autonomic neuropathy: Secondary | ICD-10-CM | POA: Diagnosis not present

## 2018-05-22 DIAGNOSIS — I4891 Unspecified atrial fibrillation: Secondary | ICD-10-CM | POA: Diagnosis not present

## 2018-05-22 DIAGNOSIS — I739 Peripheral vascular disease, unspecified: Secondary | ICD-10-CM | POA: Diagnosis not present

## 2018-05-23 DIAGNOSIS — I739 Peripheral vascular disease, unspecified: Secondary | ICD-10-CM | POA: Diagnosis not present

## 2018-05-23 DIAGNOSIS — I509 Heart failure, unspecified: Secondary | ICD-10-CM | POA: Diagnosis not present

## 2018-05-23 DIAGNOSIS — I679 Cerebrovascular disease, unspecified: Secondary | ICD-10-CM | POA: Diagnosis not present

## 2018-05-23 DIAGNOSIS — G9009 Other idiopathic peripheral autonomic neuropathy: Secondary | ICD-10-CM | POA: Diagnosis not present

## 2018-05-23 DIAGNOSIS — I4891 Unspecified atrial fibrillation: Secondary | ICD-10-CM | POA: Diagnosis not present

## 2018-05-23 DIAGNOSIS — E46 Unspecified protein-calorie malnutrition: Secondary | ICD-10-CM | POA: Diagnosis not present

## 2018-05-24 DIAGNOSIS — I679 Cerebrovascular disease, unspecified: Secondary | ICD-10-CM | POA: Diagnosis not present

## 2018-05-24 DIAGNOSIS — I509 Heart failure, unspecified: Secondary | ICD-10-CM | POA: Diagnosis not present

## 2018-05-24 DIAGNOSIS — I739 Peripheral vascular disease, unspecified: Secondary | ICD-10-CM | POA: Diagnosis not present

## 2018-05-24 DIAGNOSIS — E46 Unspecified protein-calorie malnutrition: Secondary | ICD-10-CM | POA: Diagnosis not present

## 2018-05-24 DIAGNOSIS — G9009 Other idiopathic peripheral autonomic neuropathy: Secondary | ICD-10-CM | POA: Diagnosis not present

## 2018-05-24 DIAGNOSIS — I4891 Unspecified atrial fibrillation: Secondary | ICD-10-CM | POA: Diagnosis not present

## 2018-05-26 DIAGNOSIS — I4891 Unspecified atrial fibrillation: Secondary | ICD-10-CM | POA: Diagnosis not present

## 2018-05-26 DIAGNOSIS — I679 Cerebrovascular disease, unspecified: Secondary | ICD-10-CM | POA: Diagnosis not present

## 2018-05-26 DIAGNOSIS — I509 Heart failure, unspecified: Secondary | ICD-10-CM | POA: Diagnosis not present

## 2018-05-26 DIAGNOSIS — I739 Peripheral vascular disease, unspecified: Secondary | ICD-10-CM | POA: Diagnosis not present

## 2018-05-26 DIAGNOSIS — G9009 Other idiopathic peripheral autonomic neuropathy: Secondary | ICD-10-CM | POA: Diagnosis not present

## 2018-05-26 DIAGNOSIS — E46 Unspecified protein-calorie malnutrition: Secondary | ICD-10-CM | POA: Diagnosis not present

## 2018-05-29 DIAGNOSIS — I4891 Unspecified atrial fibrillation: Secondary | ICD-10-CM | POA: Diagnosis not present

## 2018-05-29 DIAGNOSIS — I739 Peripheral vascular disease, unspecified: Secondary | ICD-10-CM | POA: Diagnosis not present

## 2018-05-29 DIAGNOSIS — E46 Unspecified protein-calorie malnutrition: Secondary | ICD-10-CM | POA: Diagnosis not present

## 2018-05-29 DIAGNOSIS — I509 Heart failure, unspecified: Secondary | ICD-10-CM | POA: Diagnosis not present

## 2018-05-29 DIAGNOSIS — I679 Cerebrovascular disease, unspecified: Secondary | ICD-10-CM | POA: Diagnosis not present

## 2018-05-29 DIAGNOSIS — G9009 Other idiopathic peripheral autonomic neuropathy: Secondary | ICD-10-CM | POA: Diagnosis not present

## 2018-05-30 DIAGNOSIS — E46 Unspecified protein-calorie malnutrition: Secondary | ICD-10-CM | POA: Diagnosis not present

## 2018-05-30 DIAGNOSIS — I679 Cerebrovascular disease, unspecified: Secondary | ICD-10-CM | POA: Diagnosis not present

## 2018-05-30 DIAGNOSIS — I509 Heart failure, unspecified: Secondary | ICD-10-CM | POA: Diagnosis not present

## 2018-05-30 DIAGNOSIS — G9009 Other idiopathic peripheral autonomic neuropathy: Secondary | ICD-10-CM | POA: Diagnosis not present

## 2018-05-30 DIAGNOSIS — I4891 Unspecified atrial fibrillation: Secondary | ICD-10-CM | POA: Diagnosis not present

## 2018-05-30 DIAGNOSIS — I739 Peripheral vascular disease, unspecified: Secondary | ICD-10-CM | POA: Diagnosis not present

## 2018-05-31 ENCOUNTER — Encounter: Admitting: Family Medicine

## 2018-05-31 DIAGNOSIS — I679 Cerebrovascular disease, unspecified: Secondary | ICD-10-CM | POA: Diagnosis not present

## 2018-05-31 DIAGNOSIS — I739 Peripheral vascular disease, unspecified: Secondary | ICD-10-CM | POA: Diagnosis not present

## 2018-05-31 DIAGNOSIS — I509 Heart failure, unspecified: Secondary | ICD-10-CM | POA: Diagnosis not present

## 2018-05-31 DIAGNOSIS — G9009 Other idiopathic peripheral autonomic neuropathy: Secondary | ICD-10-CM | POA: Diagnosis not present

## 2018-05-31 DIAGNOSIS — I4891 Unspecified atrial fibrillation: Secondary | ICD-10-CM | POA: Diagnosis not present

## 2018-05-31 DIAGNOSIS — E46 Unspecified protein-calorie malnutrition: Secondary | ICD-10-CM | POA: Diagnosis not present

## 2018-06-01 ENCOUNTER — Telehealth: Payer: Self-pay

## 2018-06-01 NOTE — Telephone Encounter (Signed)
Per Dr.Steve Luking call [name] and have them check pt temp Q day for seven days if normal may stop after seven days. I called and spoke with Levada Dy med tech whom asked that I fax the order over. Awaiting Dr.Signature to send to [facility].

## 2018-06-02 DIAGNOSIS — I4891 Unspecified atrial fibrillation: Secondary | ICD-10-CM | POA: Diagnosis not present

## 2018-06-02 DIAGNOSIS — G9009 Other idiopathic peripheral autonomic neuropathy: Secondary | ICD-10-CM | POA: Diagnosis not present

## 2018-06-02 DIAGNOSIS — I679 Cerebrovascular disease, unspecified: Secondary | ICD-10-CM | POA: Diagnosis not present

## 2018-06-02 DIAGNOSIS — I509 Heart failure, unspecified: Secondary | ICD-10-CM | POA: Diagnosis not present

## 2018-06-02 DIAGNOSIS — I739 Peripheral vascular disease, unspecified: Secondary | ICD-10-CM | POA: Diagnosis not present

## 2018-06-02 DIAGNOSIS — E46 Unspecified protein-calorie malnutrition: Secondary | ICD-10-CM | POA: Diagnosis not present

## 2018-06-02 NOTE — Telephone Encounter (Signed)
Order faxed to [facility] 

## 2018-06-05 ENCOUNTER — Telehealth: Payer: Self-pay | Admitting: *Deleted

## 2018-06-05 DIAGNOSIS — I4891 Unspecified atrial fibrillation: Secondary | ICD-10-CM | POA: Diagnosis not present

## 2018-06-05 DIAGNOSIS — E46 Unspecified protein-calorie malnutrition: Secondary | ICD-10-CM | POA: Diagnosis not present

## 2018-06-05 DIAGNOSIS — G9009 Other idiopathic peripheral autonomic neuropathy: Secondary | ICD-10-CM | POA: Diagnosis not present

## 2018-06-05 DIAGNOSIS — I509 Heart failure, unspecified: Secondary | ICD-10-CM | POA: Diagnosis not present

## 2018-06-05 DIAGNOSIS — I679 Cerebrovascular disease, unspecified: Secondary | ICD-10-CM | POA: Diagnosis not present

## 2018-06-05 DIAGNOSIS — I739 Peripheral vascular disease, unspecified: Secondary | ICD-10-CM | POA: Diagnosis not present

## 2018-06-05 NOTE — Telephone Encounter (Signed)
Ok cst

## 2018-06-05 NOTE — Telephone Encounter (Signed)
Michelle at [employer].

## 2018-06-05 NOTE — Telephone Encounter (Signed)
Fax from [facility] stating Christian Thomas said temp was 101, the recording from [facility] were all normal. Christian Thomas at [facility] and she said she did not know where the temp of 101 came from all their recordings were normal. Called Christian Thomas and she states that Saturday when she got there his face was red and she asked one of the ladies working to check his temp and that's when it was 101. They gave tylenol and it brought it down.   Right foot swelling. Christian Thomas and Christian Thomas both state it was not bad, not red, not too much swelling, no pain.   Not sure when last BM was. Christian Thomas said he does not get much time on the toliet. Giving senna 2 at bedtime and miralax as needed.

## 2018-06-06 ENCOUNTER — Other Ambulatory Visit: Payer: Self-pay | Admitting: *Deleted

## 2018-06-06 ENCOUNTER — Telehealth: Payer: Self-pay | Admitting: *Deleted

## 2018-06-06 DIAGNOSIS — I509 Heart failure, unspecified: Secondary | ICD-10-CM | POA: Diagnosis not present

## 2018-06-06 DIAGNOSIS — I4891 Unspecified atrial fibrillation: Secondary | ICD-10-CM | POA: Diagnosis not present

## 2018-06-06 DIAGNOSIS — G9009 Other idiopathic peripheral autonomic neuropathy: Secondary | ICD-10-CM | POA: Diagnosis not present

## 2018-06-06 DIAGNOSIS — E46 Unspecified protein-calorie malnutrition: Secondary | ICD-10-CM | POA: Diagnosis not present

## 2018-06-06 DIAGNOSIS — I679 Cerebrovascular disease, unspecified: Secondary | ICD-10-CM | POA: Diagnosis not present

## 2018-06-06 DIAGNOSIS — I739 Peripheral vascular disease, unspecified: Secondary | ICD-10-CM | POA: Diagnosis not present

## 2018-06-06 MED ORDER — POLYETHYLENE GLYCOL 3350 17 GM/SCOOP PO POWD: ORAL | 0 refills | Status: DC

## 2018-06-06 NOTE — Telephone Encounter (Signed)
daioly miralax fine, enema statement unclear, plz still give enema as ordered earlier tod

## 2018-06-06 NOTE — Telephone Encounter (Signed)
Called [facility] and spoke with med tech since hospice nurse had left. Orders faxed to [facility] and [facility] [place]

## 2018-06-06 NOTE — Telephone Encounter (Signed)
Order faxed. [name] notified. Tried to call [facility] no answer. Order faxed to [facility] and [facility] for enema.

## 2018-06-06 NOTE — Telephone Encounter (Signed)
Fleets enema , may give second in four hrs if no good respopnse

## 2018-06-06 NOTE — Telephone Encounter (Signed)
Not able to have a BM. Niece Caryl Asp would like an order for an enema. He has been straining but can't go. It has been 4 -5 days since last BM. Only has pain when he tries to go.

## 2018-06-06 NOTE — Telephone Encounter (Signed)
Hospice nurse Christian Thomas states family has said No bm since Thursday. But nurse states in his records states he had one Thursday and Friday. Pt gets anxious if he does not go every day.  Nurse would like miralax 17 grams mixed in 4 -8 ounces. changed from prn to daily. Hold if loose stools and enema order changed to prn. Bowel sounds are good.   Just fax order [facility] and [facility] [place] [facility] if dr Richardson Landry agrees.

## 2018-06-07 DIAGNOSIS — I739 Peripheral vascular disease, unspecified: Secondary | ICD-10-CM | POA: Diagnosis not present

## 2018-06-07 DIAGNOSIS — I679 Cerebrovascular disease, unspecified: Secondary | ICD-10-CM | POA: Diagnosis not present

## 2018-06-07 DIAGNOSIS — I4891 Unspecified atrial fibrillation: Secondary | ICD-10-CM | POA: Diagnosis not present

## 2018-06-07 DIAGNOSIS — I509 Heart failure, unspecified: Secondary | ICD-10-CM | POA: Diagnosis not present

## 2018-06-07 DIAGNOSIS — G9009 Other idiopathic peripheral autonomic neuropathy: Secondary | ICD-10-CM | POA: Diagnosis not present

## 2018-06-07 DIAGNOSIS — E46 Unspecified protein-calorie malnutrition: Secondary | ICD-10-CM | POA: Diagnosis not present

## 2018-06-08 DIAGNOSIS — L84 Corns and callosities: Secondary | ICD-10-CM | POA: Diagnosis not present

## 2018-06-08 DIAGNOSIS — E1151 Type 2 diabetes mellitus with diabetic peripheral angiopathy without gangrene: Secondary | ICD-10-CM | POA: Diagnosis not present

## 2018-06-09 DIAGNOSIS — I739 Peripheral vascular disease, unspecified: Secondary | ICD-10-CM | POA: Diagnosis not present

## 2018-06-09 DIAGNOSIS — I509 Heart failure, unspecified: Secondary | ICD-10-CM | POA: Diagnosis not present

## 2018-06-09 DIAGNOSIS — G9009 Other idiopathic peripheral autonomic neuropathy: Secondary | ICD-10-CM | POA: Diagnosis not present

## 2018-06-09 DIAGNOSIS — I4891 Unspecified atrial fibrillation: Secondary | ICD-10-CM | POA: Diagnosis not present

## 2018-06-09 DIAGNOSIS — I679 Cerebrovascular disease, unspecified: Secondary | ICD-10-CM | POA: Diagnosis not present

## 2018-06-09 DIAGNOSIS — E46 Unspecified protein-calorie malnutrition: Secondary | ICD-10-CM | POA: Diagnosis not present

## 2018-06-09 DIAGNOSIS — N390 Urinary tract infection, site not specified: Secondary | ICD-10-CM | POA: Diagnosis not present

## 2018-06-12 ENCOUNTER — Telehealth: Payer: Self-pay | Admitting: *Deleted

## 2018-06-12 DIAGNOSIS — E46 Unspecified protein-calorie malnutrition: Secondary | ICD-10-CM | POA: Diagnosis not present

## 2018-06-12 DIAGNOSIS — I679 Cerebrovascular disease, unspecified: Secondary | ICD-10-CM | POA: Diagnosis not present

## 2018-06-12 DIAGNOSIS — I4891 Unspecified atrial fibrillation: Secondary | ICD-10-CM | POA: Diagnosis not present

## 2018-06-12 DIAGNOSIS — I509 Heart failure, unspecified: Secondary | ICD-10-CM | POA: Diagnosis not present

## 2018-06-12 DIAGNOSIS — I739 Peripheral vascular disease, unspecified: Secondary | ICD-10-CM | POA: Diagnosis not present

## 2018-06-12 DIAGNOSIS — G9009 Other idiopathic peripheral autonomic neuropathy: Secondary | ICD-10-CM | POA: Diagnosis not present

## 2018-06-12 NOTE — Telephone Encounter (Signed)
Due to multiple drug-resistant issues there are no great choices.  I recommend Cipro 250 mg 1 twice daily for 5 days

## 2018-06-12 NOTE — Telephone Encounter (Signed)
Christian Thomas from [facility] calling to see if lab results from urine specimen have been reviewed. She states they requested this last week because he was having urinary frequency and agitation. He had a fever about one week ago. None since. He had a good weekend. No agitation. Results put in dr scott's folder to review.  Call [place] back [phone].

## 2018-06-13 NOTE — Telephone Encounter (Signed)
Spoke with Scientist, clinical (histocompatibility and immunogenetics). Christian Thomas stated the order needs to be faxed over. Script written out and awaiting signature. Christian Thomas stated she will call and update the family.

## 2018-06-14 DIAGNOSIS — G9009 Other idiopathic peripheral autonomic neuropathy: Secondary | ICD-10-CM | POA: Diagnosis not present

## 2018-06-14 DIAGNOSIS — I509 Heart failure, unspecified: Secondary | ICD-10-CM | POA: Diagnosis not present

## 2018-06-14 DIAGNOSIS — I739 Peripheral vascular disease, unspecified: Secondary | ICD-10-CM | POA: Diagnosis not present

## 2018-06-14 DIAGNOSIS — I679 Cerebrovascular disease, unspecified: Secondary | ICD-10-CM | POA: Diagnosis not present

## 2018-06-14 DIAGNOSIS — I4891 Unspecified atrial fibrillation: Secondary | ICD-10-CM | POA: Diagnosis not present

## 2018-06-14 DIAGNOSIS — E46 Unspecified protein-calorie malnutrition: Secondary | ICD-10-CM | POA: Diagnosis not present

## 2018-06-15 DIAGNOSIS — E785 Hyperlipidemia, unspecified: Secondary | ICD-10-CM | POA: Diagnosis not present

## 2018-06-15 DIAGNOSIS — R634 Abnormal weight loss: Secondary | ICD-10-CM | POA: Diagnosis not present

## 2018-06-15 DIAGNOSIS — R54 Age-related physical debility: Secondary | ICD-10-CM | POA: Diagnosis not present

## 2018-06-15 DIAGNOSIS — I4891 Unspecified atrial fibrillation: Secondary | ICD-10-CM | POA: Diagnosis not present

## 2018-06-15 DIAGNOSIS — N401 Enlarged prostate with lower urinary tract symptoms: Secondary | ICD-10-CM | POA: Diagnosis not present

## 2018-06-15 DIAGNOSIS — I509 Heart failure, unspecified: Secondary | ICD-10-CM | POA: Diagnosis not present

## 2018-06-15 DIAGNOSIS — M109 Gout, unspecified: Secondary | ICD-10-CM | POA: Diagnosis not present

## 2018-06-15 DIAGNOSIS — G9009 Other idiopathic peripheral autonomic neuropathy: Secondary | ICD-10-CM | POA: Diagnosis not present

## 2018-06-15 DIAGNOSIS — R1310 Dysphagia, unspecified: Secondary | ICD-10-CM | POA: Diagnosis not present

## 2018-06-15 DIAGNOSIS — I739 Peripheral vascular disease, unspecified: Secondary | ICD-10-CM | POA: Diagnosis not present

## 2018-06-15 DIAGNOSIS — E46 Unspecified protein-calorie malnutrition: Secondary | ICD-10-CM | POA: Diagnosis not present

## 2018-06-15 DIAGNOSIS — I679 Cerebrovascular disease, unspecified: Secondary | ICD-10-CM | POA: Diagnosis not present

## 2018-06-15 DIAGNOSIS — I10 Essential (primary) hypertension: Secondary | ICD-10-CM | POA: Diagnosis not present

## 2018-06-15 DIAGNOSIS — E1159 Type 2 diabetes mellitus with other circulatory complications: Secondary | ICD-10-CM | POA: Diagnosis not present

## 2018-06-15 DIAGNOSIS — J309 Allergic rhinitis, unspecified: Secondary | ICD-10-CM | POA: Diagnosis not present

## 2018-06-16 DIAGNOSIS — I4891 Unspecified atrial fibrillation: Secondary | ICD-10-CM | POA: Diagnosis not present

## 2018-06-16 DIAGNOSIS — I679 Cerebrovascular disease, unspecified: Secondary | ICD-10-CM | POA: Diagnosis not present

## 2018-06-16 DIAGNOSIS — G9009 Other idiopathic peripheral autonomic neuropathy: Secondary | ICD-10-CM | POA: Diagnosis not present

## 2018-06-16 DIAGNOSIS — E46 Unspecified protein-calorie malnutrition: Secondary | ICD-10-CM | POA: Diagnosis not present

## 2018-06-16 DIAGNOSIS — I509 Heart failure, unspecified: Secondary | ICD-10-CM | POA: Diagnosis not present

## 2018-06-16 DIAGNOSIS — I739 Peripheral vascular disease, unspecified: Secondary | ICD-10-CM | POA: Diagnosis not present

## 2018-06-19 DIAGNOSIS — I679 Cerebrovascular disease, unspecified: Secondary | ICD-10-CM | POA: Diagnosis not present

## 2018-06-19 DIAGNOSIS — G9009 Other idiopathic peripheral autonomic neuropathy: Secondary | ICD-10-CM | POA: Diagnosis not present

## 2018-06-19 DIAGNOSIS — I509 Heart failure, unspecified: Secondary | ICD-10-CM | POA: Diagnosis not present

## 2018-06-19 DIAGNOSIS — I739 Peripheral vascular disease, unspecified: Secondary | ICD-10-CM | POA: Diagnosis not present

## 2018-06-19 DIAGNOSIS — I4891 Unspecified atrial fibrillation: Secondary | ICD-10-CM | POA: Diagnosis not present

## 2018-06-19 DIAGNOSIS — E46 Unspecified protein-calorie malnutrition: Secondary | ICD-10-CM | POA: Diagnosis not present

## 2018-06-20 DIAGNOSIS — I679 Cerebrovascular disease, unspecified: Secondary | ICD-10-CM | POA: Diagnosis not present

## 2018-06-20 DIAGNOSIS — I4891 Unspecified atrial fibrillation: Secondary | ICD-10-CM | POA: Diagnosis not present

## 2018-06-20 DIAGNOSIS — I509 Heart failure, unspecified: Secondary | ICD-10-CM | POA: Diagnosis not present

## 2018-06-20 DIAGNOSIS — I739 Peripheral vascular disease, unspecified: Secondary | ICD-10-CM | POA: Diagnosis not present

## 2018-06-20 DIAGNOSIS — G9009 Other idiopathic peripheral autonomic neuropathy: Secondary | ICD-10-CM | POA: Diagnosis not present

## 2018-06-20 DIAGNOSIS — E46 Unspecified protein-calorie malnutrition: Secondary | ICD-10-CM | POA: Diagnosis not present

## 2018-06-21 ENCOUNTER — Ambulatory Visit: Payer: PRIVATE HEALTH INSURANCE | Admitting: Family Medicine

## 2018-06-21 DIAGNOSIS — I739 Peripheral vascular disease, unspecified: Secondary | ICD-10-CM | POA: Diagnosis not present

## 2018-06-21 DIAGNOSIS — I509 Heart failure, unspecified: Secondary | ICD-10-CM | POA: Diagnosis not present

## 2018-06-21 DIAGNOSIS — I679 Cerebrovascular disease, unspecified: Secondary | ICD-10-CM | POA: Diagnosis not present

## 2018-06-21 DIAGNOSIS — G9009 Other idiopathic peripheral autonomic neuropathy: Secondary | ICD-10-CM | POA: Diagnosis not present

## 2018-06-21 DIAGNOSIS — I4891 Unspecified atrial fibrillation: Secondary | ICD-10-CM | POA: Diagnosis not present

## 2018-06-21 DIAGNOSIS — E46 Unspecified protein-calorie malnutrition: Secondary | ICD-10-CM | POA: Diagnosis not present

## 2018-06-22 ENCOUNTER — Other Ambulatory Visit: Payer: Self-pay | Admitting: Family Medicine

## 2018-06-22 ENCOUNTER — Telehealth: Payer: Self-pay | Admitting: Family Medicine

## 2018-06-22 MED ORDER — LORATADINE 10 MG PO TABS: ORAL_TABLET | ORAL | 11 refills | Status: AC

## 2018-06-22 NOTE — Telephone Encounter (Signed)
Medication sent in and [name] made aware.

## 2018-06-22 NOTE — Progress Notes (Unsigned)
car

## 2018-06-22 NOTE — Telephone Encounter (Signed)
Christian Thomas is calling to request something like Claritin to be called in.  He is having issues with his nose being stopped up.  [name]

## 2018-06-22 NOTE — Telephone Encounter (Signed)
claritin 10 qhs 30 11 ref

## 2018-06-23 ENCOUNTER — Other Ambulatory Visit: Payer: Self-pay | Admitting: *Deleted

## 2018-06-23 ENCOUNTER — Telehealth: Payer: Self-pay | Admitting: *Deleted

## 2018-06-23 DIAGNOSIS — I4891 Unspecified atrial fibrillation: Secondary | ICD-10-CM | POA: Diagnosis not present

## 2018-06-23 DIAGNOSIS — G9009 Other idiopathic peripheral autonomic neuropathy: Secondary | ICD-10-CM | POA: Diagnosis not present

## 2018-06-23 DIAGNOSIS — I739 Peripheral vascular disease, unspecified: Secondary | ICD-10-CM | POA: Diagnosis not present

## 2018-06-23 DIAGNOSIS — E46 Unspecified protein-calorie malnutrition: Secondary | ICD-10-CM | POA: Diagnosis not present

## 2018-06-23 DIAGNOSIS — I679 Cerebrovascular disease, unspecified: Secondary | ICD-10-CM | POA: Diagnosis not present

## 2018-06-23 DIAGNOSIS — I509 Heart failure, unspecified: Secondary | ICD-10-CM | POA: Diagnosis not present

## 2018-06-23 MED ORDER — ALPRAZOLAM 0.25 MG PO TABS: 0.2500 mg | ORAL_TABLET | Freq: Two times a day (BID) | ORAL | 0 refills | Status: DC | PRN

## 2018-06-23 NOTE — Telephone Encounter (Signed)
Hard script for xanax faxed to [facility]. Fax number [phone].

## 2018-06-23 NOTE — Telephone Encounter (Signed)
wis 

## 2018-06-23 NOTE — Telephone Encounter (Signed)
Script printed await signature 

## 2018-06-23 NOTE — Telephone Encounter (Signed)
Fax from [facility] pt needs a new hard script for prn xanax asap. He is completely out.

## 2018-06-26 DIAGNOSIS — I739 Peripheral vascular disease, unspecified: Secondary | ICD-10-CM | POA: Diagnosis not present

## 2018-06-26 DIAGNOSIS — G9009 Other idiopathic peripheral autonomic neuropathy: Secondary | ICD-10-CM | POA: Diagnosis not present

## 2018-06-26 DIAGNOSIS — I679 Cerebrovascular disease, unspecified: Secondary | ICD-10-CM | POA: Diagnosis not present

## 2018-06-26 DIAGNOSIS — I509 Heart failure, unspecified: Secondary | ICD-10-CM | POA: Diagnosis not present

## 2018-06-26 DIAGNOSIS — E46 Unspecified protein-calorie malnutrition: Secondary | ICD-10-CM | POA: Diagnosis not present

## 2018-06-26 DIAGNOSIS — I4891 Unspecified atrial fibrillation: Secondary | ICD-10-CM | POA: Diagnosis not present

## 2018-06-28 DIAGNOSIS — G9009 Other idiopathic peripheral autonomic neuropathy: Secondary | ICD-10-CM | POA: Diagnosis not present

## 2018-06-28 DIAGNOSIS — I679 Cerebrovascular disease, unspecified: Secondary | ICD-10-CM | POA: Diagnosis not present

## 2018-06-28 DIAGNOSIS — I509 Heart failure, unspecified: Secondary | ICD-10-CM | POA: Diagnosis not present

## 2018-06-28 DIAGNOSIS — E46 Unspecified protein-calorie malnutrition: Secondary | ICD-10-CM | POA: Diagnosis not present

## 2018-06-28 DIAGNOSIS — I4891 Unspecified atrial fibrillation: Secondary | ICD-10-CM | POA: Diagnosis not present

## 2018-06-28 DIAGNOSIS — I739 Peripheral vascular disease, unspecified: Secondary | ICD-10-CM | POA: Diagnosis not present

## 2018-06-29 DIAGNOSIS — I679 Cerebrovascular disease, unspecified: Secondary | ICD-10-CM | POA: Diagnosis not present

## 2018-06-29 DIAGNOSIS — E46 Unspecified protein-calorie malnutrition: Secondary | ICD-10-CM | POA: Diagnosis not present

## 2018-06-29 DIAGNOSIS — I739 Peripheral vascular disease, unspecified: Secondary | ICD-10-CM | POA: Diagnosis not present

## 2018-06-29 DIAGNOSIS — I509 Heart failure, unspecified: Secondary | ICD-10-CM | POA: Diagnosis not present

## 2018-06-29 DIAGNOSIS — G9009 Other idiopathic peripheral autonomic neuropathy: Secondary | ICD-10-CM | POA: Diagnosis not present

## 2018-06-29 DIAGNOSIS — I4891 Unspecified atrial fibrillation: Secondary | ICD-10-CM | POA: Diagnosis not present

## 2018-06-30 DIAGNOSIS — I679 Cerebrovascular disease, unspecified: Secondary | ICD-10-CM | POA: Diagnosis not present

## 2018-06-30 DIAGNOSIS — I4891 Unspecified atrial fibrillation: Secondary | ICD-10-CM | POA: Diagnosis not present

## 2018-06-30 DIAGNOSIS — I739 Peripheral vascular disease, unspecified: Secondary | ICD-10-CM | POA: Diagnosis not present

## 2018-06-30 DIAGNOSIS — G9009 Other idiopathic peripheral autonomic neuropathy: Secondary | ICD-10-CM | POA: Diagnosis not present

## 2018-06-30 DIAGNOSIS — I509 Heart failure, unspecified: Secondary | ICD-10-CM | POA: Diagnosis not present

## 2018-06-30 DIAGNOSIS — E46 Unspecified protein-calorie malnutrition: Secondary | ICD-10-CM | POA: Diagnosis not present

## 2018-07-03 DIAGNOSIS — I4891 Unspecified atrial fibrillation: Secondary | ICD-10-CM | POA: Diagnosis not present

## 2018-07-03 DIAGNOSIS — E46 Unspecified protein-calorie malnutrition: Secondary | ICD-10-CM | POA: Diagnosis not present

## 2018-07-03 DIAGNOSIS — I679 Cerebrovascular disease, unspecified: Secondary | ICD-10-CM | POA: Diagnosis not present

## 2018-07-03 DIAGNOSIS — G9009 Other idiopathic peripheral autonomic neuropathy: Secondary | ICD-10-CM | POA: Diagnosis not present

## 2018-07-03 DIAGNOSIS — I739 Peripheral vascular disease, unspecified: Secondary | ICD-10-CM | POA: Diagnosis not present

## 2018-07-03 DIAGNOSIS — I509 Heart failure, unspecified: Secondary | ICD-10-CM | POA: Diagnosis not present

## 2018-07-04 DIAGNOSIS — I679 Cerebrovascular disease, unspecified: Secondary | ICD-10-CM | POA: Diagnosis not present

## 2018-07-04 DIAGNOSIS — G9009 Other idiopathic peripheral autonomic neuropathy: Secondary | ICD-10-CM | POA: Diagnosis not present

## 2018-07-04 DIAGNOSIS — I4891 Unspecified atrial fibrillation: Secondary | ICD-10-CM | POA: Diagnosis not present

## 2018-07-04 DIAGNOSIS — E46 Unspecified protein-calorie malnutrition: Secondary | ICD-10-CM | POA: Diagnosis not present

## 2018-07-04 DIAGNOSIS — I509 Heart failure, unspecified: Secondary | ICD-10-CM | POA: Diagnosis not present

## 2018-07-04 DIAGNOSIS — I739 Peripheral vascular disease, unspecified: Secondary | ICD-10-CM | POA: Diagnosis not present

## 2018-07-05 DIAGNOSIS — I739 Peripheral vascular disease, unspecified: Secondary | ICD-10-CM | POA: Diagnosis not present

## 2018-07-05 DIAGNOSIS — I509 Heart failure, unspecified: Secondary | ICD-10-CM | POA: Diagnosis not present

## 2018-07-05 DIAGNOSIS — I679 Cerebrovascular disease, unspecified: Secondary | ICD-10-CM | POA: Diagnosis not present

## 2018-07-05 DIAGNOSIS — I4891 Unspecified atrial fibrillation: Secondary | ICD-10-CM | POA: Diagnosis not present

## 2018-07-05 DIAGNOSIS — E46 Unspecified protein-calorie malnutrition: Secondary | ICD-10-CM | POA: Diagnosis not present

## 2018-07-05 DIAGNOSIS — G9009 Other idiopathic peripheral autonomic neuropathy: Secondary | ICD-10-CM | POA: Diagnosis not present

## 2018-07-07 DIAGNOSIS — I4891 Unspecified atrial fibrillation: Secondary | ICD-10-CM | POA: Diagnosis not present

## 2018-07-07 DIAGNOSIS — I509 Heart failure, unspecified: Secondary | ICD-10-CM | POA: Diagnosis not present

## 2018-07-07 DIAGNOSIS — E46 Unspecified protein-calorie malnutrition: Secondary | ICD-10-CM | POA: Diagnosis not present

## 2018-07-07 DIAGNOSIS — I739 Peripheral vascular disease, unspecified: Secondary | ICD-10-CM | POA: Diagnosis not present

## 2018-07-07 DIAGNOSIS — G9009 Other idiopathic peripheral autonomic neuropathy: Secondary | ICD-10-CM | POA: Diagnosis not present

## 2018-07-07 DIAGNOSIS — I679 Cerebrovascular disease, unspecified: Secondary | ICD-10-CM | POA: Diagnosis not present

## 2018-07-10 DIAGNOSIS — I739 Peripheral vascular disease, unspecified: Secondary | ICD-10-CM | POA: Diagnosis not present

## 2018-07-10 DIAGNOSIS — E46 Unspecified protein-calorie malnutrition: Secondary | ICD-10-CM | POA: Diagnosis not present

## 2018-07-10 DIAGNOSIS — G9009 Other idiopathic peripheral autonomic neuropathy: Secondary | ICD-10-CM | POA: Diagnosis not present

## 2018-07-10 DIAGNOSIS — I509 Heart failure, unspecified: Secondary | ICD-10-CM | POA: Diagnosis not present

## 2018-07-10 DIAGNOSIS — I4891 Unspecified atrial fibrillation: Secondary | ICD-10-CM | POA: Diagnosis not present

## 2018-07-10 DIAGNOSIS — I679 Cerebrovascular disease, unspecified: Secondary | ICD-10-CM | POA: Diagnosis not present

## 2018-07-11 DIAGNOSIS — I509 Heart failure, unspecified: Secondary | ICD-10-CM | POA: Diagnosis not present

## 2018-07-11 DIAGNOSIS — I4891 Unspecified atrial fibrillation: Secondary | ICD-10-CM | POA: Diagnosis not present

## 2018-07-11 DIAGNOSIS — E46 Unspecified protein-calorie malnutrition: Secondary | ICD-10-CM | POA: Diagnosis not present

## 2018-07-11 DIAGNOSIS — I679 Cerebrovascular disease, unspecified: Secondary | ICD-10-CM | POA: Diagnosis not present

## 2018-07-11 DIAGNOSIS — G9009 Other idiopathic peripheral autonomic neuropathy: Secondary | ICD-10-CM | POA: Diagnosis not present

## 2018-07-11 DIAGNOSIS — I739 Peripheral vascular disease, unspecified: Secondary | ICD-10-CM | POA: Diagnosis not present

## 2018-07-12 ENCOUNTER — Telehealth: Payer: Self-pay | Admitting: Family Medicine

## 2018-07-12 DIAGNOSIS — I679 Cerebrovascular disease, unspecified: Secondary | ICD-10-CM | POA: Diagnosis not present

## 2018-07-12 DIAGNOSIS — I4891 Unspecified atrial fibrillation: Secondary | ICD-10-CM | POA: Diagnosis not present

## 2018-07-12 DIAGNOSIS — I509 Heart failure, unspecified: Secondary | ICD-10-CM | POA: Diagnosis not present

## 2018-07-12 DIAGNOSIS — E46 Unspecified protein-calorie malnutrition: Secondary | ICD-10-CM | POA: Diagnosis not present

## 2018-07-12 DIAGNOSIS — I739 Peripheral vascular disease, unspecified: Secondary | ICD-10-CM | POA: Diagnosis not present

## 2018-07-12 DIAGNOSIS — G9009 Other idiopathic peripheral autonomic neuropathy: Secondary | ICD-10-CM | POA: Diagnosis not present

## 2018-07-12 NOTE — Telephone Encounter (Signed)
Shay with [facility] contacted office to see if paper had been filled out for patient. Isaias Sakai states papers were faxed back in July and this is the second attempt. Papers were re faxed. Papers are in providers office to be filled out. Please advise.

## 2018-07-13 ENCOUNTER — Telehealth: Payer: Self-pay | Admitting: Family Medicine

## 2018-07-13 DIAGNOSIS — I4891 Unspecified atrial fibrillation: Secondary | ICD-10-CM | POA: Diagnosis not present

## 2018-07-13 DIAGNOSIS — G9009 Other idiopathic peripheral autonomic neuropathy: Secondary | ICD-10-CM | POA: Diagnosis not present

## 2018-07-13 DIAGNOSIS — E46 Unspecified protein-calorie malnutrition: Secondary | ICD-10-CM | POA: Diagnosis not present

## 2018-07-13 DIAGNOSIS — I679 Cerebrovascular disease, unspecified: Secondary | ICD-10-CM | POA: Diagnosis not present

## 2018-07-13 DIAGNOSIS — I739 Peripheral vascular disease, unspecified: Secondary | ICD-10-CM | POA: Diagnosis not present

## 2018-07-13 DIAGNOSIS — I509 Heart failure, unspecified: Secondary | ICD-10-CM | POA: Diagnosis not present

## 2018-07-13 NOTE — Telephone Encounter (Signed)
Spoke with [name] and she states around 5:30 or 6 would be fine.

## 2018-07-13 NOTE — Telephone Encounter (Signed)
I can see tonight aroudn 5 30 or 6

## 2018-07-13 NOTE — Telephone Encounter (Signed)
Spoke with Christian Thomas. Papers are being faxed at this time.

## 2018-07-13 NOTE — Telephone Encounter (Signed)
done

## 2018-07-13 NOTE — Telephone Encounter (Signed)
Ms. Leonides Schanz called to see when Dr. Richardson Landry would be coming to visit [name]. He has been up and down the last week. The last 4 days he has slept all day and all night and not had much to eat. Today he is having a great day. He had breakfast and lunch in the dining room and has been more talkative today. CB# [phone]

## 2018-07-14 DIAGNOSIS — I4891 Unspecified atrial fibrillation: Secondary | ICD-10-CM | POA: Diagnosis not present

## 2018-07-14 DIAGNOSIS — G9009 Other idiopathic peripheral autonomic neuropathy: Secondary | ICD-10-CM | POA: Diagnosis not present

## 2018-07-14 DIAGNOSIS — I679 Cerebrovascular disease, unspecified: Secondary | ICD-10-CM | POA: Diagnosis not present

## 2018-07-14 DIAGNOSIS — I739 Peripheral vascular disease, unspecified: Secondary | ICD-10-CM | POA: Diagnosis not present

## 2018-07-14 DIAGNOSIS — E46 Unspecified protein-calorie malnutrition: Secondary | ICD-10-CM | POA: Diagnosis not present

## 2018-07-14 DIAGNOSIS — I509 Heart failure, unspecified: Secondary | ICD-10-CM | POA: Diagnosis not present

## 2018-07-16 DIAGNOSIS — E46 Unspecified protein-calorie malnutrition: Secondary | ICD-10-CM | POA: Diagnosis not present

## 2018-07-16 DIAGNOSIS — I509 Heart failure, unspecified: Secondary | ICD-10-CM | POA: Diagnosis not present

## 2018-07-16 DIAGNOSIS — I679 Cerebrovascular disease, unspecified: Secondary | ICD-10-CM | POA: Diagnosis not present

## 2018-07-16 DIAGNOSIS — J309 Allergic rhinitis, unspecified: Secondary | ICD-10-CM | POA: Diagnosis not present

## 2018-07-16 DIAGNOSIS — I4891 Unspecified atrial fibrillation: Secondary | ICD-10-CM | POA: Diagnosis not present

## 2018-07-16 DIAGNOSIS — E785 Hyperlipidemia, unspecified: Secondary | ICD-10-CM | POA: Diagnosis not present

## 2018-07-16 DIAGNOSIS — M109 Gout, unspecified: Secondary | ICD-10-CM | POA: Diagnosis not present

## 2018-07-16 DIAGNOSIS — N401 Enlarged prostate with lower urinary tract symptoms: Secondary | ICD-10-CM | POA: Diagnosis not present

## 2018-07-16 DIAGNOSIS — E1159 Type 2 diabetes mellitus with other circulatory complications: Secondary | ICD-10-CM | POA: Diagnosis not present

## 2018-07-16 DIAGNOSIS — R54 Age-related physical debility: Secondary | ICD-10-CM | POA: Diagnosis not present

## 2018-07-16 DIAGNOSIS — R634 Abnormal weight loss: Secondary | ICD-10-CM | POA: Diagnosis not present

## 2018-07-16 DIAGNOSIS — R1310 Dysphagia, unspecified: Secondary | ICD-10-CM | POA: Diagnosis not present

## 2018-07-16 DIAGNOSIS — I10 Essential (primary) hypertension: Secondary | ICD-10-CM | POA: Diagnosis not present

## 2018-07-16 DIAGNOSIS — G9009 Other idiopathic peripheral autonomic neuropathy: Secondary | ICD-10-CM | POA: Diagnosis not present

## 2018-07-16 DIAGNOSIS — I739 Peripheral vascular disease, unspecified: Secondary | ICD-10-CM | POA: Diagnosis not present

## 2018-07-17 DIAGNOSIS — I679 Cerebrovascular disease, unspecified: Secondary | ICD-10-CM | POA: Diagnosis not present

## 2018-07-17 DIAGNOSIS — E46 Unspecified protein-calorie malnutrition: Secondary | ICD-10-CM | POA: Diagnosis not present

## 2018-07-17 DIAGNOSIS — G9009 Other idiopathic peripheral autonomic neuropathy: Secondary | ICD-10-CM | POA: Diagnosis not present

## 2018-07-17 DIAGNOSIS — I739 Peripheral vascular disease, unspecified: Secondary | ICD-10-CM | POA: Diagnosis not present

## 2018-07-17 DIAGNOSIS — I4891 Unspecified atrial fibrillation: Secondary | ICD-10-CM | POA: Diagnosis not present

## 2018-07-17 DIAGNOSIS — I509 Heart failure, unspecified: Secondary | ICD-10-CM | POA: Diagnosis not present

## 2018-07-18 ENCOUNTER — Telehealth: Payer: Self-pay | Admitting: *Deleted

## 2018-07-18 DIAGNOSIS — G9009 Other idiopathic peripheral autonomic neuropathy: Secondary | ICD-10-CM | POA: Diagnosis not present

## 2018-07-18 DIAGNOSIS — I679 Cerebrovascular disease, unspecified: Secondary | ICD-10-CM | POA: Diagnosis not present

## 2018-07-18 DIAGNOSIS — I739 Peripheral vascular disease, unspecified: Secondary | ICD-10-CM | POA: Diagnosis not present

## 2018-07-18 DIAGNOSIS — I4891 Unspecified atrial fibrillation: Secondary | ICD-10-CM | POA: Diagnosis not present

## 2018-07-18 DIAGNOSIS — E46 Unspecified protein-calorie malnutrition: Secondary | ICD-10-CM | POA: Diagnosis not present

## 2018-07-18 DIAGNOSIS — I509 Heart failure, unspecified: Secondary | ICD-10-CM | POA: Diagnosis not present

## 2018-07-18 MED ORDER — ALPRAZOLAM 0.25 MG PO TABS: 0.2500 mg | ORAL_TABLET | Freq: Two times a day (BID) | ORAL | 5 refills | Status: DC | PRN

## 2018-07-18 NOTE — Telephone Encounter (Signed)
May ref xanax same dose six moworth

## 2018-07-18 NOTE — Telephone Encounter (Signed)
Sharyn Lull at [facility] called and stated that they need a d/c order for patient's robitussin. Patient states the Robitussin burns and he vomits after taking it.   They also need a new order for Xanax

## 2018-07-18 NOTE — Addendum Note (Signed)
Addended by: Dairl Ponder on: 07/18/2018 03:05 PM   Modules accepted: Orders

## 2018-07-18 NOTE — Telephone Encounter (Signed)
Prescription faxed to pharmacy-orders faxed to [facility].

## 2018-07-19 ENCOUNTER — Other Ambulatory Visit: Payer: Self-pay | Admitting: *Deleted

## 2018-07-19 DIAGNOSIS — I739 Peripheral vascular disease, unspecified: Secondary | ICD-10-CM | POA: Diagnosis not present

## 2018-07-19 DIAGNOSIS — I4891 Unspecified atrial fibrillation: Secondary | ICD-10-CM | POA: Diagnosis not present

## 2018-07-19 DIAGNOSIS — I679 Cerebrovascular disease, unspecified: Secondary | ICD-10-CM | POA: Diagnosis not present

## 2018-07-19 DIAGNOSIS — G9009 Other idiopathic peripheral autonomic neuropathy: Secondary | ICD-10-CM | POA: Diagnosis not present

## 2018-07-19 DIAGNOSIS — E46 Unspecified protein-calorie malnutrition: Secondary | ICD-10-CM | POA: Diagnosis not present

## 2018-07-19 DIAGNOSIS — I509 Heart failure, unspecified: Secondary | ICD-10-CM | POA: Diagnosis not present

## 2018-07-19 MED ORDER — ALPRAZOLAM 0.25 MG PO TABS: 0.2500 mg | ORAL_TABLET | Freq: Two times a day (BID) | ORAL | 5 refills | Status: DC

## 2018-07-21 ENCOUNTER — Telehealth: Payer: Self-pay | Admitting: Family Medicine

## 2018-07-21 DIAGNOSIS — I679 Cerebrovascular disease, unspecified: Secondary | ICD-10-CM | POA: Diagnosis not present

## 2018-07-21 DIAGNOSIS — I739 Peripheral vascular disease, unspecified: Secondary | ICD-10-CM | POA: Diagnosis not present

## 2018-07-21 DIAGNOSIS — G9009 Other idiopathic peripheral autonomic neuropathy: Secondary | ICD-10-CM | POA: Diagnosis not present

## 2018-07-21 DIAGNOSIS — I4891 Unspecified atrial fibrillation: Secondary | ICD-10-CM | POA: Diagnosis not present

## 2018-07-21 DIAGNOSIS — E46 Unspecified protein-calorie malnutrition: Secondary | ICD-10-CM | POA: Diagnosis not present

## 2018-07-21 DIAGNOSIS — I509 Heart failure, unspecified: Secondary | ICD-10-CM | POA: Diagnosis not present

## 2018-07-21 NOTE — Telephone Encounter (Signed)
This was signed and sent in thank you

## 2018-07-21 NOTE — Telephone Encounter (Signed)
Hospice nurse calling today to see if we received fax from yesterday regarding verbal order Dr.Scott spoke with her about yesterday about increasing patient xanax. Hospice nurse was calling to see if this had been signed. Please advise. Thanks

## 2018-07-21 NOTE — Telephone Encounter (Signed)
Paper has been faxed. 

## 2018-07-24 ENCOUNTER — Other Ambulatory Visit: Payer: Self-pay | Admitting: Family Medicine

## 2018-07-24 DIAGNOSIS — I4891 Unspecified atrial fibrillation: Secondary | ICD-10-CM | POA: Diagnosis not present

## 2018-07-24 DIAGNOSIS — I679 Cerebrovascular disease, unspecified: Secondary | ICD-10-CM | POA: Diagnosis not present

## 2018-07-24 DIAGNOSIS — G9009 Other idiopathic peripheral autonomic neuropathy: Secondary | ICD-10-CM | POA: Diagnosis not present

## 2018-07-24 DIAGNOSIS — I509 Heart failure, unspecified: Secondary | ICD-10-CM | POA: Diagnosis not present

## 2018-07-24 DIAGNOSIS — E46 Unspecified protein-calorie malnutrition: Secondary | ICD-10-CM | POA: Diagnosis not present

## 2018-07-24 DIAGNOSIS — I739 Peripheral vascular disease, unspecified: Secondary | ICD-10-CM | POA: Diagnosis not present

## 2018-07-25 DIAGNOSIS — I509 Heart failure, unspecified: Secondary | ICD-10-CM | POA: Diagnosis not present

## 2018-07-25 DIAGNOSIS — E46 Unspecified protein-calorie malnutrition: Secondary | ICD-10-CM | POA: Diagnosis not present

## 2018-07-25 DIAGNOSIS — G9009 Other idiopathic peripheral autonomic neuropathy: Secondary | ICD-10-CM | POA: Diagnosis not present

## 2018-07-25 DIAGNOSIS — I739 Peripheral vascular disease, unspecified: Secondary | ICD-10-CM | POA: Diagnosis not present

## 2018-07-25 DIAGNOSIS — I4891 Unspecified atrial fibrillation: Secondary | ICD-10-CM | POA: Diagnosis not present

## 2018-07-25 DIAGNOSIS — I679 Cerebrovascular disease, unspecified: Secondary | ICD-10-CM | POA: Diagnosis not present

## 2018-07-26 DIAGNOSIS — I739 Peripheral vascular disease, unspecified: Secondary | ICD-10-CM | POA: Diagnosis not present

## 2018-07-26 DIAGNOSIS — I509 Heart failure, unspecified: Secondary | ICD-10-CM | POA: Diagnosis not present

## 2018-07-26 DIAGNOSIS — E46 Unspecified protein-calorie malnutrition: Secondary | ICD-10-CM | POA: Diagnosis not present

## 2018-07-26 DIAGNOSIS — I4891 Unspecified atrial fibrillation: Secondary | ICD-10-CM | POA: Diagnosis not present

## 2018-07-26 DIAGNOSIS — I679 Cerebrovascular disease, unspecified: Secondary | ICD-10-CM | POA: Diagnosis not present

## 2018-07-26 DIAGNOSIS — G9009 Other idiopathic peripheral autonomic neuropathy: Secondary | ICD-10-CM | POA: Diagnosis not present

## 2018-07-27 DIAGNOSIS — I739 Peripheral vascular disease, unspecified: Secondary | ICD-10-CM | POA: Diagnosis not present

## 2018-07-27 DIAGNOSIS — I4891 Unspecified atrial fibrillation: Secondary | ICD-10-CM | POA: Diagnosis not present

## 2018-07-27 DIAGNOSIS — I679 Cerebrovascular disease, unspecified: Secondary | ICD-10-CM | POA: Diagnosis not present

## 2018-07-27 DIAGNOSIS — G9009 Other idiopathic peripheral autonomic neuropathy: Secondary | ICD-10-CM | POA: Diagnosis not present

## 2018-07-27 DIAGNOSIS — E46 Unspecified protein-calorie malnutrition: Secondary | ICD-10-CM | POA: Diagnosis not present

## 2018-07-27 DIAGNOSIS — I509 Heart failure, unspecified: Secondary | ICD-10-CM | POA: Diagnosis not present

## 2018-07-31 DIAGNOSIS — I509 Heart failure, unspecified: Secondary | ICD-10-CM | POA: Diagnosis not present

## 2018-07-31 DIAGNOSIS — G9009 Other idiopathic peripheral autonomic neuropathy: Secondary | ICD-10-CM | POA: Diagnosis not present

## 2018-07-31 DIAGNOSIS — I679 Cerebrovascular disease, unspecified: Secondary | ICD-10-CM | POA: Diagnosis not present

## 2018-07-31 DIAGNOSIS — I739 Peripheral vascular disease, unspecified: Secondary | ICD-10-CM | POA: Diagnosis not present

## 2018-07-31 DIAGNOSIS — E46 Unspecified protein-calorie malnutrition: Secondary | ICD-10-CM | POA: Diagnosis not present

## 2018-07-31 DIAGNOSIS — I4891 Unspecified atrial fibrillation: Secondary | ICD-10-CM | POA: Diagnosis not present

## 2018-08-01 DIAGNOSIS — I4891 Unspecified atrial fibrillation: Secondary | ICD-10-CM | POA: Diagnosis not present

## 2018-08-01 DIAGNOSIS — I739 Peripheral vascular disease, unspecified: Secondary | ICD-10-CM | POA: Diagnosis not present

## 2018-08-01 DIAGNOSIS — G9009 Other idiopathic peripheral autonomic neuropathy: Secondary | ICD-10-CM | POA: Diagnosis not present

## 2018-08-01 DIAGNOSIS — I509 Heart failure, unspecified: Secondary | ICD-10-CM | POA: Diagnosis not present

## 2018-08-01 DIAGNOSIS — I679 Cerebrovascular disease, unspecified: Secondary | ICD-10-CM | POA: Diagnosis not present

## 2018-08-01 DIAGNOSIS — E46 Unspecified protein-calorie malnutrition: Secondary | ICD-10-CM | POA: Diagnosis not present

## 2018-08-02 DIAGNOSIS — G9009 Other idiopathic peripheral autonomic neuropathy: Secondary | ICD-10-CM | POA: Diagnosis not present

## 2018-08-02 DIAGNOSIS — E46 Unspecified protein-calorie malnutrition: Secondary | ICD-10-CM | POA: Diagnosis not present

## 2018-08-02 DIAGNOSIS — I4891 Unspecified atrial fibrillation: Secondary | ICD-10-CM | POA: Diagnosis not present

## 2018-08-02 DIAGNOSIS — I679 Cerebrovascular disease, unspecified: Secondary | ICD-10-CM | POA: Diagnosis not present

## 2018-08-02 DIAGNOSIS — I739 Peripheral vascular disease, unspecified: Secondary | ICD-10-CM | POA: Diagnosis not present

## 2018-08-02 DIAGNOSIS — I509 Heart failure, unspecified: Secondary | ICD-10-CM | POA: Diagnosis not present

## 2018-08-03 ENCOUNTER — Telehealth: Payer: Self-pay | Admitting: Family Medicine

## 2018-08-03 NOTE — Telephone Encounter (Signed)
[name] calling to see when Dr. Richardson Landry is going to come visit Christian Thomas.

## 2018-08-04 DIAGNOSIS — I4891 Unspecified atrial fibrillation: Secondary | ICD-10-CM | POA: Diagnosis not present

## 2018-08-04 DIAGNOSIS — I509 Heart failure, unspecified: Secondary | ICD-10-CM | POA: Diagnosis not present

## 2018-08-04 DIAGNOSIS — E46 Unspecified protein-calorie malnutrition: Secondary | ICD-10-CM | POA: Diagnosis not present

## 2018-08-04 DIAGNOSIS — G9009 Other idiopathic peripheral autonomic neuropathy: Secondary | ICD-10-CM | POA: Diagnosis not present

## 2018-08-04 DIAGNOSIS — I679 Cerebrovascular disease, unspecified: Secondary | ICD-10-CM | POA: Diagnosis not present

## 2018-08-04 DIAGNOSIS — I739 Peripheral vascular disease, unspecified: Secondary | ICD-10-CM | POA: Diagnosis not present

## 2018-08-04 NOTE — Telephone Encounter (Signed)
[name](DPR)  notified that Dr Richardson Landry will see Marcello Moores Monday evening at 6 30pm. [name] verbalized understanding.

## 2018-08-04 NOTE — Telephone Encounter (Signed)
Let joy know ive been totally working day and eve all week, and have cone meeting tonight til 8 30 pm after office hrs and tomorrow all day with board meetings plus paper work, will see mon eve at [phone]

## 2018-08-07 ENCOUNTER — Ambulatory Visit: Payer: Medicare Other | Admitting: Family Medicine

## 2018-08-07 DIAGNOSIS — M170 Bilateral primary osteoarthritis of knee: Secondary | ICD-10-CM

## 2018-08-07 DIAGNOSIS — E118 Type 2 diabetes mellitus with unspecified complications: Secondary | ICD-10-CM | POA: Diagnosis not present

## 2018-08-07 DIAGNOSIS — K5901 Slow transit constipation: Secondary | ICD-10-CM

## 2018-08-07 DIAGNOSIS — R2681 Unsteadiness on feet: Secondary | ICD-10-CM | POA: Diagnosis not present

## 2018-08-07 DIAGNOSIS — R531 Weakness: Secondary | ICD-10-CM

## 2018-08-07 DIAGNOSIS — F321 Major depressive disorder, single episode, moderate: Secondary | ICD-10-CM

## 2018-08-08 ENCOUNTER — Telehealth: Payer: Self-pay | Admitting: Family Medicine

## 2018-08-08 ENCOUNTER — Other Ambulatory Visit: Payer: Self-pay

## 2018-08-08 ENCOUNTER — Ambulatory Visit: Payer: PRIVATE HEALTH INSURANCE | Admitting: Family Medicine

## 2018-08-08 DIAGNOSIS — G9009 Other idiopathic peripheral autonomic neuropathy: Secondary | ICD-10-CM | POA: Diagnosis not present

## 2018-08-08 DIAGNOSIS — I4891 Unspecified atrial fibrillation: Secondary | ICD-10-CM | POA: Diagnosis not present

## 2018-08-08 DIAGNOSIS — I739 Peripheral vascular disease, unspecified: Secondary | ICD-10-CM | POA: Diagnosis not present

## 2018-08-08 DIAGNOSIS — I679 Cerebrovascular disease, unspecified: Secondary | ICD-10-CM | POA: Diagnosis not present

## 2018-08-08 DIAGNOSIS — I509 Heart failure, unspecified: Secondary | ICD-10-CM | POA: Diagnosis not present

## 2018-08-08 DIAGNOSIS — E46 Unspecified protein-calorie malnutrition: Secondary | ICD-10-CM | POA: Diagnosis not present

## 2018-08-08 MED ORDER — ESCITALOPRAM OXALATE 10 MG PO TABS: ORAL_TABLET | ORAL | 5 refills | Status: AC

## 2018-08-08 MED ORDER — ALBUTEROL SULFATE (2.5 MG/3ML) 0.083% IN NEBU: INHALATION_SOLUTION | RESPIRATORY_TRACT | 5 refills | Status: AC

## 2018-08-08 NOTE — Telephone Encounter (Signed)
Orders written out on prescription pad, need to be signed to be faxed over to [facility]. D/C norvasc and ordered lexapro 10 mg every morning sent to [facility]. Changed neb treatment to twice daily in Epic. Left message on Joys voicemail to inform her of changes.

## 2018-08-08 NOTE — Telephone Encounter (Signed)
Orders for:  Increase miralax to twice per day placed in his drink for bfast and supper  Change neb treatments to bid  Stop norvasc  Start lexapro 10 mg q am

## 2018-08-08 NOTE — Telephone Encounter (Signed)
Phone message open per provider request. 

## 2018-08-08 NOTE — Telephone Encounter (Signed)
Scripts signed and faxed to [facility].

## 2018-08-09 DIAGNOSIS — I4891 Unspecified atrial fibrillation: Secondary | ICD-10-CM | POA: Diagnosis not present

## 2018-08-09 DIAGNOSIS — I679 Cerebrovascular disease, unspecified: Secondary | ICD-10-CM | POA: Diagnosis not present

## 2018-08-09 DIAGNOSIS — I509 Heart failure, unspecified: Secondary | ICD-10-CM | POA: Diagnosis not present

## 2018-08-09 DIAGNOSIS — G9009 Other idiopathic peripheral autonomic neuropathy: Secondary | ICD-10-CM | POA: Diagnosis not present

## 2018-08-09 DIAGNOSIS — E46 Unspecified protein-calorie malnutrition: Secondary | ICD-10-CM | POA: Diagnosis not present

## 2018-08-09 DIAGNOSIS — I739 Peripheral vascular disease, unspecified: Secondary | ICD-10-CM | POA: Diagnosis not present

## 2018-08-10 DIAGNOSIS — Z23 Encounter for immunization: Secondary | ICD-10-CM | POA: Diagnosis not present

## 2018-08-10 DIAGNOSIS — E46 Unspecified protein-calorie malnutrition: Secondary | ICD-10-CM | POA: Diagnosis not present

## 2018-08-10 DIAGNOSIS — I509 Heart failure, unspecified: Secondary | ICD-10-CM | POA: Diagnosis not present

## 2018-08-10 DIAGNOSIS — I739 Peripheral vascular disease, unspecified: Secondary | ICD-10-CM | POA: Diagnosis not present

## 2018-08-10 DIAGNOSIS — I4891 Unspecified atrial fibrillation: Secondary | ICD-10-CM | POA: Diagnosis not present

## 2018-08-10 DIAGNOSIS — G9009 Other idiopathic peripheral autonomic neuropathy: Secondary | ICD-10-CM | POA: Diagnosis not present

## 2018-08-10 DIAGNOSIS — I679 Cerebrovascular disease, unspecified: Secondary | ICD-10-CM | POA: Diagnosis not present

## 2018-08-11 DIAGNOSIS — E46 Unspecified protein-calorie malnutrition: Secondary | ICD-10-CM | POA: Diagnosis not present

## 2018-08-11 DIAGNOSIS — I509 Heart failure, unspecified: Secondary | ICD-10-CM | POA: Diagnosis not present

## 2018-08-11 DIAGNOSIS — G9009 Other idiopathic peripheral autonomic neuropathy: Secondary | ICD-10-CM | POA: Diagnosis not present

## 2018-08-11 DIAGNOSIS — I739 Peripheral vascular disease, unspecified: Secondary | ICD-10-CM | POA: Diagnosis not present

## 2018-08-11 DIAGNOSIS — I4891 Unspecified atrial fibrillation: Secondary | ICD-10-CM | POA: Diagnosis not present

## 2018-08-11 DIAGNOSIS — I679 Cerebrovascular disease, unspecified: Secondary | ICD-10-CM | POA: Diagnosis not present

## 2018-08-13 ENCOUNTER — Encounter: Payer: Self-pay | Admitting: Family Medicine

## 2018-08-13 DIAGNOSIS — F321 Major depressive disorder, single episode, moderate: Secondary | ICD-10-CM | POA: Insufficient documentation

## 2018-08-13 NOTE — Progress Notes (Signed)
Patient is seen at the rest home for numerous concerns.  He still continues to experience neuropathic pain of the right foot.  This despite the fact he is on numerous medications for this.  Patient continues to have challenges with constipation.  Currently on stool softener.  This is discussed.  Patient notes less wheezing.  Breathing overall better.  At times is declining the breathing treatment  Patient currently compliant with blood pressure medication.  Blood pressures running good.  5 stable last blood pressure 132/78 alert talkative very hard of hearing.  No acute distress but clearly mild to moderate malaise.  Lungs clear no tachypnea heart regular rhythm and feet sensation minimal bilateral no deformities  Impression #1 depression.  This has emerged and appears to be substantial.  Discussed.  Discussed with family and caretaker.  Medication initiated  2.  Constipation.  Major challenge to patient.  Will work on adjusting stool softener along with stopping the Norvasc may be contributing.  3.  Respiratory symptoms improved post pneumonia earlier this year.  Will back off on breathing treatments  4.  Hypertension good control.  Unfortunately Norvasc likely adding side effect and will stop Norvasc and watch blood pressure.  Recommend changing this 3 times per week parameters discussed  5.  Type 2 diabetes good control  60 minutes spent with patient/family/adjusting medications/discussing with staff nurses and family plans  We will follow-up in several weeks

## 2018-08-14 DIAGNOSIS — I4891 Unspecified atrial fibrillation: Secondary | ICD-10-CM | POA: Diagnosis not present

## 2018-08-14 DIAGNOSIS — I679 Cerebrovascular disease, unspecified: Secondary | ICD-10-CM | POA: Diagnosis not present

## 2018-08-14 DIAGNOSIS — G9009 Other idiopathic peripheral autonomic neuropathy: Secondary | ICD-10-CM | POA: Diagnosis not present

## 2018-08-14 DIAGNOSIS — I509 Heart failure, unspecified: Secondary | ICD-10-CM | POA: Diagnosis not present

## 2018-08-14 DIAGNOSIS — I739 Peripheral vascular disease, unspecified: Secondary | ICD-10-CM | POA: Diagnosis not present

## 2018-08-14 DIAGNOSIS — E46 Unspecified protein-calorie malnutrition: Secondary | ICD-10-CM | POA: Diagnosis not present

## 2018-08-15 DIAGNOSIS — N401 Enlarged prostate with lower urinary tract symptoms: Secondary | ICD-10-CM | POA: Diagnosis not present

## 2018-08-15 DIAGNOSIS — I4891 Unspecified atrial fibrillation: Secondary | ICD-10-CM | POA: Diagnosis not present

## 2018-08-15 DIAGNOSIS — I679 Cerebrovascular disease, unspecified: Secondary | ICD-10-CM | POA: Diagnosis not present

## 2018-08-15 DIAGNOSIS — I739 Peripheral vascular disease, unspecified: Secondary | ICD-10-CM | POA: Diagnosis not present

## 2018-08-15 DIAGNOSIS — R1310 Dysphagia, unspecified: Secondary | ICD-10-CM | POA: Diagnosis not present

## 2018-08-15 DIAGNOSIS — E1159 Type 2 diabetes mellitus with other circulatory complications: Secondary | ICD-10-CM | POA: Diagnosis not present

## 2018-08-15 DIAGNOSIS — R54 Age-related physical debility: Secondary | ICD-10-CM | POA: Diagnosis not present

## 2018-08-15 DIAGNOSIS — G9009 Other idiopathic peripheral autonomic neuropathy: Secondary | ICD-10-CM | POA: Diagnosis not present

## 2018-08-15 DIAGNOSIS — E46 Unspecified protein-calorie malnutrition: Secondary | ICD-10-CM | POA: Diagnosis not present

## 2018-08-15 DIAGNOSIS — I509 Heart failure, unspecified: Secondary | ICD-10-CM | POA: Diagnosis not present

## 2018-08-15 DIAGNOSIS — I10 Essential (primary) hypertension: Secondary | ICD-10-CM | POA: Diagnosis not present

## 2018-08-15 DIAGNOSIS — R634 Abnormal weight loss: Secondary | ICD-10-CM | POA: Diagnosis not present

## 2018-08-15 DIAGNOSIS — J309 Allergic rhinitis, unspecified: Secondary | ICD-10-CM | POA: Diagnosis not present

## 2018-08-15 DIAGNOSIS — M109 Gout, unspecified: Secondary | ICD-10-CM | POA: Diagnosis not present

## 2018-08-15 DIAGNOSIS — E785 Hyperlipidemia, unspecified: Secondary | ICD-10-CM | POA: Diagnosis not present

## 2018-08-16 DIAGNOSIS — G9009 Other idiopathic peripheral autonomic neuropathy: Secondary | ICD-10-CM | POA: Diagnosis not present

## 2018-08-16 DIAGNOSIS — I679 Cerebrovascular disease, unspecified: Secondary | ICD-10-CM | POA: Diagnosis not present

## 2018-08-16 DIAGNOSIS — I509 Heart failure, unspecified: Secondary | ICD-10-CM | POA: Diagnosis not present

## 2018-08-16 DIAGNOSIS — E46 Unspecified protein-calorie malnutrition: Secondary | ICD-10-CM | POA: Diagnosis not present

## 2018-08-16 DIAGNOSIS — I739 Peripheral vascular disease, unspecified: Secondary | ICD-10-CM | POA: Diagnosis not present

## 2018-08-16 DIAGNOSIS — I4891 Unspecified atrial fibrillation: Secondary | ICD-10-CM | POA: Diagnosis not present

## 2018-08-17 ENCOUNTER — Telehealth: Payer: Self-pay | Admitting: *Deleted

## 2018-08-17 DIAGNOSIS — I739 Peripheral vascular disease, unspecified: Secondary | ICD-10-CM | POA: Diagnosis not present

## 2018-08-17 DIAGNOSIS — G9009 Other idiopathic peripheral autonomic neuropathy: Secondary | ICD-10-CM | POA: Diagnosis not present

## 2018-08-17 DIAGNOSIS — E46 Unspecified protein-calorie malnutrition: Secondary | ICD-10-CM | POA: Diagnosis not present

## 2018-08-17 DIAGNOSIS — I4891 Unspecified atrial fibrillation: Secondary | ICD-10-CM | POA: Diagnosis not present

## 2018-08-17 DIAGNOSIS — I679 Cerebrovascular disease, unspecified: Secondary | ICD-10-CM | POA: Diagnosis not present

## 2018-08-17 DIAGNOSIS — I509 Heart failure, unspecified: Secondary | ICD-10-CM | POA: Diagnosis not present

## 2018-08-17 MED ORDER — TRAMADOL HCL 50 MG PO TABS: 50.0000 mg | ORAL_TABLET | Freq: Four times a day (QID) | ORAL | 0 refills | Status: DC | PRN

## 2018-08-17 NOTE — Telephone Encounter (Signed)
[facility] needs a hard script to pick up for tramadol 50mg  one every 6 hours prn moderate pain. Last seen 08/07/18 Phone number [phone]

## 2018-08-17 NOTE — Telephone Encounter (Signed)
wis 

## 2018-08-17 NOTE — Telephone Encounter (Signed)
Christian Thomas and the receptionist at [name] is aware that the rx is at the front desk to pick up for the pt.

## 2018-08-17 NOTE — Telephone Encounter (Signed)
Printed awaiting signature. 

## 2018-08-18 DIAGNOSIS — I509 Heart failure, unspecified: Secondary | ICD-10-CM | POA: Diagnosis not present

## 2018-08-18 DIAGNOSIS — I739 Peripheral vascular disease, unspecified: Secondary | ICD-10-CM | POA: Diagnosis not present

## 2018-08-18 DIAGNOSIS — I679 Cerebrovascular disease, unspecified: Secondary | ICD-10-CM | POA: Diagnosis not present

## 2018-08-18 DIAGNOSIS — E46 Unspecified protein-calorie malnutrition: Secondary | ICD-10-CM | POA: Diagnosis not present

## 2018-08-18 DIAGNOSIS — I4891 Unspecified atrial fibrillation: Secondary | ICD-10-CM | POA: Diagnosis not present

## 2018-08-18 DIAGNOSIS — G9009 Other idiopathic peripheral autonomic neuropathy: Secondary | ICD-10-CM | POA: Diagnosis not present

## 2018-08-21 DIAGNOSIS — E46 Unspecified protein-calorie malnutrition: Secondary | ICD-10-CM | POA: Diagnosis not present

## 2018-08-21 DIAGNOSIS — I4891 Unspecified atrial fibrillation: Secondary | ICD-10-CM | POA: Diagnosis not present

## 2018-08-21 DIAGNOSIS — G9009 Other idiopathic peripheral autonomic neuropathy: Secondary | ICD-10-CM | POA: Diagnosis not present

## 2018-08-21 DIAGNOSIS — I679 Cerebrovascular disease, unspecified: Secondary | ICD-10-CM | POA: Diagnosis not present

## 2018-08-21 DIAGNOSIS — I739 Peripheral vascular disease, unspecified: Secondary | ICD-10-CM | POA: Diagnosis not present

## 2018-08-21 DIAGNOSIS — I509 Heart failure, unspecified: Secondary | ICD-10-CM | POA: Diagnosis not present

## 2018-08-22 DIAGNOSIS — I739 Peripheral vascular disease, unspecified: Secondary | ICD-10-CM | POA: Diagnosis not present

## 2018-08-22 DIAGNOSIS — I4891 Unspecified atrial fibrillation: Secondary | ICD-10-CM | POA: Diagnosis not present

## 2018-08-22 DIAGNOSIS — G9009 Other idiopathic peripheral autonomic neuropathy: Secondary | ICD-10-CM | POA: Diagnosis not present

## 2018-08-22 DIAGNOSIS — E46 Unspecified protein-calorie malnutrition: Secondary | ICD-10-CM | POA: Diagnosis not present

## 2018-08-22 DIAGNOSIS — I509 Heart failure, unspecified: Secondary | ICD-10-CM | POA: Diagnosis not present

## 2018-08-22 DIAGNOSIS — I679 Cerebrovascular disease, unspecified: Secondary | ICD-10-CM | POA: Diagnosis not present

## 2018-08-23 ENCOUNTER — Telehealth: Payer: Self-pay | Admitting: Family Medicine

## 2018-08-23 DIAGNOSIS — E46 Unspecified protein-calorie malnutrition: Secondary | ICD-10-CM | POA: Diagnosis not present

## 2018-08-23 DIAGNOSIS — I739 Peripheral vascular disease, unspecified: Secondary | ICD-10-CM | POA: Diagnosis not present

## 2018-08-23 DIAGNOSIS — I4891 Unspecified atrial fibrillation: Secondary | ICD-10-CM | POA: Diagnosis not present

## 2018-08-23 DIAGNOSIS — G9009 Other idiopathic peripheral autonomic neuropathy: Secondary | ICD-10-CM | POA: Diagnosis not present

## 2018-08-23 DIAGNOSIS — I679 Cerebrovascular disease, unspecified: Secondary | ICD-10-CM | POA: Diagnosis not present

## 2018-08-23 DIAGNOSIS — I509 Heart failure, unspecified: Secondary | ICD-10-CM | POA: Diagnosis not present

## 2018-08-23 NOTE — Telephone Encounter (Signed)
[facility] dropped off papers to be signed on patient.In your box in office.

## 2018-08-24 ENCOUNTER — Other Ambulatory Visit: Payer: Self-pay | Admitting: *Deleted

## 2018-08-24 ENCOUNTER — Telehealth: Payer: Self-pay | Admitting: Family Medicine

## 2018-08-24 MED ORDER — TRAMADOL HCL 50 MG PO TABS: 50.0000 mg | ORAL_TABLET | Freq: Four times a day (QID) | ORAL | 5 refills | Status: DC | PRN

## 2018-08-24 NOTE — Telephone Encounter (Signed)
Pt added to schedule for Monday at 4:50 and [name] notified you would come around 6 or so

## 2018-08-24 NOTE — Telephone Encounter (Signed)
[name] notified you would come by Monday at 6 or so

## 2018-08-24 NOTE — Telephone Encounter (Signed)
wis thought we just did put refills on plz six

## 2018-08-24 NOTE — Telephone Encounter (Signed)
Patient needing a prescription for tramadol 50 mg

## 2018-08-24 NOTE — Telephone Encounter (Signed)
When dr Richardson Landry went to sign rx for tramadol 50mg  he states to increase about to 40 tablets since he takes fairly regularly and give 5 refills. rx redone and put up front for pickup. Dr Richardson Landry please see message below about pt's visit.

## 2018-08-24 NOTE — Telephone Encounter (Signed)
Script printed with refills. [name] notified. She also wanted to know when your next visit would be. Pt not having any issues just wanted to know if I knew when you would be coming out again. [name] wanted hard script to take to [facility]. Script up front for [name] to pick up.

## 2018-08-24 NOTE — Telephone Encounter (Signed)
i'll see Monday eve 6 pm or so

## 2018-08-25 DIAGNOSIS — I739 Peripheral vascular disease, unspecified: Secondary | ICD-10-CM | POA: Diagnosis not present

## 2018-08-25 DIAGNOSIS — G9009 Other idiopathic peripheral autonomic neuropathy: Secondary | ICD-10-CM | POA: Diagnosis not present

## 2018-08-25 DIAGNOSIS — I4891 Unspecified atrial fibrillation: Secondary | ICD-10-CM | POA: Diagnosis not present

## 2018-08-25 DIAGNOSIS — I679 Cerebrovascular disease, unspecified: Secondary | ICD-10-CM | POA: Diagnosis not present

## 2018-08-25 DIAGNOSIS — E46 Unspecified protein-calorie malnutrition: Secondary | ICD-10-CM | POA: Diagnosis not present

## 2018-08-25 DIAGNOSIS — I509 Heart failure, unspecified: Secondary | ICD-10-CM | POA: Diagnosis not present

## 2018-08-28 ENCOUNTER — Ambulatory Visit: Payer: Medicare Other | Admitting: Family Medicine

## 2018-08-28 DIAGNOSIS — R531 Weakness: Secondary | ICD-10-CM

## 2018-08-28 DIAGNOSIS — I10 Essential (primary) hypertension: Secondary | ICD-10-CM | POA: Diagnosis not present

## 2018-08-28 DIAGNOSIS — I739 Peripheral vascular disease, unspecified: Secondary | ICD-10-CM | POA: Diagnosis not present

## 2018-08-28 DIAGNOSIS — E118 Type 2 diabetes mellitus with unspecified complications: Secondary | ICD-10-CM

## 2018-08-28 DIAGNOSIS — N390 Urinary tract infection, site not specified: Secondary | ICD-10-CM | POA: Diagnosis not present

## 2018-08-28 DIAGNOSIS — I679 Cerebrovascular disease, unspecified: Secondary | ICD-10-CM | POA: Diagnosis not present

## 2018-08-28 DIAGNOSIS — I509 Heart failure, unspecified: Secondary | ICD-10-CM | POA: Diagnosis not present

## 2018-08-28 DIAGNOSIS — G9009 Other idiopathic peripheral autonomic neuropathy: Secondary | ICD-10-CM | POA: Diagnosis not present

## 2018-08-28 DIAGNOSIS — I4891 Unspecified atrial fibrillation: Secondary | ICD-10-CM | POA: Diagnosis not present

## 2018-08-28 DIAGNOSIS — K5901 Slow transit constipation: Secondary | ICD-10-CM | POA: Diagnosis not present

## 2018-08-28 DIAGNOSIS — E46 Unspecified protein-calorie malnutrition: Secondary | ICD-10-CM | POA: Diagnosis not present

## 2018-08-29 ENCOUNTER — Encounter: Payer: Self-pay | Admitting: Family Medicine

## 2018-08-29 DIAGNOSIS — E46 Unspecified protein-calorie malnutrition: Secondary | ICD-10-CM | POA: Diagnosis not present

## 2018-08-29 DIAGNOSIS — G9009 Other idiopathic peripheral autonomic neuropathy: Secondary | ICD-10-CM | POA: Diagnosis not present

## 2018-08-29 DIAGNOSIS — I739 Peripheral vascular disease, unspecified: Secondary | ICD-10-CM | POA: Diagnosis not present

## 2018-08-29 DIAGNOSIS — I4891 Unspecified atrial fibrillation: Secondary | ICD-10-CM | POA: Diagnosis not present

## 2018-08-29 DIAGNOSIS — I679 Cerebrovascular disease, unspecified: Secondary | ICD-10-CM | POA: Diagnosis not present

## 2018-08-29 DIAGNOSIS — I509 Heart failure, unspecified: Secondary | ICD-10-CM | POA: Diagnosis not present

## 2018-08-29 NOTE — Progress Notes (Signed)
Patient seen at rest home.  Reports overall breathing is better.  Handling the breathing treatments now just twice per day.  In addition continues to remain decent sugars despite being off diabetes medicine.  Discussed.  Patient also notes constipation is improved somewhat.  He continues to suffer from neuropathic pain.  We have him on about every possible medication for this.  Consult chart therefore vitals.  Blood pressure 132/78.  Afebrile.  Pulse 80.  Patient somewhat weak appearing, no acute distress, talkative HEENT normal lungs rare crackle and rhonchi rare cough no tachypnea no wheezes heart regular rate and ankles no edema feet sensation absent  Impression #1 chronic cough somewhat improved  2.  Chronic constipation somewhat improved  3.  Chronic neuropathic pain ongoing discussed medications to help some  4.  No CODE STATUS discussed with family  We will follow-up again in several weeks at that time we will inject the patient's needs flu shot already given  Greater than 50% of this 40 minute face to face visit was spent in counseling and discussion and coordination of care regarding the above diagnosis/diagnosies

## 2018-08-30 ENCOUNTER — Telehealth: Payer: Self-pay | Admitting: Family Medicine

## 2018-08-30 DIAGNOSIS — I739 Peripheral vascular disease, unspecified: Secondary | ICD-10-CM | POA: Diagnosis not present

## 2018-08-30 DIAGNOSIS — I4891 Unspecified atrial fibrillation: Secondary | ICD-10-CM | POA: Diagnosis not present

## 2018-08-30 DIAGNOSIS — I679 Cerebrovascular disease, unspecified: Secondary | ICD-10-CM | POA: Diagnosis not present

## 2018-08-30 DIAGNOSIS — G9009 Other idiopathic peripheral autonomic neuropathy: Secondary | ICD-10-CM | POA: Diagnosis not present

## 2018-08-30 DIAGNOSIS — I509 Heart failure, unspecified: Secondary | ICD-10-CM | POA: Diagnosis not present

## 2018-08-30 DIAGNOSIS — E46 Unspecified protein-calorie malnutrition: Secondary | ICD-10-CM | POA: Diagnosis not present

## 2018-08-30 NOTE — Telephone Encounter (Signed)
[name] is aware.

## 2018-08-30 NOTE — Telephone Encounter (Signed)
Ok we'll do next visit

## 2018-08-30 NOTE — Telephone Encounter (Signed)
[name] wanted you to know that patient  last injection in knees was July 2,2019

## 2018-08-30 NOTE — Telephone Encounter (Signed)
Please advise. Thank you

## 2018-09-01 DIAGNOSIS — I739 Peripheral vascular disease, unspecified: Secondary | ICD-10-CM | POA: Diagnosis not present

## 2018-09-01 DIAGNOSIS — I4891 Unspecified atrial fibrillation: Secondary | ICD-10-CM | POA: Diagnosis not present

## 2018-09-01 DIAGNOSIS — E46 Unspecified protein-calorie malnutrition: Secondary | ICD-10-CM | POA: Diagnosis not present

## 2018-09-01 DIAGNOSIS — G9009 Other idiopathic peripheral autonomic neuropathy: Secondary | ICD-10-CM | POA: Diagnosis not present

## 2018-09-01 DIAGNOSIS — I679 Cerebrovascular disease, unspecified: Secondary | ICD-10-CM | POA: Diagnosis not present

## 2018-09-01 DIAGNOSIS — I509 Heart failure, unspecified: Secondary | ICD-10-CM | POA: Diagnosis not present

## 2018-09-04 DIAGNOSIS — I679 Cerebrovascular disease, unspecified: Secondary | ICD-10-CM | POA: Diagnosis not present

## 2018-09-04 DIAGNOSIS — I4891 Unspecified atrial fibrillation: Secondary | ICD-10-CM | POA: Diagnosis not present

## 2018-09-04 DIAGNOSIS — I739 Peripheral vascular disease, unspecified: Secondary | ICD-10-CM | POA: Diagnosis not present

## 2018-09-04 DIAGNOSIS — G9009 Other idiopathic peripheral autonomic neuropathy: Secondary | ICD-10-CM | POA: Diagnosis not present

## 2018-09-04 DIAGNOSIS — I509 Heart failure, unspecified: Secondary | ICD-10-CM | POA: Diagnosis not present

## 2018-09-04 DIAGNOSIS — E46 Unspecified protein-calorie malnutrition: Secondary | ICD-10-CM | POA: Diagnosis not present

## 2018-09-05 DIAGNOSIS — I739 Peripheral vascular disease, unspecified: Secondary | ICD-10-CM | POA: Diagnosis not present

## 2018-09-05 DIAGNOSIS — I4891 Unspecified atrial fibrillation: Secondary | ICD-10-CM | POA: Diagnosis not present

## 2018-09-05 DIAGNOSIS — I509 Heart failure, unspecified: Secondary | ICD-10-CM | POA: Diagnosis not present

## 2018-09-05 DIAGNOSIS — G9009 Other idiopathic peripheral autonomic neuropathy: Secondary | ICD-10-CM | POA: Diagnosis not present

## 2018-09-05 DIAGNOSIS — I679 Cerebrovascular disease, unspecified: Secondary | ICD-10-CM | POA: Diagnosis not present

## 2018-09-05 DIAGNOSIS — E46 Unspecified protein-calorie malnutrition: Secondary | ICD-10-CM | POA: Diagnosis not present

## 2018-09-06 DIAGNOSIS — I739 Peripheral vascular disease, unspecified: Secondary | ICD-10-CM | POA: Diagnosis not present

## 2018-09-06 DIAGNOSIS — E46 Unspecified protein-calorie malnutrition: Secondary | ICD-10-CM | POA: Diagnosis not present

## 2018-09-06 DIAGNOSIS — G9009 Other idiopathic peripheral autonomic neuropathy: Secondary | ICD-10-CM | POA: Diagnosis not present

## 2018-09-06 DIAGNOSIS — I679 Cerebrovascular disease, unspecified: Secondary | ICD-10-CM | POA: Diagnosis not present

## 2018-09-06 DIAGNOSIS — I4891 Unspecified atrial fibrillation: Secondary | ICD-10-CM | POA: Diagnosis not present

## 2018-09-06 DIAGNOSIS — I509 Heart failure, unspecified: Secondary | ICD-10-CM | POA: Diagnosis not present

## 2018-09-08 DIAGNOSIS — E46 Unspecified protein-calorie malnutrition: Secondary | ICD-10-CM | POA: Diagnosis not present

## 2018-09-08 DIAGNOSIS — I739 Peripheral vascular disease, unspecified: Secondary | ICD-10-CM | POA: Diagnosis not present

## 2018-09-08 DIAGNOSIS — I4891 Unspecified atrial fibrillation: Secondary | ICD-10-CM | POA: Diagnosis not present

## 2018-09-08 DIAGNOSIS — G9009 Other idiopathic peripheral autonomic neuropathy: Secondary | ICD-10-CM | POA: Diagnosis not present

## 2018-09-08 DIAGNOSIS — I509 Heart failure, unspecified: Secondary | ICD-10-CM | POA: Diagnosis not present

## 2018-09-08 DIAGNOSIS — I679 Cerebrovascular disease, unspecified: Secondary | ICD-10-CM | POA: Diagnosis not present

## 2018-09-11 ENCOUNTER — Telehealth: Payer: Self-pay

## 2018-09-11 DIAGNOSIS — I679 Cerebrovascular disease, unspecified: Secondary | ICD-10-CM | POA: Diagnosis not present

## 2018-09-11 DIAGNOSIS — E46 Unspecified protein-calorie malnutrition: Secondary | ICD-10-CM | POA: Diagnosis not present

## 2018-09-11 DIAGNOSIS — I739 Peripheral vascular disease, unspecified: Secondary | ICD-10-CM | POA: Diagnosis not present

## 2018-09-11 DIAGNOSIS — G9009 Other idiopathic peripheral autonomic neuropathy: Secondary | ICD-10-CM | POA: Diagnosis not present

## 2018-09-11 DIAGNOSIS — I4891 Unspecified atrial fibrillation: Secondary | ICD-10-CM | POA: Diagnosis not present

## 2018-09-11 DIAGNOSIS — I509 Heart failure, unspecified: Secondary | ICD-10-CM | POA: Diagnosis not present

## 2018-09-11 NOTE — Telephone Encounter (Signed)
Received UA results on Mr.Riess that showed that pt had a Uti dated 08/28/2018. Per Dr.Steve Luking on the lab results wrote he would not recommend any rx at this time as pt had multiple species and would not treat unless pt is having a fever or obvious symptoms. I spoke with Sharyn Lull at [facility] and she is aware of no treatment at this time,but if does develop symptoms to call our office.

## 2018-09-12 DIAGNOSIS — I679 Cerebrovascular disease, unspecified: Secondary | ICD-10-CM | POA: Diagnosis not present

## 2018-09-12 DIAGNOSIS — E46 Unspecified protein-calorie malnutrition: Secondary | ICD-10-CM | POA: Diagnosis not present

## 2018-09-12 DIAGNOSIS — I4891 Unspecified atrial fibrillation: Secondary | ICD-10-CM | POA: Diagnosis not present

## 2018-09-12 DIAGNOSIS — G9009 Other idiopathic peripheral autonomic neuropathy: Secondary | ICD-10-CM | POA: Diagnosis not present

## 2018-09-12 DIAGNOSIS — I509 Heart failure, unspecified: Secondary | ICD-10-CM | POA: Diagnosis not present

## 2018-09-12 DIAGNOSIS — I739 Peripheral vascular disease, unspecified: Secondary | ICD-10-CM | POA: Diagnosis not present

## 2018-09-13 DIAGNOSIS — G9009 Other idiopathic peripheral autonomic neuropathy: Secondary | ICD-10-CM | POA: Diagnosis not present

## 2018-09-13 DIAGNOSIS — I4891 Unspecified atrial fibrillation: Secondary | ICD-10-CM | POA: Diagnosis not present

## 2018-09-13 DIAGNOSIS — I679 Cerebrovascular disease, unspecified: Secondary | ICD-10-CM | POA: Diagnosis not present

## 2018-09-13 DIAGNOSIS — E46 Unspecified protein-calorie malnutrition: Secondary | ICD-10-CM | POA: Diagnosis not present

## 2018-09-13 DIAGNOSIS — I509 Heart failure, unspecified: Secondary | ICD-10-CM | POA: Diagnosis not present

## 2018-09-13 DIAGNOSIS — I739 Peripheral vascular disease, unspecified: Secondary | ICD-10-CM | POA: Diagnosis not present

## 2018-09-15 DIAGNOSIS — E1159 Type 2 diabetes mellitus with other circulatory complications: Secondary | ICD-10-CM | POA: Diagnosis not present

## 2018-09-15 DIAGNOSIS — R1310 Dysphagia, unspecified: Secondary | ICD-10-CM | POA: Diagnosis not present

## 2018-09-15 DIAGNOSIS — E785 Hyperlipidemia, unspecified: Secondary | ICD-10-CM | POA: Diagnosis not present

## 2018-09-15 DIAGNOSIS — J309 Allergic rhinitis, unspecified: Secondary | ICD-10-CM | POA: Diagnosis not present

## 2018-09-15 DIAGNOSIS — R54 Age-related physical debility: Secondary | ICD-10-CM | POA: Diagnosis not present

## 2018-09-15 DIAGNOSIS — I10 Essential (primary) hypertension: Secondary | ICD-10-CM | POA: Diagnosis not present

## 2018-09-15 DIAGNOSIS — E46 Unspecified protein-calorie malnutrition: Secondary | ICD-10-CM | POA: Diagnosis not present

## 2018-09-15 DIAGNOSIS — I509 Heart failure, unspecified: Secondary | ICD-10-CM | POA: Diagnosis not present

## 2018-09-15 DIAGNOSIS — I4891 Unspecified atrial fibrillation: Secondary | ICD-10-CM | POA: Diagnosis not present

## 2018-09-15 DIAGNOSIS — N401 Enlarged prostate with lower urinary tract symptoms: Secondary | ICD-10-CM | POA: Diagnosis not present

## 2018-09-15 DIAGNOSIS — R634 Abnormal weight loss: Secondary | ICD-10-CM | POA: Diagnosis not present

## 2018-09-15 DIAGNOSIS — I679 Cerebrovascular disease, unspecified: Secondary | ICD-10-CM | POA: Diagnosis not present

## 2018-09-15 DIAGNOSIS — M109 Gout, unspecified: Secondary | ICD-10-CM | POA: Diagnosis not present

## 2018-09-15 DIAGNOSIS — I739 Peripheral vascular disease, unspecified: Secondary | ICD-10-CM | POA: Diagnosis not present

## 2018-09-15 DIAGNOSIS — G9009 Other idiopathic peripheral autonomic neuropathy: Secondary | ICD-10-CM | POA: Diagnosis not present

## 2018-09-17 DIAGNOSIS — I739 Peripheral vascular disease, unspecified: Secondary | ICD-10-CM | POA: Diagnosis not present

## 2018-09-17 DIAGNOSIS — E46 Unspecified protein-calorie malnutrition: Secondary | ICD-10-CM | POA: Diagnosis not present

## 2018-09-17 DIAGNOSIS — I4891 Unspecified atrial fibrillation: Secondary | ICD-10-CM | POA: Diagnosis not present

## 2018-09-17 DIAGNOSIS — I679 Cerebrovascular disease, unspecified: Secondary | ICD-10-CM | POA: Diagnosis not present

## 2018-09-17 DIAGNOSIS — G9009 Other idiopathic peripheral autonomic neuropathy: Secondary | ICD-10-CM | POA: Diagnosis not present

## 2018-09-17 DIAGNOSIS — I509 Heart failure, unspecified: Secondary | ICD-10-CM | POA: Diagnosis not present

## 2018-09-18 ENCOUNTER — Ambulatory Visit: Payer: Medicare Other | Admitting: Family Medicine

## 2018-09-18 ENCOUNTER — Telehealth: Payer: Self-pay | Admitting: Family Medicine

## 2018-09-18 DIAGNOSIS — I10 Essential (primary) hypertension: Secondary | ICD-10-CM

## 2018-09-18 DIAGNOSIS — R531 Weakness: Secondary | ICD-10-CM | POA: Diagnosis not present

## 2018-09-18 DIAGNOSIS — G9009 Other idiopathic peripheral autonomic neuropathy: Secondary | ICD-10-CM | POA: Diagnosis not present

## 2018-09-18 DIAGNOSIS — I739 Peripheral vascular disease, unspecified: Secondary | ICD-10-CM | POA: Diagnosis not present

## 2018-09-18 DIAGNOSIS — M170 Bilateral primary osteoarthritis of knee: Secondary | ICD-10-CM

## 2018-09-18 DIAGNOSIS — I509 Heart failure, unspecified: Secondary | ICD-10-CM | POA: Diagnosis not present

## 2018-09-18 DIAGNOSIS — E46 Unspecified protein-calorie malnutrition: Secondary | ICD-10-CM | POA: Diagnosis not present

## 2018-09-18 DIAGNOSIS — E118 Type 2 diabetes mellitus with unspecified complications: Secondary | ICD-10-CM | POA: Diagnosis not present

## 2018-09-18 DIAGNOSIS — I4891 Unspecified atrial fibrillation: Secondary | ICD-10-CM | POA: Diagnosis not present

## 2018-09-18 DIAGNOSIS — I679 Cerebrovascular disease, unspecified: Secondary | ICD-10-CM | POA: Diagnosis not present

## 2018-09-18 NOTE — Telephone Encounter (Signed)
Add on pt to 5 pm slot  Draw up meds for two knee injections.    I'll see at six pm  Call [facility] and have them run a copy of pt's current meds, blood pressures, and sugar results to have in patient's room when I see (talk to nurse) also blank order sheet in case I order anything different

## 2018-09-18 NOTE — Telephone Encounter (Signed)
Ms. Christian Thomas is calling to confirm if Dr. Richardson Landry is coming to see Christian Thomas today to give the injections in his knees and if so time frame of when to expect him. CB# [id number].

## 2018-09-18 NOTE — Telephone Encounter (Signed)
Injections pulled up and give to Dr. Ulyses Southward pt niece is aware Dr. Aletha Halim [facility] around 6 pm.

## 2018-09-18 NOTE — Telephone Encounter (Signed)
hailey at [facility] aware to place pt in the 5 pm slot. I called [name] and spoke with Sharyn Lull is aware of all the directions below.

## 2018-09-19 ENCOUNTER — Encounter: Payer: Self-pay | Admitting: Family Medicine

## 2018-09-19 DIAGNOSIS — G9009 Other idiopathic peripheral autonomic neuropathy: Secondary | ICD-10-CM | POA: Diagnosis not present

## 2018-09-19 DIAGNOSIS — E46 Unspecified protein-calorie malnutrition: Secondary | ICD-10-CM | POA: Diagnosis not present

## 2018-09-19 DIAGNOSIS — I679 Cerebrovascular disease, unspecified: Secondary | ICD-10-CM | POA: Diagnosis not present

## 2018-09-19 DIAGNOSIS — I4891 Unspecified atrial fibrillation: Secondary | ICD-10-CM | POA: Diagnosis not present

## 2018-09-19 DIAGNOSIS — I739 Peripheral vascular disease, unspecified: Secondary | ICD-10-CM | POA: Diagnosis not present

## 2018-09-19 DIAGNOSIS — I509 Heart failure, unspecified: Secondary | ICD-10-CM | POA: Diagnosis not present

## 2018-09-19 MED ORDER — METHYLPREDNISOLONE ACETATE 40 MG/ML IJ SUSP: 40.0000 mg | Freq: Once | INTRAMUSCULAR | Status: AC

## 2018-09-19 NOTE — Progress Notes (Signed)
09/19/2018 patient seen at the rest home.  Still having periods of cough and congestion.  Still having wheeziness.  Getting a couple breathing treatments per day.  Backoff on his blood pressure medicine.  Review of blood pressures reveal they are all still in decent control at this time.  Patient has known diabetes.  His oral intake is been off and on.  Sugars are maintaining a decent level.  Patient having ongoing neuropathic pain in the right foot.  See prior notes.  He is on several medications for this.  Unfortunately tramadol makes him tired.  Also substantial discussion about patient's general frailty.  After his pneumonia this spring he has been left extremely weak.  No longer walking.  Pulmonary status is really touching good day today.  Family reports increased frequency.  And occasional odor to the urine.  I have encouraged him not to keep checking his urine very regularly because this will lead to overuse of antibiotics.  Vitals stable blood pressure good lungs clear occasional deep cough heart regular rhythm mild drowsiness abdomen benign feet pulses intact sensation loss both feet  Procedure patient was prepped draped and anesthetized both knees injected with 1 cc 2cc  Impression hypertension stable to maintain same therapy  2.  Type 2 diabetes stable to maintain same therapy  3 chronic reactive airways.  Ongoing need for meds discussed  4.  Foot neuropathy ongoing severe maintain  5.  General frailty patient is no code.  Also has hospice involved to maintain.  Will recheck again in several weeks

## 2018-09-20 DIAGNOSIS — I509 Heart failure, unspecified: Secondary | ICD-10-CM | POA: Diagnosis not present

## 2018-09-20 DIAGNOSIS — I739 Peripheral vascular disease, unspecified: Secondary | ICD-10-CM | POA: Diagnosis not present

## 2018-09-20 DIAGNOSIS — G9009 Other idiopathic peripheral autonomic neuropathy: Secondary | ICD-10-CM | POA: Diagnosis not present

## 2018-09-20 DIAGNOSIS — I679 Cerebrovascular disease, unspecified: Secondary | ICD-10-CM | POA: Diagnosis not present

## 2018-09-20 DIAGNOSIS — E46 Unspecified protein-calorie malnutrition: Secondary | ICD-10-CM | POA: Diagnosis not present

## 2018-09-20 DIAGNOSIS — I4891 Unspecified atrial fibrillation: Secondary | ICD-10-CM | POA: Diagnosis not present

## 2018-09-22 ENCOUNTER — Other Ambulatory Visit: Payer: Self-pay

## 2018-09-22 ENCOUNTER — Telehealth: Payer: Self-pay | Admitting: Family Medicine

## 2018-09-22 DIAGNOSIS — I509 Heart failure, unspecified: Secondary | ICD-10-CM | POA: Diagnosis not present

## 2018-09-22 DIAGNOSIS — I739 Peripheral vascular disease, unspecified: Secondary | ICD-10-CM | POA: Diagnosis not present

## 2018-09-22 DIAGNOSIS — I679 Cerebrovascular disease, unspecified: Secondary | ICD-10-CM | POA: Diagnosis not present

## 2018-09-22 DIAGNOSIS — E46 Unspecified protein-calorie malnutrition: Secondary | ICD-10-CM | POA: Diagnosis not present

## 2018-09-22 DIAGNOSIS — I4891 Unspecified atrial fibrillation: Secondary | ICD-10-CM | POA: Diagnosis not present

## 2018-09-22 DIAGNOSIS — G9009 Other idiopathic peripheral autonomic neuropathy: Secondary | ICD-10-CM | POA: Diagnosis not present

## 2018-09-22 MED ORDER — ALPRAZOLAM 0.25 MG PO TABS: ORAL_TABLET | ORAL | 5 refills | Status: AC

## 2018-09-22 NOTE — Telephone Encounter (Signed)
Fax from [facility] stating that patient has had increased agitation and confusion. Contacted [facility] to see what patient current dose is per Dr.Steve. Nurse at [facility] stated 0.25 mg. One tab by mouth 2 times a day also has PRN order for one tab as needed BID. Per Dr.Steve; new order 0.25 TID, one in Am, one in evening and one at night. May add one additional if necessary daily. Contacted [facility] to inform them of the new order, sent medication in. Tried to contact niece to inform her of change but her voicemail is not set up.

## 2018-09-25 DIAGNOSIS — I679 Cerebrovascular disease, unspecified: Secondary | ICD-10-CM | POA: Diagnosis not present

## 2018-09-25 DIAGNOSIS — G9009 Other idiopathic peripheral autonomic neuropathy: Secondary | ICD-10-CM | POA: Diagnosis not present

## 2018-09-25 DIAGNOSIS — I739 Peripheral vascular disease, unspecified: Secondary | ICD-10-CM | POA: Diagnosis not present

## 2018-09-25 DIAGNOSIS — I4891 Unspecified atrial fibrillation: Secondary | ICD-10-CM | POA: Diagnosis not present

## 2018-09-25 DIAGNOSIS — E46 Unspecified protein-calorie malnutrition: Secondary | ICD-10-CM | POA: Diagnosis not present

## 2018-09-25 DIAGNOSIS — I509 Heart failure, unspecified: Secondary | ICD-10-CM | POA: Diagnosis not present

## 2018-09-26 DIAGNOSIS — I679 Cerebrovascular disease, unspecified: Secondary | ICD-10-CM | POA: Diagnosis not present

## 2018-09-26 DIAGNOSIS — I4891 Unspecified atrial fibrillation: Secondary | ICD-10-CM | POA: Diagnosis not present

## 2018-09-26 DIAGNOSIS — I739 Peripheral vascular disease, unspecified: Secondary | ICD-10-CM | POA: Diagnosis not present

## 2018-09-26 DIAGNOSIS — E46 Unspecified protein-calorie malnutrition: Secondary | ICD-10-CM | POA: Diagnosis not present

## 2018-09-26 DIAGNOSIS — G9009 Other idiopathic peripheral autonomic neuropathy: Secondary | ICD-10-CM | POA: Diagnosis not present

## 2018-09-26 DIAGNOSIS — I509 Heart failure, unspecified: Secondary | ICD-10-CM | POA: Diagnosis not present

## 2018-09-27 DIAGNOSIS — I739 Peripheral vascular disease, unspecified: Secondary | ICD-10-CM | POA: Diagnosis not present

## 2018-09-27 DIAGNOSIS — E46 Unspecified protein-calorie malnutrition: Secondary | ICD-10-CM | POA: Diagnosis not present

## 2018-09-27 DIAGNOSIS — I509 Heart failure, unspecified: Secondary | ICD-10-CM | POA: Diagnosis not present

## 2018-09-27 DIAGNOSIS — I679 Cerebrovascular disease, unspecified: Secondary | ICD-10-CM | POA: Diagnosis not present

## 2018-09-27 DIAGNOSIS — G9009 Other idiopathic peripheral autonomic neuropathy: Secondary | ICD-10-CM | POA: Diagnosis not present

## 2018-09-27 DIAGNOSIS — I4891 Unspecified atrial fibrillation: Secondary | ICD-10-CM | POA: Diagnosis not present

## 2018-09-28 ENCOUNTER — Encounter: Payer: Self-pay | Admitting: Family Medicine

## 2018-09-29 DIAGNOSIS — I4891 Unspecified atrial fibrillation: Secondary | ICD-10-CM | POA: Diagnosis not present

## 2018-09-29 DIAGNOSIS — I509 Heart failure, unspecified: Secondary | ICD-10-CM | POA: Diagnosis not present

## 2018-09-29 DIAGNOSIS — I739 Peripheral vascular disease, unspecified: Secondary | ICD-10-CM | POA: Diagnosis not present

## 2018-09-29 DIAGNOSIS — I679 Cerebrovascular disease, unspecified: Secondary | ICD-10-CM | POA: Diagnosis not present

## 2018-09-29 DIAGNOSIS — E46 Unspecified protein-calorie malnutrition: Secondary | ICD-10-CM | POA: Diagnosis not present

## 2018-09-29 DIAGNOSIS — G9009 Other idiopathic peripheral autonomic neuropathy: Secondary | ICD-10-CM | POA: Diagnosis not present

## 2018-10-02 ENCOUNTER — Telehealth: Payer: Self-pay | Admitting: Family Medicine

## 2018-10-02 DIAGNOSIS — I4891 Unspecified atrial fibrillation: Secondary | ICD-10-CM | POA: Diagnosis not present

## 2018-10-02 DIAGNOSIS — E46 Unspecified protein-calorie malnutrition: Secondary | ICD-10-CM | POA: Diagnosis not present

## 2018-10-02 DIAGNOSIS — I509 Heart failure, unspecified: Secondary | ICD-10-CM | POA: Diagnosis not present

## 2018-10-02 DIAGNOSIS — I739 Peripheral vascular disease, unspecified: Secondary | ICD-10-CM | POA: Diagnosis not present

## 2018-10-02 DIAGNOSIS — G9009 Other idiopathic peripheral autonomic neuropathy: Secondary | ICD-10-CM | POA: Diagnosis not present

## 2018-10-02 DIAGNOSIS — I679 Cerebrovascular disease, unspecified: Secondary | ICD-10-CM | POA: Diagnosis not present

## 2018-10-02 NOTE — Telephone Encounter (Signed)
Sharyn Lull from [facility] called because they found an empty syringe in Christian Thomas's room for [facility] that is usually used to treat migraines.  They don't have this as one of his medicines and they didn't know if Dr. Richardson Landry had given him this last time he was there? I told her Dr. Richardson Landry wasn't in so we would have to let them know tomorrow.

## 2018-10-02 NOTE — Telephone Encounter (Signed)
Definitely did not, I brought two syringes with xylocaine and two with steroids for knee injections and took them with me after finished

## 2018-10-02 NOTE — Telephone Encounter (Signed)
Left message return call for Christian Thomas (she is currently in a meeting)

## 2018-10-03 DIAGNOSIS — I4891 Unspecified atrial fibrillation: Secondary | ICD-10-CM | POA: Diagnosis not present

## 2018-10-03 DIAGNOSIS — I509 Heart failure, unspecified: Secondary | ICD-10-CM | POA: Diagnosis not present

## 2018-10-03 DIAGNOSIS — E46 Unspecified protein-calorie malnutrition: Secondary | ICD-10-CM | POA: Diagnosis not present

## 2018-10-03 DIAGNOSIS — I739 Peripheral vascular disease, unspecified: Secondary | ICD-10-CM | POA: Diagnosis not present

## 2018-10-03 DIAGNOSIS — I679 Cerebrovascular disease, unspecified: Secondary | ICD-10-CM | POA: Diagnosis not present

## 2018-10-03 DIAGNOSIS — G9009 Other idiopathic peripheral autonomic neuropathy: Secondary | ICD-10-CM | POA: Diagnosis not present

## 2018-10-04 DIAGNOSIS — E46 Unspecified protein-calorie malnutrition: Secondary | ICD-10-CM | POA: Diagnosis not present

## 2018-10-04 DIAGNOSIS — I739 Peripheral vascular disease, unspecified: Secondary | ICD-10-CM | POA: Diagnosis not present

## 2018-10-04 DIAGNOSIS — I4891 Unspecified atrial fibrillation: Secondary | ICD-10-CM | POA: Diagnosis not present

## 2018-10-04 DIAGNOSIS — I679 Cerebrovascular disease, unspecified: Secondary | ICD-10-CM | POA: Diagnosis not present

## 2018-10-04 DIAGNOSIS — I509 Heart failure, unspecified: Secondary | ICD-10-CM | POA: Diagnosis not present

## 2018-10-04 DIAGNOSIS — G9009 Other idiopathic peripheral autonomic neuropathy: Secondary | ICD-10-CM | POA: Diagnosis not present

## 2018-10-04 NOTE — Telephone Encounter (Signed)
Discussed with a nurse at [facility] and she states they found out it was one of his sitters that had the syringe and left it there

## 2018-10-04 NOTE — Telephone Encounter (Signed)
k

## 2018-10-06 DIAGNOSIS — E46 Unspecified protein-calorie malnutrition: Secondary | ICD-10-CM | POA: Diagnosis not present

## 2018-10-06 DIAGNOSIS — I679 Cerebrovascular disease, unspecified: Secondary | ICD-10-CM | POA: Diagnosis not present

## 2018-10-06 DIAGNOSIS — I739 Peripheral vascular disease, unspecified: Secondary | ICD-10-CM | POA: Diagnosis not present

## 2018-10-06 DIAGNOSIS — I4891 Unspecified atrial fibrillation: Secondary | ICD-10-CM | POA: Diagnosis not present

## 2018-10-06 DIAGNOSIS — G9009 Other idiopathic peripheral autonomic neuropathy: Secondary | ICD-10-CM | POA: Diagnosis not present

## 2018-10-06 DIAGNOSIS — I509 Heart failure, unspecified: Secondary | ICD-10-CM | POA: Diagnosis not present

## 2018-10-09 DIAGNOSIS — I4891 Unspecified atrial fibrillation: Secondary | ICD-10-CM | POA: Diagnosis not present

## 2018-10-09 DIAGNOSIS — I679 Cerebrovascular disease, unspecified: Secondary | ICD-10-CM | POA: Diagnosis not present

## 2018-10-09 DIAGNOSIS — I739 Peripheral vascular disease, unspecified: Secondary | ICD-10-CM | POA: Diagnosis not present

## 2018-10-09 DIAGNOSIS — G9009 Other idiopathic peripheral autonomic neuropathy: Secondary | ICD-10-CM | POA: Diagnosis not present

## 2018-10-09 DIAGNOSIS — I509 Heart failure, unspecified: Secondary | ICD-10-CM | POA: Diagnosis not present

## 2018-10-09 DIAGNOSIS — E46 Unspecified protein-calorie malnutrition: Secondary | ICD-10-CM | POA: Diagnosis not present

## 2018-10-10 DIAGNOSIS — G9009 Other idiopathic peripheral autonomic neuropathy: Secondary | ICD-10-CM | POA: Diagnosis not present

## 2018-10-10 DIAGNOSIS — I509 Heart failure, unspecified: Secondary | ICD-10-CM | POA: Diagnosis not present

## 2018-10-10 DIAGNOSIS — E46 Unspecified protein-calorie malnutrition: Secondary | ICD-10-CM | POA: Diagnosis not present

## 2018-10-10 DIAGNOSIS — I739 Peripheral vascular disease, unspecified: Secondary | ICD-10-CM | POA: Diagnosis not present

## 2018-10-10 DIAGNOSIS — I679 Cerebrovascular disease, unspecified: Secondary | ICD-10-CM | POA: Diagnosis not present

## 2018-10-10 DIAGNOSIS — I4891 Unspecified atrial fibrillation: Secondary | ICD-10-CM | POA: Diagnosis not present

## 2018-10-11 ENCOUNTER — Ambulatory Visit: Payer: Medicare Other | Admitting: Family Medicine

## 2018-10-11 DIAGNOSIS — I509 Heart failure, unspecified: Secondary | ICD-10-CM | POA: Diagnosis not present

## 2018-10-11 DIAGNOSIS — E46 Unspecified protein-calorie malnutrition: Secondary | ICD-10-CM | POA: Diagnosis not present

## 2018-10-11 DIAGNOSIS — G9009 Other idiopathic peripheral autonomic neuropathy: Secondary | ICD-10-CM | POA: Diagnosis not present

## 2018-10-11 DIAGNOSIS — E118 Type 2 diabetes mellitus with unspecified complications: Secondary | ICD-10-CM

## 2018-10-11 DIAGNOSIS — F321 Major depressive disorder, single episode, moderate: Secondary | ICD-10-CM | POA: Diagnosis not present

## 2018-10-11 DIAGNOSIS — I10 Essential (primary) hypertension: Secondary | ICD-10-CM | POA: Diagnosis not present

## 2018-10-11 DIAGNOSIS — I739 Peripheral vascular disease, unspecified: Secondary | ICD-10-CM | POA: Diagnosis not present

## 2018-10-11 DIAGNOSIS — I4891 Unspecified atrial fibrillation: Secondary | ICD-10-CM | POA: Diagnosis not present

## 2018-10-11 DIAGNOSIS — K5901 Slow transit constipation: Secondary | ICD-10-CM

## 2018-10-11 DIAGNOSIS — R531 Weakness: Secondary | ICD-10-CM

## 2018-10-11 DIAGNOSIS — I679 Cerebrovascular disease, unspecified: Secondary | ICD-10-CM | POA: Diagnosis not present

## 2018-10-11 DIAGNOSIS — M170 Bilateral primary osteoarthritis of knee: Secondary | ICD-10-CM

## 2018-10-13 ENCOUNTER — Other Ambulatory Visit: Payer: Self-pay | Admitting: *Deleted

## 2018-10-13 ENCOUNTER — Telehealth: Payer: Self-pay | Admitting: *Deleted

## 2018-10-13 DIAGNOSIS — I679 Cerebrovascular disease, unspecified: Secondary | ICD-10-CM | POA: Diagnosis not present

## 2018-10-13 DIAGNOSIS — I509 Heart failure, unspecified: Secondary | ICD-10-CM | POA: Diagnosis not present

## 2018-10-13 DIAGNOSIS — G9009 Other idiopathic peripheral autonomic neuropathy: Secondary | ICD-10-CM | POA: Diagnosis not present

## 2018-10-13 DIAGNOSIS — E46 Unspecified protein-calorie malnutrition: Secondary | ICD-10-CM | POA: Diagnosis not present

## 2018-10-13 DIAGNOSIS — I4891 Unspecified atrial fibrillation: Secondary | ICD-10-CM | POA: Diagnosis not present

## 2018-10-13 DIAGNOSIS — I739 Peripheral vascular disease, unspecified: Secondary | ICD-10-CM | POA: Diagnosis not present

## 2018-10-13 MED ORDER — OXYCODONE HCL 5 MG PO TABA: ORAL_TABLET | ORAL | 0 refills | Status: DC

## 2018-10-13 MED ORDER — OXYCODONE HCL 5 MG PO TABS: ORAL_TABLET | ORAL | 0 refills | Status: AC

## 2018-10-13 MED ORDER — OXYCODONE HCL 5 MG PO TABS: 5.0000 mg | ORAL_TABLET | Freq: Three times a day (TID) | ORAL | 0 refills | Status: DC

## 2018-10-13 NOTE — Telephone Encounter (Signed)
Dr Richardson Landry requested a message to be sent back on patient.

## 2018-10-13 NOTE — Telephone Encounter (Signed)
o voice and you all need to be my go between ( I had told her id call her)  I think we should stop the tramadol and place mr rumlye on oxycodone 5 mg one up to three tinmes per day  There was an order request from the nurses toi move eve meds back to 6 pm because pt was getting excessively sleep and could not finish his supper, I signed it, but I can unsigtn, what is her thought about this? Stay with 4 pm adiministration or move back to more the traditional 6 pm?

## 2018-10-13 NOTE — Telephone Encounter (Signed)
Ok to put not allergic to oxycodone on script per dr Richardson Landry. Orders faxed to [facility]. [name] and med tech at [facility] was notified of all

## 2018-10-13 NOTE — Telephone Encounter (Signed)
good

## 2018-10-13 NOTE — Telephone Encounter (Signed)
Discussed with [name] pt's niece and she does wants evenings meds changed to 6pm. States pt was having blood in his urine with every void last night and hospice nurse wanted order for ua and urine culture. Spoke with hospice nurse Mrs. Withers and gave verbal order per dr Richardson Landry but also let her know that dr Richardson Landry had discussion with family about pt having bacterial in his urine and he will probably would not be prescribing antibiotics. She verbalized understanding. [name] also ok with change from tradmadol to oxycodone. Called [facility] and they state oxycodone was sent in yesterday. But the pharm would not fill due to pt being allergic to morphine. They need a note on scipt stating pt is not allergic to oxycodone and sent to pharm. Is it ok to add this note to rx

## 2018-10-13 NOTE — Progress Notes (Signed)
11 29  Pt seen at rest home,   Per nh too drowsy in eve to eat supper, wondered if they ould move meds back to q 6pm  Pt notes sig foot pain, right foot as always, pt on max ancillary  Neuropathy rx. Tramadol no longer helping enoug  Cough has improved some, tho still has some on occasion    See meds   See [facility]   Labs, nurse notes reviewed   BP 132 78, afeb , pulse 80   Alert, mod malaise   heent baseline  Lungs no crackles, occas bronchial cough, no tachypnea, heart rrr Feet dim sens bilat, pulses present but diminshed  Imp htn stable  DM glu decent  Neuropathy-considerable, will hold ultram, institute oxycodone  Chronic cough, stable but fragile pulm status  Hospice notes reviewed

## 2018-10-15 DIAGNOSIS — J309 Allergic rhinitis, unspecified: Secondary | ICD-10-CM | POA: Diagnosis not present

## 2018-10-15 DIAGNOSIS — R54 Age-related physical debility: Secondary | ICD-10-CM | POA: Diagnosis not present

## 2018-10-15 DIAGNOSIS — I679 Cerebrovascular disease, unspecified: Secondary | ICD-10-CM | POA: Diagnosis not present

## 2018-10-15 DIAGNOSIS — R634 Abnormal weight loss: Secondary | ICD-10-CM | POA: Diagnosis not present

## 2018-10-15 DIAGNOSIS — E46 Unspecified protein-calorie malnutrition: Secondary | ICD-10-CM | POA: Diagnosis not present

## 2018-10-15 DIAGNOSIS — E1159 Type 2 diabetes mellitus with other circulatory complications: Secondary | ICD-10-CM | POA: Diagnosis not present

## 2018-10-15 DIAGNOSIS — R1310 Dysphagia, unspecified: Secondary | ICD-10-CM | POA: Diagnosis not present

## 2018-10-15 DIAGNOSIS — I739 Peripheral vascular disease, unspecified: Secondary | ICD-10-CM | POA: Diagnosis not present

## 2018-10-15 DIAGNOSIS — I509 Heart failure, unspecified: Secondary | ICD-10-CM | POA: Diagnosis not present

## 2018-10-15 DIAGNOSIS — I4891 Unspecified atrial fibrillation: Secondary | ICD-10-CM | POA: Diagnosis not present

## 2018-10-15 DIAGNOSIS — M109 Gout, unspecified: Secondary | ICD-10-CM | POA: Diagnosis not present

## 2018-10-15 DIAGNOSIS — G9009 Other idiopathic peripheral autonomic neuropathy: Secondary | ICD-10-CM | POA: Diagnosis not present

## 2018-10-15 DIAGNOSIS — I10 Essential (primary) hypertension: Secondary | ICD-10-CM | POA: Diagnosis not present

## 2018-10-15 DIAGNOSIS — E785 Hyperlipidemia, unspecified: Secondary | ICD-10-CM | POA: Diagnosis not present

## 2018-10-15 DIAGNOSIS — N401 Enlarged prostate with lower urinary tract symptoms: Secondary | ICD-10-CM | POA: Diagnosis not present

## 2018-10-16 DIAGNOSIS — G9009 Other idiopathic peripheral autonomic neuropathy: Secondary | ICD-10-CM | POA: Diagnosis not present

## 2018-10-16 DIAGNOSIS — I679 Cerebrovascular disease, unspecified: Secondary | ICD-10-CM | POA: Diagnosis not present

## 2018-10-16 DIAGNOSIS — I509 Heart failure, unspecified: Secondary | ICD-10-CM | POA: Diagnosis not present

## 2018-10-16 DIAGNOSIS — I4891 Unspecified atrial fibrillation: Secondary | ICD-10-CM | POA: Diagnosis not present

## 2018-10-16 DIAGNOSIS — I739 Peripheral vascular disease, unspecified: Secondary | ICD-10-CM | POA: Diagnosis not present

## 2018-10-16 DIAGNOSIS — E46 Unspecified protein-calorie malnutrition: Secondary | ICD-10-CM | POA: Diagnosis not present

## 2018-10-17 DIAGNOSIS — I679 Cerebrovascular disease, unspecified: Secondary | ICD-10-CM | POA: Diagnosis not present

## 2018-10-17 DIAGNOSIS — E46 Unspecified protein-calorie malnutrition: Secondary | ICD-10-CM | POA: Diagnosis not present

## 2018-10-17 DIAGNOSIS — I509 Heart failure, unspecified: Secondary | ICD-10-CM | POA: Diagnosis not present

## 2018-10-17 DIAGNOSIS — G9009 Other idiopathic peripheral autonomic neuropathy: Secondary | ICD-10-CM | POA: Diagnosis not present

## 2018-10-17 DIAGNOSIS — I739 Peripheral vascular disease, unspecified: Secondary | ICD-10-CM | POA: Diagnosis not present

## 2018-10-17 DIAGNOSIS — I4891 Unspecified atrial fibrillation: Secondary | ICD-10-CM | POA: Diagnosis not present

## 2018-10-18 DIAGNOSIS — I4891 Unspecified atrial fibrillation: Secondary | ICD-10-CM | POA: Diagnosis not present

## 2018-10-18 DIAGNOSIS — I509 Heart failure, unspecified: Secondary | ICD-10-CM | POA: Diagnosis not present

## 2018-10-18 DIAGNOSIS — E46 Unspecified protein-calorie malnutrition: Secondary | ICD-10-CM | POA: Diagnosis not present

## 2018-10-18 DIAGNOSIS — I679 Cerebrovascular disease, unspecified: Secondary | ICD-10-CM | POA: Diagnosis not present

## 2018-10-18 DIAGNOSIS — I739 Peripheral vascular disease, unspecified: Secondary | ICD-10-CM | POA: Diagnosis not present

## 2018-10-18 DIAGNOSIS — G9009 Other idiopathic peripheral autonomic neuropathy: Secondary | ICD-10-CM | POA: Diagnosis not present

## 2018-10-19 ENCOUNTER — Ambulatory Visit (INDEPENDENT_AMBULATORY_CARE_PROVIDER_SITE_OTHER): Payer: Medicare Other | Admitting: Otolaryngology

## 2018-10-19 DIAGNOSIS — H6123 Impacted cerumen, bilateral: Secondary | ICD-10-CM | POA: Diagnosis not present

## 2018-10-20 DIAGNOSIS — I739 Peripheral vascular disease, unspecified: Secondary | ICD-10-CM | POA: Diagnosis not present

## 2018-10-20 DIAGNOSIS — I679 Cerebrovascular disease, unspecified: Secondary | ICD-10-CM | POA: Diagnosis not present

## 2018-10-20 DIAGNOSIS — G9009 Other idiopathic peripheral autonomic neuropathy: Secondary | ICD-10-CM | POA: Diagnosis not present

## 2018-10-20 DIAGNOSIS — I509 Heart failure, unspecified: Secondary | ICD-10-CM | POA: Diagnosis not present

## 2018-10-20 DIAGNOSIS — I4891 Unspecified atrial fibrillation: Secondary | ICD-10-CM | POA: Diagnosis not present

## 2018-10-20 DIAGNOSIS — E46 Unspecified protein-calorie malnutrition: Secondary | ICD-10-CM | POA: Diagnosis not present

## 2018-10-23 DIAGNOSIS — I679 Cerebrovascular disease, unspecified: Secondary | ICD-10-CM | POA: Diagnosis not present

## 2018-10-23 DIAGNOSIS — I509 Heart failure, unspecified: Secondary | ICD-10-CM | POA: Diagnosis not present

## 2018-10-23 DIAGNOSIS — E46 Unspecified protein-calorie malnutrition: Secondary | ICD-10-CM | POA: Diagnosis not present

## 2018-10-23 DIAGNOSIS — I739 Peripheral vascular disease, unspecified: Secondary | ICD-10-CM | POA: Diagnosis not present

## 2018-10-23 DIAGNOSIS — G9009 Other idiopathic peripheral autonomic neuropathy: Secondary | ICD-10-CM | POA: Diagnosis not present

## 2018-10-23 DIAGNOSIS — I4891 Unspecified atrial fibrillation: Secondary | ICD-10-CM | POA: Diagnosis not present

## 2018-10-24 DIAGNOSIS — I509 Heart failure, unspecified: Secondary | ICD-10-CM | POA: Diagnosis not present

## 2018-10-24 DIAGNOSIS — I4891 Unspecified atrial fibrillation: Secondary | ICD-10-CM | POA: Diagnosis not present

## 2018-10-24 DIAGNOSIS — I739 Peripheral vascular disease, unspecified: Secondary | ICD-10-CM | POA: Diagnosis not present

## 2018-10-24 DIAGNOSIS — G9009 Other idiopathic peripheral autonomic neuropathy: Secondary | ICD-10-CM | POA: Diagnosis not present

## 2018-10-24 DIAGNOSIS — E46 Unspecified protein-calorie malnutrition: Secondary | ICD-10-CM | POA: Diagnosis not present

## 2018-10-24 DIAGNOSIS — I679 Cerebrovascular disease, unspecified: Secondary | ICD-10-CM | POA: Diagnosis not present

## 2018-10-25 DIAGNOSIS — I739 Peripheral vascular disease, unspecified: Secondary | ICD-10-CM | POA: Diagnosis not present

## 2018-10-25 DIAGNOSIS — I4891 Unspecified atrial fibrillation: Secondary | ICD-10-CM | POA: Diagnosis not present

## 2018-10-25 DIAGNOSIS — I679 Cerebrovascular disease, unspecified: Secondary | ICD-10-CM | POA: Diagnosis not present

## 2018-10-25 DIAGNOSIS — G9009 Other idiopathic peripheral autonomic neuropathy: Secondary | ICD-10-CM | POA: Diagnosis not present

## 2018-10-25 DIAGNOSIS — I509 Heart failure, unspecified: Secondary | ICD-10-CM | POA: Diagnosis not present

## 2018-10-25 DIAGNOSIS — E46 Unspecified protein-calorie malnutrition: Secondary | ICD-10-CM | POA: Diagnosis not present

## 2018-10-27 DIAGNOSIS — I4891 Unspecified atrial fibrillation: Secondary | ICD-10-CM | POA: Diagnosis not present

## 2018-10-27 DIAGNOSIS — G9009 Other idiopathic peripheral autonomic neuropathy: Secondary | ICD-10-CM | POA: Diagnosis not present

## 2018-10-27 DIAGNOSIS — I679 Cerebrovascular disease, unspecified: Secondary | ICD-10-CM | POA: Diagnosis not present

## 2018-10-27 DIAGNOSIS — I509 Heart failure, unspecified: Secondary | ICD-10-CM | POA: Diagnosis not present

## 2018-10-27 DIAGNOSIS — I739 Peripheral vascular disease, unspecified: Secondary | ICD-10-CM | POA: Diagnosis not present

## 2018-10-27 DIAGNOSIS — E46 Unspecified protein-calorie malnutrition: Secondary | ICD-10-CM | POA: Diagnosis not present

## 2018-10-30 DIAGNOSIS — I509 Heart failure, unspecified: Secondary | ICD-10-CM | POA: Diagnosis not present

## 2018-10-30 DIAGNOSIS — I679 Cerebrovascular disease, unspecified: Secondary | ICD-10-CM | POA: Diagnosis not present

## 2018-10-30 DIAGNOSIS — G9009 Other idiopathic peripheral autonomic neuropathy: Secondary | ICD-10-CM | POA: Diagnosis not present

## 2018-10-30 DIAGNOSIS — I739 Peripheral vascular disease, unspecified: Secondary | ICD-10-CM | POA: Diagnosis not present

## 2018-10-30 DIAGNOSIS — I4891 Unspecified atrial fibrillation: Secondary | ICD-10-CM | POA: Diagnosis not present

## 2018-10-30 DIAGNOSIS — E46 Unspecified protein-calorie malnutrition: Secondary | ICD-10-CM | POA: Diagnosis not present

## 2018-10-31 DIAGNOSIS — I509 Heart failure, unspecified: Secondary | ICD-10-CM | POA: Diagnosis not present

## 2018-10-31 DIAGNOSIS — I739 Peripheral vascular disease, unspecified: Secondary | ICD-10-CM | POA: Diagnosis not present

## 2018-10-31 DIAGNOSIS — E46 Unspecified protein-calorie malnutrition: Secondary | ICD-10-CM | POA: Diagnosis not present

## 2018-10-31 DIAGNOSIS — G9009 Other idiopathic peripheral autonomic neuropathy: Secondary | ICD-10-CM | POA: Diagnosis not present

## 2018-10-31 DIAGNOSIS — I4891 Unspecified atrial fibrillation: Secondary | ICD-10-CM | POA: Diagnosis not present

## 2018-10-31 DIAGNOSIS — I679 Cerebrovascular disease, unspecified: Secondary | ICD-10-CM | POA: Diagnosis not present

## 2018-11-01 DIAGNOSIS — I4891 Unspecified atrial fibrillation: Secondary | ICD-10-CM | POA: Diagnosis not present

## 2018-11-01 DIAGNOSIS — I739 Peripheral vascular disease, unspecified: Secondary | ICD-10-CM | POA: Diagnosis not present

## 2018-11-01 DIAGNOSIS — G9009 Other idiopathic peripheral autonomic neuropathy: Secondary | ICD-10-CM | POA: Diagnosis not present

## 2018-11-01 DIAGNOSIS — I509 Heart failure, unspecified: Secondary | ICD-10-CM | POA: Diagnosis not present

## 2018-11-01 DIAGNOSIS — E46 Unspecified protein-calorie malnutrition: Secondary | ICD-10-CM | POA: Diagnosis not present

## 2018-11-01 DIAGNOSIS — I679 Cerebrovascular disease, unspecified: Secondary | ICD-10-CM | POA: Diagnosis not present

## 2018-11-03 ENCOUNTER — Telehealth: Payer: Self-pay | Admitting: *Deleted

## 2018-11-03 DIAGNOSIS — I509 Heart failure, unspecified: Secondary | ICD-10-CM | POA: Diagnosis not present

## 2018-11-03 DIAGNOSIS — E46 Unspecified protein-calorie malnutrition: Secondary | ICD-10-CM | POA: Diagnosis not present

## 2018-11-03 DIAGNOSIS — I679 Cerebrovascular disease, unspecified: Secondary | ICD-10-CM | POA: Diagnosis not present

## 2018-11-03 DIAGNOSIS — G9009 Other idiopathic peripheral autonomic neuropathy: Secondary | ICD-10-CM | POA: Diagnosis not present

## 2018-11-03 DIAGNOSIS — I4891 Unspecified atrial fibrillation: Secondary | ICD-10-CM | POA: Diagnosis not present

## 2018-11-03 DIAGNOSIS — I739 Peripheral vascular disease, unspecified: Secondary | ICD-10-CM | POA: Diagnosis not present

## 2018-11-03 NOTE — Telephone Encounter (Signed)
[facility] nurse and informed her to hold metoprolol and continue to check bp three times per week with med held and see what the trend is over coming weeks. Hospice nurse to info and wrote orders as a phone message. Hospice nurse will report this to facility.

## 2018-11-03 NOTE — Telephone Encounter (Signed)
I really do not want to get into a twice dailoy measuring before every single dose, I think we have over focused on bp's in the past and that would only worsen things. With his weight loss his bp has been slowly dropping, would rec holding the metoprolol, cont to ck bp three times per week with the med held and we will see what the trend is over the coming weeks

## 2018-11-03 NOTE — Telephone Encounter (Signed)
[facility] hospice nurse called about pt. She was out to change a bandage and he was feeling tired. States she was told he is usually up and in the common area but this morning he didn't want to get up. He was sleeping in his chair when she came in. She checked bp and it was 90/50 usually runs higher. Systolic usually in the 947'X. He is taking metoprolol 12.5mg  bid and they only check bp three times a week. Wants to know if their should be some parameters to hold metoprolol if bp is low and to check bp before giving med.   She will be there until about 11am this morning. Can call her back on [phone].

## 2018-11-06 DIAGNOSIS — I739 Peripheral vascular disease, unspecified: Secondary | ICD-10-CM | POA: Diagnosis not present

## 2018-11-06 DIAGNOSIS — I679 Cerebrovascular disease, unspecified: Secondary | ICD-10-CM | POA: Diagnosis not present

## 2018-11-06 DIAGNOSIS — I509 Heart failure, unspecified: Secondary | ICD-10-CM | POA: Diagnosis not present

## 2018-11-06 DIAGNOSIS — E46 Unspecified protein-calorie malnutrition: Secondary | ICD-10-CM | POA: Diagnosis not present

## 2018-11-06 DIAGNOSIS — G9009 Other idiopathic peripheral autonomic neuropathy: Secondary | ICD-10-CM | POA: Diagnosis not present

## 2018-11-06 DIAGNOSIS — I4891 Unspecified atrial fibrillation: Secondary | ICD-10-CM | POA: Diagnosis not present

## 2018-11-07 DIAGNOSIS — I739 Peripheral vascular disease, unspecified: Secondary | ICD-10-CM | POA: Diagnosis not present

## 2018-11-07 DIAGNOSIS — I679 Cerebrovascular disease, unspecified: Secondary | ICD-10-CM | POA: Diagnosis not present

## 2018-11-07 DIAGNOSIS — I4891 Unspecified atrial fibrillation: Secondary | ICD-10-CM | POA: Diagnosis not present

## 2018-11-07 DIAGNOSIS — E46 Unspecified protein-calorie malnutrition: Secondary | ICD-10-CM | POA: Diagnosis not present

## 2018-11-07 DIAGNOSIS — R05 Cough: Secondary | ICD-10-CM | POA: Diagnosis not present

## 2018-11-07 DIAGNOSIS — G9009 Other idiopathic peripheral autonomic neuropathy: Secondary | ICD-10-CM | POA: Diagnosis not present

## 2018-11-07 DIAGNOSIS — I509 Heart failure, unspecified: Secondary | ICD-10-CM | POA: Diagnosis not present

## 2018-11-10 DIAGNOSIS — I679 Cerebrovascular disease, unspecified: Secondary | ICD-10-CM | POA: Diagnosis not present

## 2018-11-10 DIAGNOSIS — I4891 Unspecified atrial fibrillation: Secondary | ICD-10-CM | POA: Diagnosis not present

## 2018-11-10 DIAGNOSIS — I509 Heart failure, unspecified: Secondary | ICD-10-CM | POA: Diagnosis not present

## 2018-11-10 DIAGNOSIS — I739 Peripheral vascular disease, unspecified: Secondary | ICD-10-CM | POA: Diagnosis not present

## 2018-11-10 DIAGNOSIS — E46 Unspecified protein-calorie malnutrition: Secondary | ICD-10-CM | POA: Diagnosis not present

## 2018-11-10 DIAGNOSIS — G9009 Other idiopathic peripheral autonomic neuropathy: Secondary | ICD-10-CM | POA: Diagnosis not present

## 2018-11-13 ENCOUNTER — Telehealth: Payer: Self-pay

## 2018-11-13 DIAGNOSIS — I509 Heart failure, unspecified: Secondary | ICD-10-CM | POA: Diagnosis not present

## 2018-11-13 DIAGNOSIS — I4891 Unspecified atrial fibrillation: Secondary | ICD-10-CM | POA: Diagnosis not present

## 2018-11-13 DIAGNOSIS — I679 Cerebrovascular disease, unspecified: Secondary | ICD-10-CM | POA: Diagnosis not present

## 2018-11-13 DIAGNOSIS — G9009 Other idiopathic peripheral autonomic neuropathy: Secondary | ICD-10-CM | POA: Diagnosis not present

## 2018-11-13 DIAGNOSIS — I739 Peripheral vascular disease, unspecified: Secondary | ICD-10-CM | POA: Diagnosis not present

## 2018-11-13 DIAGNOSIS — E46 Unspecified protein-calorie malnutrition: Secondary | ICD-10-CM | POA: Diagnosis not present

## 2018-11-13 NOTE — Telephone Encounter (Signed)
[name] aware of all.

## 2018-11-13 NOTE — Telephone Encounter (Signed)
I tried calling Niece [name] , Vm is not set up. Unable to leave a message. I did ask the front office staff to put Mr.Rumbley on Dr Mariane Duval schedule for 11:30 per his request.

## 2018-11-14 ENCOUNTER — Ambulatory Visit: Payer: Medicare Other | Admitting: Family Medicine

## 2018-11-14 DIAGNOSIS — I509 Heart failure, unspecified: Secondary | ICD-10-CM | POA: Diagnosis not present

## 2018-11-14 DIAGNOSIS — I679 Cerebrovascular disease, unspecified: Secondary | ICD-10-CM | POA: Diagnosis not present

## 2018-11-14 DIAGNOSIS — I4891 Unspecified atrial fibrillation: Secondary | ICD-10-CM | POA: Diagnosis not present

## 2018-11-14 DIAGNOSIS — G9009 Other idiopathic peripheral autonomic neuropathy: Secondary | ICD-10-CM | POA: Diagnosis not present

## 2018-11-14 DIAGNOSIS — I739 Peripheral vascular disease, unspecified: Secondary | ICD-10-CM | POA: Diagnosis not present

## 2018-11-14 DIAGNOSIS — E46 Unspecified protein-calorie malnutrition: Secondary | ICD-10-CM | POA: Diagnosis not present

## 2018-11-15 DIAGNOSIS — R54 Age-related physical debility: Secondary | ICD-10-CM | POA: Diagnosis not present

## 2018-11-15 DIAGNOSIS — I679 Cerebrovascular disease, unspecified: Secondary | ICD-10-CM | POA: Diagnosis not present

## 2018-11-15 DIAGNOSIS — R634 Abnormal weight loss: Secondary | ICD-10-CM | POA: Diagnosis not present

## 2018-11-15 DIAGNOSIS — I509 Heart failure, unspecified: Secondary | ICD-10-CM | POA: Diagnosis not present

## 2018-11-15 DIAGNOSIS — E785 Hyperlipidemia, unspecified: Secondary | ICD-10-CM | POA: Diagnosis not present

## 2018-11-15 DIAGNOSIS — M109 Gout, unspecified: Secondary | ICD-10-CM | POA: Diagnosis not present

## 2018-11-15 DIAGNOSIS — N401 Enlarged prostate with lower urinary tract symptoms: Secondary | ICD-10-CM | POA: Diagnosis not present

## 2018-11-15 DIAGNOSIS — G9009 Other idiopathic peripheral autonomic neuropathy: Secondary | ICD-10-CM | POA: Diagnosis not present

## 2018-11-15 DIAGNOSIS — E46 Unspecified protein-calorie malnutrition: Secondary | ICD-10-CM | POA: Diagnosis not present

## 2018-11-15 DIAGNOSIS — E1159 Type 2 diabetes mellitus with other circulatory complications: Secondary | ICD-10-CM | POA: Diagnosis not present

## 2018-11-15 DIAGNOSIS — I4891 Unspecified atrial fibrillation: Secondary | ICD-10-CM | POA: Diagnosis not present

## 2018-11-15 DIAGNOSIS — I10 Essential (primary) hypertension: Secondary | ICD-10-CM | POA: Diagnosis not present

## 2018-11-15 DIAGNOSIS — I739 Peripheral vascular disease, unspecified: Secondary | ICD-10-CM | POA: Diagnosis not present

## 2018-11-15 DIAGNOSIS — J309 Allergic rhinitis, unspecified: Secondary | ICD-10-CM | POA: Diagnosis not present

## 2018-11-15 DIAGNOSIS — R1310 Dysphagia, unspecified: Secondary | ICD-10-CM | POA: Diagnosis not present

## 2018-11-16 ENCOUNTER — Other Ambulatory Visit: Payer: Self-pay | Admitting: *Deleted

## 2018-11-16 ENCOUNTER — Telehealth: Payer: Self-pay | Admitting: *Deleted

## 2018-11-16 DIAGNOSIS — E46 Unspecified protein-calorie malnutrition: Secondary | ICD-10-CM | POA: Diagnosis not present

## 2018-11-16 DIAGNOSIS — I509 Heart failure, unspecified: Secondary | ICD-10-CM | POA: Diagnosis not present

## 2018-11-16 DIAGNOSIS — G9009 Other idiopathic peripheral autonomic neuropathy: Secondary | ICD-10-CM | POA: Diagnosis not present

## 2018-11-16 DIAGNOSIS — I679 Cerebrovascular disease, unspecified: Secondary | ICD-10-CM | POA: Diagnosis not present

## 2018-11-16 DIAGNOSIS — I4891 Unspecified atrial fibrillation: Secondary | ICD-10-CM | POA: Diagnosis not present

## 2018-11-16 DIAGNOSIS — I739 Peripheral vascular disease, unspecified: Secondary | ICD-10-CM | POA: Diagnosis not present

## 2018-11-16 MED ORDER — ZINC OXIDE 13 % EX CREA: TOPICAL_CREAM | CUTANEOUS | 12 refills | Status: AC

## 2018-11-16 NOTE — Telephone Encounter (Signed)
Refills sent to pharm

## 2018-11-16 NOTE — Telephone Encounter (Signed)
Fax from [facility] requesting refills on desitin 13% cream. Apply to affected areas every day as needed for rash. Requesting additional refills on this as well.

## 2018-11-16 NOTE — Telephone Encounter (Signed)
Sure 12 ref

## 2018-11-17 DIAGNOSIS — I739 Peripheral vascular disease, unspecified: Secondary | ICD-10-CM | POA: Diagnosis not present

## 2018-11-17 DIAGNOSIS — G9009 Other idiopathic peripheral autonomic neuropathy: Secondary | ICD-10-CM | POA: Diagnosis not present

## 2018-11-17 DIAGNOSIS — I679 Cerebrovascular disease, unspecified: Secondary | ICD-10-CM | POA: Diagnosis not present

## 2018-11-17 DIAGNOSIS — I509 Heart failure, unspecified: Secondary | ICD-10-CM | POA: Diagnosis not present

## 2018-11-17 DIAGNOSIS — I4891 Unspecified atrial fibrillation: Secondary | ICD-10-CM | POA: Diagnosis not present

## 2018-11-17 DIAGNOSIS — E46 Unspecified protein-calorie malnutrition: Secondary | ICD-10-CM | POA: Diagnosis not present

## 2018-11-20 DIAGNOSIS — I739 Peripheral vascular disease, unspecified: Secondary | ICD-10-CM | POA: Diagnosis not present

## 2018-11-20 DIAGNOSIS — I4891 Unspecified atrial fibrillation: Secondary | ICD-10-CM | POA: Diagnosis not present

## 2018-11-20 DIAGNOSIS — G9009 Other idiopathic peripheral autonomic neuropathy: Secondary | ICD-10-CM | POA: Diagnosis not present

## 2018-11-20 DIAGNOSIS — E46 Unspecified protein-calorie malnutrition: Secondary | ICD-10-CM | POA: Diagnosis not present

## 2018-11-20 DIAGNOSIS — I509 Heart failure, unspecified: Secondary | ICD-10-CM | POA: Diagnosis not present

## 2018-11-20 DIAGNOSIS — I679 Cerebrovascular disease, unspecified: Secondary | ICD-10-CM | POA: Diagnosis not present

## 2018-11-21 DIAGNOSIS — I4891 Unspecified atrial fibrillation: Secondary | ICD-10-CM | POA: Diagnosis not present

## 2018-11-21 DIAGNOSIS — G9009 Other idiopathic peripheral autonomic neuropathy: Secondary | ICD-10-CM | POA: Diagnosis not present

## 2018-11-21 DIAGNOSIS — I679 Cerebrovascular disease, unspecified: Secondary | ICD-10-CM | POA: Diagnosis not present

## 2018-11-21 DIAGNOSIS — E46 Unspecified protein-calorie malnutrition: Secondary | ICD-10-CM | POA: Diagnosis not present

## 2018-11-21 DIAGNOSIS — I509 Heart failure, unspecified: Secondary | ICD-10-CM | POA: Diagnosis not present

## 2018-11-21 DIAGNOSIS — I739 Peripheral vascular disease, unspecified: Secondary | ICD-10-CM | POA: Diagnosis not present

## 2018-11-22 DIAGNOSIS — E46 Unspecified protein-calorie malnutrition: Secondary | ICD-10-CM | POA: Diagnosis not present

## 2018-11-22 DIAGNOSIS — I679 Cerebrovascular disease, unspecified: Secondary | ICD-10-CM | POA: Diagnosis not present

## 2018-11-22 DIAGNOSIS — I4891 Unspecified atrial fibrillation: Secondary | ICD-10-CM | POA: Diagnosis not present

## 2018-11-22 DIAGNOSIS — I509 Heart failure, unspecified: Secondary | ICD-10-CM | POA: Diagnosis not present

## 2018-11-22 DIAGNOSIS — G9009 Other idiopathic peripheral autonomic neuropathy: Secondary | ICD-10-CM | POA: Diagnosis not present

## 2018-11-22 DIAGNOSIS — I739 Peripheral vascular disease, unspecified: Secondary | ICD-10-CM | POA: Diagnosis not present

## 2018-11-23 DIAGNOSIS — L84 Corns and callosities: Secondary | ICD-10-CM | POA: Diagnosis not present

## 2018-11-23 DIAGNOSIS — E1151 Type 2 diabetes mellitus with diabetic peripheral angiopathy without gangrene: Secondary | ICD-10-CM | POA: Diagnosis not present

## 2018-11-24 DIAGNOSIS — I679 Cerebrovascular disease, unspecified: Secondary | ICD-10-CM | POA: Diagnosis not present

## 2018-11-24 DIAGNOSIS — I4891 Unspecified atrial fibrillation: Secondary | ICD-10-CM | POA: Diagnosis not present

## 2018-11-24 DIAGNOSIS — I739 Peripheral vascular disease, unspecified: Secondary | ICD-10-CM | POA: Diagnosis not present

## 2018-11-24 DIAGNOSIS — G9009 Other idiopathic peripheral autonomic neuropathy: Secondary | ICD-10-CM | POA: Diagnosis not present

## 2018-11-24 DIAGNOSIS — E46 Unspecified protein-calorie malnutrition: Secondary | ICD-10-CM | POA: Diagnosis not present

## 2018-11-24 DIAGNOSIS — I509 Heart failure, unspecified: Secondary | ICD-10-CM | POA: Diagnosis not present

## 2018-11-27 DIAGNOSIS — I679 Cerebrovascular disease, unspecified: Secondary | ICD-10-CM | POA: Diagnosis not present

## 2018-11-27 DIAGNOSIS — I4891 Unspecified atrial fibrillation: Secondary | ICD-10-CM | POA: Diagnosis not present

## 2018-11-27 DIAGNOSIS — G9009 Other idiopathic peripheral autonomic neuropathy: Secondary | ICD-10-CM | POA: Diagnosis not present

## 2018-11-27 DIAGNOSIS — I739 Peripheral vascular disease, unspecified: Secondary | ICD-10-CM | POA: Diagnosis not present

## 2018-11-27 DIAGNOSIS — E46 Unspecified protein-calorie malnutrition: Secondary | ICD-10-CM | POA: Diagnosis not present

## 2018-11-27 DIAGNOSIS — I509 Heart failure, unspecified: Secondary | ICD-10-CM | POA: Diagnosis not present

## 2018-11-28 DIAGNOSIS — G9009 Other idiopathic peripheral autonomic neuropathy: Secondary | ICD-10-CM | POA: Diagnosis not present

## 2018-11-28 DIAGNOSIS — I509 Heart failure, unspecified: Secondary | ICD-10-CM | POA: Diagnosis not present

## 2018-11-28 DIAGNOSIS — I739 Peripheral vascular disease, unspecified: Secondary | ICD-10-CM | POA: Diagnosis not present

## 2018-11-28 DIAGNOSIS — I679 Cerebrovascular disease, unspecified: Secondary | ICD-10-CM | POA: Diagnosis not present

## 2018-11-28 DIAGNOSIS — E46 Unspecified protein-calorie malnutrition: Secondary | ICD-10-CM | POA: Diagnosis not present

## 2018-11-28 DIAGNOSIS — I4891 Unspecified atrial fibrillation: Secondary | ICD-10-CM | POA: Diagnosis not present

## 2018-11-29 ENCOUNTER — Encounter: Payer: Self-pay | Admitting: Family Medicine

## 2018-11-29 DIAGNOSIS — I739 Peripheral vascular disease, unspecified: Secondary | ICD-10-CM | POA: Diagnosis not present

## 2018-11-29 DIAGNOSIS — I679 Cerebrovascular disease, unspecified: Secondary | ICD-10-CM | POA: Diagnosis not present

## 2018-11-29 DIAGNOSIS — E46 Unspecified protein-calorie malnutrition: Secondary | ICD-10-CM | POA: Diagnosis not present

## 2018-11-29 DIAGNOSIS — G9009 Other idiopathic peripheral autonomic neuropathy: Secondary | ICD-10-CM | POA: Diagnosis not present

## 2018-11-29 DIAGNOSIS — I509 Heart failure, unspecified: Secondary | ICD-10-CM | POA: Diagnosis not present

## 2018-11-29 DIAGNOSIS — R6889 Other general symptoms and signs: Secondary | ICD-10-CM | POA: Diagnosis not present

## 2018-11-29 DIAGNOSIS — I4891 Unspecified atrial fibrillation: Secondary | ICD-10-CM | POA: Diagnosis not present

## 2018-12-01 DIAGNOSIS — I679 Cerebrovascular disease, unspecified: Secondary | ICD-10-CM | POA: Diagnosis not present

## 2018-12-01 DIAGNOSIS — I739 Peripheral vascular disease, unspecified: Secondary | ICD-10-CM | POA: Diagnosis not present

## 2018-12-01 DIAGNOSIS — I4891 Unspecified atrial fibrillation: Secondary | ICD-10-CM | POA: Diagnosis not present

## 2018-12-01 DIAGNOSIS — E46 Unspecified protein-calorie malnutrition: Secondary | ICD-10-CM | POA: Diagnosis not present

## 2018-12-01 DIAGNOSIS — G9009 Other idiopathic peripheral autonomic neuropathy: Secondary | ICD-10-CM | POA: Diagnosis not present

## 2018-12-01 DIAGNOSIS — I509 Heart failure, unspecified: Secondary | ICD-10-CM | POA: Diagnosis not present

## 2018-12-04 DIAGNOSIS — I739 Peripheral vascular disease, unspecified: Secondary | ICD-10-CM | POA: Diagnosis not present

## 2018-12-04 DIAGNOSIS — I679 Cerebrovascular disease, unspecified: Secondary | ICD-10-CM | POA: Diagnosis not present

## 2018-12-04 DIAGNOSIS — G9009 Other idiopathic peripheral autonomic neuropathy: Secondary | ICD-10-CM | POA: Diagnosis not present

## 2018-12-04 DIAGNOSIS — E46 Unspecified protein-calorie malnutrition: Secondary | ICD-10-CM | POA: Diagnosis not present

## 2018-12-04 DIAGNOSIS — I4891 Unspecified atrial fibrillation: Secondary | ICD-10-CM | POA: Diagnosis not present

## 2018-12-04 DIAGNOSIS — I509 Heart failure, unspecified: Secondary | ICD-10-CM | POA: Diagnosis not present

## 2018-12-05 DIAGNOSIS — I739 Peripheral vascular disease, unspecified: Secondary | ICD-10-CM | POA: Diagnosis not present

## 2018-12-05 DIAGNOSIS — I4891 Unspecified atrial fibrillation: Secondary | ICD-10-CM | POA: Diagnosis not present

## 2018-12-05 DIAGNOSIS — I509 Heart failure, unspecified: Secondary | ICD-10-CM | POA: Diagnosis not present

## 2018-12-05 DIAGNOSIS — G9009 Other idiopathic peripheral autonomic neuropathy: Secondary | ICD-10-CM | POA: Diagnosis not present

## 2018-12-05 DIAGNOSIS — I679 Cerebrovascular disease, unspecified: Secondary | ICD-10-CM | POA: Diagnosis not present

## 2018-12-05 DIAGNOSIS — E46 Unspecified protein-calorie malnutrition: Secondary | ICD-10-CM | POA: Diagnosis not present

## 2018-12-06 DIAGNOSIS — I4891 Unspecified atrial fibrillation: Secondary | ICD-10-CM | POA: Diagnosis not present

## 2018-12-06 DIAGNOSIS — E46 Unspecified protein-calorie malnutrition: Secondary | ICD-10-CM | POA: Diagnosis not present

## 2018-12-06 DIAGNOSIS — G9009 Other idiopathic peripheral autonomic neuropathy: Secondary | ICD-10-CM | POA: Diagnosis not present

## 2018-12-06 DIAGNOSIS — I679 Cerebrovascular disease, unspecified: Secondary | ICD-10-CM | POA: Diagnosis not present

## 2018-12-06 DIAGNOSIS — I739 Peripheral vascular disease, unspecified: Secondary | ICD-10-CM | POA: Diagnosis not present

## 2018-12-06 DIAGNOSIS — I509 Heart failure, unspecified: Secondary | ICD-10-CM | POA: Diagnosis not present

## 2018-12-08 ENCOUNTER — Telehealth: Payer: Self-pay | Admitting: Family Medicine

## 2018-12-08 ENCOUNTER — Ambulatory Visit: Payer: Medicare Other | Admitting: Family Medicine

## 2018-12-08 DIAGNOSIS — E46 Unspecified protein-calorie malnutrition: Secondary | ICD-10-CM | POA: Diagnosis not present

## 2018-12-08 DIAGNOSIS — I739 Peripheral vascular disease, unspecified: Secondary | ICD-10-CM | POA: Diagnosis not present

## 2018-12-08 DIAGNOSIS — I509 Heart failure, unspecified: Secondary | ICD-10-CM | POA: Diagnosis not present

## 2018-12-08 DIAGNOSIS — G9009 Other idiopathic peripheral autonomic neuropathy: Secondary | ICD-10-CM | POA: Diagnosis not present

## 2018-12-08 DIAGNOSIS — I4891 Unspecified atrial fibrillation: Secondary | ICD-10-CM | POA: Diagnosis not present

## 2018-12-08 DIAGNOSIS — I679 Cerebrovascular disease, unspecified: Secondary | ICD-10-CM | POA: Diagnosis not present

## 2018-12-08 NOTE — Telephone Encounter (Signed)
i'll come by six or so. Have the facility have chart and recent vitals and orders sheet waiting in room plz

## 2018-12-08 NOTE — Telephone Encounter (Signed)
Temp 99 maybe a little higher. Has been lying in the bed and not wanting to get up. Today is the 3rd day. Eating very little, drinks a little, pain in feet and knees and has a headache. Giving tylenol. Coughing up yellow mucus. No sob.

## 2018-12-08 NOTE — Telephone Encounter (Signed)
Mrs. Leonides Schanz is calling to make Dr. Richardson Landry aware that Christian Thomas has had a fever on and off for the past few days, been in bed the majority of the time and has not beeing eating much.    Also she thinks it is getting close to the 3 month mark since he has had injections in his knees.   CB# [id number].

## 2018-12-08 NOTE — Telephone Encounter (Signed)
Called [facility] and asked that they have his chart, recent vitals and order form in his room when dr Richardson Landry comes by tonight around 6 or so. Tried to call [name] back and no answer times 3. Voicemail not set up on either number in chart for [facility]. Will try to call again before we leave today.

## 2018-12-08 NOTE — Telephone Encounter (Signed)
[name] was notified that dr Richardson Landry is coming by tonight around 6 or so.

## 2018-12-09 DIAGNOSIS — E46 Unspecified protein-calorie malnutrition: Secondary | ICD-10-CM | POA: Diagnosis not present

## 2018-12-09 DIAGNOSIS — G9009 Other idiopathic peripheral autonomic neuropathy: Secondary | ICD-10-CM | POA: Diagnosis not present

## 2018-12-09 DIAGNOSIS — I4891 Unspecified atrial fibrillation: Secondary | ICD-10-CM | POA: Diagnosis not present

## 2018-12-09 DIAGNOSIS — I509 Heart failure, unspecified: Secondary | ICD-10-CM | POA: Diagnosis not present

## 2018-12-09 DIAGNOSIS — I679 Cerebrovascular disease, unspecified: Secondary | ICD-10-CM | POA: Diagnosis not present

## 2018-12-09 DIAGNOSIS — I739 Peripheral vascular disease, unspecified: Secondary | ICD-10-CM | POA: Diagnosis not present

## 2018-12-11 ENCOUNTER — Other Ambulatory Visit (HOSPITAL_COMMUNITY)
Admission: RE | Admit: 2018-12-11 | Discharge: 2018-12-11 | Disposition: A | Discharge status: Home / Self Care | Source: Ambulatory Visit | Attending: Family Medicine | Admitting: Family Medicine

## 2018-12-11 ENCOUNTER — Ambulatory Visit: Payer: Medicare Other | Admitting: Family Medicine

## 2018-12-11 DIAGNOSIS — I4891 Unspecified atrial fibrillation: Secondary | ICD-10-CM | POA: Diagnosis not present

## 2018-12-11 DIAGNOSIS — I509 Heart failure, unspecified: Secondary | ICD-10-CM | POA: Diagnosis not present

## 2018-12-11 DIAGNOSIS — I679 Cerebrovascular disease, unspecified: Secondary | ICD-10-CM | POA: Diagnosis not present

## 2018-12-11 DIAGNOSIS — I739 Peripheral vascular disease, unspecified: Secondary | ICD-10-CM | POA: Diagnosis not present

## 2018-12-11 DIAGNOSIS — G9009 Other idiopathic peripheral autonomic neuropathy: Secondary | ICD-10-CM | POA: Diagnosis not present

## 2018-12-11 DIAGNOSIS — N390 Urinary tract infection, site not specified: Secondary | ICD-10-CM | POA: Insufficient documentation

## 2018-12-11 DIAGNOSIS — E46 Unspecified protein-calorie malnutrition: Secondary | ICD-10-CM | POA: Diagnosis not present

## 2018-12-11 LAB — URINALYSIS, ROUTINE W REFLEX MICROSCOPIC
BILIRUBIN URINE: NEGATIVE
Bacteria, UA: NONE SEEN
GLUCOSE, UA: NEGATIVE mg/dL
HGB URINE DIPSTICK: NEGATIVE
Ketones, ur: NEGATIVE mg/dL
NITRITE: NEGATIVE
PH: 5 (ref 5.0–8.0)
Protein, ur: 30 mg/dL — AB
SPECIFIC GRAVITY, URINE: 1.025 (ref 1.005–1.030)

## 2018-12-12 DIAGNOSIS — G9009 Other idiopathic peripheral autonomic neuropathy: Secondary | ICD-10-CM | POA: Diagnosis not present

## 2018-12-12 DIAGNOSIS — I4891 Unspecified atrial fibrillation: Secondary | ICD-10-CM | POA: Diagnosis not present

## 2018-12-12 DIAGNOSIS — I509 Heart failure, unspecified: Secondary | ICD-10-CM | POA: Diagnosis not present

## 2018-12-12 DIAGNOSIS — I679 Cerebrovascular disease, unspecified: Secondary | ICD-10-CM | POA: Diagnosis not present

## 2018-12-12 DIAGNOSIS — E46 Unspecified protein-calorie malnutrition: Secondary | ICD-10-CM | POA: Diagnosis not present

## 2018-12-12 DIAGNOSIS — I739 Peripheral vascular disease, unspecified: Secondary | ICD-10-CM | POA: Diagnosis not present

## 2018-12-12 LAB — URINE CULTURE: Culture: <10000 — AB

## 2018-12-13 DIAGNOSIS — I4891 Unspecified atrial fibrillation: Secondary | ICD-10-CM | POA: Diagnosis not present

## 2018-12-13 DIAGNOSIS — G9009 Other idiopathic peripheral autonomic neuropathy: Secondary | ICD-10-CM | POA: Diagnosis not present

## 2018-12-13 DIAGNOSIS — I739 Peripheral vascular disease, unspecified: Secondary | ICD-10-CM | POA: Diagnosis not present

## 2018-12-13 DIAGNOSIS — I509 Heart failure, unspecified: Secondary | ICD-10-CM | POA: Diagnosis not present

## 2018-12-13 DIAGNOSIS — E46 Unspecified protein-calorie malnutrition: Secondary | ICD-10-CM | POA: Diagnosis not present

## 2018-12-13 DIAGNOSIS — I679 Cerebrovascular disease, unspecified: Secondary | ICD-10-CM | POA: Diagnosis not present

## 2018-12-15 DIAGNOSIS — I509 Heart failure, unspecified: Secondary | ICD-10-CM | POA: Diagnosis not present

## 2018-12-15 DIAGNOSIS — I679 Cerebrovascular disease, unspecified: Secondary | ICD-10-CM | POA: Diagnosis not present

## 2018-12-15 DIAGNOSIS — I739 Peripheral vascular disease, unspecified: Secondary | ICD-10-CM | POA: Diagnosis not present

## 2018-12-15 DIAGNOSIS — E46 Unspecified protein-calorie malnutrition: Secondary | ICD-10-CM | POA: Diagnosis not present

## 2018-12-15 DIAGNOSIS — G9009 Other idiopathic peripheral autonomic neuropathy: Secondary | ICD-10-CM | POA: Diagnosis not present

## 2018-12-15 DIAGNOSIS — I4891 Unspecified atrial fibrillation: Secondary | ICD-10-CM | POA: Diagnosis not present

## 2018-12-16 DIAGNOSIS — J309 Allergic rhinitis, unspecified: Secondary | ICD-10-CM | POA: Diagnosis not present

## 2018-12-16 DIAGNOSIS — M109 Gout, unspecified: Secondary | ICD-10-CM | POA: Diagnosis not present

## 2018-12-16 DIAGNOSIS — R634 Abnormal weight loss: Secondary | ICD-10-CM | POA: Diagnosis not present

## 2018-12-16 DIAGNOSIS — I739 Peripheral vascular disease, unspecified: Secondary | ICD-10-CM | POA: Diagnosis not present

## 2018-12-16 DIAGNOSIS — E46 Unspecified protein-calorie malnutrition: Secondary | ICD-10-CM | POA: Diagnosis not present

## 2018-12-16 DIAGNOSIS — R1310 Dysphagia, unspecified: Secondary | ICD-10-CM | POA: Diagnosis not present

## 2018-12-16 DIAGNOSIS — I10 Essential (primary) hypertension: Secondary | ICD-10-CM | POA: Diagnosis not present

## 2018-12-16 DIAGNOSIS — R54 Age-related physical debility: Secondary | ICD-10-CM | POA: Diagnosis not present

## 2018-12-16 DIAGNOSIS — E785 Hyperlipidemia, unspecified: Secondary | ICD-10-CM | POA: Diagnosis not present

## 2018-12-16 DIAGNOSIS — E1159 Type 2 diabetes mellitus with other circulatory complications: Secondary | ICD-10-CM | POA: Diagnosis not present

## 2018-12-16 DIAGNOSIS — G9009 Other idiopathic peripheral autonomic neuropathy: Secondary | ICD-10-CM | POA: Diagnosis not present

## 2018-12-16 DIAGNOSIS — N401 Enlarged prostate with lower urinary tract symptoms: Secondary | ICD-10-CM | POA: Diagnosis not present

## 2018-12-16 DIAGNOSIS — I509 Heart failure, unspecified: Secondary | ICD-10-CM | POA: Diagnosis not present

## 2018-12-16 DIAGNOSIS — I679 Cerebrovascular disease, unspecified: Secondary | ICD-10-CM | POA: Diagnosis not present

## 2018-12-16 DIAGNOSIS — I4891 Unspecified atrial fibrillation: Secondary | ICD-10-CM | POA: Diagnosis not present

## 2018-12-17 DIAGNOSIS — G9009 Other idiopathic peripheral autonomic neuropathy: Secondary | ICD-10-CM | POA: Diagnosis not present

## 2018-12-17 DIAGNOSIS — I509 Heart failure, unspecified: Secondary | ICD-10-CM | POA: Diagnosis not present

## 2018-12-17 DIAGNOSIS — I739 Peripheral vascular disease, unspecified: Secondary | ICD-10-CM | POA: Diagnosis not present

## 2018-12-17 DIAGNOSIS — I679 Cerebrovascular disease, unspecified: Secondary | ICD-10-CM | POA: Diagnosis not present

## 2018-12-17 DIAGNOSIS — I4891 Unspecified atrial fibrillation: Secondary | ICD-10-CM | POA: Diagnosis not present

## 2018-12-17 DIAGNOSIS — E46 Unspecified protein-calorie malnutrition: Secondary | ICD-10-CM | POA: Diagnosis not present

## 2018-12-18 DIAGNOSIS — G9009 Other idiopathic peripheral autonomic neuropathy: Secondary | ICD-10-CM | POA: Diagnosis not present

## 2018-12-18 DIAGNOSIS — I679 Cerebrovascular disease, unspecified: Secondary | ICD-10-CM | POA: Diagnosis not present

## 2018-12-18 DIAGNOSIS — I4891 Unspecified atrial fibrillation: Secondary | ICD-10-CM | POA: Diagnosis not present

## 2018-12-18 DIAGNOSIS — I509 Heart failure, unspecified: Secondary | ICD-10-CM | POA: Diagnosis not present

## 2018-12-18 DIAGNOSIS — I739 Peripheral vascular disease, unspecified: Secondary | ICD-10-CM | POA: Diagnosis not present

## 2018-12-18 DIAGNOSIS — E46 Unspecified protein-calorie malnutrition: Secondary | ICD-10-CM | POA: Diagnosis not present

## 2018-12-19 ENCOUNTER — Ambulatory Visit: Payer: Medicare Other | Admitting: Family Medicine

## 2018-12-19 DIAGNOSIS — I10 Essential (primary) hypertension: Secondary | ICD-10-CM

## 2018-12-19 DIAGNOSIS — E118 Type 2 diabetes mellitus with unspecified complications: Secondary | ICD-10-CM | POA: Diagnosis not present

## 2018-12-19 DIAGNOSIS — R531 Weakness: Secondary | ICD-10-CM | POA: Diagnosis not present

## 2018-12-19 DIAGNOSIS — J189 Pneumonia, unspecified organism: Secondary | ICD-10-CM

## 2018-12-20 ENCOUNTER — Telehealth: Payer: Self-pay | Admitting: *Deleted

## 2018-12-20 DIAGNOSIS — I679 Cerebrovascular disease, unspecified: Secondary | ICD-10-CM | POA: Diagnosis not present

## 2018-12-20 DIAGNOSIS — E46 Unspecified protein-calorie malnutrition: Secondary | ICD-10-CM | POA: Diagnosis not present

## 2018-12-20 DIAGNOSIS — G9009 Other idiopathic peripheral autonomic neuropathy: Secondary | ICD-10-CM | POA: Diagnosis not present

## 2018-12-20 DIAGNOSIS — I739 Peripheral vascular disease, unspecified: Secondary | ICD-10-CM | POA: Diagnosis not present

## 2018-12-20 DIAGNOSIS — I509 Heart failure, unspecified: Secondary | ICD-10-CM | POA: Diagnosis not present

## 2018-12-20 DIAGNOSIS — I4891 Unspecified atrial fibrillation: Secondary | ICD-10-CM | POA: Diagnosis not present

## 2018-12-20 NOTE — Telephone Encounter (Signed)
Message for Dr Richardson Landry

## 2018-12-20 NOTE — Telephone Encounter (Signed)
Verbal order for PT evaluation given to [place] at [facility].

## 2018-12-20 NOTE — Telephone Encounter (Signed)
Pt specifically requested physical therapy referral, I am not sure whether this will help or not, but lets make a request for evaluation to see

## 2018-12-22 DIAGNOSIS — G9009 Other idiopathic peripheral autonomic neuropathy: Secondary | ICD-10-CM | POA: Diagnosis not present

## 2018-12-22 DIAGNOSIS — I679 Cerebrovascular disease, unspecified: Secondary | ICD-10-CM | POA: Diagnosis not present

## 2018-12-22 DIAGNOSIS — I509 Heart failure, unspecified: Secondary | ICD-10-CM | POA: Diagnosis not present

## 2018-12-22 DIAGNOSIS — I739 Peripheral vascular disease, unspecified: Secondary | ICD-10-CM | POA: Diagnosis not present

## 2018-12-22 DIAGNOSIS — I4891 Unspecified atrial fibrillation: Secondary | ICD-10-CM | POA: Diagnosis not present

## 2018-12-22 DIAGNOSIS — E46 Unspecified protein-calorie malnutrition: Secondary | ICD-10-CM | POA: Diagnosis not present

## 2018-12-23 DIAGNOSIS — I679 Cerebrovascular disease, unspecified: Secondary | ICD-10-CM | POA: Diagnosis not present

## 2018-12-23 DIAGNOSIS — I4891 Unspecified atrial fibrillation: Secondary | ICD-10-CM | POA: Diagnosis not present

## 2018-12-23 DIAGNOSIS — G9009 Other idiopathic peripheral autonomic neuropathy: Secondary | ICD-10-CM | POA: Diagnosis not present

## 2018-12-23 DIAGNOSIS — E46 Unspecified protein-calorie malnutrition: Secondary | ICD-10-CM | POA: Diagnosis not present

## 2018-12-23 DIAGNOSIS — I739 Peripheral vascular disease, unspecified: Secondary | ICD-10-CM | POA: Diagnosis not present

## 2018-12-23 DIAGNOSIS — I509 Heart failure, unspecified: Secondary | ICD-10-CM | POA: Diagnosis not present

## 2018-12-24 NOTE — Progress Notes (Signed)
Patient seen at nursing center.  Primarily seen sooner than anticipated because of note from hospice nurses.  They felt based on evaluation yesterday the patient has slighted into further deterioration and is at risk of potential even impending death.  Today caretakers report improved appetite.  Patient no longer walking.  Finishing up antibiotics for presumed chest infection.  Ongoing cough.  Minimal wheezing.  Vital signs stable  Patient baseline alertness.  Very frail appearing.  Pleasant.  Smiling.  Interactive.  Challenged by hard of hearing lungs no crackles no tachypnea intermittent bronchial cough.  No wheezes currently heart regular rate and rhythm abdomen soft scaphoid adulterants cachectic  Impression respiratory infection.  Clinically stable but extremely fragile.  Discussed.  Maintain albuterol.  Maintain oral antibiotics  2.  General frailty worsening month-to-month.  Patient has approached the end of his life.  However he has surprises in the past discussed with family  We will follow-up regular appointment.  40 minutes spent with patient and caretaker and family members.

## 2018-12-25 DIAGNOSIS — E46 Unspecified protein-calorie malnutrition: Secondary | ICD-10-CM | POA: Diagnosis not present

## 2018-12-25 DIAGNOSIS — I739 Peripheral vascular disease, unspecified: Secondary | ICD-10-CM | POA: Diagnosis not present

## 2018-12-25 DIAGNOSIS — I509 Heart failure, unspecified: Secondary | ICD-10-CM | POA: Diagnosis not present

## 2018-12-25 DIAGNOSIS — G9009 Other idiopathic peripheral autonomic neuropathy: Secondary | ICD-10-CM | POA: Diagnosis not present

## 2018-12-25 DIAGNOSIS — I679 Cerebrovascular disease, unspecified: Secondary | ICD-10-CM | POA: Diagnosis not present

## 2018-12-25 DIAGNOSIS — I4891 Unspecified atrial fibrillation: Secondary | ICD-10-CM | POA: Diagnosis not present

## 2018-12-26 DIAGNOSIS — I679 Cerebrovascular disease, unspecified: Secondary | ICD-10-CM | POA: Diagnosis not present

## 2018-12-26 DIAGNOSIS — G9009 Other idiopathic peripheral autonomic neuropathy: Secondary | ICD-10-CM | POA: Diagnosis not present

## 2018-12-26 DIAGNOSIS — I739 Peripheral vascular disease, unspecified: Secondary | ICD-10-CM | POA: Diagnosis not present

## 2018-12-26 DIAGNOSIS — I509 Heart failure, unspecified: Secondary | ICD-10-CM | POA: Diagnosis not present

## 2018-12-26 DIAGNOSIS — I4891 Unspecified atrial fibrillation: Secondary | ICD-10-CM | POA: Diagnosis not present

## 2018-12-26 DIAGNOSIS — E46 Unspecified protein-calorie malnutrition: Secondary | ICD-10-CM | POA: Diagnosis not present

## 2018-12-27 ENCOUNTER — Telehealth: Payer: Self-pay | Admitting: Family Medicine

## 2018-12-27 DIAGNOSIS — G9009 Other idiopathic peripheral autonomic neuropathy: Secondary | ICD-10-CM | POA: Diagnosis not present

## 2018-12-27 DIAGNOSIS — I4891 Unspecified atrial fibrillation: Secondary | ICD-10-CM | POA: Diagnosis not present

## 2018-12-27 DIAGNOSIS — I509 Heart failure, unspecified: Secondary | ICD-10-CM | POA: Diagnosis not present

## 2018-12-27 DIAGNOSIS — E46 Unspecified protein-calorie malnutrition: Secondary | ICD-10-CM | POA: Diagnosis not present

## 2018-12-27 DIAGNOSIS — I739 Peripheral vascular disease, unspecified: Secondary | ICD-10-CM | POA: Diagnosis not present

## 2018-12-27 DIAGNOSIS — I679 Cerebrovascular disease, unspecified: Secondary | ICD-10-CM | POA: Diagnosis not present

## 2018-12-28 DIAGNOSIS — I679 Cerebrovascular disease, unspecified: Secondary | ICD-10-CM | POA: Diagnosis not present

## 2018-12-28 DIAGNOSIS — I509 Heart failure, unspecified: Secondary | ICD-10-CM | POA: Diagnosis not present

## 2018-12-28 DIAGNOSIS — I4891 Unspecified atrial fibrillation: Secondary | ICD-10-CM | POA: Diagnosis not present

## 2018-12-28 DIAGNOSIS — I739 Peripheral vascular disease, unspecified: Secondary | ICD-10-CM | POA: Diagnosis not present

## 2018-12-28 DIAGNOSIS — G9009 Other idiopathic peripheral autonomic neuropathy: Secondary | ICD-10-CM | POA: Diagnosis not present

## 2018-12-28 DIAGNOSIS — E46 Unspecified protein-calorie malnutrition: Secondary | ICD-10-CM | POA: Diagnosis not present

## 2019-01-14 NOTE — Telephone Encounter (Signed)
Hospice nurse calling to inform us that patient is transitioning to end of life and becoming more restless and agitated. Pt has had scheduled dose of Xanax and the PRN order. Hospice nurse wanted to know if provider could help with something to help with restless/agitation. Please advise. Thank you

## 2019-01-14 DEATH — deceased
